# Patient Record
Sex: Female | Born: 1950 | Race: Black or African American | Hispanic: No | Smoking: Former smoker
Health system: Southern US, Community
[De-identification: ages and names within clinical notes are randomized; demographics above are authoritative.]

## PROBLEM LIST (undated history)

## (undated) DIAGNOSIS — J449 Chronic obstructive pulmonary disease, unspecified: Secondary | ICD-10-CM

## (undated) DIAGNOSIS — J45909 Unspecified asthma, uncomplicated: Secondary | ICD-10-CM

## (undated) DIAGNOSIS — E7800 Pure hypercholesterolemia, unspecified: Secondary | ICD-10-CM

## (undated) DIAGNOSIS — E119 Type 2 diabetes mellitus without complications: Secondary | ICD-10-CM

## (undated) DIAGNOSIS — M359 Systemic involvement of connective tissue, unspecified: Secondary | ICD-10-CM

## (undated) DIAGNOSIS — Z862 Personal history of diseases of the blood and blood-forming organs and certain disorders involving the immune mechanism: Secondary | ICD-10-CM

## (undated) HISTORY — PX: APPENDECTOMY: SHX54

## (undated) HISTORY — PX: OTHER SURGICAL HISTORY: SHX169

## (undated) HISTORY — PX: ABDOMINAL HYSTERECTOMY: SHX81

## (undated) HISTORY — PX: CHOLECYSTECTOMY: SHX55

---

## 2009-01-27 ENCOUNTER — Ambulatory Visit (HOSPITAL_BASED_OUTPATIENT_CLINIC_OR_DEPARTMENT_OTHER): Admission: RE | Admit: 2009-01-27 | Discharge: 2009-01-27 | Payer: Self-pay | Admitting: Family Medicine

## 2009-01-27 ENCOUNTER — Ambulatory Visit: Payer: Self-pay | Admitting: Radiology

## 2013-11-19 ENCOUNTER — Other Ambulatory Visit: Payer: Self-pay | Admitting: Hematology and Oncology

## 2013-11-19 DIAGNOSIS — N644 Mastodynia: Secondary | ICD-10-CM

## 2013-11-26 ENCOUNTER — Ambulatory Visit
Admission: RE | Admit: 2013-11-26 | Discharge: 2013-11-26 | Disposition: A | Discharge status: Home / Self Care | Payer: Medicare Other | Source: Ambulatory Visit | Attending: Hematology and Oncology | Admitting: Hematology and Oncology

## 2013-11-26 DIAGNOSIS — N644 Mastodynia: Secondary | ICD-10-CM

## 2016-08-14 ENCOUNTER — Encounter (HOSPITAL_BASED_OUTPATIENT_CLINIC_OR_DEPARTMENT_OTHER): Payer: Self-pay | Admitting: Emergency Medicine

## 2016-08-14 ENCOUNTER — Emergency Department (HOSPITAL_BASED_OUTPATIENT_CLINIC_OR_DEPARTMENT_OTHER)
Admission: EM | Admit: 2016-08-14 | Discharge: 2016-08-14 | Disposition: A | Discharge status: Home / Self Care | Payer: Medicare HMO | Attending: Emergency Medicine | Admitting: Emergency Medicine

## 2016-08-14 ENCOUNTER — Emergency Department (HOSPITAL_BASED_OUTPATIENT_CLINIC_OR_DEPARTMENT_OTHER): Payer: Medicare HMO

## 2016-08-14 DIAGNOSIS — M549 Dorsalgia, unspecified: Secondary | ICD-10-CM | POA: Diagnosis present

## 2016-08-14 DIAGNOSIS — E119 Type 2 diabetes mellitus without complications: Secondary | ICD-10-CM | POA: Insufficient documentation

## 2016-08-14 DIAGNOSIS — R6884 Jaw pain: Secondary | ICD-10-CM | POA: Insufficient documentation

## 2016-08-14 DIAGNOSIS — Z79899 Other long term (current) drug therapy: Secondary | ICD-10-CM | POA: Insufficient documentation

## 2016-08-14 DIAGNOSIS — M545 Low back pain, unspecified: Secondary | ICD-10-CM

## 2016-08-14 DIAGNOSIS — Z7984 Long term (current) use of oral hypoglycemic drugs: Secondary | ICD-10-CM | POA: Diagnosis not present

## 2016-08-14 DIAGNOSIS — G8929 Other chronic pain: Secondary | ICD-10-CM | POA: Insufficient documentation

## 2016-08-14 DIAGNOSIS — Z794 Long term (current) use of insulin: Secondary | ICD-10-CM | POA: Diagnosis not present

## 2016-08-14 DIAGNOSIS — M25561 Pain in right knee: Secondary | ICD-10-CM | POA: Insufficient documentation

## 2016-08-14 DIAGNOSIS — J449 Chronic obstructive pulmonary disease, unspecified: Secondary | ICD-10-CM | POA: Diagnosis not present

## 2016-08-14 DIAGNOSIS — R079 Chest pain, unspecified: Secondary | ICD-10-CM

## 2016-08-14 DIAGNOSIS — M25562 Pain in left knee: Secondary | ICD-10-CM | POA: Insufficient documentation

## 2016-08-14 HISTORY — DX: Chronic obstructive pulmonary disease, unspecified: J44.9

## 2016-08-14 HISTORY — DX: Pure hypercholesterolemia, unspecified: E78.00

## 2016-08-14 HISTORY — DX: Type 2 diabetes mellitus without complications: E11.9

## 2016-08-14 LAB — CBC
HCT: 40.6 % (ref 36.0–46.0)
HEMOGLOBIN: 12.2 g/dL (ref 12.0–15.0)
MCH: 23.3 pg — ABNORMAL LOW (ref 26.0–34.0)
MCHC: 30 g/dL (ref 30.0–36.0)
MCV: 77.5 fL — ABNORMAL LOW (ref 78.0–100.0)
Platelets: 239 10*3/uL (ref 150–400)
RBC: 5.24 MIL/uL — AB (ref 3.87–5.11)
RDW: 18.1 % — ABNORMAL HIGH (ref 11.5–15.5)
WBC: 12.8 10*3/uL — ABNORMAL HIGH (ref 4.0–10.5)

## 2016-08-14 LAB — COMPREHENSIVE METABOLIC PANEL
ALBUMIN: 3.1 g/dL — AB (ref 3.5–5.0)
ALK PHOS: 73 U/L (ref 38–126)
ALT: 12 U/L — AB (ref 14–54)
AST: 13 U/L — ABNORMAL LOW (ref 15–41)
Anion gap: 5 (ref 5–15)
BUN: 12 mg/dL (ref 6–20)
CALCIUM: 8.5 mg/dL — AB (ref 8.9–10.3)
CO2: 33 mmol/L — AB (ref 22–32)
CREATININE: 0.66 mg/dL (ref 0.44–1.00)
Chloride: 103 mmol/L (ref 101–111)
GFR calc Af Amer: >60 mL/min (ref >60)
GFR calc non Af Amer: >60 mL/min (ref >60)
GLUCOSE: 140 mg/dL — AB (ref 65–99)
Potassium: 4.3 mmol/L (ref 3.5–5.1)
SODIUM: 141 mmol/L (ref 135–145)
Total Bilirubin: 0.3 mg/dL (ref 0.3–1.2)
Total Protein: 6.3 g/dL — ABNORMAL LOW (ref 6.5–8.1)

## 2016-08-14 LAB — URINALYSIS, ROUTINE W REFLEX MICROSCOPIC
BILIRUBIN URINE: NEGATIVE
Glucose, UA: NEGATIVE mg/dL
HGB URINE DIPSTICK: NEGATIVE
Ketones, ur: NEGATIVE mg/dL
Leukocytes, UA: NEGATIVE
Nitrite: NEGATIVE
PH: 6.5 (ref 5.0–8.0)
Protein, ur: NEGATIVE mg/dL
SPECIFIC GRAVITY, URINE: 1.019 (ref 1.005–1.030)

## 2016-08-14 LAB — TROPONIN I
Troponin I: <0.03 ng/mL (ref <0.03)
Troponin I: <0.03 ng/mL (ref <0.03)

## 2016-08-14 MED ORDER — HYDROCODONE-ACETAMINOPHEN 5-325 MG PO TABS
1.0000 | ORAL_TABLET | Freq: Once | ORAL | Status: AC
Start: 2016-08-14 — End: 2016-08-14
  Administered 2016-08-14: 1 via ORAL
  Filled 2016-08-14: qty 1

## 2016-08-14 MED ORDER — KETOROLAC TROMETHAMINE 15 MG/ML IJ SOLN
15.0000 mg | Freq: Once | INTRAMUSCULAR | Status: AC
  Administered 2016-08-14: 15 mg via INTRAVENOUS
  Filled 2016-08-14: qty 1

## 2016-08-14 MED ORDER — METHOCARBAMOL 500 MG PO TABS
750.0000 mg | ORAL_TABLET | Freq: Once | ORAL | Status: AC
  Administered 2016-08-14: 750 mg via ORAL
  Filled 2016-08-14: qty 2

## 2016-08-14 NOTE — ED Notes (Signed)
Beside commode placed in room and pt given water to assist with encouragement of urine sample.  Family at bedside to assist patient in room.  Family will advise RN once sample provided.

## 2016-08-14 NOTE — ED Triage Notes (Signed)
Pt awoke from sleep this morning at approx 0230 with a feeling of tightness in her bilateral jaw, knees and lower back.  Pt states it feels like she just can't move them, "like they're locking up on me."

## 2016-08-14 NOTE — ED Notes (Signed)
Pt made aware to return if symptoms worsen or if any life threatening symptoms occur.   

## 2016-08-14 NOTE — ED Notes (Signed)
Pt advised that urine sample was needed for testing.  Pt states she is unable to give sample at present but will let staff know when she is able.

## 2016-08-14 NOTE — ED Provider Notes (Signed)
[facility] Provider Note   CSN: [id number] Arrival date & time: 08/14/16  5784     History   Chief Complaint Chief Complaint  Patient presents with  . Back Pain  . Muscle Pain    HPI Kelli Rivera is a 65 y.o. female.  The history is provided by the patient.  Back Pain   This is a new problem. Episode onset: Several years. The problem occurs constantly. The problem has not changed since onset.The pain is associated with no known injury. The pain is present in the lumbar spine. The quality of the pain is described as aching. The pain does not radiate. The pain is moderate. The symptoms are aggravated by bending, certain positions and twisting. Pertinent negatives include no chest pain, no fever, no weight loss, no headaches, no abdominal pain, no abdominal swelling, no bowel incontinence, no perianal numbness, no bladder incontinence, no dysuria and no paresthesias. Treatments tried: Motrin and Tylenol. The treatment provided mild relief. Risk factors include obesity.  Muscle Pain  This is a new (Bilateral mandibles) problem. The current episode started 6 to 12 hours ago. The problem occurs constantly. The problem has been gradually worsening. Pertinent negatives include no chest pain, no abdominal pain, no headaches and no shortness of breath. Exacerbated by: opening mouth. Nothing relieves the symptoms. Treatments tried: Tylenol and Motrin. The treatment provided mild relief.   Patient also endorses chronic bilateral knee pain.  Past Medical History:  Diagnosis Date  . COPD (chronic obstructive pulmonary disease) (HCC)   . Diabetes mellitus without complication (HCC)   . High cholesterol     There are no active problems to display for this patient.   Past Surgical History:  Procedure Laterality Date  . ABDOMINAL HYSTERECTOMY    . bladder tuck    . CHOLECYSTECTOMY      OB History    No data available       Home Medications    Prior to Admission  medications   Medication Sig Start Date End Date Taking? Authorizing Provider  atorvastatin (LIPITOR) 40 MG tablet Take 40 mg by mouth daily.   Yes Historical Provider, MD  furosemide (LASIX) 40 MG tablet Take 40 mg by mouth.   Yes Historical Provider, MD  Insulin Glargine (TOUJEO SOLOSTAR [place]) Inject into the skin.   Yes Historical Provider, MD  metFORMIN (GLUCOPHAGE) 1000 MG tablet Take 1,000 mg by mouth 2 (two) times daily with a meal.   Yes Historical Provider, MD    Family History No family history on file.  Social History Social History  Substance Use Topics  . Smoking status: Never Smoker  . Smokeless tobacco: Never Used  . Alcohol use No     Allergies   Review of patient's allergies indicates no known allergies.   Review of Systems Review of Systems  Constitutional: Negative for chills, fever and weight loss.  HENT: Negative for ear pain and sore throat.   Eyes: Negative for pain and visual disturbance.  Respiratory: Negative for cough and shortness of breath.   Cardiovascular: Negative for chest pain and palpitations.  Gastrointestinal: Negative for abdominal pain, bowel incontinence and vomiting.  Genitourinary: Negative for bladder incontinence, dysuria and hematuria.  Musculoskeletal: Positive for back pain and gait problem (secondary to pain). Negative for arthralgias, joint swelling, neck pain and neck stiffness.  Skin: Negative for color change and rash.  Neurological: Negative for seizures, syncope, headaches and paresthesias.  All other systems reviewed and are negative.  Physical Exam Updated Vital Signs BP 154/83 (BP Location: Right Arm)   Pulse 88   Temp 98.3 F (36.8 C) (Oral)   Resp 20   Ht 5\' 4"  (1.626 m)   Wt 290 lb (131.5 kg)   SpO2 94%   BMI 49.78 kg/m   Physical Exam  Constitutional: She is oriented to person, place, and time. She appears well-developed and well-nourished. No distress.  HENT:  Head: Normocephalic and atraumatic.    Nose: Nose normal.  Mouth/Throat: Oropharynx is clear and moist and mucous membranes are normal. No oral lesions. There is trismus in the jaw. Abnormal dentition. No dental abscesses or uvula swelling.  Bilateral masseter/parotid tenderness. Left greater than right.  Eyes: Conjunctivae and EOM are normal. Pupils are equal, round, and reactive to light. Right eye exhibits no discharge. Left eye exhibits no discharge. No scleral icterus.  Neck: Normal range of motion. Neck supple. No spinous process tenderness and no muscular tenderness present.  Cardiovascular: Normal rate and regular rhythm.  Exam reveals no gallop and no friction rub.   No murmur heard. Pulmonary/Chest: Effort normal and breath sounds normal. No stridor. No respiratory distress. She has no rales.  Abdominal: Soft. She exhibits no distension. There is no tenderness.  Musculoskeletal: She exhibits no edema.       Right knee: She exhibits no swelling, no effusion and no erythema. Tenderness found.       Left knee: She exhibits no swelling, no effusion and no erythema. Tenderness found.       Lumbar back: She exhibits tenderness. She exhibits no bony tenderness.       Back:  Neurological: She is alert and oriented to person, place, and time.  Skin: Skin is warm and dry. No rash noted. She is not diaphoretic. No erythema.  Psychiatric: She has a normal mood and affect.  Vitals reviewed.    ED Treatments / Results  Labs (all labs ordered are listed, but only abnormal results are displayed) Labs Reviewed  CBC - Abnormal; Notable for the following:       Result Value   WBC 12.8 (*)    RBC 5.24 (*)    MCV 77.5 (*)    MCH 23.3 (*)    RDW 18.1 (*)    All other components within normal limits  COMPREHENSIVE METABOLIC PANEL - Abnormal; Notable for the following:    CO2 33 (*)    Glucose, Bld 140 (*)    Calcium 8.5 (*)    Total Protein 6.3 (*)    Albumin 3.1 (*)    AST 13 (*)    ALT 12 (*)    All other components  within normal limits  TROPONIN I  URINALYSIS, ROUTINE W REFLEX MICROSCOPIC (NOT AT [facility])  TROPONIN I    EKG  EKG Interpretation  Date/Time:  Saturday August 14 2016 08:35:33 EDT Ventricular Rate:  79 PR Interval:    QRS Duration: 97 QT Interval:  409 QTC Calculation: 469 R Axis:   -26 Text Interpretation:  Sinus rhythm Borderline left axis deviation Low voltage, precordial leads Baseline wander in lead(s) I II aVR aVF No old tracing to compare Confirmed by [facility] MD, Qadir Folks [phone]) on 08/14/2016 9:25:11 AM       Radiology Dg Chest 2 View  Result Date: 08/14/2016 CLINICAL DATA:  Pt awoke from sleep this morning at approx 0230 with a feeling of tightness in her bilateral jaw, knees and lower back. Pt states it feels like she just can't  move them, "like they're locking up on me."Pt hx COPD on O2 at night, no other chest complaints at time of xray EXAM: CHEST  2 VIEW COMPARISON:  04/07/2015 FINDINGS: Cardiac silhouette is mildly enlarged. No mediastinal or hilar masses or evidence of adenopathy. Clear lungs.  No pleural effusion or pneumothorax. Skeletal structures are demineralized but grossly intact. IMPRESSION: No acute cardiopulmonary disease. Electronically Signed   By: Amie Portland M.D.   On: 08/14/2016 09:15   Dg Lumbar Spine 2-3 Views  Result Date: 08/14/2016 CLINICAL DATA:  65 year-old female awoke from sleep this morning at approx 0230 with a feeling of tightness in her bilateral jaw, knees and right lower back. Pt states it feels like she just can't move them, "like they're locking up on me" no reported injury or history of surgery. EXAM: LUMBAR SPINE - 2-3 VIEW COMPARISON:  CT, 04/26/2015 FINDINGS: No fracture.  No spondylolisthesis. Mild loss of disc height at L3-L4. Moderate loss disc height at L4-L5. Endplate osteophytes are noted at L3-L4-L4-L5. No bone lesion.  The soft tissues are unremarkable. IMPRESSION: 1. No fracture or acute finding. 2. Disc degenerative changes at  L3-L4 and L4-L5. Electronically Signed   By: Amie Portland M.D.   On: 08/14/2016 09:53    Procedures Procedures (including critical care time)  Medications Ordered in ED Medications  methocarbamol (ROBAXIN) tablet 750 mg (750 mg Oral Given 08/14/16 0837)  HYDROcodone-acetaminophen (NORCO/VICODIN) 5-325 MG per tablet 1 tablet (1 tablet Oral Given 08/14/16 0839)  ketorolac (TORADOL) 15 MG/ML injection 15 mg (15 mg Intravenous Given 08/14/16 1036)     Initial Impression / Assessment and Plan / ED Course  I have reviewed the triage vital signs and the nursing notes.  Pertinent labs & imaging results that were available during my care of the patient were reviewed by me and considered in my medical decision making (see chart for details).  Clinical Course    1. Jaw pain Masseter vs parotid tenderness. No infectious sx. Pt does have leukocytosis. No risk for CMV, HIV. No masses noted. Will rule out ACS given DM, HLd, and HTN. EKG without acute ischemia. Troponin is negative 2. Feel this is adequate to rule out ACS.  2. LBPLlumbar area since last night without signs of radicular pain. No acute traumatic onset. No red flag symptoms of fever, weight loss, saddle anesthesia, weakness, fecal/urinary incontinence or urinary retention.   Plain w/o bony lesions or compression fractures. CBC with Leukocytosis. UA w/o infection.  Suspect MSK etiology. Patient was recommended to take short course of scheduled NSAIDs and engage in early mobility as definitive treatment. Return precautions discussed for worsening or new concerning symptoms.   3. Bilateral Knee pain Chronic in nature. No acute changes.  Patient provided with by mouth Norco and IV Toradol with significant improvement in patient's symptoms.   Final Clinical Impressions(s) / ED Diagnoses   Final diagnoses:  Chest pain  Acute bilateral low back pain without sciatica  Jaw pain  Chronic pain of both knees   Disposition:  Discharge  Condition: Good  I have discussed the results, Dx and Tx plan with the patient who expressed understanding and agree(s) with the plan. Discharge instructions discussed at great length. The patient was given strict return precautions who verbalized understanding of the instructions. No further questions at time of discharge.    Current Discharge Medication List      Follow Up: Angelica Chessman, MD [address] [place] [address] [phone]  Schedule an appointment as soon as possible for a visit  in 3-5 days, If symptoms do not improve or  worsen. Should have cardiac stress test within 30 days as well      Nira Conn, MD 08/14/16 [phone]

## 2016-09-22 ENCOUNTER — Observation Stay (HOSPITAL_BASED_OUTPATIENT_CLINIC_OR_DEPARTMENT_OTHER)
Admission: EM | Admit: 2016-09-22 | Discharge: 2016-09-23 | Disposition: A | Discharge status: Home / Self Care | Payer: Medicare HMO | Attending: Internal Medicine | Admitting: Internal Medicine

## 2016-09-22 ENCOUNTER — Encounter (HOSPITAL_BASED_OUTPATIENT_CLINIC_OR_DEPARTMENT_OTHER): Payer: Self-pay

## 2016-09-22 ENCOUNTER — Emergency Department (HOSPITAL_BASED_OUTPATIENT_CLINIC_OR_DEPARTMENT_OTHER): Payer: Medicare HMO

## 2016-09-22 DIAGNOSIS — R7989 Other specified abnormal findings of blood chemistry: Secondary | ICD-10-CM | POA: Diagnosis present

## 2016-09-22 DIAGNOSIS — E872 Acidosis: Secondary | ICD-10-CM | POA: Insufficient documentation

## 2016-09-22 DIAGNOSIS — M25562 Pain in left knee: Secondary | ICD-10-CM | POA: Diagnosis not present

## 2016-09-22 DIAGNOSIS — E7800 Pure hypercholesterolemia, unspecified: Secondary | ICD-10-CM | POA: Diagnosis not present

## 2016-09-22 DIAGNOSIS — Z7982 Long term (current) use of aspirin: Secondary | ICD-10-CM | POA: Insufficient documentation

## 2016-09-22 DIAGNOSIS — E669 Obesity, unspecified: Secondary | ICD-10-CM | POA: Diagnosis not present

## 2016-09-22 DIAGNOSIS — J441 Chronic obstructive pulmonary disease with (acute) exacerbation: Secondary | ICD-10-CM | POA: Diagnosis present

## 2016-09-22 DIAGNOSIS — D72829 Elevated white blood cell count, unspecified: Secondary | ICD-10-CM

## 2016-09-22 DIAGNOSIS — L0390 Cellulitis, unspecified: Secondary | ICD-10-CM | POA: Insufficient documentation

## 2016-09-22 DIAGNOSIS — J449 Chronic obstructive pulmonary disease, unspecified: Secondary | ICD-10-CM | POA: Diagnosis not present

## 2016-09-22 DIAGNOSIS — E1169 Type 2 diabetes mellitus with other specified complication: Secondary | ICD-10-CM | POA: Diagnosis present

## 2016-09-22 DIAGNOSIS — J42 Unspecified chronic bronchitis: Secondary | ICD-10-CM

## 2016-09-22 DIAGNOSIS — Z87891 Personal history of nicotine dependence: Secondary | ICD-10-CM | POA: Diagnosis not present

## 2016-09-22 DIAGNOSIS — G8929 Other chronic pain: Secondary | ICD-10-CM | POA: Insufficient documentation

## 2016-09-22 DIAGNOSIS — R5383 Other fatigue: Secondary | ICD-10-CM | POA: Diagnosis present

## 2016-09-22 DIAGNOSIS — Z9981 Dependence on supplemental oxygen: Secondary | ICD-10-CM | POA: Insufficient documentation

## 2016-09-22 DIAGNOSIS — M25561 Pain in right knee: Secondary | ICD-10-CM | POA: Diagnosis not present

## 2016-09-22 DIAGNOSIS — Z6841 Body Mass Index (BMI) 40.0 and over, adult: Secondary | ICD-10-CM | POA: Diagnosis not present

## 2016-09-22 DIAGNOSIS — E119 Type 2 diabetes mellitus without complications: Secondary | ICD-10-CM | POA: Insufficient documentation

## 2016-09-22 DIAGNOSIS — E785 Hyperlipidemia, unspecified: Secondary | ICD-10-CM | POA: Diagnosis not present

## 2016-09-22 DIAGNOSIS — R531 Weakness: Secondary | ICD-10-CM | POA: Diagnosis present

## 2016-09-22 DIAGNOSIS — R600 Localized edema: Secondary | ICD-10-CM | POA: Insufficient documentation

## 2016-09-22 DIAGNOSIS — Z794 Long term (current) use of insulin: Secondary | ICD-10-CM | POA: Insufficient documentation

## 2016-09-22 DIAGNOSIS — Z79899 Other long term (current) drug therapy: Secondary | ICD-10-CM | POA: Diagnosis not present

## 2016-09-22 DIAGNOSIS — L03116 Cellulitis of left lower limb: Secondary | ICD-10-CM

## 2016-09-22 HISTORY — DX: Personal history of diseases of the blood and blood-forming organs and certain disorders involving the immune mechanism: Z86.2

## 2016-09-22 HISTORY — DX: Morbid (severe) obesity due to excess calories: E66.01

## 2016-09-22 LAB — COMPREHENSIVE METABOLIC PANEL
ALK PHOS: 66 U/L (ref 38–126)
ALT: 15 U/L (ref 14–54)
AST: 22 U/L (ref 15–41)
Albumin: 3.1 g/dL — ABNORMAL LOW (ref 3.5–5.0)
Anion gap: 8 (ref 5–15)
BILIRUBIN TOTAL: 0.3 mg/dL (ref 0.3–1.2)
BUN: 11 mg/dL (ref 6–20)
CO2: 28 mmol/L (ref 22–32)
CREATININE: 0.66 mg/dL (ref 0.44–1.00)
Calcium: 9.3 mg/dL (ref 8.9–10.3)
Chloride: 103 mmol/L (ref 101–111)
GFR calc Af Amer: >60 mL/min (ref >60)
GLUCOSE: 134 mg/dL — AB (ref 65–99)
Potassium: 3.8 mmol/L (ref 3.5–5.1)
Sodium: 139 mmol/L (ref 135–145)
TOTAL PROTEIN: 6.8 g/dL (ref 6.5–8.1)

## 2016-09-22 LAB — URINALYSIS, ROUTINE W REFLEX MICROSCOPIC
BILIRUBIN URINE: NEGATIVE
GLUCOSE, UA: NEGATIVE mg/dL
HGB URINE DIPSTICK: NEGATIVE
Ketones, ur: NEGATIVE mg/dL
Leukocytes, UA: NEGATIVE
Nitrite: NEGATIVE
PROTEIN: NEGATIVE mg/dL
Specific Gravity, Urine: 1.021 (ref 1.005–1.030)
pH: 8 (ref 5.0–8.0)

## 2016-09-22 LAB — CBC
HEMATOCRIT: 38.2 % (ref 36.0–46.0)
Hemoglobin: 11.5 g/dL — ABNORMAL LOW (ref 12.0–15.0)
MCH: 23.2 pg — ABNORMAL LOW (ref 26.0–34.0)
MCHC: 30.1 g/dL (ref 30.0–36.0)
MCV: 77 fL — AB (ref 78.0–100.0)
PLATELETS: 207 10*3/uL (ref 150–400)
RBC: 4.96 MIL/uL (ref 3.87–5.11)
RDW: 17.2 % — AB (ref 11.5–15.5)
WBC: 13.8 10*3/uL — ABNORMAL HIGH (ref 4.0–10.5)

## 2016-09-22 LAB — I-STAT CG4 LACTIC ACID, ED
LACTIC ACID, VENOUS: 1.15 mmol/L (ref 0.5–1.9)
Lactic Acid, Venous: 2.05 mmol/L (ref 0.5–1.9)

## 2016-09-22 LAB — GLUCOSE, CAPILLARY: GLUCOSE-CAPILLARY: 154 mg/dL — AB (ref 65–99)

## 2016-09-22 LAB — BRAIN NATRIURETIC PEPTIDE: B NATRIURETIC PEPTIDE 5: 35.1 pg/mL (ref 0.0–100.0)

## 2016-09-22 LAB — TROPONIN I: Troponin I: <0.03 ng/mL (ref <0.03)

## 2016-09-22 MED ORDER — PIPERACILLIN-TAZOBACTAM 3.375 G IVPB 30 MIN
3.3750 g | Freq: Once | INTRAVENOUS | Status: AC
  Administered 2016-09-22: 3.375 g via INTRAVENOUS
  Filled 2016-09-22 (×2): qty 50

## 2016-09-22 MED ORDER — ONDANSETRON HCL 4 MG/2ML IJ SOLN: 4.0000 mg | Freq: Four times a day (QID) | INTRAMUSCULAR | Status: DC | PRN

## 2016-09-22 MED ORDER — ALBUTEROL SULFATE (2.5 MG/3ML) 0.083% IN NEBU: 3.0000 mL | INHALATION_SOLUTION | RESPIRATORY_TRACT | Status: DC | PRN

## 2016-09-22 MED ORDER — VANCOMYCIN HCL 10 G IV SOLR
2000.0000 mg | Freq: Once | INTRAVENOUS | Status: AC
  Administered 2016-09-22: 2000 mg via INTRAVENOUS
  Filled 2016-09-22: qty 2000

## 2016-09-22 MED ORDER — SODIUM CHLORIDE 0.9 % IV SOLN
INTRAVENOUS | Status: DC
  Administered 2016-09-22: 23:00:00 via INTRAVENOUS
  Administered 2016-09-22: 15 mL/h via INTRAVENOUS

## 2016-09-22 MED ORDER — ATORVASTATIN CALCIUM 40 MG PO TABS: 40.0000 mg | ORAL_TABLET | Freq: Every day | ORAL | Status: DC

## 2016-09-22 MED ORDER — INSULIN ASPART 100 UNIT/ML ~~LOC~~ SOLN: 0.0000 [IU] | Freq: Every day | SUBCUTANEOUS | Status: DC

## 2016-09-22 MED ORDER — ONDANSETRON HCL 4 MG PO TABS: 4.0000 mg | ORAL_TABLET | Freq: Four times a day (QID) | ORAL | Status: DC | PRN

## 2016-09-22 MED ORDER — ACETAMINOPHEN 325 MG PO TABS: 650.0000 mg | ORAL_TABLET | Freq: Four times a day (QID) | ORAL | Status: DC | PRN

## 2016-09-22 MED ORDER — ASPIRIN EC 81 MG PO TBEC
81.0000 mg | DELAYED_RELEASE_TABLET | Freq: Every day | ORAL | Status: DC
  Administered 2016-09-23: 81 mg via ORAL
  Filled 2016-09-22: qty 1

## 2016-09-22 MED ORDER — INSULIN ASPART 100 UNIT/ML ~~LOC~~ SOLN
0.0000 [IU] | Freq: Three times a day (TID) | SUBCUTANEOUS | Status: DC
  Administered 2016-09-23: 3 [IU] via SUBCUTANEOUS

## 2016-09-22 MED ORDER — ENOXAPARIN SODIUM 60 MG/0.6ML ~~LOC~~ SOLN
60.0000 mg | Freq: Every day | SUBCUTANEOUS | Status: DC
  Administered 2016-09-23: 60 mg via SUBCUTANEOUS
  Filled 2016-09-22: qty 0.6

## 2016-09-22 MED ORDER — PIPERACILLIN-TAZOBACTAM 3.375 G IVPB
3.3750 g | Freq: Three times a day (TID) | INTRAVENOUS | Status: DC
  Filled 2016-09-22 (×2): qty 50

## 2016-09-22 MED ORDER — MIRABEGRON ER 50 MG PO TB24
50.0000 mg | ORAL_TABLET | Freq: Every day | ORAL | Status: DC
  Filled 2016-09-22: qty 1

## 2016-09-22 MED ORDER — TIZANIDINE HCL 4 MG PO TABS
4.0000 mg | ORAL_TABLET | Freq: Three times a day (TID) | ORAL | Status: DC
  Administered 2016-09-23 (×2): 4 mg via ORAL
  Filled 2016-09-22 (×2): qty 1

## 2016-09-22 MED ORDER — VANCOMYCIN HCL IN DEXTROSE 1-5 GM/200ML-% IV SOLN: 1000.0000 mg | Freq: Once | INTRAVENOUS | Status: DC

## 2016-09-22 MED ORDER — DOCUSATE SODIUM 100 MG PO CAPS
100.0000 mg | ORAL_CAPSULE | Freq: Two times a day (BID) | ORAL | Status: DC
  Administered 2016-09-23 (×2): 100 mg via ORAL
  Filled 2016-09-22 (×2): qty 1

## 2016-09-22 MED ORDER — MONTELUKAST SODIUM 10 MG PO TABS
10.0000 mg | ORAL_TABLET | Freq: Every day | ORAL | Status: DC
  Administered 2016-09-23: 10 mg via ORAL
  Filled 2016-09-22: qty 1

## 2016-09-22 MED ORDER — LACTATED RINGERS IV SOLN
INTRAVENOUS | Status: DC
  Administered 2016-09-23 (×2): via INTRAVENOUS

## 2016-09-22 MED ORDER — TRAMADOL HCL 50 MG PO TABS
50.0000 mg | ORAL_TABLET | Freq: Three times a day (TID) | ORAL | Status: DC
  Administered 2016-09-23 (×2): 50 mg via ORAL
  Filled 2016-09-22 (×2): qty 1

## 2016-09-22 MED ORDER — GABAPENTIN 300 MG PO CAPS
600.0000 mg | ORAL_CAPSULE | Freq: Three times a day (TID) | ORAL | Status: DC
  Administered 2016-09-23 (×2): 600 mg via ORAL
  Filled 2016-09-22 (×2): qty 2

## 2016-09-22 MED ORDER — VANCOMYCIN HCL 500 MG IV SOLR
INTRAVENOUS | Status: AC
  Filled 2016-09-22: qty 2000

## 2016-09-22 MED ORDER — MOMETASONE FURO-FORMOTEROL FUM 200-5 MCG/ACT IN AERO
2.0000 | INHALATION_SPRAY | Freq: Two times a day (BID) | RESPIRATORY_TRACT | Status: DC
  Administered 2016-09-23 (×2): 2 via RESPIRATORY_TRACT
  Filled 2016-09-22: qty 8.8

## 2016-09-22 MED ORDER — FERROUS SULFATE 325 (65 FE) MG PO TABS
325.0000 mg | ORAL_TABLET | Freq: Every day | ORAL | Status: DC
  Administered 2016-09-23: 325 mg via ORAL
  Filled 2016-09-22: qty 1

## 2016-09-22 MED ORDER — ACETAMINOPHEN 650 MG RE SUPP
650.0000 mg | Freq: Four times a day (QID) | RECTAL | Status: DC | PRN
Start: 2016-09-22 — End: 2016-09-23

## 2016-09-22 MED ORDER — PANTOPRAZOLE SODIUM 40 MG PO TBEC
80.0000 mg | DELAYED_RELEASE_TABLET | Freq: Every day | ORAL | Status: DC
  Administered 2016-09-23: 80 mg via ORAL
  Filled 2016-09-22: qty 2

## 2016-09-22 MED ORDER — SODIUM CHLORIDE 0.9 % IV SOLN
1250.0000 mg | Freq: Three times a day (TID) | INTRAVENOUS | Status: DC
  Administered 2016-09-22: 1250 mg via INTRAVENOUS
  Filled 2016-09-22 (×2): qty 1250

## 2016-09-22 MED ORDER — FLUTICASONE PROPIONATE 50 MCG/ACT NA SUSP
2.0000 | Freq: Every day | NASAL | Status: DC
  Administered 2016-09-23: 2 via NASAL
  Filled 2016-09-22: qty 16

## 2016-09-22 MED ORDER — SODIUM CHLORIDE 0.9 % IV BOLUS (SEPSIS)
1000.0000 mL | Freq: Once | INTRAVENOUS | Status: AC
  Administered 2016-09-22: 1000 mL via INTRAVENOUS

## 2016-09-22 MED ORDER — INSULIN GLARGINE 100 UNIT/ML ~~LOC~~ SOLN
70.0000 [IU] | Freq: Every day | SUBCUTANEOUS | Status: DC
  Administered 2016-09-23: 70 [IU] via SUBCUTANEOUS
  Filled 2016-09-22 (×2): qty 0.7

## 2016-09-22 NOTE — Progress Notes (Signed)
Pharmacy Antibiotic Note  Kelli Rivera is a 65 y.o. female admitted on 09/22/2016 with cellulitis.  Pharmacy has been consulted for vancomycin and Zosyn dosing.  Plan: Vancomycin 2000mg  x1 then 1250mg  IV every 8 hours.  Goal trough 15-20 mcg/mL. Zosyn 3.375g IV q8h (4 hour infusion).  Height: 5\' 4"  (162.6 cm) Weight: 288 lb (130.6 kg) IBW/kg (Calculated) : 54.7  Temp (24hrs), Avg:98.1 F (36.7 C), Min:98.1 F (36.7 C), Max:98.1 F (36.7 C)   Recent Labs Lab 09/22/16 1445 09/22/16 1454  WBC 13.8*  --   LATICACIDVEN  --  2.05*    CrCl cannot be calculated (Patient's most recent lab result is older than the maximum 21 days allowed.).    No Known Allergies  Thank you for allowing pharmacy to be a part of this patient's care.  Kelli Rivera 09/22/2016 3:19 PM

## 2016-09-22 NOTE — Progress Notes (Signed)
Rx Brief note:  Lovenox  Wt=130 kg, CrCl~92 ml/min, BMI=49  Rx adjusted Lovenox to 60mg  daily (~0.5 mg/kg) in pt with BMI>30  Thanks 09/22/2016 11:42 PM

## 2016-09-22 NOTE — H&P (Signed)
History and Physical    Kelli Rivera BPZ:[id number] DOB: December 11, 1950 DOA: 09/22/2016  PCP: [name]., MD Consultants:  Rhea Belton - pulmonology; [place] - onc Patient coming from: home - lives alone; [place]: daughter, [phone]  Chief Complaint: fatigue  HPI: Kelli Rivera is a 65 y.o. female with medical history significant of DM, COPD (on home O2), obesity presenting with fatigue.  She was also seen in the ER on 10/21 for back and muscle pain.  She reports that she has been very tired.  Has noticed for a couple of weeks.  Legs ache at night, they get stiff and are hard to move.  Has to use hands to help move legs.  Left leg with erythema, hot today.  No fever.  Eating like usual.  Trying to decrease salt intake for the last 4 days.     ED Course: Per Dr. Ashok Cordia: Iv ns. Labs.  Reviewed nursing notes and prior charts for additional history.  Exam c/w cellulitis left leg.  Patient also feels systemically ill, generally weak, no appetite, poor po intake. On labs, lactate elev, wbc elev. cxs sent.  Iv abx and fluids. Given above, hospitalists consulted for admission.  Hospitalist call pending - signed out to Dr Laverta Baltimore to admit to Hospitalist and facilitate transfer.    Review of Systems: As per HPI; otherwise 10 point review of systems reviewed and negative.   Ambulatory Status:  Uses cane sometimes  Past Medical History:  Diagnosis Date  . COPD (chronic obstructive pulmonary disease) (HCC)    O2 increased to 3L while ambulating, 2L at rest as of last week  . Diabetes mellitus without complication ([place])   . High cholesterol   . 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 Morbid obesity ([place]     Past Surgical History:  Procedure Laterality Date  . ABDOMINAL HYSTERECTOMY    . bladder tuck    . CHOLECYSTECTOMY      Social History   Social History  . Marital status: Divorced    Spouse name: N/A  . Number of children: N/A  . Years of  education: N/A   Occupational History  . Not on file.   Social History Main Topics  . Smoking status: Former Smoker    Packs/day: 1.00    Years: 42.00    Quit date: 2007  . Smokeless tobacco: Never Used  . Alcohol use No  . Drug use: No  . Sexual activity: Not on file   Other Topics Concern  . Not on file   Social History Narrative  . No narrative on file    Allergies  Allergen Reactions  . Liraglutide Nausea And Vomiting  . Pioglitazone Swelling  . Amoxicillin-Pot Clavulanate Nausea And Vomiting    Family History  Problem Relation Age of Onset  . Heart failure Mother 424 . Lung cancer Father [phone]   Prior to Admission medications   Medication Sig Start Date End Date Taking? Authorizing Provider  albuterol (PROVENTIL HFA;VENTOLIN HFA) 108 (90 Base) MCG/ACT inhaler Inhale 2 puffs into the lungs every 4 (four) hours as needed for wheezing or shortness of breath. 12/17/15  Yes Historical Provider, MD  aspirin EC 81 MG tablet Take 81 mg by mouth daily.   Yes Historical Provider, MD  atorvastatin (LIPITOR) 40 MG tablet Take 40 mg by mouth daily.   Yes Historical Provider, MD  ferrous sulfate 325 (65 FE) MG tablet Take 325 mg by  mouth daily.  09/21/16  Yes Historical Provider, MD  fluticasone (FLONASE) 50 MCG/ACT nasal spray Place 2 sprays into the nose daily.   Yes Historical Provider, MD  Fluticasone-Salmeterol (ADVAIR DISKUS) 500-50 MCG/DOSE AEPB Inhale 1 puff into the lungs every 12 (twelve) hours.   Yes Historical Provider, MD  furosemide (LASIX) 40 MG tablet Take 40 mg by mouth daily.    Yes Historical Provider, MD  gabapentin (NEURONTIN) 600 MG tablet Take 600 mg by mouth 3 (three) times daily. 09/21/16  Yes Historical Provider, MD  meloxicam (MOBIC) 15 MG tablet Take 15 mg by mouth daily. 11/07/15  Yes Historical Provider, MD  metFORMIN (GLUCOPHAGE) 1000 MG tablet Take 1,000 mg by mouth 2 (two) times daily with a meal.   Yes Historical Provider, MD  mirabegron ER  (MYRBETRIQ) 50 MG TB24 tablet Take 50 mg by mouth daily. 05/24/16  Yes Historical Provider, MD  montelukast (SINGULAIR) 10 MG tablet Take 10 mg by mouth at bedtime. 08/18/15  Yes Historical Provider, MD  Multiple Vitamins-Minerals (ALIVE WOMENS 50+ PO) Take 2 tablets by mouth daily.   Yes Historical Provider, MD  omeprazole (PRILOSEC) 40 MG capsule Take 40 mg by mouth daily. 08/31/16  Yes Historical Provider, MD  tiZANidine (ZANAFLEX) 4 MG tablet Take 4 mg by mouth 3 (three) times daily. 09/13/16  Yes Historical Provider, MD  TOUJEO SOLOSTAR 300 UNIT/ML SOPN Inject 70 Units into the skin at bedtime.  08/31/16  Yes Historical Provider, MD  traMADol (ULTRAM) 50 MG tablet Take 50 mg by mouth 3 (three) times daily. 08/16/16  Yes Historical Provider, MD  Insulin Glargine (TOUJEO SOLOSTAR [place]) Inject into the skin.    Historical Provider, MD    Physical Exam: Vitals:   09/22/16 1730 09/22/16 1800 09/22/16 2057 09/23/16 0012  BP: 120/55 129/63 (!) 127/57   Pulse: 80 79 100   Resp: '16 16 20   '$ Temp:   98.1 F (36.7 C)   TempSrc:   Oral   SpO2: 98% 99%  98%  Weight:      Height:         General: Appears calm and comfortable and is NAD, morbidly obese (288 lb) Eyes:   EOMI, normal lids, iris ENT:  grossly normal hearing, lips & tongue, mmm Neck:  no LAD, masses or thyromegaly Cardiovascular:  RRR, no m/r/g. 2+ LE edema.  Respiratory:  CTA bilaterally, no w/r/r. Normal respiratory effort. Abdomen:  soft, ntnd, NABS Skin:  no rash or induration seen on limited exam; she does have a small (about quarter-sized) round lesion on her left anterior lower leg that have very mild surrounding erythema Musculoskeletal:  grossly normal tone BUE/BLE, good ROM, no bony abnormality Psychiatric:  grossly normal mood and affect, speech fluent and appropriate, AOx3 Neurologic:  CN 2-12 grossly intact, moves all extremities in coordinated fashion, sensation intact  Labs on Admission: I have personally reviewed  following labs and imaging studies  CBC:  Recent Labs Lab 09/22/16 1445  WBC 13.8*  HGB 11.5*  HCT 38.2  MCV 77.0*  PLT 496   Basic Metabolic Panel:  Recent Labs Lab 09/22/16 1445  NA 139  K 3.8  CL 103  CO2 28  GLUCOSE 134*  BUN 11  CREATININE 0.66  CALCIUM 9.3   GFR: Estimated Creatinine Clearance: 92.9 mL/min (by C-G formula based on SCr of 0.66 mg/dL). Liver Function Tests:  Recent Labs Lab 09/22/16 1445  AST 22  ALT 15  ALKPHOS 66  BILITOT 0.3  PROT  6.8  ALBUMIN 3.1*   No results for input(s): LIPASE, AMYLASE in the last 168 hours. No results for input(s): AMMONIA in the last 168 hours. Coagulation Profile: No results for input(s): INR, PROTIME in the last 168 hours. Cardiac Enzymes:  Recent Labs Lab 09/22/16 1445  TROPONINI <0.03   BNP (last 3 results) No results for input(s): PROBNP in the last 8760 hours. HbA1C: No results for input(s): HGBA1C in the last 72 hours. CBG:  Recent Labs Lab 09/22/16 2335  GLUCAP 154*   Lipid Profile: No results for input(s): CHOL, HDL, LDLCALC, TRIG, CHOLHDL, LDLDIRECT in the last 72 hours. Thyroid Function Tests: No results for input(s): TSH, T4TOTAL, FREET4, T3FREE, THYROIDAB in the last 72 hours. Anemia Panel: No results for input(s): VITAMINB12, FOLATE, FERRITIN, TIBC, IRON, RETICCTPCT in the last 72 hours. Urine analysis:    Component Value Date/Time   COLORURINE YELLOW 09/22/2016 [place] 09/22/2016 1445   LABSPEC 1.021 09/22/2016 1445   PHURINE 8.0 09/22/2016 1445   GLUCOSEU NEGATIVE 09/22/2016 1445   HGBUR NEGATIVE 09/22/2016 1445   BILIRUBINUR NEGATIVE 09/22/2016 1445   KETONESUR NEGATIVE 09/22/2016 1445   PROTEINUR NEGATIVE 09/22/2016 1445   NITRITE NEGATIVE 09/22/2016 1445   LEUKOCYTESUR NEGATIVE 09/22/2016 1445    Creatinine Clearance: Estimated Creatinine Clearance: 92.9 mL/min (by C-G formula based on SCr of 0.66 mg/dL).  Sepsis  Labs: '@LABRCNTIP'$ (procalcitonin:4,lacticidven:4) )No results found for this or any previous visit (from the past 240 hour(s)).   Radiological Exams on Admission: Dg Chest 2 View  Result Date: 09/22/2016 CLINICAL DATA:  Cough, congestion for 2 weeks, COPD EXAM: CHEST  2 VIEW COMPARISON:  08/14/2016 FINDINGS: Cardiomediastinal silhouette is stable. Central mild vascular congestion without convincing pulmonary edema. Streaky atelectasis or early infiltrate left perihilar. IMPRESSION: No convincing pulmonary edema. Streaky atelectasis or early infiltrate left perihilar. Electronically Signed   By: Lahoma Crocker M.D.   On: 09/22/2016 15:22    EKG: Independently reviewed.  NSR with rate 81;  no evidence of acute ischemia  Assessment/Plan Principal Problem:   Fatigue Active Problems:   Leukocytosis   Morbid obesity (HCC)   COPD (chronic obstructive pulmonary disease) (HCC)   Hyperlipidemia   Diabetes mellitus type 2 in obese (HCC)   Elevated lactic acid level   Fatigue -Initial call from the ER was for sepsis due to cellulitis -The diagnosis of cellulitis is suspect - the patient has significant B LE pain rather than just the area of mild erythema, and the erythema is incredibly subtle -While it may have been more impressive prior to the first dose of antibiotics, the patient does not think it has changed -She has chronic leukocytosis (see below) and it is not currently different than her usual elevated WBC count -Her lactate was increased but this improved with hydration - and she does acknowledge not drinking enough fluids in the last day or two -Based on all of the above, a more thorough investigation of her fatigue seems warranted -Will check ESR, CRP, uric acid level, TSH, mag,phos -Continue heme consult for possible repeat bone marrow (it has been many years since this test was done) -Consider CT C/A/P -OSA could also be a consideration.  Patient could do a trial of CPAP while  here. -Will hold antibiotics for now - but if patient has fever, worsening erythema of the LE, +blood cultures, etc then would suggest Vanc/Zosyn while further evaluating.  DM -A1c was 7.8 in 5/17, indicating suboptimal control -This could be playing a role  in patient's fatigue -Hold Glucophage and Toujeo. -Will cover with SSI and check A1c  Morbid obesity -This is likely also contributing to fatigue and joint pains -Will request a nutrition consult and also PT  COPD -Continue home O2 -Continue Advair, Flonase, Albuterol  HLD -Continue Lipitor  DVT prophylaxis: Lovenox Code Status: Full - confirmed with patient/family Family Communication: Daughter present throughout evalaution Disposition Plan:  Home once clinically improved Consults called: Nutrition, PT Admission status: It is my clinical opinion that referral for OBSERVATION is reasonable and necessary in this patient based on the above information provided. The aforementioned taken together are felt to place the patient at high risk for further clinical deterioration. However it is anticipated that the patient may be medically stable for discharge from the hospital within 24 to 48 hours.    Karmen Bongo MD [facility]  If 7PM-7AM, please contact night-coverage [email or web address] Password [email or web address]  09/23/2016, 1:58 AM

## 2016-09-22 NOTE — ED Provider Notes (Addendum)
[facility] Provider Note   CSN: [id number] Arrival date & time: 09/22/16  1351     History   Chief Complaint Chief Complaint  Patient presents with  . Fatigue    HPI Kelli Rivera is a 65 y.o. female.  Patient c/o feeling extremley weak, fatigued for the past week. Symptoms constant, persistent. Weakness is generalized. No focal or unilateral numbness or weakness. Lives at home independently. On home o2 2-3 liters continuously. Compliant w normal meds. +chills for past couple days. ? fevers.  +decreased appetite/poor po intake. No trauma or fall. No syncope. Denies headache. No cough or uri c/o. No chest pain. No increase in sob. No abd pain. No vomiting or diarrhea. No dysuria or gu c/o.    The history is provided by the patient and a relative.    Past Medical History:  Diagnosis Date  . COPD (chronic obstructive pulmonary disease) (HCC)   . Diabetes mellitus without complication (HCC)   . High cholesterol     There are no active problems to display for this patient.   Past Surgical History:  Procedure Laterality Date  . ABDOMINAL HYSTERECTOMY    . bladder tuck    . CHOLECYSTECTOMY      OB History    No data available       Home Medications    Prior to Admission medications   Medication Sig Start Date End Date Taking? Authorizing Provider  GABAPENTIN PO Take by mouth.   Yes Historical Provider, MD  IRON PO Take by mouth.   Yes Historical Provider, MD  atorvastatin (LIPITOR) 40 MG tablet Take 40 mg by mouth daily.    Historical Provider, MD  furosemide (LASIX) 40 MG tablet Take 40 mg by mouth.    Historical Provider, MD  Insulin Glargine (TOUJEO SOLOSTAR [place]) Inject into the skin.    Historical Provider, MD  metFORMIN (GLUCOPHAGE) 1000 MG tablet Take 1,000 mg by mouth 2 (two) times daily with a meal.    Historical Provider, MD    Family History No family history on file.  Social History Social History  Substance Use Topics  . Smoking  status: Former Games developer  . Smokeless tobacco: Never Used  . Alcohol use No     Allergies   Patient has no known allergies.   Review of Systems Review of Systems  Constitutional: Positive for chills. Negative for fever.  HENT: Negative for sore throat.   Eyes: Negative for redness.  Respiratory: Negative for cough and shortness of breath.   Cardiovascular: Negative for chest pain.  Gastrointestinal: Negative for abdominal pain and vomiting.  Genitourinary: Negative for flank pain.  Musculoskeletal: Negative for back pain and neck pain.  Skin: Negative for rash.  Neurological: Positive for weakness. Negative for numbness and headaches.  Hematological: Does not bruise/bleed easily.  Psychiatric/Behavioral: Negative for confusion.     Physical Exam Updated Vital Signs BP 133/67 (BP Location: Right Arm)   Pulse 82   Temp 98.1 F (36.7 C) (Oral)   Resp 24   Ht 5\' 4"  (1.626 m)   Wt 130.6 kg   SpO2 99%   BMI 49.44 kg/m   Physical Exam  Constitutional: She appears well-developed and well-nourished. No distress.  HENT:  Mouth/Throat: Oropharynx is clear and moist.  Eyes: Conjunctivae are normal. No scleral icterus.  Neck: Neck supple. No tracheal deviation present.  Cardiovascular: Normal rate, regular rhythm, normal heart sounds and intact distal pulses.  Exam reveals no friction rub.  No murmur heard. Pulmonary/Chest: Effort normal and breath sounds normal. No respiratory distress.  Abdominal: Soft. Normal appearance and bowel sounds are normal. She exhibits no distension. There is no tenderness.  obese  Genitourinary:  Genitourinary Comments: No cva tenderness  Musculoskeletal: She exhibits edema.  Bilateral lower leg edema. On left lower leg, anterior erythema and increased warmth, c/w cellulitis.   Neurological: She is alert.  Skin: Skin is warm and dry. No rash noted. She is not diaphoretic. There is erythema.  Psychiatric: She has a normal mood and affect.    Nursing note and vitals reviewed.    ED Treatments / Results  Labs (all labs ordered are listed, but only abnormal results are displayed) Results for orders placed or performed during the hospital encounter of 09/22/16  CBC  Result Value Ref Range   WBC 13.8 (H) 4.0 - 10.5 K/uL   RBC 4.96 3.87 - 5.11 MIL/uL   Hemoglobin 11.5 (L) 12.0 - 15.0 g/dL   HCT 74.1 28.7 - 86.7 %   MCV 77.0 (L) 78.0 - 100.0 fL   MCH 23.2 (L) 26.0 - 34.0 pg   MCHC 30.1 30.0 - 36.0 g/dL   RDW 67.2 (H) 09.4 - 70.9 %   Platelets 207 150 - 400 K/uL  Comprehensive metabolic panel  Result Value Ref Range   Sodium 139 135 - 145 mmol/L   Potassium 3.8 3.5 - 5.1 mmol/L   Chloride 103 101 - 111 mmol/L   CO2 28 22 - 32 mmol/L   Glucose, Bld 134 (H) 65 - 99 mg/dL   BUN 11 6 - 20 mg/dL   Creatinine, Ser 6.28 0.44 - 1.00 mg/dL   Calcium 9.3 8.9 - 36.6 mg/dL   Total Protein 6.8 6.5 - 8.1 g/dL   Albumin 3.1 (L) 3.5 - 5.0 g/dL   AST 22 15 - 41 U/L   ALT 15 14 - 54 U/L   Alkaline Phosphatase 66 38 - 126 U/L   Total Bilirubin 0.3 0.3 - 1.2 mg/dL   GFR calc non Af Amer >60 >60 mL/min   GFR calc Af Amer >60 >60 mL/min   Anion gap 8 5 - 15  Urinalysis, Routine w reflex microscopic (not at [facility])  Result Value Ref Range   Color, Urine YELLOW YELLOW   APPearance CLEAR CLEAR   Specific Gravity, Urine 1.021 1.005 - 1.030   pH 8.0 5.0 - 8.0   Glucose, UA NEGATIVE NEGATIVE mg/dL   Hgb urine dipstick NEGATIVE NEGATIVE   Bilirubin Urine NEGATIVE NEGATIVE   Ketones, ur NEGATIVE NEGATIVE mg/dL   Protein, ur NEGATIVE NEGATIVE mg/dL   Nitrite NEGATIVE NEGATIVE   Leukocytes, UA NEGATIVE NEGATIVE  Brain natriuretic peptide  Result Value Ref Range   B Natriuretic Peptide 35.1 0.0 - 100.0 pg/mL  Troponin I  Result Value Ref Range   Troponin I <0.03 <0.03 ng/mL  I-Stat CG4 Lactic Acid, ED  Result Value Ref Range   Lactic Acid, Venous 2.05 (HH) 0.5 - 1.9 mmol/L   Comment NOTIFIED PHYSICIAN    Dg Chest 2 View  Result  Date: 09/22/2016 CLINICAL DATA:  Cough, congestion for 2 weeks, COPD EXAM: CHEST  2 VIEW COMPARISON:  08/14/2016 FINDINGS: Cardiomediastinal silhouette is stable. Central mild vascular congestion without convincing pulmonary edema. Streaky atelectasis or early infiltrate left perihilar. IMPRESSION: No convincing pulmonary edema. Streaky atelectasis or early infiltrate left perihilar. Electronically Signed   By: Natasha Mead M.D.   On: 09/22/2016 15:22    EKG  EKG Interpretation None       Radiology No results found.  Procedures Procedures (including critical care time)  Medications Ordered in ED Medications  0.9 %  sodium chloride infusion (not administered)     Initial Impression / Assessment and Plan / ED Course  I have reviewed the triage vital signs and the nursing notes.  Pertinent labs & imaging results that were available during my care of the patient were reviewed by me and considered in my medical decision making (see chart for details).  Clinical Course     Iv ns. Labs.  Reviewed nursing notes and prior charts for additional history.   Exam c/w cellulitis left leg.  Patient also feels systemically ill, generally weak, no appetite, poor po intake.  On labs, lactate elev, wbc elev.  cxs sent.   Iv abx and fluids.  Given above, hospitalists consulted for admission.  Hospitalist call pending - signed out to Dr Jacqulyn Bath to admit to Hospitalist and facilitate transfer.     Final Clinical Impressions(s) / ED Diagnoses   Final diagnoses:  None    New Prescriptions New Prescriptions   No medications on file             Cathren Laine, MD 09/22/16 1624

## 2016-09-22 NOTE — ED Notes (Signed)
ED Provider at bedside discussing test results and dispo plan of care. 

## 2016-09-22 NOTE — Progress Notes (Addendum)
Kelli Rivera, Peaden Female, 65 y.o., 11-01-1950  [name] is a 54yr female with insulin dependent dm2, copd (home o2 dependent), obesity presented to [facility] with weakness, poor appetite, chills and subjective fever, found to have left lower leg cellulitis.   Per EDP, patient vital is stable, respiratory status at baseline, labs with leukocytosis, mild lactic acidosis, blood culture pending, she is treated with vanc/zosyn, due to multiple comorbidities hospitalization requested, Patient is accepted to [facility] med surg under observation status.  Please page [facility] admission upon patient's arrival.

## 2016-09-22 NOTE — ED Triage Notes (Signed)
C/o fatigue and increase in SOB-pt on 02 3L [place]-taken to tx room in w/c-was able to stand and take steps to stretcher

## 2016-09-22 NOTE — ED Provider Notes (Signed)
Blood pressure (!) 110/54, pulse 77, temperature 98.1 F (36.7 C), temperature source Oral, resp. rate 18, height 5\' 4"  (1.626 m), weight 288 lb (130.6 kg), SpO2 100 %.  Assuming care from Dr. .  In short, Kelli Rivera is a 65 y.o. female with a chief complaint of Fatigue .  Refer to the original H&P for additional details.  The current plan of care is to speak with hospitalist regarding admission for cellulitis.  04:45 PM  Discussed patient's case with hospitalist, Dr. 71.  Recommend admission to med-surg, obs bed.  I will place holding orders per their request. Patient and family (if present) updated with plan. Care transferred to hospitalist service.  I reviewed all nursing notes, vitals, pertinent old records, EKGs, labs, imaging (as available).  Roda Shutters, MD   Alona Bene, MD 09/22/16 [phone]

## 2016-09-22 NOTE — Progress Notes (Signed)
Paged hospitalist and made aware of patients arrival to unit. Awaiting orders.

## 2016-09-23 DIAGNOSIS — E669 Obesity, unspecified: Secondary | ICD-10-CM

## 2016-09-23 DIAGNOSIS — Z9981 Dependence on supplemental oxygen: Secondary | ICD-10-CM

## 2016-09-23 DIAGNOSIS — R531 Weakness: Secondary | ICD-10-CM | POA: Diagnosis present

## 2016-09-23 DIAGNOSIS — D72829 Elevated white blood cell count, unspecified: Secondary | ICD-10-CM | POA: Diagnosis present

## 2016-09-23 DIAGNOSIS — J9611 Chronic respiratory failure with hypoxia: Secondary | ICD-10-CM

## 2016-09-23 DIAGNOSIS — R7989 Other specified abnormal findings of blood chemistry: Secondary | ICD-10-CM | POA: Diagnosis not present

## 2016-09-23 DIAGNOSIS — R5382 Chronic fatigue, unspecified: Secondary | ICD-10-CM

## 2016-09-23 DIAGNOSIS — E1169 Type 2 diabetes mellitus with other specified complication: Secondary | ICD-10-CM

## 2016-09-23 DIAGNOSIS — J441 Chronic obstructive pulmonary disease with (acute) exacerbation: Secondary | ICD-10-CM | POA: Diagnosis present

## 2016-09-23 DIAGNOSIS — R5383 Other fatigue: Secondary | ICD-10-CM | POA: Diagnosis not present

## 2016-09-23 DIAGNOSIS — E785 Hyperlipidemia, unspecified: Secondary | ICD-10-CM | POA: Diagnosis present

## 2016-09-23 LAB — CBC
HCT: 39.1 % (ref 36.0–46.0)
HEMOGLOBIN: 11.5 g/dL — AB (ref 12.0–15.0)
MCH: 23.3 pg — AB (ref 26.0–34.0)
MCHC: 29.4 g/dL — ABNORMAL LOW (ref 30.0–36.0)
MCV: 79.3 fL (ref 78.0–100.0)
Platelets: 170 10*3/uL (ref 150–400)
RBC: 4.93 MIL/uL (ref 3.87–5.11)
RDW: 17.5 % — ABNORMAL HIGH (ref 11.5–15.5)
WBC: 11.5 10*3/uL — AB (ref 4.0–10.5)

## 2016-09-23 LAB — BASIC METABOLIC PANEL
ANION GAP: 8 (ref 5–15)
BUN: 8 mg/dL (ref 6–20)
CALCIUM: 8.6 mg/dL — AB (ref 8.9–10.3)
CO2: 26 mmol/L (ref 22–32)
Chloride: 107 mmol/L (ref 101–111)
Creatinine, Ser: 0.69 mg/dL (ref 0.44–1.00)
Glucose, Bld: 154 mg/dL — ABNORMAL HIGH (ref 65–99)
Potassium: 4.4 mmol/L (ref 3.5–5.1)
SODIUM: 141 mmol/L (ref 135–145)

## 2016-09-23 LAB — PHOSPHORUS: PHOSPHORUS: 3.6 mg/dL (ref 2.5–4.6)

## 2016-09-23 LAB — MAGNESIUM: MAGNESIUM: 1.8 mg/dL (ref 1.7–2.4)

## 2016-09-23 LAB — SEDIMENTATION RATE: Sed Rate: 27 mm/hr — ABNORMAL HIGH (ref 0–22)

## 2016-09-23 LAB — C-REACTIVE PROTEIN

## 2016-09-23 LAB — GLUCOSE, CAPILLARY
Glucose-Capillary: 113 mg/dL — ABNORMAL HIGH (ref 65–99)
Glucose-Capillary: 163 mg/dL — ABNORMAL HIGH (ref 65–99)

## 2016-09-23 LAB — URIC ACID: URIC ACID, SERUM: 6.3 mg/dL (ref 2.3–6.6)

## 2016-09-23 LAB — TSH: TSH: 1.614 u[IU]/mL (ref 0.350–4.500)

## 2016-09-23 MED ORDER — DOXYCYCLINE HYCLATE 100 MG PO CAPS: 100.0000 mg | ORAL_CAPSULE | Freq: Two times a day (BID) | ORAL | 0 refills | Status: DC

## 2016-09-23 NOTE — Progress Notes (Signed)
Nutrition Brief Note  Received consult for nutrition assessment.   Wt Readings from Last 15 Encounters:  09/22/16 288 lb (130.6 kg)  08/14/16 290 lb (131.5 kg)    Body mass index is 49.44 kg/m. Patient meets criteria for morbid obesity based on current BMI.   Current diet order is carb controlled, patient is consuming approximately 100% of meals at this time. Labs and medications reviewed.   Met with patient in room today. Patient reports eating 100% meals with good appetite. Pt reports poor appetite for 2 days pta but back to normal now. Pt reports stable weights. Spoke to pt about reducing salt intake and gave examples of foods with hidden salt. Advised pt not to add salt to food and gave some alternative options for seasoning foods. Spoke to pt about the importance of weight loss.   No nutrition interventions warranted at this time. If nutrition issues arise, please consult RD.   Koleen Distance, RD, LDN

## 2016-09-23 NOTE — Progress Notes (Signed)
Discharge planning, spoke with patient at beside. Chose [facility] for [facility] services, contacted [facility] for referral. RW and 3-n-1 delivered to room. [phone]

## 2016-09-23 NOTE — Discharge Summary (Signed)
Discharge Summary  Kelli Rivera BJS:[id number] DOB: 01/22/1951  PCP: [name]., MD  Admit date: 09/22/2016 Discharge date: 09/23/2016  Time spent: <52mns  Recommendations for Outpatient Follow-up:  1. F/u with PMD within a week  for hospital discharge follow up, repeat cbc/bmp at follow up, pmd to continue monitor blood sugar and blood pressure,  2. pmd to follow up on final blood culture result ( [facility]   Discharge Diagnoses:  Active Hospital Problems   Diagnosis Date Noted  . Fatigue 09/23/2016  . Leukocytosis 09/23/2016  . Morbid obesity ([place] 09/23/2016  . COPD (chronic obstructive pulmonary disease) ([place] 09/23/2016  . Hyperlipidemia 09/23/2016  . Diabetes mellitus type 2 in obese ([place] 09/23/2016  . Elevated lactic acid level 09/23/2016    Resolved Hospital Problems   Diagnosis Date Noted Date Resolved  No resolved problems to display.    Discharge Condition: stable  Diet recommendation: heart healthy/carb modified  Filed Weights   09/22/16 1406  Weight: 130.6 kg (288 lb)    History of present illness:  Consultants:  ERhea [place]- pulmonology; [place] - onc Patient coming from: home - [place]; [place] daughter, [phone] Chief Complaint: fatigue  HPI: Kelli LAUis a 65y.o. female with medical history significant of DM, COPD (on home O2), obesity presenting with fatigue.  She was also seen in the ER on 10/21 for back and muscle pain.  She reports that she has been very tired.  Has noticed for a couple of weeks.  Legs ache at night, they get stiff and are hard to move.  Has to use hands to help move legs.  Left leg with erythema, hot today.  No fever.  Eating like usual.  Trying to decrease salt intake for the last 4 days.     ED Course: Per Dr. SAshok Cordia Iv ns. Labs.  Reviewed nursing notes and prior charts for additional history.  Exam c/w cellulitis left leg.  Patient also feels systemically ill, generally  weak, no appetite, poor po intake. On labs, lactate elev, wbc elev. cxs sent.  Iv abx and fluids. Given above, hospitalists consulted for admission.   signed out to Dr LLaverta Baltimoreto admit to Hospitalist and facilitate transfer.    Hospital Course:  Principal Problem:   Fatigue Active Problems:   Leukocytosis   Morbid obesity (HCC)   COPD (chronic obstructive pulmonary disease) (HCC)   Hyperlipidemia   Diabetes mellitus type 2 in obese (HCC)   Elevated lactic acid level   Fatigue -report she presented to the ED due to progressive fatigue,  -she report stop driving two months ago, she has been confined to her house due fatigue for the last two months, she does not report weight loss, she does report chronic knee pain ( s/p bilateral knee injection a months ago)  ,she denies back pain, she does has urinary urgency but denies incontinence with bowel or bladder, denies decreased sensation in lower extremities -labs unremarkable, ESR 27, CRP 0.8, uric acid level wnl, TSH 1.6 mag 1.8 ,phos 3.6 --OSA could also be a consideration. Patient reports she used to use cpap but was told to stop using it a year ago, she does endorse, day time sleepiness. She is advised to follow up with her pulmonologist to discuss resuming cpap at night.  Mild lactic acidosis, initial concern for left lower extremity cellulitis, she received vanc and zosyn while she is in the ED Lactic acid 2.05 in the ED, repeat lactic  acid 1.15 On exam she does has chronic lower extremity edema, mild erythematous , warm and tender to touch anterior shin above left ankle, she is discharged on oral doxycyclin, and pmd follow up  Mild leukocytosis, but seems chronic, dated back to at least 2015, continue follow up with pmd, consider hematology referral by pmd  Insulin dependent DM2 -A1c was 7.8 in 5/17, indicating suboptimal control -This could be playing a role in patient's fatigue -Glucophage and Toujeo held in the hospital, resumed at  discharge -she received ssi in the hospital,  Blood glucose range from 113 to 163 -A1c pending  Chronic bilateral lower extremity edema,  Which could also contribute to fatigue she take lasixs, she report edema has improved with her trying to limit salt intake She is instructed to continue follow with pmd  Morbid obesity Body mass index is 49.44 kg/m. -This is likely also contributing to fatigue and joint pains -nutrition and PT consult appreciated  COPD, home o2 dependent -Continue home O2 -Continue Advair, Flonase, Albuterol  HLD -Continue Lipitor  DVT prophylaxis while in the hospital: Lovenox Code Status: Full - confirmed with patient/family Family Communication: patient and granddaughter  And daughter at bedside Disposition Plan:  Home with home health Consults called: Nutrition, PT, case manager  Procedures: none   Discharge Exam: BP (!) 119/50 (BP Location: Right Arm)   Pulse 72   Temp 97.7 F (36.5 C) (Oral)   Resp 18   Ht '5\' 4"'$  (1.626 m)   Wt 130.6 kg (288 lb)   SpO2 98%   BMI 49.44 kg/m   General: obese, NAD Cardiovascular: RRR Respiratory: CTABL Extremity: pitting edema bilateral leg, report this is much better than a few days ago   Discharge Instructions You were cared for by a hospitalist during your hospital stay. If you have any questions about your discharge medications or the care you received while you were in the hospital after you are discharged, you can call the unit and asked to speak with the hospitalist on call if the hospitalist that took care of you is not available. Once you are discharged, your primary care physician will handle any further medical issues. Please note that NO REFILLS for any discharge medications will be authorized once you are discharged, as it is imperative that you return to your primary care physician (or establish a relationship with a primary care physician if you do not have one) for your aftercare needs so  that they can reassess your need for medications and monitor your lab values.  Discharge Instructions    Diet - low sodium heart healthy    Complete by:  As directed    Low fat, carb modified   Increase activity slowly    Complete by:  As directed        Medication List    TAKE these medications   ADVAIR DISKUS 500-50 MCG/DOSE Aepb Generic drug:  Fluticasone-Salmeterol Inhale 1 puff into the lungs every 12 (twelve) hours.   albuterol 108 (90 Base) MCG/ACT inhaler Commonly known as:  PROVENTIL HFA;VENTOLIN HFA Inhale 2 puffs into the lungs every 4 (four) hours as needed for wheezing or shortness of breath.   ALIVE WOMENS 50+ PO Take 2 tablets by mouth daily.   aspirin EC 81 MG tablet Take 81 mg by mouth daily.   atorvastatin 40 MG tablet Commonly known as:  LIPITOR Take 40 mg by mouth daily.   doxycycline 100 MG capsule Commonly known as:  VIBRAMYCIN Take 1 capsule (  100 mg total) by mouth 2 (two) times daily.   ferrous sulfate 325 (65 FE) MG tablet Take 325 mg by mouth daily.   fluticasone 50 MCG/ACT nasal spray Commonly known as:  FLONASE Place 2 sprays into the nose daily.   furosemide 40 MG tablet Commonly known as:  LASIX Take 40 mg by mouth daily.   gabapentin 600 MG tablet Commonly known as:  NEURONTIN Take 600 mg by mouth 3 (three) times daily.   meloxicam 15 MG tablet Commonly known as:  MOBIC Take 15 mg by mouth daily.   metFORMIN 1000 MG tablet Commonly known as:  GLUCOPHAGE Take 1,000 mg by mouth 2 (two) times daily with a meal.   montelukast 10 MG tablet Commonly known as:  SINGULAIR Take 10 mg by mouth at bedtime.   MYRBETRIQ 50 MG Tb24 tablet Generic drug:  mirabegron ER Take 50 mg by mouth daily.   omeprazole 40 MG capsule Commonly known as:  PRILOSEC Take 40 mg by mouth daily.   tiZANidine 4 MG tablet Commonly known as:  ZANAFLEX Take 4 mg by mouth 3 (three) times daily.   TOUJEO SOLOSTAR 300 UNIT/ML Sopn Generic drug:   Insulin Glargine Inject 70 Units into the skin at bedtime.   traMADol 50 MG tablet Commonly known as:  ULTRAM Take 50 mg by mouth 3 (three) times daily.            Durable Medical Equipment        Start     Ordered   09/23/16 1112  For home use only DME 3 n 1  Once     09/23/16 1113   09/23/16 1112  For home use only DME Walker rolling  Once    Question:  Patient needs a walker to treat with the following condition  Answer:  COPD (chronic obstructive pulmonary disease) ([place])   09/23/16 1113     Allergies  Allergen Reactions  . Liraglutide Nausea And Vomiting  . Pioglitazone Swelling  . Amoxicillin-Pot Clavulanate Nausea And Vomiting   Follow-up Information    [name]., MD Follow up in 1 week(s).   Specialty:  Family Medicine Why:  hospital discharge follow up, repeat cbc/bmp at follow up,  pmd to monitor bilateral lower leg edema, blood sugar control and blood pressure. Contact information: [address] [place] [address] [phone]        please continue to follow with your pulmonologist Follow up.        please continue to follow with your orthopedic surgery for bilateral knee pain Follow up.        [facility] Follow up.   Why:  physical therapy Contact information: [place] [address] [place] Follow up.   Why:  bedside commode, rolling walker Contact information: [address] [place] [address] [phone]            The results of significant diagnostics from this hospitalization (including imaging, microbiology, ancillary and laboratory) are listed below for reference.    Significant Diagnostic Studies: Dg Chest 2 View  Result Date: 09/22/2016 CLINICAL DATA:  Cough, congestion for 2 weeks, COPD EXAM: CHEST  2 VIEW COMPARISON:  08/14/2016 FINDINGS: Cardiomediastinal silhouette is stable. Central mild vascular  congestion without convincing pulmonary edema. Streaky atelectasis or early infiltrate left perihilar. IMPRESSION: No convincing pulmonary edema. Streaky atelectasis or early infiltrate left perihilar.  Electronically Signed   By: Lahoma Crocker M.D.   On: 09/22/2016 15:22    Microbiology: No results found for this or any previous visit (from the past 240 hour(s)).   Labs: Basic Metabolic Panel:  Recent Labs Lab 09/22/16 1445 09/23/16 0835  NA 139 141  K 3.8 4.4  CL 103 107  CO2 28 26  GLUCOSE 134* 154*  BUN 11 8  CREATININE 0.66 0.69  CALCIUM 9.3 8.6*  MG  --  1.8  PHOS  --  3.6   Liver Function Tests:  Recent Labs Lab 09/22/16 1445  AST 22  ALT 15  ALKPHOS 66  BILITOT 0.3  PROT 6.8  ALBUMIN 3.1*   No results for input(s): LIPASE, AMYLASE in the last 168 hours. No results for input(s): AMMONIA in the last 168 hours. CBC:  Recent Labs Lab 09/22/16 1445 09/23/16 0835  WBC 13.8* 11.5*  HGB 11.5* 11.5*  HCT 38.2 39.1  MCV 77.0* 79.3  PLT 207 170   Cardiac Enzymes:  Recent Labs Lab 09/22/16 1445  TROPONINI <0.03   BNP: BNP (last 3 results)  Recent Labs  09/22/16 1145  BNP 35.1    ProBNP (last 3 results) No results for input(s): PROBNP in the last 8760 hours.  CBG:  Recent Labs Lab 09/22/16 2335 09/23/16 0742 09/23/16 1144  GLUCAP 154* 113* 163*       Signed:  Alvino Lechuga MD, PhD  [facility] 09/23/2016, 3:16 PM

## 2016-09-23 NOTE — Progress Notes (Signed)
Assessment unchanged.  Scripts were given per MD order.  All questions pertaining to D/C we answered.  Pt was D/C'd via wheelchair and accompanied by NT.  Mujtaba Bollig L  

## 2016-09-23 NOTE — Evaluation (Signed)
Physical Therapy Evaluation Patient Details Name: Kelli Rivera MRN: [id number] DOB: 1951/03/03 Today's Date: 09/23/2016   History of Present Illness  65 yo female admitted with fatigue. Hx of DM, COPD, morbid obesity.   Clinical Impression  On eval, pt required Min assist to stand and Min assist (+2 for safety) for ambulation. Ambulated on 3L O2 [place]-pt reported she wears 3L at home when mobilizing. Pt presents with general weakness, decreased activity tolerance, and impaired gait and balance. She also reports bil knee pain rated 7/10. Attempted ambulation with just 1 point of support but pt was unable to safely ambulate. For this reason, I recommended RW use for safe ambulation until mobility improves. Discussed d/c plan-she states she will return home. She is agreeable to HHPT follow up as well. Will follow during hospital stay and progress activity as tolerated.    Follow Up Recommendations Home health PT;Supervision - Intermittent    Equipment Recommendations  Rolling walker with 5" wheels (wide, if possible)    Recommendations for Other Services       Precautions / Restrictions Precautions Precautions: Fall Precaution Comments: O2 dep Restrictions Weight Bearing Restrictions: No      Mobility  Bed Mobility               General bed mobility comments: oob in recliner  Transfers Overall transfer level: Needs assistance   Transfers: Sit to/from Stand Sit to Stand: Min assist         General transfer comment: Assist to rise, control descent. External assist required even with pt using bil armrests.   Ambulation/Gait Ambulation/Gait assistance: Min assist;+2 safety/equipment Ambulation Distance (Feet): 55 Feet Assistive device: 1 person hand held assist + (IV pole) Gait Pattern/deviations: Step-through pattern;Decreased stride length;Antalgic     General Gait Details: slow gait speed. Noted pt to "furniture walk" even with use of IV pole on one side. Once in  hallway, daughter provided 1 HHA on opposite side. Pt fatigues fairly easily. O2 sats 92% on 3L O2.   Stairs            Wheelchair Mobility    Modified Rankin (Stroke Patients Only)       Balance Overall balance assessment: Needs assistance           Standing balance-Leahy Scale: Poor Standing balance comment: requires external support                             Pertinent Vitals/Pain Pain Assessment: 0-10 Pain Score: 7  Pain Location: bil knees Pain Descriptors / Indicators: Sore;Aching Pain Intervention(s): Monitored during session;Repositioned    Home Living Family/patient expects to be discharged to:: Private residence Living Arrangements: Alone Available Help at Discharge: Family Type of Home: Apartment Home Access: Level entry     Home Layout: One level Home Equipment: Cane - single point      Prior Function Level of Independence: Independent with assistive device(s)         Comments: using cane PRN     Hand Dominance        Extremity/Trunk Assessment               Lower Extremity Assessment: Generalized weakness      Cervical / Trunk Assessment: Normal  Communication   Communication: No difficulties  Cognition Arousal/Alertness: Awake/alert Behavior During Therapy: WFL for tasks assessed/performed Overall Cognitive Status: Within Functional Limits for tasks assessed  General Comments      Exercises     Assessment/Plan    PT Assessment Patient needs continued PT services  PT Problem List Decreased strength;Decreased mobility;Decreased activity tolerance;Decreased balance;Pain;Decreased knowledge of use of DME;Obesity          PT Treatment Interventions DME instruction;Gait training;Therapeutic exercise;Patient/family education;Therapeutic activities;Functional mobility training;Balance training    PT Goals (Current goals can be found in the Care Plan section)  Acute Rehab PT  Goals Patient Stated Goal: less pain. to not feel so tired PT Goal Formulation: With patient/family Time For Goal Achievement: 10/07/16 Potential to Achieve Goals: Good    Frequency Min 3X/week   Barriers to discharge        Co-evaluation               End of Session Equipment Utilized During Treatment: Oxygen Activity Tolerance: Patient limited by fatigue;Patient limited by pain Patient left: in bed;with call bell/phone within reach;with family/visitor present (sitting at EOB with daughter)      Functional Assessment Tool Used: clinical judgement Functional Limitation: Mobility: Walking and moving around Mobility: Walking and Moving Around Current Status (G8366): At least 20 percent but less than 40 percent impaired, limited or restricted Mobility: Walking and Moving Around Goal Status [phone]): At least 1 percent but less than 20 percent impaired, limited or restricted    Time: 5465-0354 PT Time Calculation (min) (ACUTE ONLY): 9 min   Charges:   PT Evaluation $PT Eval Low Complexity: 1 Procedure     PT G Codes:   PT G-Codes **NOT FOR INPATIENT CLASS** Functional Assessment Tool Used: clinical judgement Functional Limitation: Mobility: Walking and moving around Mobility: Walking and Moving Around Current Status (S5681): At least 20 percent but less than 40 percent impaired, limited or restricted Mobility: Walking and Moving Around Goal Status [phone]): At least 1 percent but less than 20 percent impaired, limited or restricted    Rebeca Alert, MPT Pager: [phone]

## 2016-09-23 NOTE — Progress Notes (Addendum)
Spoke with patient and daughter at bedside. Patient lives at home alone but daughter helps out frequently. Patient is independent of ADLs, daughter provides transportation and assist with meals. Patient states she has a cane, home O2, would benefit from a walker and 3n1. She is agreeable to [facility] PT, does not feel she needs other services, is able to manage meds without issue. Has  PCP she follows in the community. Will await PT recommendations.

## 2016-09-23 NOTE — Progress Notes (Signed)
Discharge instructions given to patient and granddaughter at bedside.  Questions answered.

## 2016-09-23 NOTE — Care Management Obs Status (Signed)
MEDICARE OBSERVATION STATUS NOTIFICATION   Patient Details  Name: Kelli Rivera MRN: [id number] Date of Birth: May 07, 1951   Medicare Observation Status Notification Given:  Yes    Alexis Goodell, RN 09/23/2016, 11:05 AM

## 2016-09-24 LAB — HEMOGLOBIN A1C
Hgb A1c MFr Bld: 7.7 % — ABNORMAL HIGH (ref 4.8–5.6)
Mean Plasma Glucose: 174 mg/dL

## 2016-09-27 LAB — CULTURE, BLOOD (ROUTINE X 2)
CULTURE: NO GROWTH
Culture: NO GROWTH

## 2016-12-07 IMAGING — CR DG CHEST 2V
2 series · 2 of 2 positions shown · non-contrast
Comparison: 08/14/2016

CLINICAL DATA: Cough, congestion for 2 weeks, COPD

EXAM:
CHEST  2 VIEW

[w chest lat]
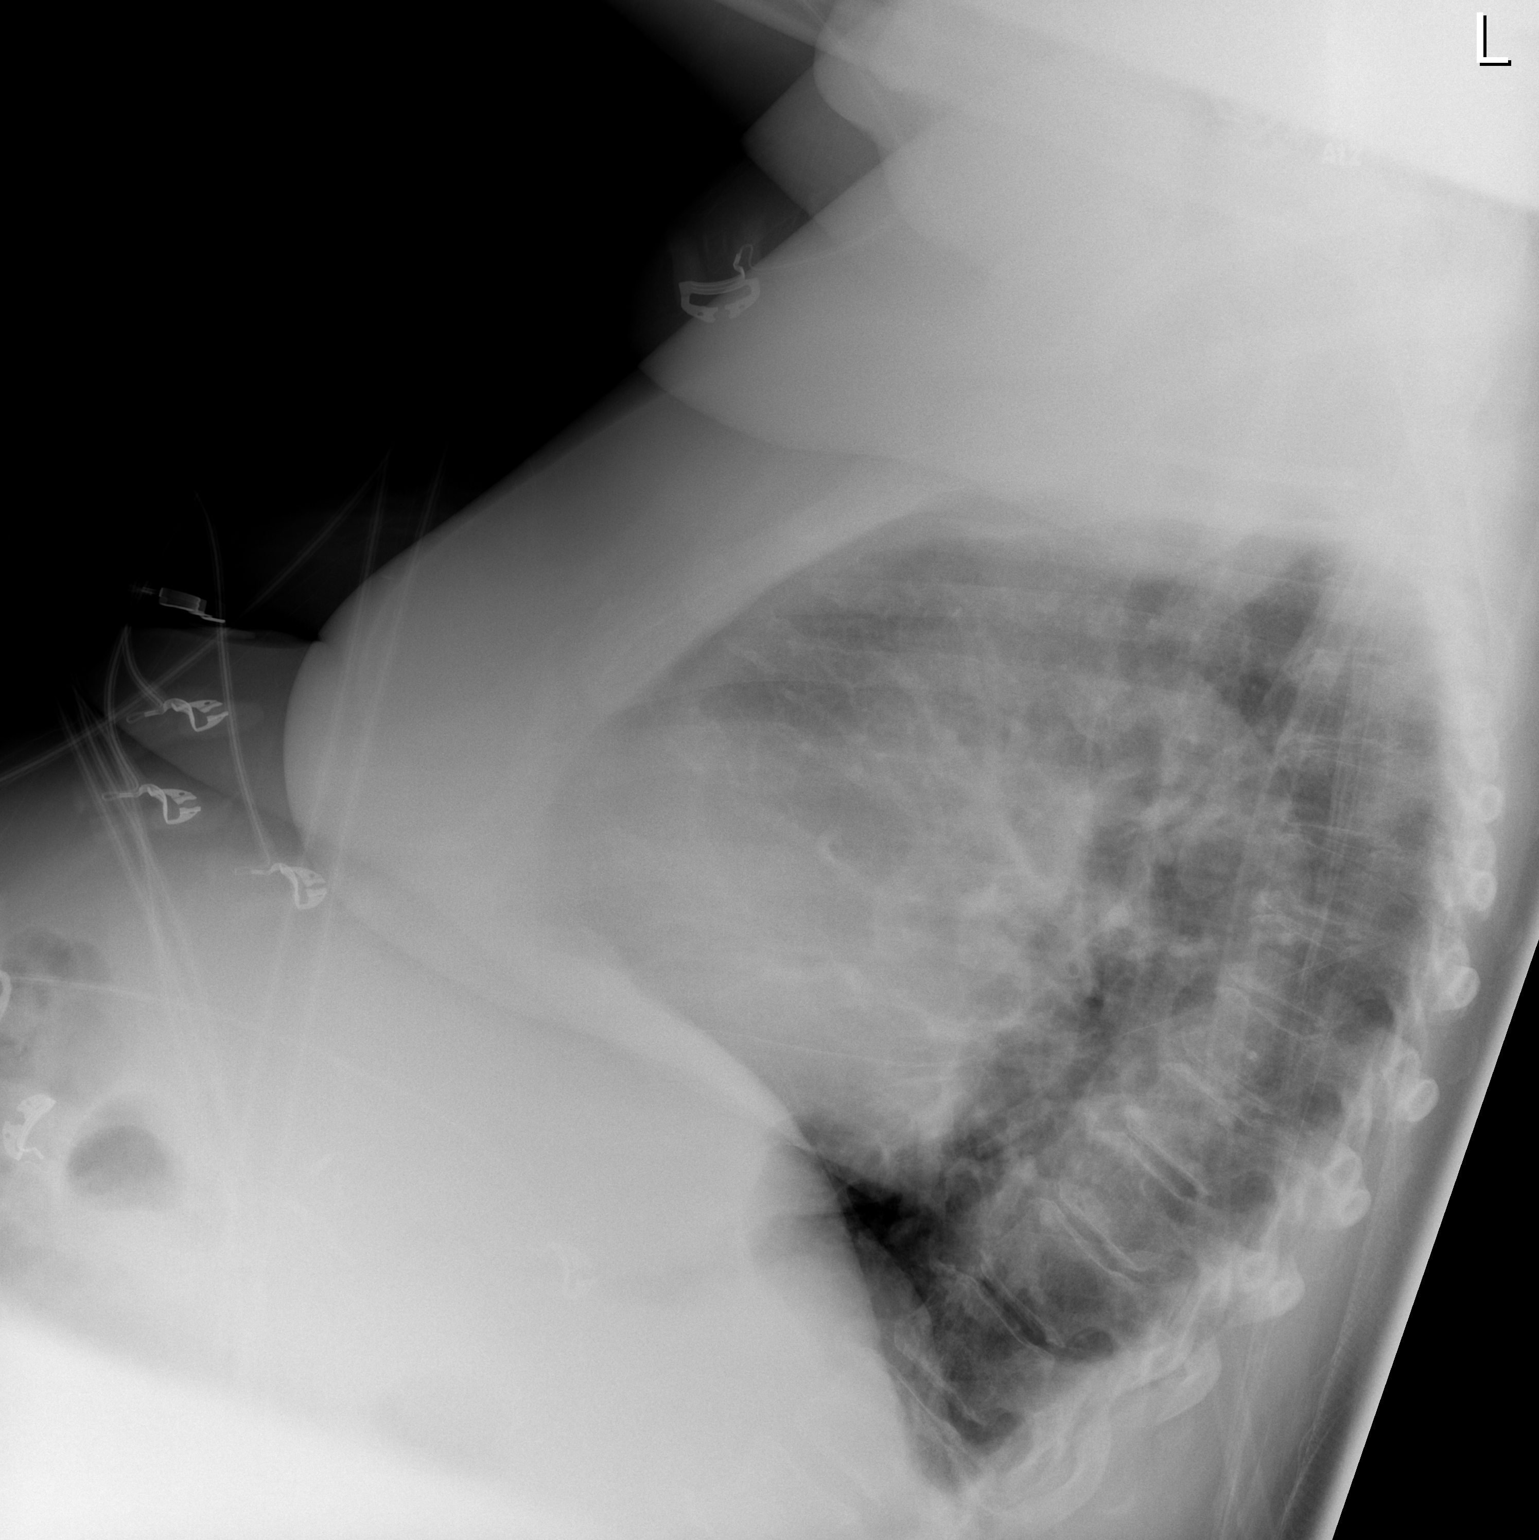

[w chest ap]
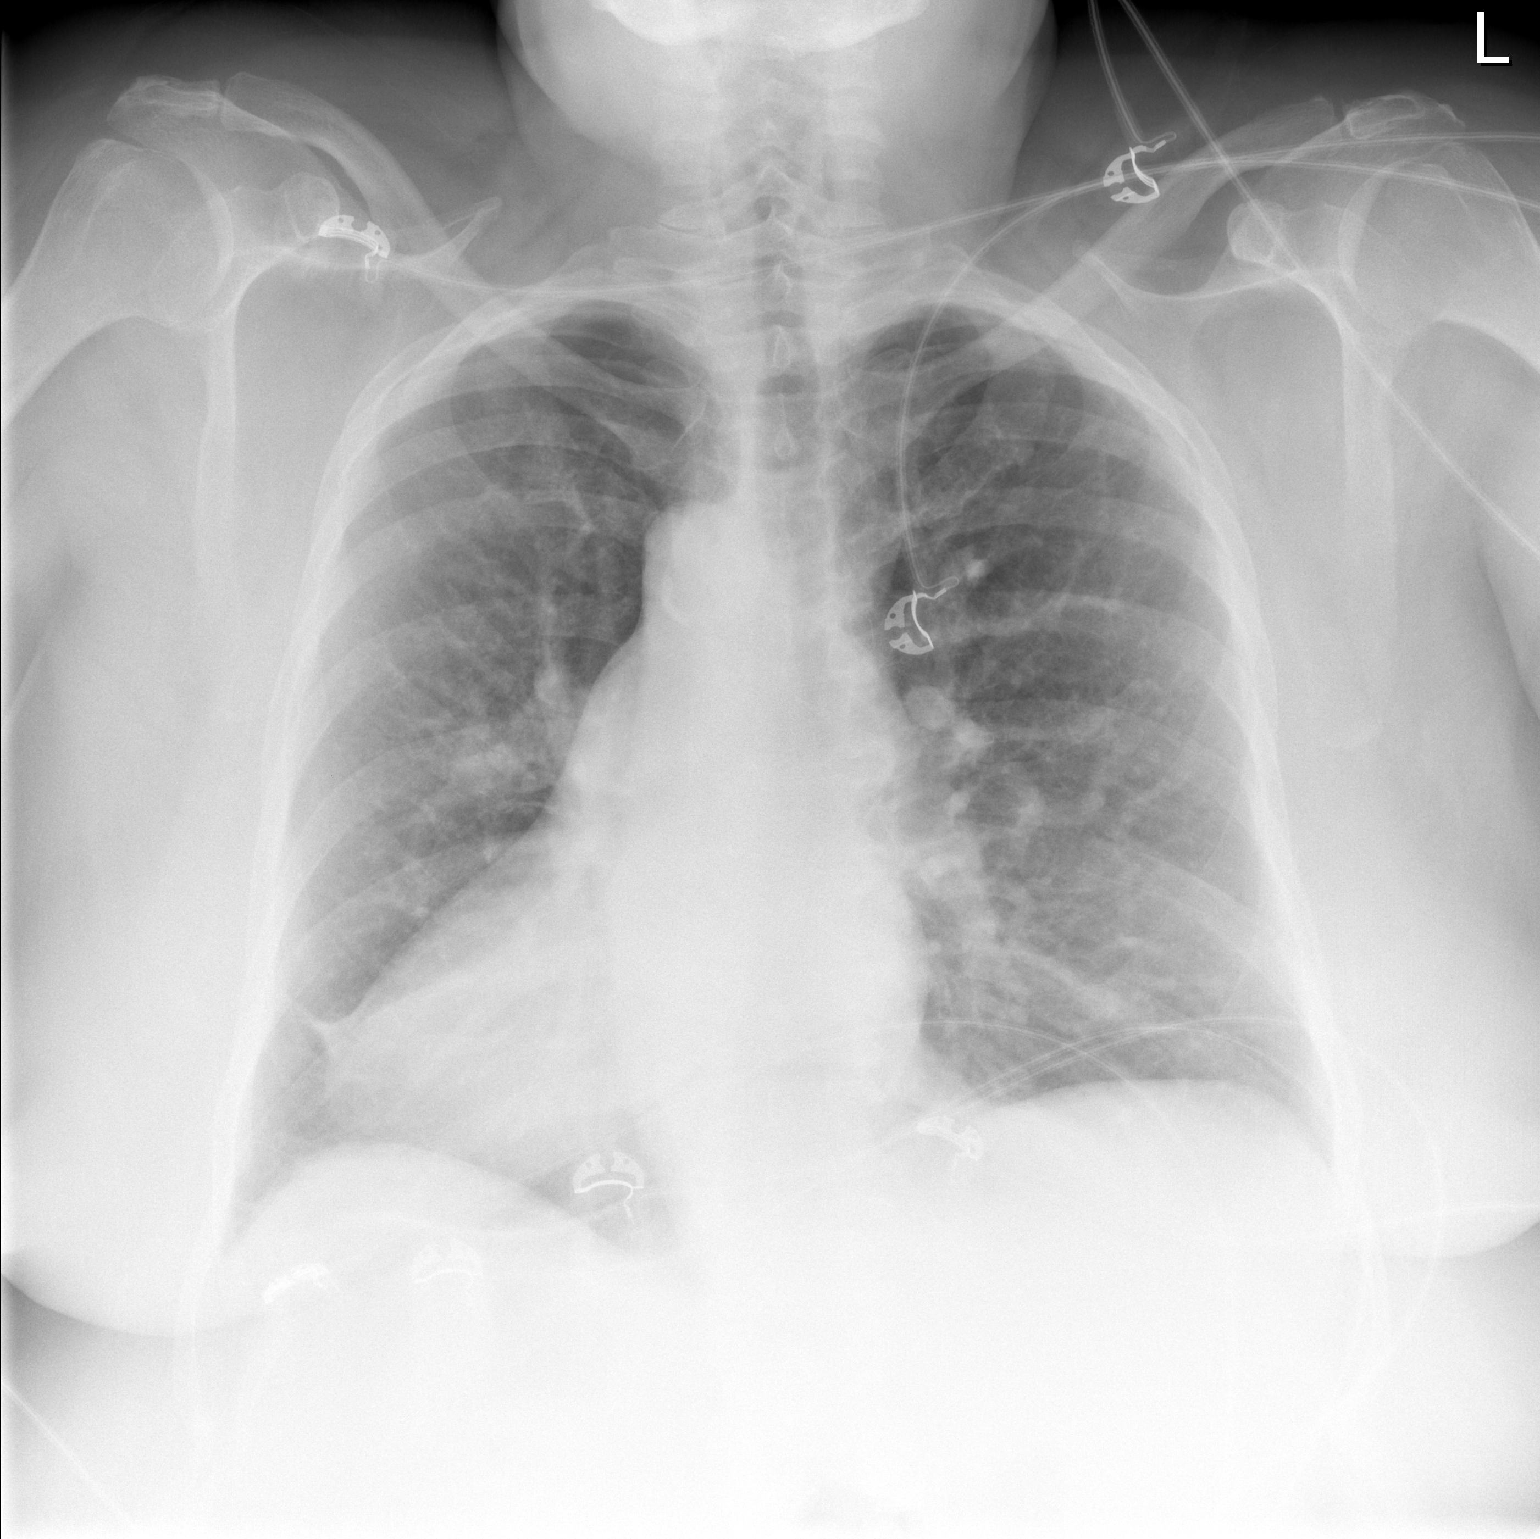

[2 of 2 positions shown; findings below may reference images not displayed]

FINDINGS: Cardiomediastinal silhouette is stable. Central mild vascular
congestion without convincing pulmonary edema. Streaky atelectasis
or early infiltrate left perihilar.
IMPRESSION: No convincing pulmonary edema. Streaky atelectasis or early
infiltrate left perihilar.

## 2017-09-24 ENCOUNTER — Other Ambulatory Visit: Payer: Self-pay

## 2017-09-24 ENCOUNTER — Inpatient Hospital Stay (HOSPITAL_BASED_OUTPATIENT_CLINIC_OR_DEPARTMENT_OTHER)
Admission: EM | Admit: 2017-09-24 | Discharge: 2017-10-01 | DRG: 871 | Disposition: A | Discharge status: Home / Self Care | Payer: Medicare Other | Attending: Internal Medicine | Admitting: Internal Medicine

## 2017-09-24 ENCOUNTER — Emergency Department (HOSPITAL_BASED_OUTPATIENT_CLINIC_OR_DEPARTMENT_OTHER): Payer: Medicare Other

## 2017-09-24 ENCOUNTER — Encounter (HOSPITAL_BASED_OUTPATIENT_CLINIC_OR_DEPARTMENT_OTHER): Payer: Self-pay | Admitting: *Deleted

## 2017-09-24 DIAGNOSIS — J441 Chronic obstructive pulmonary disease with (acute) exacerbation: Secondary | ICD-10-CM | POA: Diagnosis present

## 2017-09-24 DIAGNOSIS — Z791 Long term (current) use of non-steroidal anti-inflammatories (NSAID): Secondary | ICD-10-CM

## 2017-09-24 DIAGNOSIS — Z720 Tobacco use: Secondary | ICD-10-CM | POA: Diagnosis present

## 2017-09-24 DIAGNOSIS — R600 Localized edema: Secondary | ICD-10-CM | POA: Diagnosis present

## 2017-09-24 DIAGNOSIS — E611 Iron deficiency: Secondary | ICD-10-CM | POA: Diagnosis present

## 2017-09-24 DIAGNOSIS — R001 Bradycardia, unspecified: Secondary | ICD-10-CM | POA: Diagnosis present

## 2017-09-24 DIAGNOSIS — Z9981 Dependence on supplemental oxygen: Secondary | ICD-10-CM

## 2017-09-24 DIAGNOSIS — J9601 Acute respiratory failure with hypoxia: Secondary | ICD-10-CM | POA: Diagnosis not present

## 2017-09-24 DIAGNOSIS — G4733 Obstructive sleep apnea (adult) (pediatric): Secondary | ICD-10-CM | POA: Diagnosis present

## 2017-09-24 DIAGNOSIS — Z7984 Long term (current) use of oral hypoglycemic drugs: Secondary | ICD-10-CM

## 2017-09-24 DIAGNOSIS — E669 Obesity, unspecified: Secondary | ICD-10-CM | POA: Diagnosis present

## 2017-09-24 DIAGNOSIS — R1010 Upper abdominal pain, unspecified: Secondary | ICD-10-CM | POA: Diagnosis not present

## 2017-09-24 DIAGNOSIS — R109 Unspecified abdominal pain: Secondary | ICD-10-CM | POA: Diagnosis present

## 2017-09-24 DIAGNOSIS — T380X5A Adverse effect of glucocorticoids and synthetic analogues, initial encounter: Secondary | ICD-10-CM | POA: Diagnosis present

## 2017-09-24 DIAGNOSIS — M359 Systemic involvement of connective tissue, unspecified: Secondary | ICD-10-CM | POA: Diagnosis present

## 2017-09-24 DIAGNOSIS — Z79899 Other long term (current) drug therapy: Secondary | ICD-10-CM

## 2017-09-24 DIAGNOSIS — J9621 Acute and chronic respiratory failure with hypoxia: Secondary | ICD-10-CM | POA: Diagnosis present

## 2017-09-24 DIAGNOSIS — A419 Sepsis, unspecified organism: Secondary | ICD-10-CM | POA: Diagnosis present

## 2017-09-24 DIAGNOSIS — Z6841 Body Mass Index (BMI) 40.0 and over, adult: Secondary | ICD-10-CM

## 2017-09-24 DIAGNOSIS — E785 Hyperlipidemia, unspecified: Secondary | ICD-10-CM | POA: Diagnosis present

## 2017-09-24 DIAGNOSIS — I10 Essential (primary) hypertension: Secondary | ICD-10-CM | POA: Diagnosis present

## 2017-09-24 DIAGNOSIS — R0981 Nasal congestion: Secondary | ICD-10-CM | POA: Diagnosis not present

## 2017-09-24 DIAGNOSIS — R531 Weakness: Secondary | ICD-10-CM

## 2017-09-24 DIAGNOSIS — Z7982 Long term (current) use of aspirin: Secondary | ICD-10-CM

## 2017-09-24 DIAGNOSIS — Z7951 Long term (current) use of inhaled steroids: Secondary | ICD-10-CM

## 2017-09-24 DIAGNOSIS — Z87891 Personal history of nicotine dependence: Secondary | ICD-10-CM

## 2017-09-24 DIAGNOSIS — Z79891 Long term (current) use of opiate analgesic: Secondary | ICD-10-CM

## 2017-09-24 DIAGNOSIS — R05 Cough: Secondary | ICD-10-CM | POA: Diagnosis not present

## 2017-09-24 DIAGNOSIS — S92912A Unspecified fracture of left toe(s), initial encounter for closed fracture: Secondary | ICD-10-CM | POA: Diagnosis present

## 2017-09-24 DIAGNOSIS — S92512A Displaced fracture of proximal phalanx of left lesser toe(s), initial encounter for closed fracture: Secondary | ICD-10-CM | POA: Diagnosis present

## 2017-09-24 DIAGNOSIS — D509 Iron deficiency anemia, unspecified: Secondary | ICD-10-CM | POA: Diagnosis present

## 2017-09-24 DIAGNOSIS — R402413 Glasgow coma scale score 13-15, at hospital admission: Secondary | ICD-10-CM | POA: Diagnosis present

## 2017-09-24 DIAGNOSIS — E1169 Type 2 diabetes mellitus with other specified complication: Secondary | ICD-10-CM | POA: Diagnosis present

## 2017-09-24 DIAGNOSIS — D72829 Elevated white blood cell count, unspecified: Secondary | ICD-10-CM

## 2017-09-24 DIAGNOSIS — B349 Viral infection, unspecified: Secondary | ICD-10-CM | POA: Diagnosis present

## 2017-09-24 DIAGNOSIS — S92402A Displaced unspecified fracture of left great toe, initial encounter for closed fracture: Secondary | ICD-10-CM | POA: Diagnosis not present

## 2017-09-24 DIAGNOSIS — E1165 Type 2 diabetes mellitus with hyperglycemia: Secondary | ICD-10-CM | POA: Diagnosis present

## 2017-09-24 DIAGNOSIS — K219 Gastro-esophageal reflux disease without esophagitis: Secondary | ICD-10-CM | POA: Diagnosis present

## 2017-09-24 DIAGNOSIS — W228XXA Striking against or struck by other objects, initial encounter: Secondary | ICD-10-CM | POA: Diagnosis present

## 2017-09-24 DIAGNOSIS — R0600 Dyspnea, unspecified: Secondary | ICD-10-CM | POA: Diagnosis not present

## 2017-09-24 DIAGNOSIS — R1013 Epigastric pain: Secondary | ICD-10-CM | POA: Diagnosis not present

## 2017-09-24 DIAGNOSIS — Z880 Allergy status to penicillin: Secondary | ICD-10-CM

## 2017-09-24 DIAGNOSIS — R1084 Generalized abdominal pain: Secondary | ICD-10-CM | POA: Diagnosis not present

## 2017-09-24 DIAGNOSIS — R197 Diarrhea, unspecified: Secondary | ICD-10-CM | POA: Diagnosis present

## 2017-09-24 DIAGNOSIS — Z888 Allergy status to other drugs, medicaments and biological substances status: Secondary | ICD-10-CM

## 2017-09-24 DIAGNOSIS — R0982 Postnasal drip: Secondary | ICD-10-CM | POA: Diagnosis not present

## 2017-09-24 DIAGNOSIS — Z862 Personal history of diseases of the blood and blood-forming organs and certain disorders involving the immune mechanism: Secondary | ICD-10-CM

## 2017-09-24 DIAGNOSIS — R059 Cough, unspecified: Secondary | ICD-10-CM

## 2017-09-24 HISTORY — DX: Unspecified asthma, uncomplicated: J45.909

## 2017-09-24 HISTORY — DX: Systemic involvement of connective tissue, unspecified: M35.9

## 2017-09-24 LAB — COMPREHENSIVE METABOLIC PANEL
ALBUMIN: 2.5 g/dL — AB (ref 3.5–5.0)
ALT: 10 U/L — AB (ref 14–54)
AST: 10 U/L — AB (ref 15–41)
Alkaline Phosphatase: 82 U/L (ref 38–126)
Anion gap: 5 (ref 5–15)
BUN: 8 mg/dL (ref 6–20)
CHLORIDE: 107 mmol/L (ref 101–111)
CO2: 27 mmol/L (ref 22–32)
CREATININE: 0.83 mg/dL (ref 0.44–1.00)
Calcium: 7.9 mg/dL — ABNORMAL LOW (ref 8.9–10.3)
GFR calc Af Amer: >60 mL/min (ref >60)
GFR calc non Af Amer: >60 mL/min (ref >60)
Glucose, Bld: 136 mg/dL — ABNORMAL HIGH (ref 65–99)
Potassium: 3.7 mmol/L (ref 3.5–5.1)
SODIUM: 139 mmol/L (ref 135–145)
Total Bilirubin: 0.4 mg/dL (ref 0.3–1.2)
Total Protein: 5.5 g/dL — ABNORMAL LOW (ref 6.5–8.1)

## 2017-09-24 LAB — GLUCOSE, CAPILLARY: Glucose-Capillary: 138 mg/dL — ABNORMAL HIGH (ref 65–99)

## 2017-09-24 LAB — URINALYSIS, ROUTINE W REFLEX MICROSCOPIC
GLUCOSE, UA: NEGATIVE mg/dL
HGB URINE DIPSTICK: NEGATIVE
Ketones, ur: NEGATIVE mg/dL
Leukocytes, UA: NEGATIVE
Nitrite: NEGATIVE
PROTEIN: NEGATIVE mg/dL
Specific Gravity, Urine: >1.03 — ABNORMAL HIGH (ref 1.005–1.030)
pH: 5.5 (ref 5.0–8.0)

## 2017-09-24 LAB — CBC
HCT: 38.9 % (ref 36.0–46.0)
Hemoglobin: 11.9 g/dL — ABNORMAL LOW (ref 12.0–15.0)
MCH: 24.4 pg — AB (ref 26.0–34.0)
MCHC: 30.6 g/dL (ref 30.0–36.0)
MCV: 79.9 fL (ref 78.0–100.0)
PLATELETS: 220 10*3/uL (ref 150–400)
RBC: 4.87 MIL/uL (ref 3.87–5.11)
RDW: 18.5 % — AB (ref 11.5–15.5)
WBC: 23.1 10*3/uL — ABNORMAL HIGH (ref 4.0–10.5)

## 2017-09-24 LAB — INFLUENZA PANEL BY PCR (TYPE A & B)
Influenza A By PCR: NEGATIVE
Influenza B By PCR: NEGATIVE

## 2017-09-24 LAB — LIPASE, BLOOD: LIPASE: 22 U/L (ref 11–51)

## 2017-09-24 LAB — I-STAT CG4 LACTIC ACID, ED: Lactic Acid, Venous: 1.18 mmol/L (ref 0.5–1.9)

## 2017-09-24 MED ORDER — VANCOMYCIN HCL IN DEXTROSE 1-5 GM/200ML-% IV SOLN
1000.0000 mg | Freq: Two times a day (BID) | INTRAVENOUS | Status: DC
Start: 2017-09-25 — End: 2017-09-25
  Administered 2017-09-25: 1000 mg via INTRAVENOUS
  Filled 2017-09-24 (×2): qty 200

## 2017-09-24 MED ORDER — VANCOMYCIN HCL IN DEXTROSE 1-5 GM/200ML-% IV SOLN
1000.0000 mg | Freq: Once | INTRAVENOUS | Status: AC
  Administered 2017-09-24: 1000 mg via INTRAVENOUS
  Filled 2017-09-24: qty 200

## 2017-09-24 MED ORDER — IPRATROPIUM-ALBUTEROL 0.5-2.5 (3) MG/3ML IN SOLN
3.0000 mL | Freq: Four times a day (QID) | RESPIRATORY_TRACT | Status: DC
  Administered 2017-09-24 (×2): 3 mL via RESPIRATORY_TRACT
  Filled 2017-09-24 (×2): qty 3

## 2017-09-24 MED ORDER — FENTANYL CITRATE (PF) 100 MCG/2ML IJ SOLN
50.0000 ug | Freq: Once | INTRAMUSCULAR | Status: AC
  Administered 2017-09-24: 50 ug via INTRAVENOUS
  Filled 2017-09-24: qty 2

## 2017-09-24 MED ORDER — OSELTAMIVIR PHOSPHATE 75 MG PO CAPS
75.0000 mg | ORAL_CAPSULE | Freq: Once | ORAL | Status: AC
  Administered 2017-09-24: 75 mg via ORAL
  Filled 2017-09-24: qty 1

## 2017-09-24 MED ORDER — ONDANSETRON 4 MG PO TBDP
4.0000 mg | ORAL_TABLET | Freq: Once | ORAL | Status: AC | PRN
  Administered 2017-09-24: 4 mg via ORAL
  Filled 2017-09-24: qty 1

## 2017-09-24 MED ORDER — SODIUM CHLORIDE 0.9 % IV BOLUS (SEPSIS): 1000.0000 mL | Freq: Once | INTRAVENOUS | Status: DC

## 2017-09-24 MED ORDER — HYOSCYAMINE SULFATE ER 0.375 MG PO TB12
0.3750 mg | ORAL_TABLET | Freq: Two times a day (BID) | ORAL | Status: AC | PRN
  Administered 2017-09-24 – 2017-09-26 (×2): 0.375 mg via ORAL
  Filled 2017-09-24 (×2): qty 1

## 2017-09-24 MED ORDER — IOPAMIDOL (ISOVUE-300) INJECTION 61%
100.0000 mL | Freq: Once | INTRAVENOUS | Status: AC | PRN
  Administered 2017-09-24: 100 mL via INTRAVENOUS

## 2017-09-24 MED ORDER — ACETAMINOPHEN 500 MG PO TABS
1000.0000 mg | ORAL_TABLET | Freq: Once | ORAL | Status: AC
  Administered 2017-09-24: 1000 mg via ORAL
  Filled 2017-09-24: qty 2

## 2017-09-24 MED ORDER — ONDANSETRON HCL 4 MG/2ML IJ SOLN: 4.0000 mg | Freq: Once | INTRAMUSCULAR | Status: DC

## 2017-09-24 MED ORDER — IPRATROPIUM-ALBUTEROL 0.5-2.5 (3) MG/3ML IN SOLN: 3.0000 mL | Freq: Three times a day (TID) | RESPIRATORY_TRACT | Status: DC

## 2017-09-24 MED ORDER — PIPERACILLIN-TAZOBACTAM 3.375 G IVPB
3.3750 g | Freq: Three times a day (TID) | INTRAVENOUS | Status: DC
  Administered 2017-09-24 – 2017-09-26 (×5): 3.375 g via INTRAVENOUS
  Filled 2017-09-24 (×5): qty 50

## 2017-09-24 MED ORDER — SODIUM CHLORIDE 0.9 % IV BOLUS (SEPSIS)
500.0000 mL | Freq: Once | INTRAVENOUS | Status: AC
  Administered 2017-09-24: 500 mL via INTRAVENOUS

## 2017-09-24 MED ORDER — ACETAMINOPHEN 325 MG PO TABS
650.0000 mg | ORAL_TABLET | Freq: Four times a day (QID) | ORAL | Status: DC | PRN
Start: 2017-09-24 — End: 2017-10-01
  Administered 2017-09-24 – 2017-09-25 (×2): 650 mg via ORAL
  Filled 2017-09-24 (×2): qty 2

## 2017-09-24 MED ORDER — SODIUM CHLORIDE 0.9 % IV BOLUS (SEPSIS)
1000.0000 mL | Freq: Once | INTRAVENOUS | Status: AC
  Administered 2017-09-24: 1000 mL via INTRAVENOUS

## 2017-09-24 MED ORDER — PIPERACILLIN-TAZOBACTAM 3.375 G IVPB 30 MIN
3.3750 g | Freq: Once | INTRAVENOUS | Status: AC
  Administered 2017-09-24: 3.375 g via INTRAVENOUS
  Filled 2017-09-24 (×2): qty 50

## 2017-09-24 MED ORDER — SODIUM CHLORIDE 0.9 % IV SOLN
INTRAVENOUS | Status: DC
  Administered 2017-09-24 – 2017-09-25 (×3): via INTRAVENOUS

## 2017-09-24 NOTE — Progress Notes (Signed)
Pharmacy Antibiotic Note  Kelli Rivera is a 66 y.o. female admitted on 09/24/2017 with sepsis.  Pharmacy has been consulted for Zosyn and vancomycin dosing.  Weight 118 kg on 11/27 office visit. Tmax of 101.8, WBC 23.1. SCr stable. Normalized CrCl ~59ml/min. Had more of intolerance to Augmentin with GI upset, not true allergy.  Plan: Give another vancomycin 1g dose to complete 2g load dose, then start vancomycin 1g IV Q12h Start Zosyn 3.375 gm IV q8h (4 hour infusion) Monitor clinical picture, renal function, VT prn F/U C&S, abx deescalation / LOT    Temp (24hrs), Avg:99.8 F (37.7 C), Min:98.2 F (36.8 C), Max:101.8 F (38.8 C)  Recent Labs  Lab 09/24/17 1152  WBC 23.1*  CREATININE 0.83    CrCl cannot be calculated (Unknown ideal weight.).    Allergies  Allergen Reactions  . Liraglutide Nausea And Vomiting  . Pioglitazone Swelling  . Amoxicillin-Pot Clavulanate Nausea And Vomiting    Antimicrobials this admission: Zosyn 12/1 >>  Vancomycin 12/1 >>   Dose adjustments this admission: n/a  Microbiology results: 12/1 BCx: sent  Thank you for allowing pharmacy to be a part of this patient's care.  Armandina Stammer 09/24/2017 2:21 PM

## 2017-09-24 NOTE — H&P (Addendum)
History and Physical    Kelli Rivera JJK:[id number] DOB: April 23, 1951 DOA: 09/24/2017  Referring MD/NP/PA:   PCP: Angelica Chessman, MD   Patient coming from:  The patient is coming from home.  At baseline, pt is independent for most of ADL.   Chief Complaint:  Cough, shortness of breath, fever, chills, abdominal pain, left fifth toe injury  HPI: Kelli Rivera is a 66 y.o. female with medical history significant of hypertension, diabetes mellitus, COPD, on 2 L oxygen at home, asthma, GERD, obesity, collagen vascular disease, iron deficiency anemia, who presents with cough, shortness of breath, fever, chills, abdominal pain, left fifth toe injury.  Patient states that she has been having cough, shortness breath for more than 3 days. She also has body aches, generalized weakness fever and chills. She does not have chest pain. She coughs up clear mucus. Patient states that she had diarrhea 2 days ago which has resolved. She has nausea, but no vomiting. She also reports abdominal pain, which is located in the central abdomen, constant, 6 out of 10 in severity, nonradiating. She denies symptoms of UTI or unilateral weakness. Patient also reports pain in her left 5th toe after hitting it on a piece of furniture this morning.  ED Course: pt was found to have WBC 23.1, lactic acid 1.18, negative urinalysis, flu negative, lipase 22, electrolytes renal function okay, temperature 101.8, tachycardia, tachypnea, oxygen saturation 92% on 2 L nasal cannula oxygen, soft blood pressure. CT abdomen/pelvis not impressive. CT of chest is negative for acute issues. X-ray of the left foot showed minimal displaced fracture at the base of the proximal phalanx of the fifth toes. Patient is admitted to stepdown as inpatient.   Review of Systems:   General: has fevers, chills, no body weight gain, has poor appetite, has fatigue HEENT: no blurry vision, hearing changes or sore throat Respiratory: has dyspnea, coughing,  wheezing CV: no chest pain, no palpitations GI: has nausea, abdominal pain, no diarrhea, constipation, vomiting, GU: no dysuria, burning on urination, increased urinary frequency, hematuria  Ext: no leg edema Neuro: no unilateral weakness, numbness, or tingling, no vision change or hearing loss Skin: no rash, no skin tear. MSK: has left 5th toe pain Heme: No easy bruising.  Travel history: No recent long distant travel.  Allergy:  Allergies  Allergen Reactions  . Liraglutide Nausea And Vomiting  . Pioglitazone Swelling  . Amoxicillin-Pot Clavulanate Nausea And Vomiting    Past Medical History:  Diagnosis Date  . Asthma   . Collagen vascular disease (HCC)   . COPD (chronic obstructive pulmonary disease) (HCC)    O2 increased to 3L while ambulating, 2L at rest as of last week  . Diabetes mellitus without complication (HCC)   . High cholesterol   . History of leukocytosis    negative bone marrow, followed by heme q73mo  . Morbid obesity (HCC)     Past Surgical History:  Procedure Laterality Date  . ABDOMINAL HYSTERECTOMY    . APPENDECTOMY     age 84 yrs  . bladder tuck    . CHOLECYSTECTOMY      Social History:  reports that she quit smoking about 11 years ago. She has a 42.00 pack-year smoking history. she has never used smokeless tobacco. She reports that she does not drink alcohol or use drugs.  Family History:  Family History  Problem Relation Age of Onset  . Heart failure Mother 33  . Lung cancer Father 33  Prior to Admission medications   Medication Sig Start Date End Date Taking? Authorizing Provider  albuterol (PROVENTIL HFA;VENTOLIN HFA) 108 (90 Base) MCG/ACT inhaler Inhale 2 puffs into the lungs every 4 (four) hours as needed for wheezing or shortness of breath. 12/17/15   [provider]  aspirin EC 81 MG tablet Take 81 mg by mouth daily.    [provider]  atorvastatin (LIPITOR) 40 MG tablet Take 40 mg by mouth daily.    [provider]  ferrous sulfate 325 (65 FE) MG tablet Take 325 mg by mouth daily.  09/21/16   [provider]  fluticasone (FLONASE) 50 MCG/ACT nasal spray Place 2 sprays into the nose daily.    [provider]  Fluticasone-Salmeterol (ADVAIR DISKUS) 500-50 MCG/DOSE AEPB Inhale 1 puff into the lungs every 12 (twelve) hours.    [provider]  furosemide (LASIX) 40 MG tablet Take 40 mg by mouth daily.     [provider]  meloxicam (MOBIC) 15 MG tablet Take 15 mg by mouth daily. 11/07/15   [provider]  metFORMIN (GLUCOPHAGE) 1000 MG tablet Take 1,000 mg by mouth 2 (two) times daily with a meal.    [provider]  mirabegron ER (MYRBETRIQ) 50 MG TB24 tablet Take 50 mg by mouth daily. 05/24/16   [provider]  montelukast (SINGULAIR) 10 MG tablet Take 10 mg by mouth at bedtime. 08/18/15   [provider]  Multiple Vitamins-Minerals (ALIVE WOMENS 50+ PO) Take 2 tablets by mouth daily.    [provider]  omeprazole (PRILOSEC) 40 MG capsule Take 40 mg by mouth daily. 08/31/16   [provider]  tiZANidine (ZANAFLEX) 4 MG tablet Take 4 mg by mouth 3 (three) times daily. 09/13/16   [provider]  TOUJEO SOLOSTAR 300 UNIT/ML SOPN Inject 70 Units into the skin at bedtime.  08/31/16   [provider]  traMADol (ULTRAM) 50 MG tablet Take 50 mg by mouth 3 (three) times daily. 08/16/16   [provider]    Physical Exam: Vitals:   09/25/17 0400 09/25/17 0500 09/25/17 0600 09/25/17 0800  BP: (!) 112/59 (!) 126/58 (!) 119/52 130/61  Pulse: 92 92 88 99  Resp: 20 (!) 22 19 (!) 23  Temp: 99.5 F (37.5 C)   (!) 101 F (38.3 C)  TempSrc: Axillary   Oral  SpO2:    97%  Weight:      Height:       General: Not in acute distress HEENT:       Eyes: PERRL, EOMI, no scleral icterus.       ENT: No discharge from the ears and nose, no pharynx injection, no tonsillar enlargement.         Neck: No JVD, no bruit, no mass felt. Heme: No neck lymph node enlargement. Cardiac: S1/S2, RRR, No murmurs, No gallops or rubs. Respiratory: Has wheezing bilaterally.  GI: Soft, nondistended, has tenderness in central abdomen, no rebound pain, no organomegaly, BS present. GU: No hematuria Ext: No pitting leg edema bilaterally. 2+DP/PT pulse bilaterally. Musculoskeletal: has tenderness in left 5th toe Skin: No rashes.  Neuro: Alert, oriented X3, cranial nerves II-XII grossly intact, moves all extremities normally.  Psych: Patient is not psychotic, no suicidal or hemocidal ideation.  Labs on Admission: I have personally reviewed following labs and imaging studies  CBC: Recent Labs  Lab 09/24/17 1152  WBC 23.1*  HGB 11.9*  HCT 38.9  MCV 79.9  PLT 220  Basic Metabolic Panel: Recent Labs  Lab 09/24/17 1152  NA 139  K 3.7  CL 107  CO2 27  GLUCOSE 136*  BUN 8  CREATININE 0.83  CALCIUM 7.9*   GFR: Estimated Creatinine Clearance: 86.5 mL/min (by C-G formula based on SCr of 0.83 mg/dL). Liver Function Tests: Recent Labs  Lab 09/24/17 1152  AST 10*  ALT 10*  ALKPHOS 82  BILITOT 0.4  PROT 5.5*  ALBUMIN 2.5*   Recent Labs  Lab 09/24/17 1152  LIPASE 22   No results for input(s): AMMONIA in the last 168 hours. Coagulation Profile: No results for input(s): INR, PROTIME in the last 168 hours. Cardiac Enzymes: No results for input(s): CKTOTAL, CKMB, CKMBINDEX, TROPONINI in the last 168 hours. BNP (last 3 results) No results for input(s): PROBNP in the last 8760 hours. HbA1C: No results for input(s): HGBA1C in the last 72 hours. CBG: Recent Labs  Lab 09/24/17 2214 09/25/17 0809  GLUCAP 138* 106*   Lipid Profile: No results for input(s): CHOL, HDL, LDLCALC, TRIG, CHOLHDL, LDLDIRECT in the last 72 hours. Thyroid Function Tests: No results for input(s): TSH, T4TOTAL, FREET4, T3FREE, THYROIDAB in the last 72 hours. Anemia Panel: No results for input(s):  VITAMINB12, FOLATE, FERRITIN, TIBC, IRON, RETICCTPCT in the last 72 hours. Urine analysis:    Component Value Date/Time   COLORURINE YELLOW 09/24/2017 1412   APPEARANCEUR CLEAR 09/24/2017 1412   LABSPEC >1.030 (H) 09/24/2017 1412   PHURINE 5.5 09/24/2017 1412   GLUCOSEU NEGATIVE 09/24/2017 1412   HGBUR NEGATIVE 09/24/2017 1412   BILIRUBINUR SMALL (A) 09/24/2017 1412   KETONESUR NEGATIVE 09/24/2017 1412   PROTEINUR NEGATIVE 09/24/2017 1412   NITRITE NEGATIVE 09/24/2017 1412   LEUKOCYTESUR NEGATIVE 09/24/2017 1412   Sepsis Labs: @LABRCNTIP (procalcitonin:4,lacticidven:4) ) Recent Results (from the past 240 hour(s))  MRSA PCR Screening     Status: Abnormal   Collection Time: 09/24/17  9:37 PM  Result Value Ref Range Status   MRSA by PCR POSITIVE (A) NEGATIVE Final    Comment:        The GeneXpert MRSA Assay (FDA approved for NASAL specimens only), is one component of a comprehensive MRSA colonization surveillance program. It is not intended to diagnose MRSA infection nor to guide or monitor treatment for MRSA infections. RESULT CALLED TO, READ BACK BY AND VERIFIED WITH: R MCINTOSH AT 0333 ON 12.02.2018 BY NBROOKS      Radiological Exams on Admission: Dg Chest 2 View  Result Date: 09/24/2017 CLINICAL DATA:  Cough, congestion EXAM: CHEST  2 VIEW COMPARISON:  09/22/2016 FINDINGS: Heart is borderline in size. Lingular scarring. Mild vascular congestion. No overt edema or acute opacities. No effusions or acute bony abnormality. IMPRESSION: Borderline heart size.  Mild vascular congestion. Lingular scarring. Electronically Signed   By: Charlett Nose M.D.   On: 09/24/2017 12:37   Ct Chest W Contrast  Result Date: 09/24/2017 CLINICAL DATA:  Diabetic patient with generalized weakness. Nausea and vomiting. EXAM: CT CHEST, ABDOMEN, AND PELVIS WITH CONTRAST TECHNIQUE: Multidetector CT imaging of the chest, abdomen and pelvis was performed following the standard protocol during bolus  administration of intravenous contrast. CONTRAST:  ISOVUE-300 IOPAMIDOL (ISOVUE-300) INJECTION 61% COMPARISON:  Abdomen CT April 26, 2015 FINDINGS: CT CHEST FINDINGS Cardiovascular: The heart size is borderline. No obvious coronary artery calcifications. The main pulmonary artery measures 3.6 cm. No filling defects in the pulmonary arteries on limited images. The thoracic aorta is normal in caliber. Taking motion artifact into account, no dissection. There is mild  atherosclerosis in the arch. A superior aortic recess is identified with fluid adjacent to the aorta, extending inferiorly. The underlying aorta is normal in appearance. Mediastinum/Nodes: Tiny nodules in the thyroid of doubtful significance. The esophagus is normal in appearance. No adenopathy. No effusions. Lungs/Pleura: Central airways are normal. No pneumothorax. Scattered atelectasis. No pulmonary nodules, masses, or focal infiltrates. Musculoskeletal: See below. CT ABDOMEN PELVIS FINDINGS Hepatobiliary: The patient is status post hepatic steatosis. No biliary duct dilatation. Limited views of the pulmonary arteries are normal with opacification. No liver masses are seen. Pancreas: Unremarkable. No pancreatic ductal dilatation or surrounding inflammatory changes. Spleen: Normal in size without focal abnormality. Adrenals/Urinary Tract: There is a probable cyst in the lower left kidney, too small to characterize. No hydronephrosis, suspicious mass, or stone. No ureterectasis or ureteral stone. The bladder is normal. Stomach/Bowel: The stomach and small bowel are normal. A few scattered colonic diverticuli are identified without diverticulitis. The appendix is not seen but there is no evidence of appendicitis. By report, the patient is status post appendectomy. Vascular/Lymphatic: The abdominal aorta is normal in caliber with no dissection. No adenopathy. Reproductive: Status post hysterectomy. No adnexal masses. Other: No abdominal wall hernia or  abnormality. No abdominopelvic ascites. Musculoskeletal: No acute or significant osseous findings. IMPRESSION: 1. No acute abnormalities identified to explain the patient's symptoms. 2. The main pulmonary artery measures 3.6 cm raising the possibility of pulmonary arterial hypertension. 3. 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 Aortic Atherosclerosis (ICD10-I70.0). Electronically Signed   By: Gerome Samavid  Williams III M.D   On: 09/24/2017 15:51   Ct Abdomen Pelvis W Contrast  Result Date: 09/24/2017 CLINICAL DATA:  Diabetic patient with generalized weakness. Nausea and vomiting. EXAM: CT CHEST, ABDOMEN, AND PELVIS WITH CONTRAST TECHNIQUE: Multidetector CT imaging of the chest, abdomen and pelvis was performed following the standard protocol during bolus administration of intravenous contrast. CONTRAST:  100mL ISOVUE-300 IOPAMIDOL (ISOVUE-300) INJECTION 61% COMPARISON:  Abdomen CT April 26, 2015 FINDINGS: CT CHEST FINDINGS Cardiovascular: The heart size is borderline. No obvious coronary artery calcifications. The main pulmonary artery measures 3.6 cm. No filling defects in the pulmonary arteries on limited images. The thoracic aorta is normal in caliber. Taking motion artifact into account, no dissection. There is mild atherosclerosis in the arch. A superior aortic recess is identified with fluid adjacent to the aorta, extending inferiorly. The underlying aorta is normal in appearance. Mediastinum/Nodes: Tiny nodules in the thyroid of doubtful significance. The esophagus is normal in appearance. No adenopathy. No effusions. Lungs/Pleura: Central airways are normal. No pneumothorax. Scattered atelectasis. No pulmonary nodules, masses, or focal infiltrates. Musculoskeletal: See below. CT ABDOMEN PELVIS FINDINGS Hepatobiliary: The patient is status post hepatic steatosis. No biliary duct dilatation. Limited views of the pulmonary arteries are normal with opacification. No liver masses are seen. Pancreas: Unremarkable. No  pancreatic ductal dilatation or surrounding inflammatory changes. Spleen: Normal in size without focal abnormality. Adrenals/Urinary Tract: There is a probable cyst in the lower left kidney, too small to characterize. No hydronephrosis, suspicious mass, or stone. No ureterectasis or ureteral stone. The bladder is normal. Stomach/Bowel: The stomach and small bowel are normal. A few scattered colonic diverticuli are identified without diverticulitis. The appendix is not seen but there is no evidence of appendicitis. By report, the patient is status post appendectomy. Vascular/Lymphatic: The abdominal aorta is normal in caliber with no dissection. No adenopathy. Reproductive: Status post hysterectomy. No adnexal masses. Other: No abdominal wall hernia or abnormality. No abdominopelvic ascites. Musculoskeletal: No acute or significant osseous findings.  IMPRESSION: 1. No acute abnormalities identified to explain the patient's symptoms. 2. The main pulmonary artery measures 3.6 cm raising the possibility of pulmonary arterial hypertension. 3. 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 Aortic Atherosclerosis (ICD10-I70.0). Electronically Signed   By: Gerome Sam III M.D   On: 09/24/2017 15:51   Dg Toe 5th Left  Result Date: 09/24/2017 CLINICAL DATA:  Hit left fifth toe yesterday.  Pain.  Bruising EXAM: DG TOE 5TH LEFT COMPARISON:  None. FINDINGS: Lucency at the base of the proximal phalanx of the left little toe compatible with fracture. No subluxation or dislocation. IMPRESSION: Minimally displaced fracture at the base of the proximal phalanx left little toe. Electronically Signed   By: Charlett Nose M.D.   On: 09/24/2017 12:54     EKG: Independently reviewed. Sinus rhythm, QTC 420, LAD, low voltage, occasional PVC.  Assessment/Plan Principal Problem:   COPD with acute exacerbation (HCC) Active Problems:   Hyperlipidemia   Diabetes mellitus type 2 in obese (HCC)   Sepsis (HCC)   Diarrhea   Toe fracture,  left   GERD (gastroesophageal reflux disease)   Iron deficiency   Tobacco abuse   Abdominal pain   COPD with acute exacerbation and sepsis: Patient's productive cough, shortness of breath, wheezing on auscultation, consistent with COPD exacerbation. CT of chest is negative for acute issues. Patient meets criteria for sepsis with leukocytosis, fever, tachycardia and tachypnea. bp is soft, but hemodynamically stable. Flu pcr negative. Patient received one dose of Tamiflu before flu PCR came back.  -will admit patient to SDu -Nebulizers: scheduled Duoneb and prn albuterol -Solu-Medrol 60 mg IV daily  -IV vanco and zosyn -Mucinex for cough  --Incentive spirometry -Urine S. pneumococcal antigen -Follow up blood culture x2, sputum culture, respiratory virus panel -Nasal cannula oxygen as needed to maintain O2 saturation 92% or greater -will get Procalcitonin and trend lactic acid levels per sepsis protocol. -IVF: 2L of NS bolus in ED, followed by 75 cc/h   HLD: -Lipitor  Diabetes mellitus type 2 in obese: Last A1c 7.7, poorly controled. Patient is taking toujeo and metformin at home -will decrease toujeo dose from  70 to 50 daily -SSI  GERD: -Protonix  Iron deficiency anemia: Hemoglobin 11.9. -Continue iron supplement  Tobacco abuse -Did counseling about importance of quitting smoking -Nicotine patch  Diarrhea and abdominal pain: Her diarrhea has resolved. CT abdomen/pelvis is negative. Lipase normal. Likely due to viral infection. -When necessary Zofran for nausea, and morphine for pain -IV fluid as above  Toe fracture, left 5th toe -pain control -please call ortho in AM.  DVT ppx: SQ Lovenox Code Status: Full code Family Communication: Yes, patient's  daughter at bed side Disposition Plan:  Anticipate discharge back to previous home environment Consults called:  none Admission status:  SDU/inpation       Date of Service 09/25/2017    Lorretta Harp [facility] Pager [phone]  If 7PM-7AM, please contact night-coverage [email or web address] Password [facility] 09/25/2017, 8:41 AM

## 2017-09-24 NOTE — ED Notes (Signed)
Patient transported to CT 

## 2017-09-24 NOTE — ED Triage Notes (Signed)
Pt c/o generalized weakness n/d x4 days

## 2017-09-24 NOTE — Progress Notes (Signed)
Received call by ED doc at [facility] to admit Ms. Kelli Rivera, 66 year old female with past medical history significant for diabetes, chronic respiratory failure secondary to COPD (on 2-3 L of oxygen supplementation), hypertension and hyperlipidemia; who presented due to 3 days of general malaise, myalgias, fevers, worsening shortness of breath, coughing, nausea/vomiting and weakness.  In the ED patient was found to be meeting sepsis criteria with elevated heart rate, WBCs, elevated temperature, elevated heart rate and concerns for flu.  Patient blood pressure was soft despite 500 bolus given with mild work of breathing.  Accepted to stepdown bed for further evaluation and treatment at [facility]   Vassie Loll MD [phone]

## 2017-09-24 NOTE — ED Provider Notes (Signed)
[facility] EMERGENCY DEPARTMENT Provider Note   CSN: [id number] Arrival date & time: 09/24/17  1127     History   Chief Complaint Chief Complaint  Patient presents with  . Weakness    HPI Kelli Rivera is a 66 y.o. female with history of diabetes, COPD who presents with a 4-day history of cough, nausea, intermittent nonbloody diarrhea, myalgias, and generalized weakness and fatigue.  Patient reports her cough has been productive with clear sputum.  She has had nausea, but no vomiting.  She has had 2-3 episodes of diarrhea since onset.  She has had a decreased appetite and has not been eating and drinking that much.  She reports she has had some intermittent abdominal pain that she describes as cramping before diarrhea.  She does have some right lower quadrant pain at this time.  Patient denies any urinary symptoms.  Patient uses 2 L oxygen at home as needed.  Patient also reports pain in her left 5th toe after hitting it on a piece of furniture this morning.  Patient did get her flu shot about 1 month ago.  She denies any fevers, but has had chills.  HPI  Past Medical History:  Diagnosis Date  . Asthma   . Collagen vascular disease (HCC)   . COPD (chronic obstructive pulmonary disease) (HCC)    O2 increased to 3L while ambulating, 2L at rest as of last week  . Diabetes mellitus without complication (HCC)   . High cholesterol   . 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 Morbid obesity [facility])     Patient Active Problem List   Diagnosis Date Noted  . Sepsis (HCC) 09/24/2017  . Fatigue 09/23/2016  . Leukocytosis 09/23/2016  . Morbid obesity (HCC) 09/23/2016  . COPD (chronic obstructive pulmonary disease) (HCC) 09/23/2016  . Hyperlipidemia 09/23/2016  . Diabetes mellitus type 2 in obese (HCC) 09/23/2016  . Elevated lactic acid level 09/23/2016  . Cellulitis 09/22/2016    Past Surgical History:  Procedure Laterality Date  .  ABDOMINAL HYSTERECTOMY    . APPENDECTOMY     age 26 yrs  . bladder tuck    . CHOLECYSTECTOMY      OB History    No data available       Home Medications    Prior to Admission medications   Medication Sig Start Date End Date Taking? Authorizing Provider  albuterol (PROVENTIL HFA;VENTOLIN HFA) 108 (90 Base) MCG/ACT inhaler Inhale 2 puffs into the lungs every 4 (four) hours as needed for wheezing or shortness of breath. 12/17/15   [provider]  aspirin EC 81 MG tablet Take 81 mg by mouth daily.    [provider]  atorvastatin (LIPITOR) 40 MG tablet Take 40 mg by mouth daily.    [provider]  ferrous sulfate 325 (65 FE) MG tablet Take 325 mg by mouth daily.  09/21/16   [provider]  fluticasone (FLONASE) 50 MCG/ACT nasal spray Place 2 sprays into the nose daily.    [provider]  Fluticasone-Salmeterol (ADVAIR DISKUS) 500-50 MCG/DOSE AEPB Inhale 1 puff into the lungs every 12 (twelve) hours.    [provider]  furosemide (LASIX) 40 MG tablet Take 40 mg by mouth daily.     [provider]  meloxicam (MOBIC) 15 MG tablet Take 15 mg by mouth daily. 11/07/15   [provider]  metFORMIN (GLUCOPHAGE) 1000 MG tablet Take 1,000 mg by  mouth 2 (two) times daily with a meal.    [provider]  mirabegron ER (MYRBETRIQ) 50 MG TB24 tablet Take 50 mg by mouth daily. 05/24/16   [provider]  montelukast (SINGULAIR) 10 MG tablet Take 10 mg by mouth at bedtime. 08/18/15   [provider]  Multiple Vitamins-Minerals (ALIVE WOMENS 50+ PO) Take 2 tablets by mouth daily.    [provider]  omeprazole (PRILOSEC) 40 MG capsule Take 40 mg by mouth daily. 08/31/16   [provider]  tiZANidine (ZANAFLEX) 4 MG tablet Take 4 mg by mouth 3 (three) times daily. 09/13/16   [provider]  TOUJEO SOLOSTAR 300 UNIT/ML SOPN Inject 70 Units into the skin at bedtime.  08/31/16    [provider]  traMADol (ULTRAM) 50 MG tablet Take 50 mg by mouth 3 (three) times daily. 08/16/16   [provider]    Family History Family History  Problem Relation Age of Onset  . Heart failure Mother [id number]  . Lung cancer Father 4465    Social History Social History   Tobacco Use  . Smoking status: Former Smoker    Packs/day: 1.00    Years: 42.00    Pack years: 42.00    Last attempt to quit: 2007    Years since quitting: 11.9  . Smokeless tobacco: Never Used  Substance Use Topics  . Alcohol use: No  . Drug use: No     Allergies   Liraglutide; Pioglitazone; and Amoxicillin-pot clavulanate   Review of Systems Review of Systems  Constitutional: Negative for chills and fever.  HENT: Negative for facial swelling and sore throat.   Respiratory: Positive for cough. Negative for shortness of breath.   Cardiovascular: Negative for chest pain.  Gastrointestinal: Positive for abdominal pain, diarrhea and nausea. Negative for blood in stool and vomiting.  Genitourinary: Negative for dysuria.  Musculoskeletal: Positive for arthralgias (L 5th toe). Negative for back pain.  Skin: Negative for rash and wound.  Neurological: Negative for headaches.  Psychiatric/Behavioral: The patient is not nervous/anxious.      Physical Exam Updated Vital Signs BP (!) 106/46   Pulse (!) 102   Temp 99.1 F (37.3 C) (Oral)   Resp 17   SpO2 91%   Physical Exam  Constitutional: She appears well-developed and well-nourished. No distress.  HENT:  Head: Normocephalic and atraumatic.  Mouth/Throat: Oropharynx is clear and moist. No oropharyngeal exudate.  Eyes: Conjunctivae are normal. Pupils are equal, round, and reactive to light. Right eye exhibits no discharge. Left eye exhibits no discharge. No scleral icterus.  Neck: Normal range of motion. Neck supple. No thyromegaly present.  Cardiovascular: Normal rate, regular rhythm, normal heart sounds and intact distal pulses.  Exam reveals no gallop and no friction rub.  No murmur heard. Pulmonary/Chest: Effort normal. No stridor. No respiratory distress. She has wheezes (faint, expiratory on the L). She has no rales.  Abdominal: Soft. Bowel sounds are normal. She exhibits no distension. There is tenderness in the right lower quadrant. There is no rebound and no guarding.  Musculoskeletal: She exhibits no edema.  Tenderness to palpation of left fifth toe no ecchymosis or deformity noted  Feet:  Left Foot:  Skin Integrity: Positive for blister (plantar surface below 4th toe).  Lymphadenopathy:    She has no cervical adenopathy.  Neurological: She is alert. Coordination normal.  Skin: Skin is warm and dry. No rash noted. She is not diaphoretic. No pallor.  Psychiatric: She has a normal  mood and affect.  Nursing note and vitals reviewed.    ED Treatments / Results  Labs (all labs ordered are listed, but only abnormal results are displayed) Labs Reviewed  COMPREHENSIVE METABOLIC PANEL - Abnormal; Notable for the following components:      Result Value   Glucose, Bld 136 (*)    Calcium 7.9 (*)    Total Protein 5.5 (*)    Albumin 2.5 (*)    AST 10 (*)    ALT 10 (*)    All other components within normal limits  CBC - Abnormal; Notable for the following components:   WBC 23.1 (*)    Hemoglobin 11.9 (*)    MCH 24.4 (*)    RDW 18.5 (*)    All other components within normal limits  URINALYSIS, ROUTINE W REFLEX MICROSCOPIC - Abnormal; Notable for the following components:   Specific Gravity, Urine >1.030 (*)    Bilirubin Urine SMALL (*)    All other components within normal limits  CULTURE, BLOOD (ROUTINE X 2)  CULTURE, BLOOD (ROUTINE X 2)  LIPASE, BLOOD  INFLUENZA PANEL BY PCR (TYPE A & B)  I-STAT CG4 LACTIC ACID, ED    EKG  EKG Interpretation  Date/Time:  Saturday September 24 2017 11:53:37 EST Ventricular Rate:  104 PR Interval:    QRS Duration: 102 QT Interval:  319 QTC Calculation: 420 R  Axis:   -31 Text Interpretation:  Sinus tachycardia Ventricular premature complex Left axis deviation Low voltage, precordial leads Confirmed by Cathren Laine ([id number]) on 09/24/2017 12:04:40 PM       Radiology Dg Chest 2 View  Result Date: 09/24/2017 CLINICAL DATA:  Cough, congestion EXAM: CHEST  2 VIEW COMPARISON:  09/22/2016 FINDINGS: Heart is borderline in size. Lingular scarring. Mild vascular congestion. No overt edema or acute opacities. No effusions or acute bony abnormality. IMPRESSION: Borderline heart size.  Mild vascular congestion. Lingular scarring. Electronically Signed   By: Charlett Nose M.D.   On: 09/24/2017 12:37   Ct Chest W Contrast  Result Date: 09/24/2017 CLINICAL DATA:  Diabetic patient with generalized weakness. Nausea and vomiting. EXAM: CT CHEST, ABDOMEN, AND PELVIS WITH CONTRAST TECHNIQUE: Multidetector CT imaging of the chest, abdomen and pelvis was performed following the standard protocol during bolus administration of intravenous contrast. CONTRAST:  ISOVUE-300 IOPAMIDOL (ISOVUE-300) INJECTION 61% COMPARISON:  Abdomen CT April 26, 2015 FINDINGS: CT CHEST FINDINGS Cardiovascular: The heart size is borderline. No obvious coronary artery calcifications. The main pulmonary artery measures 3.6 cm. No filling defects in the pulmonary arteries on limited images. The thoracic aorta is normal in caliber. Taking motion artifact into account, no dissection. There is mild atherosclerosis in the arch. A superior aortic recess is identified with fluid adjacent to the aorta, extending inferiorly. The underlying aorta is normal in appearance. Mediastinum/Nodes: Tiny nodules in the thyroid of doubtful significance. The esophagus is normal in appearance. No adenopathy. No effusions. Lungs/Pleura: Central airways are normal. No pneumothorax. Scattered atelectasis. No pulmonary nodules, masses, or focal infiltrates. Musculoskeletal: See below. CT ABDOMEN PELVIS FINDINGS Hepatobiliary: The  patient is status post hepatic steatosis. No biliary duct dilatation. Limited views of the pulmonary arteries are normal with opacification. No liver masses are seen. Pancreas: Unremarkable. No pancreatic ductal dilatation or surrounding inflammatory changes. Spleen: Normal in size without focal abnormality. Adrenals/Urinary Tract: There is a probable cyst in the lower left kidney, too small to characterize. No hydronephrosis, suspicious mass, or stone. No ureterectasis or ureteral stone. The bladder is normal.  Stomach/Bowel: The stomach and small bowel are normal. A few scattered colonic diverticuli are identified without diverticulitis. The appendix is not seen but there is no evidence of appendicitis. By report, the patient is status post appendectomy. Vascular/Lymphatic: The abdominal aorta is normal in caliber with no dissection. No adenopathy. Reproductive: Status post hysterectomy. No adnexal masses. Other: No abdominal wall hernia or abnormality. No abdominopelvic ascites. Musculoskeletal: No acute or significant osseous findings. IMPRESSION: 1. No acute abnormalities identified to explain the patient's symptoms. 2. The main pulmonary artery measures 3.6 cm raising the possibility of pulmonary arterial hypertension. 3. 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 Aortic Atherosclerosis (ICD10-I70.0). Electronically Signed   By: Gerome Sam III M.D   On: 09/24/2017 15:51   Ct Abdomen Pelvis W Contrast  Result Date: 09/24/2017 CLINICAL DATA:  Diabetic patient with generalized weakness. Nausea and vomiting. EXAM: CT CHEST, ABDOMEN, AND PELVIS WITH CONTRAST TECHNIQUE: Multidetector CT imaging of the chest, abdomen and pelvis was performed following the standard protocol during bolus administration of intravenous contrast. CONTRAST:  ISOVUE-300 IOPAMIDOL (ISOVUE-300) INJECTION 61% COMPARISON:  Abdomen CT April 26, 2015 FINDINGS: CT CHEST FINDINGS Cardiovascular: The heart size is borderline. No obvious  coronary artery calcifications. The main pulmonary artery measures 3.6 cm. No filling defects in the pulmonary arteries on limited images. The thoracic aorta is normal in caliber. Taking motion artifact into account, no dissection. There is mild atherosclerosis in the arch. A superior aortic recess is identified with fluid adjacent to the aorta, extending inferiorly. The underlying aorta is normal in appearance. Mediastinum/Nodes: Tiny nodules in the thyroid of doubtful significance. The esophagus is normal in appearance. No adenopathy. No effusions. Lungs/Pleura: Central airways are normal. No pneumothorax. Scattered atelectasis. No pulmonary nodules, masses, or focal infiltrates. Musculoskeletal: See below. CT ABDOMEN PELVIS FINDINGS Hepatobiliary: The patient is status post hepatic steatosis. No biliary duct dilatation. Limited views of the pulmonary arteries are normal with opacification. No liver masses are seen. Pancreas: Unremarkable. No pancreatic ductal dilatation or surrounding inflammatory changes. Spleen: Normal in size without focal abnormality. Adrenals/Urinary Tract: There is a probable cyst in the lower left kidney, too small to characterize. No hydronephrosis, suspicious mass, or stone. No ureterectasis or ureteral stone. The bladder is normal. Stomach/Bowel: The stomach and small bowel are normal. A few scattered colonic diverticuli are identified without diverticulitis. The appendix is not seen but there is no evidence of appendicitis. By report, the patient is status post appendectomy. Vascular/Lymphatic: The abdominal aorta is normal in caliber with no dissection. No adenopathy. Reproductive: Status post hysterectomy. No adnexal masses. Other: No abdominal wall hernia or abnormality. No abdominopelvic ascites. Musculoskeletal: No acute or significant osseous findings. IMPRESSION: 1. No acute abnormalities identified to explain the patient's symptoms. 2. The main pulmonary artery measures 3.6 cm  raising the possibility of pulmonary arterial hypertension. 3. 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 Aortic Atherosclerosis (ICD10-I70.0). Electronically Signed   By: Gerome Sam III M.D   On: 09/24/2017 15:51   Dg Toe 5th Left  Result Date: 09/24/2017 CLINICAL DATA:  Hit left fifth toe yesterday.  Pain.  Bruising EXAM: DG TOE 5TH LEFT COMPARISON:  None. FINDINGS: Lucency at the base of the proximal phalanx of the left little toe compatible with fracture. No subluxation or dislocation. IMPRESSION: Minimally displaced fracture at the base of the proximal phalanx left little toe. Electronically Signed   By: Charlett Nose M.D.   On: 09/24/2017 12:54    Procedures Procedures (including critical care time)  Medications Ordered in  ED Medications  ipratropium-albuterol (DUONEB) 0.5-2.5 (3) MG/3ML nebulizer solution 3 mL (3 mLs Nebulization Given 09/24/17 1316)  vancomycin (VANCOCIN) IVPB 1000 mg/200 mL premix (1,000 mg Intravenous New Bag/Given 09/24/17 1651)  vancomycin (VANCOCIN) IVPB 1000 mg/200 mL premix (not administered)  piperacillin-tazobactam (ZOSYN) IVPB 3.375 g (not administered)  ondansetron (ZOFRAN-ODT) disintegrating tablet 4 mg (4 mg Oral Given 09/24/17 1201)  sodium chloride 0.9 % bolus 500 mL (0 mLs Intravenous Stopped 09/24/17 1321)  sodium chloride 0.9 % bolus 500 mL (0 mLs Intravenous Stopped 09/24/17 1628)  acetaminophen (TYLENOL) tablet 1,000 mg (1,000 mg Oral Given 09/24/17 1407)  piperacillin-tazobactam (ZOSYN) IVPB 3.375 g (0 g Intravenous Stopped 09/24/17 1535)  vancomycin (VANCOCIN) IVPB 1000 mg/200 mL premix (0 mg Intravenous Stopped 09/24/17 1650)  iopamidol (ISOVUE-300) 61 % injection 100 mL (100 mLs Intravenous Contrast Given 09/24/17 1454)  oseltamivir (TAMIFLU) capsule 75 mg (75 mg Oral Given 09/24/17 1723)  sodium chloride 0.9 % bolus 1,000 mL (1,000 mLs Intravenous New Bag/Given 09/24/17 1651)     Initial Impression / Assessment and Plan / ED Course  I have reviewed the  triage vital signs and the nursing notes.  Pertinent labs & imaging results that were available during my care of the patient were reviewed by me and considered in my medical decision making (see chart for details).     Patient with cough, generalized weakness, diarrhea, fever, leukocytosis of 23K.  Lactate is within normal limits.  Chest x-ray is negative, as well as CT of the chest, abdomen, pelvis.  Urine also negative.  Suspect possible flu.  Antibiotics for unknown source (vancomycin and Zosyn) initiated prior to lactic concern for sepsis.  Lactate was within normal limits.  Patient continues to be mildly tachycardic with some decrease in blood pressure since arrival.  1500 mL bolus given.  Flu swab and blood cultures sent and pending.  I consulted [facility] and spoke with with Dr. Gwenlyn Perking who will admit the patient.  He recommended a dose of Tamiflu in the ED.  Patient also evaluated by Dr. Denton Lank who guided the patient's management and agrees with plan.  Final Clinical Impressions(s) / ED Diagnoses   Final diagnoses:  Leukocytosis, unspecified type  Cough  Generalized weakness    ED Discharge Orders    None       Emi Holes, PA-C 09/24/17 1731    Cathren Laine, MD 09/25/17 [phone]

## 2017-09-24 NOTE — ED Notes (Signed)
Patient transported to X-ray 

## 2017-09-25 DIAGNOSIS — E785 Hyperlipidemia, unspecified: Secondary | ICD-10-CM

## 2017-09-25 DIAGNOSIS — R109 Unspecified abdominal pain: Secondary | ICD-10-CM | POA: Diagnosis present

## 2017-09-25 LAB — RESPIRATORY PANEL BY PCR
ADENOVIRUS-RVPPCR: NOT DETECTED
Bordetella pertussis: NOT DETECTED
CHLAMYDOPHILA PNEUMONIAE-RVPPCR: NOT DETECTED
CORONAVIRUS HKU1-RVPPCR: NOT DETECTED
CORONAVIRUS NL63-RVPPCR: NOT DETECTED
Coronavirus 229E: NOT DETECTED
Coronavirus OC43: NOT DETECTED
Influenza A: NOT DETECTED
Influenza B: NOT DETECTED
Metapneumovirus: NOT DETECTED
Mycoplasma pneumoniae: NOT DETECTED
PARAINFLUENZA VIRUS 1-RVPPCR: NOT DETECTED
PARAINFLUENZA VIRUS 2-RVPPCR: NOT DETECTED
PARAINFLUENZA VIRUS 3-RVPPCR: NOT DETECTED
Parainfluenza Virus 4: NOT DETECTED
RHINOVIRUS / ENTEROVIRUS - RVPPCR: NOT DETECTED
Respiratory Syncytial Virus: NOT DETECTED

## 2017-09-25 LAB — CREATININE, SERUM: CREATININE: 0.76 mg/dL (ref 0.44–1.00)

## 2017-09-25 LAB — GLUCOSE, CAPILLARY
GLUCOSE-CAPILLARY: 198 mg/dL — AB (ref 65–99)
GLUCOSE-CAPILLARY: 228 mg/dL — AB (ref 65–99)
Glucose-Capillary: 106 mg/dL — ABNORMAL HIGH (ref 65–99)
Glucose-Capillary: 152 mg/dL — ABNORMAL HIGH (ref 65–99)
Glucose-Capillary: 164 mg/dL — ABNORMAL HIGH (ref 65–99)

## 2017-09-25 LAB — CBC
HEMATOCRIT: 36.7 % (ref 36.0–46.0)
HEMOGLOBIN: 11.4 g/dL — AB (ref 12.0–15.0)
MCH: 25.4 pg — AB (ref 26.0–34.0)
MCHC: 31.1 g/dL (ref 30.0–36.0)
MCV: 81.9 fL (ref 78.0–100.0)
Platelets: 179 10*3/uL (ref 150–400)
RBC: 4.48 MIL/uL (ref 3.87–5.11)
RDW: 18.3 % — ABNORMAL HIGH (ref 11.5–15.5)
WBC: 21.2 10*3/uL — ABNORMAL HIGH (ref 4.0–10.5)

## 2017-09-25 LAB — BRAIN NATRIURETIC PEPTIDE: B NATRIURETIC PEPTIDE 5: 38.6 pg/mL (ref 0.0–100.0)

## 2017-09-25 LAB — LACTIC ACID, PLASMA
LACTIC ACID, VENOUS: 0.7 mmol/L (ref 0.5–1.9)
Lactic Acid, Venous: 0.8 mmol/L (ref 0.5–1.9)

## 2017-09-25 LAB — PROCALCITONIN: Procalcitonin: 0.3 ng/mL

## 2017-09-25 LAB — MRSA PCR SCREENING: MRSA BY PCR: POSITIVE — AB

## 2017-09-25 LAB — HIV ANTIBODY (ROUTINE TESTING W REFLEX): HIV SCREEN 4TH GENERATION: NONREACTIVE

## 2017-09-25 LAB — STREP PNEUMONIAE URINARY ANTIGEN: Strep Pneumo Urinary Antigen: NEGATIVE

## 2017-09-25 MED ORDER — VANCOMYCIN HCL IN DEXTROSE 750-5 MG/150ML-% IV SOLN
750.0000 mg | Freq: Two times a day (BID) | INTRAVENOUS | Status: DC
  Administered 2017-09-25 – 2017-09-26 (×2): 750 mg via INTRAVENOUS
  Filled 2017-09-25 (×2): qty 150

## 2017-09-25 MED ORDER — DM-GUAIFENESIN ER 30-600 MG PO TB12
1.0000 | ORAL_TABLET | Freq: Two times a day (BID) | ORAL | Status: DC
  Administered 2017-09-25 – 2017-09-27 (×7): 1 via ORAL
  Filled 2017-09-25 (×7): qty 1

## 2017-09-25 MED ORDER — ONDANSETRON 4 MG PO TBDP: 4.0000 mg | ORAL_TABLET | Freq: Three times a day (TID) | ORAL | Status: DC | PRN

## 2017-09-25 MED ORDER — MUPIROCIN 2 % EX OINT
1.0000 "application " | TOPICAL_OINTMENT | Freq: Two times a day (BID) | CUTANEOUS | Status: AC
  Administered 2017-09-25 – 2017-09-29 (×10): 1 via NASAL
  Filled 2017-09-25: qty 22

## 2017-09-25 MED ORDER — ADULT MULTIVITAMIN W/MINERALS CH
ORAL_TABLET | Freq: Every day | ORAL | Status: DC
  Administered 2017-09-25 – 2017-10-01 (×7): 1 via ORAL
  Filled 2017-09-25 (×7): qty 1

## 2017-09-25 MED ORDER — IPRATROPIUM-ALBUTEROL 0.5-2.5 (3) MG/3ML IN SOLN: 3.0000 mL | RESPIRATORY_TRACT | Status: DC

## 2017-09-25 MED ORDER — INSULIN GLARGINE 100 UNIT/ML ~~LOC~~ SOLN
50.0000 [IU] | Freq: Every day | SUBCUTANEOUS | Status: DC
  Administered 2017-09-25 – 2017-09-30 (×6): 50 [IU] via SUBCUTANEOUS
  Filled 2017-09-25 (×7): qty 0.5

## 2017-09-25 MED ORDER — TRAMADOL HCL 50 MG PO TABS
50.0000 mg | ORAL_TABLET | Freq: Three times a day (TID) | ORAL | Status: DC
  Administered 2017-09-25 – 2017-10-01 (×19): 50 mg via ORAL
  Filled 2017-09-25 (×19): qty 1

## 2017-09-25 MED ORDER — ALBUTEROL SULFATE (2.5 MG/3ML) 0.083% IN NEBU
2.5000 mg | INHALATION_SOLUTION | RESPIRATORY_TRACT | Status: DC | PRN
  Administered 2017-09-25: 2.5 mg via RESPIRATORY_TRACT
  Filled 2017-09-25: qty 3

## 2017-09-25 MED ORDER — INSULIN ASPART 100 UNIT/ML ~~LOC~~ SOLN
0.0000 [IU] | Freq: Three times a day (TID) | SUBCUTANEOUS | Status: DC
  Administered 2017-09-25: 3 [IU] via SUBCUTANEOUS
  Administered 2017-09-25: 2 [IU] via SUBCUTANEOUS
  Administered 2017-09-26: 3 [IU] via SUBCUTANEOUS
  Administered 2017-09-26: 2 [IU] via SUBCUTANEOUS
  Administered 2017-09-26: 3 [IU] via SUBCUTANEOUS
  Administered 2017-09-27: 5 [IU] via SUBCUTANEOUS
  Administered 2017-09-27: 2 [IU] via SUBCUTANEOUS
  Administered 2017-09-28: 3 [IU] via SUBCUTANEOUS
  Administered 2017-09-28: 2 [IU] via SUBCUTANEOUS
  Administered 2017-09-29: 3 [IU] via SUBCUTANEOUS
  Administered 2017-09-29: 2 [IU] via SUBCUTANEOUS
  Administered 2017-09-30: 1 [IU] via SUBCUTANEOUS
  Administered 2017-09-30 (×2): 3 [IU] via SUBCUTANEOUS

## 2017-09-25 MED ORDER — ENOXAPARIN SODIUM 40 MG/0.4ML ~~LOC~~ SOLN
40.0000 mg | SUBCUTANEOUS | Status: DC
  Administered 2017-09-25 – 2017-09-27 (×3): 40 mg via SUBCUTANEOUS
  Filled 2017-09-25 (×3): qty 0.4

## 2017-09-25 MED ORDER — MUPIROCIN 2 % EX OINT
TOPICAL_OINTMENT | CUTANEOUS | Status: AC
  Filled 2017-09-25: qty 22

## 2017-09-25 MED ORDER — ASPIRIN EC 81 MG PO TBEC
81.0000 mg | DELAYED_RELEASE_TABLET | Freq: Every day | ORAL | Status: DC
  Administered 2017-09-25 – 2017-10-01 (×7): 81 mg via ORAL
  Filled 2017-09-25 (×7): qty 1

## 2017-09-25 MED ORDER — METHYLPREDNISOLONE SODIUM SUCC 125 MG IJ SOLR
60.0000 mg | Freq: Three times a day (TID) | INTRAMUSCULAR | Status: DC
  Administered 2017-09-25 (×3): 60 mg via INTRAVENOUS
  Administered 2017-09-26: 62.5 mg via INTRAVENOUS
  Filled 2017-09-25 (×4): qty 2

## 2017-09-25 MED ORDER — MORPHINE SULFATE (PF) 4 MG/ML IV SOLN
2.0000 mg | INTRAVENOUS | Status: DC | PRN
  Administered 2017-09-25: 2 mg via INTRAVENOUS
  Filled 2017-09-25: qty 1

## 2017-09-25 MED ORDER — IPRATROPIUM-ALBUTEROL 0.5-2.5 (3) MG/3ML IN SOLN
3.0000 mL | Freq: Three times a day (TID) | RESPIRATORY_TRACT | Status: DC
  Administered 2017-09-25 – 2017-09-28 (×7): 3 mL via RESPIRATORY_TRACT
  Filled 2017-09-25 (×9): qty 3

## 2017-09-25 MED ORDER — TIZANIDINE HCL 4 MG PO TABS
4.0000 mg | ORAL_TABLET | Freq: Three times a day (TID) | ORAL | Status: DC
  Administered 2017-09-25 – 2017-09-30 (×18): 4 mg via ORAL
  Filled 2017-09-25 (×19): qty 1

## 2017-09-25 MED ORDER — MIRABEGRON ER 25 MG PO TB24
50.0000 mg | ORAL_TABLET | Freq: Every day | ORAL | Status: DC
  Administered 2017-09-25 – 2017-10-01 (×4): 50 mg via ORAL
  Filled 2017-09-25 (×7): qty 2

## 2017-09-25 MED ORDER — SODIUM CHLORIDE 0.9 % IV BOLUS (SEPSIS)
1000.0000 mL | Freq: Once | INTRAVENOUS | Status: AC
  Administered 2017-09-25: 1000 mL via INTRAVENOUS

## 2017-09-25 MED ORDER — FERROUS SULFATE 325 (65 FE) MG PO TABS: 325.0000 mg | ORAL_TABLET | Freq: Every day | ORAL | Status: DC

## 2017-09-25 MED ORDER — CHLORHEXIDINE GLUCONATE CLOTH 2 % EX PADS
6.0000 | MEDICATED_PAD | Freq: Every day | CUTANEOUS | Status: AC
  Administered 2017-09-25 – 2017-09-29 (×3): 6 via TOPICAL

## 2017-09-25 MED ORDER — ATORVASTATIN CALCIUM 40 MG PO TABS: 40.0000 mg | ORAL_TABLET | Freq: Every day | ORAL | Status: DC

## 2017-09-25 MED ORDER — PANTOPRAZOLE SODIUM 40 MG PO TBEC
40.0000 mg | DELAYED_RELEASE_TABLET | Freq: Every day | ORAL | Status: DC
  Administered 2017-09-25 – 2017-10-01 (×7): 40 mg via ORAL
  Filled 2017-09-25 (×7): qty 1

## 2017-09-25 MED ORDER — MONTELUKAST SODIUM 10 MG PO TABS
10.0000 mg | ORAL_TABLET | Freq: Every day | ORAL | Status: DC
  Administered 2017-09-25 – 2017-09-30 (×6): 10 mg via ORAL
  Filled 2017-09-25 (×6): qty 1

## 2017-09-25 MED ORDER — FLUTICASONE PROPIONATE 50 MCG/ACT NA SUSP
2.0000 | Freq: Every day | NASAL | Status: DC
  Administered 2017-09-25 – 2017-09-30 (×6): 2 via NASAL
  Filled 2017-09-25 (×2): qty 16

## 2017-09-25 MED ORDER — NICOTINE 21 MG/24HR TD PT24
21.0000 mg | MEDICATED_PATCH | Freq: Every day | TRANSDERMAL | Status: DC
  Administered 2017-09-26: 21 mg via TRANSDERMAL
  Filled 2017-09-25 (×4): qty 1

## 2017-09-25 MED ORDER — MELOXICAM 15 MG PO TABS: 15.0000 mg | ORAL_TABLET | Freq: Every day | ORAL | Status: DC

## 2017-09-25 NOTE — Progress Notes (Signed)
Pharmacy Antibiotic Note  Kelli Rivera is a 66 y.o. female admitted on 09/24/2017 with sepsis.  Pharmacy has been consulted for Zosyn and Vancomycin dosing. (Augmentin causes n/v-not true allergy).    Plan: Adjust Vancomycin dose to 750mg  IV q12h to keep AUC 400-500. Continue Zosyn 3.375g IV q8h (4 hour infusion). Monitor renal function (daily SCr), cultures, clinical course.   Height: 5\' 4"  (162.6 cm) Weight: 278 lb (126.1 kg) IBW/kg (Calculated) : 54.7  Temp (24hrs), Avg:99.3 F (37.4 C), Min:98.4 F (36.9 C), Max:101 F (38.3 C)  Recent Labs  Lab 09/24/17 1152 09/24/17 1405 09/25/17 0322 09/25/17 0806 09/25/17 1452  WBC 23.1*  --   --  21.2*  --   CREATININE 0.83  --   --   --  0.76  LATICACIDVEN  --  1.18 0.7 0.8  --     Estimated Creatinine Clearance: 89.7 mL/min (by C-G formula based on SCr of 0.76 mg/dL).    Allergies  Allergen Reactions  . Liraglutide Nausea And Vomiting  . Pioglitazone Swelling  . Amoxicillin-Pot Clavulanate Nausea And Vomiting    Antimicrobials this admission: Zosyn 12/1 >>  Vancomycin 12/1 >>   Microbiology results: 12/1 BCx: NGTD 12/1 MRSA PCR: positive  12/1 influenza PCR: negative 12/2 respiratory panel by PCR: negative 12/2 Strep pneumo ur ag: negative   Thank you for allowing pharmacy to be a part of this patient's care.   14/2, PharmD, BCPS Pager: [phone] 09/25/2017 4:43 PM

## 2017-09-25 NOTE — Progress Notes (Addendum)
PROGRESS NOTE    Kelli Rivera  RAQ:[id number] DOB: [birth year] DOA: 09/24/2017 PCP: Angelica Chessman, MD   Brief Narrative: 66 year old female with history of diabetes, hypertension, COPD with chronic hypoxia on 2 L of oxygen at home, asthma, GERD, obesity, iron deficiency anemia presented with shortness of breath, cough productive, fever chills and left fifth toe injury causing fracture.  Assessment & Plan:   #COPD with acute exacerbation and sepsis: Patient with fever, productive cough, shortness of breath and wheezing, leukocytosis. -Plan to continue bronchodilators, antibiotics, Solu-Medrol IV.  Follow-up lab results, culture results.  Infection negative.  Follow-up respiratory viral studies.  Lactate level trended down -Continue supportive care.  #Respiratory failure with chronic hypoxia: Continue 2 L of oxygen.  #Hyperlipidemia: Continue Lipitor.  #Type 2 diabetes in obese patient: Continue current insulin and  #Injury and left fifth toe minimally displaced fracture: Continue supportive care.  Orthopedics consult requested and discussed with Dr. Ranell Patrick.  #Diarrhea and abdominal pain: CT scan of abdomen pelvis negative.  Lipase normal.  Likely viral etiology.  Continue supportive care.  Denied vomiting today.  #Tobacco abuse: Continue nicotine patch.  Patient was counseled.   DVT prophylaxis: Lovenox subcutaneous Code Status: Full code Family Communication: Discussed with the patient's daughter at bedside Disposition Plan: Stepdown    Consultants:   Orthopedics  Procedures: None Antimicrobials: Vanco and Zosyn.  Subjective: Seen and examined at bedside.  Has shortness of breath, cough and generalized weakness.  Mild nausea but no vomiting.  No abdominal pain.  Objective: Vitals:   09/25/17 0400 09/25/17 0500 09/25/17 0600 09/25/17 0800  BP: (!) 112/59 (!) 126/58 (!) 119/52 130/61  Pulse: 92 92 88 99  Resp: 20 (!) 22 19 (!) 23  Temp: 99.5 F (37.5 C)   (!) 101  F (38.3 C)  TempSrc: Axillary   Oral  SpO2:    97%  Weight:      Height:        Intake/Output Summary (Last 24 hours) at 09/25/2017 1034 Last data filed at 09/25/2017 0544 Gross per 24 hour  Intake 2891.25 ml  Output 825 ml  Net 2066.25 ml   Filed Weights   09/24/17 2200  Weight: 126.1 kg (278 lb)    Examination:  General exam: Looks ill with mild increased work of breathing. Respiratory system: Bilateral diffuse expiratory wheeze with occasional crackle.  cardiovascular system: S1 & S2 heard, RRR.  No pedal edema. Gastrointestinal system: Abdomen is nondistended, soft and nontender. Normal bowel sounds heard. Central nervous system: Alert and oriented. No focal neurological deficits. Extremities: Symmetric 5 x 5 power. Skin: No rashes, lesions or ulcers Psychiatry: Judgement and insight appear normal. Mood & affect appropriate.     Data Reviewed: I have personally reviewed following labs and imaging studies  CBC: Recent Labs  Lab 09/24/17 1152 09/25/17 0806  WBC 23.1* 21.2*  HGB 11.9* 11.4*  HCT 38.9 36.7  MCV 79.9 81.9  PLT 220 179   Basic Metabolic Panel: Recent Labs  Lab 09/24/17 1152  NA 139  K 3.7  CL 107  CO2 27  GLUCOSE 136*  BUN 8  CREATININE 0.83  CALCIUM 7.9*   GFR: Estimated Creatinine Clearance: 86.5 mL/min (by C-G formula based on SCr of 0.83 mg/dL). Liver Function Tests: Recent Labs  Lab 09/24/17 1152  AST 10*  ALT 10*  ALKPHOS 82  BILITOT 0.4  PROT 5.5*  ALBUMIN 2.5*   Recent Labs  Lab 09/24/17 1152  LIPASE 22   No results  for input(s): AMMONIA in the last 168 hours. Coagulation Profile: No results for input(s): INR, PROTIME in the last 168 hours. Cardiac Enzymes: No results for input(s): CKTOTAL, CKMB, CKMBINDEX, TROPONINI in the last 168 hours. BNP (last 3 results) No results for input(s): PROBNP in the last 8760 hours. HbA1C: No results for input(s): HGBA1C in the last 72 hours. CBG: Recent Labs  Lab  09/24/17 2214 09/25/17 0809  GLUCAP 138* 106*   Lipid Profile: No results for input(s): CHOL, HDL, LDLCALC, TRIG, CHOLHDL, LDLDIRECT in the last 72 hours. Thyroid Function Tests: No results for input(s): TSH, T4TOTAL, FREET4, T3FREE, THYROIDAB in the last 72 hours. Anemia Panel: No results for input(s): VITAMINB12, FOLATE, FERRITIN, TIBC, IRON, RETICCTPCT in the last 72 hours. Sepsis Labs: Recent Labs  Lab 09/24/17 1405 09/25/17 0322 09/25/17 0806  PROCALCITON  --  0.30  --   LATICACIDVEN 1.18 0.7 0.8    Recent Results (from the past 240 hour(s))  MRSA PCR Screening     Status: Abnormal   Collection Time: 09/24/17  9:37 PM  Result Value Ref Range Status   MRSA by PCR POSITIVE (A) NEGATIVE Final    Comment:        The GeneXpert MRSA Assay (FDA approved for NASAL specimens only), is one component of a comprehensive MRSA colonization surveillance program. It is not intended to diagnose MRSA infection nor to guide or monitor treatment for MRSA infections. RESULT CALLED TO, READ BACK BY AND VERIFIED WITH: R MCINTOSH AT 0333 ON 12.02.2018 BY NBROOKS          Radiology Studies: Dg Chest 2 View  Result Date: 09/24/2017 CLINICAL DATA:  Cough, congestion EXAM: CHEST  2 VIEW COMPARISON:  09/22/2016 FINDINGS: Heart is borderline in size. Lingular scarring. Mild vascular congestion. No overt edema or acute opacities. No effusions or acute bony abnormality. IMPRESSION: Borderline heart size.  Mild vascular congestion. Lingular scarring. Electronically Signed   By: Charlett Nose M.D.   On: 09/24/2017 12:37   Ct Chest W Contrast  Result Date: 09/24/2017 CLINICAL DATA:  Diabetic patient with generalized weakness. Nausea and vomiting. EXAM: CT CHEST, ABDOMEN, AND PELVIS WITH CONTRAST TECHNIQUE: Multidetector CT imaging of the chest, abdomen and pelvis was performed following the standard protocol during bolus administration of intravenous contrast. CONTRAST:  ISOVUE-300  IOPAMIDOL (ISOVUE-300) INJECTION 61% COMPARISON:  Abdomen CT April 26, 2015 FINDINGS: CT CHEST FINDINGS Cardiovascular: The heart size is borderline. No obvious coronary artery calcifications. The main pulmonary artery measures 3.6 cm. No filling defects in the pulmonary arteries on limited images. The thoracic aorta is normal in caliber. Taking motion artifact into account, no dissection. There is mild atherosclerosis in the arch. A superior aortic recess is identified with fluid adjacent to the aorta, extending inferiorly. The underlying aorta is normal in appearance. Mediastinum/Nodes: Tiny nodules in the thyroid of doubtful significance. The esophagus is normal in appearance. No adenopathy. No effusions. Lungs/Pleura: Central airways are normal. No pneumothorax. Scattered atelectasis. No pulmonary nodules, masses, or focal infiltrates. Musculoskeletal: See below. CT ABDOMEN PELVIS FINDINGS Hepatobiliary: The patient is status post hepatic steatosis. No biliary duct dilatation. Limited views of the pulmonary arteries are normal with opacification. No liver masses are seen. Pancreas: Unremarkable. No pancreatic ductal dilatation or surrounding inflammatory changes. Spleen: Normal in size without focal abnormality. Adrenals/Urinary Tract: There is a probable cyst in the lower left kidney, too small to characterize. No hydronephrosis, suspicious mass, or stone. No ureterectasis or ureteral stone. The bladder is normal. Stomach/Bowel:  The stomach and small bowel are normal. A few scattered colonic diverticuli are identified without diverticulitis. The appendix is not seen but there is no evidence of appendicitis. By report, the patient is status post appendectomy. Vascular/Lymphatic: The abdominal aorta is normal in caliber with no dissection. No adenopathy. Reproductive: Status post hysterectomy. No adnexal masses. Other: No abdominal wall hernia or abnormality. No abdominopelvic ascites. Musculoskeletal: No acute or  significant osseous findings. IMPRESSION: 1. No acute abnormalities identified to explain the patient's symptoms. 2. The main pulmonary artery measures 3.6 cm raising the possibility of pulmonary arterial hypertension. 3. 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 Aortic Atherosclerosis (ICD10-I70.0). Electronically Signed   By: Gerome Sam III M.D   On: 09/24/2017 15:51   Ct Abdomen Pelvis W Contrast  Result Date: 09/24/2017 CLINICAL DATA:  Diabetic patient with generalized weakness. Nausea and vomiting. EXAM: CT CHEST, ABDOMEN, AND PELVIS WITH CONTRAST TECHNIQUE: Multidetector CT imaging of the chest, abdomen and pelvis was performed following the standard protocol during bolus administration of intravenous contrast. CONTRAST:  ISOVUE-300 IOPAMIDOL (ISOVUE-300) INJECTION 61% COMPARISON:  Abdomen CT April 26, 2015 FINDINGS: CT CHEST FINDINGS Cardiovascular: The heart size is borderline. No obvious coronary artery calcifications. The main pulmonary artery measures 3.6 cm. No filling defects in the pulmonary arteries on limited images. The thoracic aorta is normal in caliber. Taking motion artifact into account, no dissection. There is mild atherosclerosis in the arch. A superior aortic recess is identified with fluid adjacent to the aorta, extending inferiorly. The underlying aorta is normal in appearance. Mediastinum/Nodes: Tiny nodules in the thyroid of doubtful significance. The esophagus is normal in appearance. No adenopathy. No effusions. Lungs/Pleura: Central airways are normal. No pneumothorax. Scattered atelectasis. No pulmonary nodules, masses, or focal infiltrates. Musculoskeletal: See below. CT ABDOMEN PELVIS FINDINGS Hepatobiliary: The patient is status post hepatic steatosis. No biliary duct dilatation. Limited views of the pulmonary arteries are normal with opacification. No liver masses are seen. Pancreas: Unremarkable. No pancreatic ductal dilatation or surrounding inflammatory changes. Spleen:  Normal in size without focal abnormality. Adrenals/Urinary Tract: There is a probable cyst in the lower left kidney, too small to characterize. No hydronephrosis, suspicious mass, or stone. No ureterectasis or ureteral stone. The bladder is normal. Stomach/Bowel: The stomach and small bowel are normal. A few scattered colonic diverticuli are identified without diverticulitis. The appendix is not seen but there is no evidence of appendicitis. By report, the patient is status post appendectomy. Vascular/Lymphatic: The abdominal aorta is normal in caliber with no dissection. No adenopathy. Reproductive: Status post hysterectomy. No adnexal masses. Other: No abdominal wall hernia or abnormality. No abdominopelvic ascites. Musculoskeletal: No acute or significant osseous findings. IMPRESSION: 1. No acute abnormalities identified to explain the patient's symptoms. 2. The main pulmonary artery measures 3.6 cm raising the possibility of pulmonary arterial hypertension. 3. 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 Aortic Atherosclerosis (ICD10-I70.0). Electronically Signed   By: Gerome Sam III M.D   On: 09/24/2017 15:51   Dg Toe 5th Left  Result Date: 09/24/2017 CLINICAL DATA:  Hit left fifth toe yesterday.  Pain.  Bruising EXAM: DG TOE 5TH LEFT COMPARISON:  None. FINDINGS: Lucency at the base of the proximal phalanx of the left little toe compatible with fracture. No subluxation or dislocation. IMPRESSION: Minimally displaced fracture at the base of the proximal phalanx left little toe. Electronically Signed   By: Charlett Nose M.D.   On: 09/24/2017 12:54        Scheduled Meds: . aspirin EC  81  mg Oral Daily  . Chlorhexidine Gluconate Cloth  6 each Topical Q0600  . dextromethorphan-guaiFENesin  1 tablet Oral BID  . enoxaparin (LOVENOX) injection  40 mg Subcutaneous Q24H  . fluticasone  2 spray Each Nare Daily  . insulin aspart  0-9 Units Subcutaneous TID WC  . insulin glargine  50 Units Subcutaneous QHS  .  ipratropium-albuterol  3 mL Nebulization TID  . methylPREDNISolone (SOLU-MEDROL) injection  60 mg Intravenous TID  . mirabegron ER  50 mg Oral Daily  . montelukast  10 mg Oral QHS  . multivitamin with minerals   Oral Daily  . mupirocin ointment  1 application Nasal BID  . mupirocin ointment      . nicotine  21 mg Transdermal Daily  . pantoprazole  40 mg Oral Daily  . tiZANidine  4 mg Oral TID  . traMADol  50 mg Oral TID   Continuous Infusions: . sodium chloride 75 mL/hr at 09/24/17 2327  . piperacillin-tazobactam (ZOSYN)  IV 3.375 g (09/25/17 0544)  . vancomycin Stopped (09/25/17 0543)     LOS: 1 day    Kerolos Nehme Jaynie Collins, MD [facility] Pager [phone]  If 7PM-7AM, please contact night-coverage [email or web address] Password [facility] 09/25/2017, 10:34 AM

## 2017-09-25 NOTE — Consult Note (Signed)
Orthopedic Consultation   Kelli Rivera  ZOX:[id number]  DOB: 03/14/1951  DOA: 09/24/2017  PCP: Angelica ChessmanAguiar, Rafaela M, MD   Requesting physician: Ronalee BeltsBhandari  Reason for consultation: Broken left small toe   History of Present Illness: Kelli Rivera is an 66 y.o. female who presents for COPD and rule out flu who injured her left small toe when she struck the toe on an object at home.  [facility] was [facility] where XRAYs revealed a nondisplaced proximal phalanx fracture of the small toe on the left foot.  She was instructed on buddy taping and given a post op shoe. Patient denies otherinjuries.     Past Medical History: Past Medical History:  Diagnosis Date  . Asthma   . Collagen vascular disease (HCC)   . COPD (chronic obstructive pulmonary disease) (HCC)    O2 increased to 3L while ambulating, 2L at rest as of last week  . Diabetes mellitus without complication (HCC)   . High cholesterol   . History of leukocytosis    negative bone marrow, followed by heme q215mo  . Morbid obesity (HCC)     Past Surgical History: Past Surgical History:  Procedure Laterality Date  . ABDOMINAL HYSTERECTOMY    . APPENDECTOMY     age 66 yrs  . bladder tuck    . CHOLECYSTECTOMY       Allergies:   Allergies  Allergen Reactions  . Liraglutide Nausea And Vomiting  . Pioglitazone Swelling  . Amoxicillin-Pot Clavulanate Nausea And Vomiting     Social History:  reports that she quit smoking about 11 years ago. She has a 42.00 pack-year smoking history. she has never used smokeless tobacco. She reports that she does not drink alcohol or use drugs.   Family History: Family History  Problem Relation Age of Onset  . Heart failure Mother [id number]  . Lung cancer Father [id number]      Physical Exam: Vitals:   09/25/17 0400 09/25/17 0500 09/25/17 0600 09/25/17 0800  BP: (!) 112/59 (!) 126/58 (!) 119/52 130/61  Pulse: 92 92 88 99  Resp: 20 (!) 22 19 (!) 23  Temp:  99.5 F (37.5 C)   (!) 101 F (38.3 C)  TempSrc: Axillary   Oral  SpO2:    97%  Weight:      Height:        Physical Exam: (focused) Left foot: foot and ankle not swollen and normal AROM and NVI. Left small toe is sensate and buddy taped to the 4th toe.  Normal cap refill.   Data reviewed:  I have personally reviewed following labs and imaging studies Labs:  CBC: Recent Labs  Lab 09/24/17 1152 09/25/17 0806  WBC 23.1* 21.2*  HGB 11.9* 11.4*  HCT 38.9 36.7  MCV 79.9 81.9  PLT 220 179    Basic Metabolic Panel: Recent Labs  Lab 09/24/17 1152  NA 139  K 3.7  CL 107  CO2 27  GLUCOSE 136*  BUN 8  CREATININE 0.83  CALCIUM 7.9*   GFR Estimated Creatinine Clearance: 86.5 mL/min (by C-G formula based on SCr of 0.83 mg/dL). Liver Function Tests: Recent Labs  Lab 09/24/17 1152  AST 10*  ALT 10*  ALKPHOS 82  BILITOT 0.4  PROT 5.5*  ALBUMIN 2.5*   Recent Labs  Lab 09/24/17 1152  LIPASE 22   No results for input(s): AMMONIA in the last 168  hours. Coagulation profile No results for input(s): INR, PROTIME in the last 168 hours.  Cardiac Enzymes: No results for input(s): CKTOTAL, CKMB, CKMBINDEX, TROPONINI in the last 168 hours. BNP: Invalid input(s): POCBNP CBG: Recent Labs  Lab 09/24/17 2214 09/25/17 0809  GLUCAP 138* 106*   D-Dimer No results for input(s): DDIMER in the last 72 hours. Hgb A1c No results for input(s): HGBA1C in the last 72 hours. Lipid Profile No results for input(s): CHOL, HDL, LDLCALC, TRIG, CHOLHDL, LDLDIRECT in the last 72 hours. Thyroid function studies No results for input(s): TSH, T4TOTAL, T3FREE, THYROIDAB in the last 72 hours.  Invalid input(s): FREET3 Anemia work up No results for input(s): VITAMINB12, FOLATE, FERRITIN, TIBC, IRON, RETICCTPCT in the last 72 hours. Urinalysis    Component Value Date/Time   COLORURINE YELLOW 09/24/2017 1412   APPEARANCEUR CLEAR 09/24/2017 1412   LABSPEC >1.030 (H) 09/24/2017 1412    PHURINE 5.5 09/24/2017 1412   GLUCOSEU NEGATIVE 09/24/2017 1412   HGBUR NEGATIVE 09/24/2017 1412   BILIRUBINUR SMALL (A) 09/24/2017 1412   KETONESUR NEGATIVE 09/24/2017 1412   PROTEINUR NEGATIVE 09/24/2017 1412   NITRITE NEGATIVE 09/24/2017 1412   LEUKOCYTESUR NEGATIVE 09/24/2017 1412     Sepsis Labs Invalid input(s): PROCALCITONIN,  WBC,  LACTICIDVEN Microbiology Recent Results (from the past 240 hour(s))  MRSA PCR Screening     Status: Abnormal   Collection Time: 09/24/17  9:37 PM  Result Value Ref Range Status   MRSA by PCR POSITIVE (A) NEGATIVE Final    Comment:        The GeneXpert MRSA Assay (FDA approved for NASAL specimens only), is one component of a comprehensive MRSA colonization surveillance program. It is not intended to diagnose MRSA infection nor to guide or monitor treatment for MRSA infections. RESULT CALLED TO, READ BACK BY AND VERIFIED WITH: R MCINTOSH AT 0333 ON 12.02.2018 BY NBROOKS        Inpatient Medications:   Scheduled Meds: . aspirin EC  81 mg Oral Daily  . Chlorhexidine Gluconate Cloth  6 each Topical Q0600  . dextromethorphan-guaiFENesin  1 tablet Oral BID  . enoxaparin (LOVENOX) injection  40 mg Subcutaneous Q24H  . fluticasone  2 spray Each Nare Daily  . insulin aspart  0-9 Units Subcutaneous TID WC  . insulin glargine  50 Units Subcutaneous QHS  . ipratropium-albuterol  3 mL Nebulization TID  . methylPREDNISolone (SOLU-MEDROL) injection  60 mg Intravenous TID  . mirabegron ER  50 mg Oral Daily  . montelukast  10 mg Oral QHS  . multivitamin with minerals   Oral Daily  . mupirocin ointment  1 application Nasal BID  . mupirocin ointment      . nicotine  21 mg Transdermal Daily  . pantoprazole  40 mg Oral Daily  . tiZANidine  4 mg Oral TID  . traMADol  50 mg Oral TID   Continuous Infusions: . sodium chloride 75 mL/hr at 09/24/17 2327  . piperacillin-tazobactam (ZOSYN)  IV 3.375 g (09/25/17 0544)  . vancomycin Stopped (09/25/17  0543)     Radiological Exams on Admission: Dg Chest 2 View  Result Date: 09/24/2017 CLINICAL DATA:  Cough, congestion EXAM: CHEST  2 VIEW COMPARISON:  09/22/2016 FINDINGS: Heart is borderline in size. Lingular scarring. Mild vascular congestion. No overt edema or acute opacities. No effusions or acute bony abnormality. IMPRESSION: Borderline heart size.  Mild vascular congestion. Lingular scarring. Electronically Signed   By: Charlett Nose M.D.   On: 09/24/2017 12:37   Ct  Chest W Contrast  Result Date: 09/24/2017 CLINICAL DATA:  Diabetic patient with generalized weakness. Nausea and vomiting. EXAM: CT CHEST, ABDOMEN, AND PELVIS WITH CONTRAST TECHNIQUE: Multidetector CT imaging of the chest, abdomen and pelvis was performed following the standard protocol during bolus administration of intravenous contrast. CONTRAST:  ISOVUE-300 IOPAMIDOL (ISOVUE-300) INJECTION 61% COMPARISON:  Abdomen CT April 26, 2015 FINDINGS: CT CHEST FINDINGS Cardiovascular: The heart size is borderline. No obvious coronary artery calcifications. The main pulmonary artery measures 3.6 cm. No filling defects in the pulmonary arteries on limited images. The thoracic aorta is normal in caliber. Taking motion artifact into account, no dissection. There is mild atherosclerosis in the arch. A superior aortic recess is identified with fluid adjacent to the aorta, extending inferiorly. The underlying aorta is normal in appearance. Mediastinum/Nodes: Tiny nodules in the thyroid of doubtful significance. The esophagus is normal in appearance. No adenopathy. No effusions. Lungs/Pleura: Central airways are normal. No pneumothorax. Scattered atelectasis. No pulmonary nodules, masses, or focal infiltrates. Musculoskeletal: See below. CT ABDOMEN PELVIS FINDINGS Hepatobiliary: The patient is status post hepatic steatosis. No biliary duct dilatation. Limited views of the pulmonary arteries are normal with opacification. No liver masses are seen.  Pancreas: Unremarkable. No pancreatic ductal dilatation or surrounding inflammatory changes. Spleen: Normal in size without focal abnormality. Adrenals/Urinary Tract: There is a probable cyst in the lower left kidney, too small to characterize. No hydronephrosis, suspicious mass, or stone. No ureterectasis or ureteral stone. The bladder is normal. Stomach/Bowel: The stomach and small bowel are normal. A few scattered colonic diverticuli are identified without diverticulitis. The appendix is not seen but there is no evidence of appendicitis. By report, the patient is status post appendectomy. Vascular/Lymphatic: The abdominal aorta is normal in caliber with no dissection. No adenopathy. Reproductive: Status post hysterectomy. No adnexal masses. Other: No abdominal wall hernia or abnormality. No abdominopelvic ascites. Musculoskeletal: No acute or significant osseous findings. IMPRESSION: 1. No acute abnormalities identified to explain the patient's symptoms. 2. The main pulmonary artery measures 3.6 cm raising the possibility of pulmonary arterial hypertension. 3. 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 Aortic Atherosclerosis (ICD10-I70.0). Electronically Signed   By: Gerome Sam III M.D   On: 09/24/2017 15:51   Ct Abdomen Pelvis W Contrast  Result Date: 09/24/2017 CLINICAL DATA:  Diabetic patient with generalized weakness. Nausea and vomiting. EXAM: CT CHEST, ABDOMEN, AND PELVIS WITH CONTRAST TECHNIQUE: Multidetector CT imaging of the chest, abdomen and pelvis was performed following the standard protocol during bolus administration of intravenous contrast. CONTRAST:  ISOVUE-300 IOPAMIDOL (ISOVUE-300) INJECTION 61% COMPARISON:  Abdomen CT April 26, 2015 FINDINGS: CT CHEST FINDINGS Cardiovascular: The heart size is borderline. No obvious coronary artery calcifications. The main pulmonary artery measures 3.6 cm. No filling defects in the pulmonary arteries on limited images. The thoracic aorta is normal in  caliber. Taking motion artifact into account, no dissection. There is mild atherosclerosis in the arch. A superior aortic recess is identified with fluid adjacent to the aorta, extending inferiorly. The underlying aorta is normal in appearance. Mediastinum/Nodes: Tiny nodules in the thyroid of doubtful significance. The esophagus is normal in appearance. No adenopathy. No effusions. Lungs/Pleura: Central airways are normal. No pneumothorax. Scattered atelectasis. No pulmonary nodules, masses, or focal infiltrates. Musculoskeletal: See below. CT ABDOMEN PELVIS FINDINGS Hepatobiliary: The patient is status post hepatic steatosis. No biliary duct dilatation. Limited views of the pulmonary arteries are normal with opacification. No liver masses are seen. Pancreas: Unremarkable. No pancreatic ductal dilatation or surrounding inflammatory changes.  Spleen: Normal in size without focal abnormality. Adrenals/Urinary Tract: There is a probable cyst in the lower left kidney, too small to characterize. No hydronephrosis, suspicious mass, or stone. No ureterectasis or ureteral stone. The bladder is normal. Stomach/Bowel: The stomach and small bowel are normal. A few scattered colonic diverticuli are identified without diverticulitis. The appendix is not seen but there is no evidence of appendicitis. By report, the patient is status post appendectomy. Vascular/Lymphatic: The abdominal aorta is normal in caliber with no dissection. No adenopathy. Reproductive: Status post hysterectomy. No adnexal masses. Other: No abdominal wall hernia or abnormality. No abdominopelvic ascites. Musculoskeletal: No acute or significant osseous findings. IMPRESSION: 1. No acute abnormalities identified to explain the patient's symptoms. 2. The main pulmonary artery measures 3.6 cm raising the possibility of pulmonary arterial hypertension. 3. 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 Aortic Atherosclerosis (ICD10-I70.0). Electronically Signed   By: Gerome Sam III M.D   On: 09/24/2017 15:51   Dg Toe 5th Left  Result Date: 09/24/2017 CLINICAL DATA:  Hit left fifth toe yesterday.  Pain.  Bruising EXAM: DG TOE 5TH LEFT COMPARISON:  None. FINDINGS: Lucency at the base of the proximal phalanx of the left little toe compatible with fracture. No subluxation or dislocation. IMPRESSION: Minimally displaced fracture at the base of the proximal phalanx left little toe. Electronically Signed   By: Charlett Nose M.D.   On: 09/24/2017 12:54    Impression/Recommendations Principal Problem:   COPD with acute exacerbation (HCC) Active Problems:   Generalized weakness   Hyperlipidemia   Diabetes mellitus type 2 in obese (HCC)   Sepsis (HCC)   Diarrhea   Toe fracture, left   GERD (gastroesophageal reflux disease)   Iron deficiency   Tobacco abuse   Abdominal pain  ORTHO problem: left small toe nondisplaced proximal phalanx fracture. Recommend closed treatment with buddy taping as she is doing and use of the post op shoe for ambulation.  I discussed with the patient and her family that this fracture will reliably heal in 6 weeks.  Recommend bearing weight through the heel while up on the left foot. After 6 weeks, can d/c the buddy taping and resume normal shoe wear. Follow up as an outpatient if needed.  Thanks    Thank you for this consultation.     Verlee Rossetti M.D. [facility] 09/25/2017, 11:14 AM

## 2017-09-25 NOTE — Plan of Care (Signed)
  Progressing Education: Knowledge of General Education information will improve 09/25/2017 1344 - Progressing by Charlett Lango, RN Health Behavior/Discharge Planning: Ability to manage health-related needs will improve 09/25/2017 1344 - Progressing by Charlett Lango, RN Clinical Measurements: Ability to maintain clinical measurements within normal limits will improve 09/25/2017 1344 - Progressing by Charlett Lango, RN Will remain free from infection 09/25/2017 1344 - Progressing by Charlett Lango, RN Respiratory complications will improve 09/25/2017 1344 - Progressing by Charlett Lango, RN Safety: Ability to remain free from injury will improve 09/25/2017 1344 - Progressing by Charlett Lango, RN

## 2017-09-26 ENCOUNTER — Encounter (HOSPITAL_COMMUNITY): Payer: Self-pay | Admitting: *Deleted

## 2017-09-26 DIAGNOSIS — R1010 Upper abdominal pain, unspecified: Secondary | ICD-10-CM

## 2017-09-26 LAB — GLUCOSE, CAPILLARY
GLUCOSE-CAPILLARY: 217 mg/dL — AB (ref 65–99)
GLUCOSE-CAPILLARY: 226 mg/dL — AB (ref 65–99)
GLUCOSE-CAPILLARY: 191 mg/dL — AB (ref 65–99)
GLUCOSE-CAPILLARY: 209 mg/dL — AB (ref 65–99)

## 2017-09-26 LAB — CREATININE, SERUM
Creatinine, Ser: 0.71 mg/dL (ref 0.44–1.00)
GFR calc non Af Amer: >60 mL/min (ref >60)

## 2017-09-26 MED ORDER — LEVOFLOXACIN 500 MG PO TABS: 500.0000 mg | ORAL_TABLET | Freq: Every day | ORAL | Status: DC

## 2017-09-26 MED ORDER — PREDNISONE 50 MG PO TABS
50.0000 mg | ORAL_TABLET | Freq: Every day | ORAL | Status: DC
  Administered 2017-09-27: 50 mg via ORAL
  Filled 2017-09-26: qty 2

## 2017-09-26 MED ORDER — AMOXICILLIN-POT CLAVULANATE 875-125 MG PO TABS
1.0000 | ORAL_TABLET | Freq: Two times a day (BID) | ORAL | Status: DC
  Administered 2017-09-26 – 2017-09-28 (×5): 1 via ORAL
  Filled 2017-09-26 (×5): qty 1

## 2017-09-26 MED ORDER — FUROSEMIDE 10 MG/ML IJ SOLN
40.0000 mg | Freq: Once | INTRAMUSCULAR | Status: AC
  Administered 2017-09-26: 40 mg via INTRAVENOUS
  Filled 2017-09-26: qty 4

## 2017-09-26 NOTE — Progress Notes (Signed)
Dr. Susie Cassette notifed of heartrate 43-47 with patient sleeping.

## 2017-09-26 NOTE — Progress Notes (Signed)
PROGRESS NOTE    Kelli Rivera  MWU:[id number] DOB: 06/05/1951 DOA: 09/24/2017 PCP: Angelica ChessmanAguiar, Rafaela M, MD   Brief Narrative: 66 year old female with history of diabetes, hypertension, COPD with chronic hypoxia on 2 L of oxygen at home, asthma, GERD, obesity, iron deficiency anemia presented with shortness of breath, cough productive, fever chills and left fifth toe injury causing fracture. Patient is improving transferred out of ICU on 12/3  Assessment & Plan:   #COPD with acute exacerbation and sepsis: Patient with fever, productive cough, shortness of breath and wheezing, leukocytosis. -Plan to continue bronchodilators, antibiotics, Solu-Medrol IV. Start tapering steroids.  Blood culture no growth so far, respiratory panel negative.    Lactate level trended down Transfer to telemetry Taper steroids De-escalate antibiotics, discontinue Zosyn, vancomycin, change to oral levofloxacin Leukocytosis secondary to steroids  #Respiratory failure with chronic hypoxia on 2 L at baseline: Continue 2 L of oxygen.  #Hyperlipidemia: Continue Lipitor.  #Type 2 diabetes in obese patient: Continue current insulin , hyperglycemic secondary to steroids, taper steroids  #Injury and left fifth toe minimally displaced fracture: Continue supportive care.  Orthopedics consult requested and discussed with Dr. Ranell PatrickNorris.  #Diarrhea and abdominal pain: CT scan of abdomen pelvis negative.  Lipase normal.  Likely viral etiology.  Continue supportive care.  Denies any nausea vomiting  #Tobacco abuse: Continue nicotine patch.  Patient was counseled.  # resting bradycardia-likely secondary to underlying sleep apnea, patient has an upcoming sleep study    DVT prophylaxis: Lovenox subcutaneous Code Status: Full code Family Communication: Transfer to telemetry, discussed with patient's daughter     Consultants:   Orthopedics  Procedures: None Antimicrobials: Vanco and Zosyn. Started levofloxacin  12/3  Subjective: Denies any nausea vomiting diarrhea, chest pain shortness of breath have improved  Objective: Vitals:   09/26/17 0400 09/26/17 0500 09/26/17 0600 09/26/17 0800  BP: (!) 117/55 112/62 (!) 107/54 (!) 117/54  Pulse: 68 (!) 55 (!) 53 72  Resp: 17 16 16 18   Temp:    97.8 F (36.6 C)  TempSrc:    Oral  SpO2: 93% 97% 94% 96%  Weight:      Height:        Intake/Output Summary (Last 24 hours) at 09/26/2017 0935 Last data filed at 09/26/2017 0900 Gross per 24 hour  Intake 3840 ml  Output 1120 ml  Net 2720 ml   Filed Weights   09/24/17 2200  Weight: 126.1 kg (278 lb)    Examination:  General exam: Looks ill with mild increased work of breathing. Respiratory system: Minimal diffuse expiratory wheeze with occasional crackle.  cardiovascular system: S1 & S2 heard, RRR.  No pedal edema. Gastrointestinal system: Abdomen is nondistended, soft and nontender. Normal bowel sounds heard. Central nervous system: Alert and oriented. No focal neurological deficits. Extremities: Symmetric 5 x 5 power. Skin: No rashes, lesions or ulcers Psychiatry: Judgement and insight appear normal. Mood & affect appropriate.     Data Reviewed: I have personally reviewed following labs and imaging studies  CBC: Recent Labs  Lab 09/24/17 1152 09/25/17 0806  WBC 23.1* 21.2*  HGB 11.9* 11.4*  HCT 38.9 36.7  MCV 79.9 81.9  PLT 220 179   Basic Metabolic Panel: Recent Labs  Lab 09/24/17 1152 09/25/17 1452 09/26/17 0340  NA 139  --   --   K 3.7  --   --   CL 107  --   --   CO2 27  --   --   GLUCOSE 136*  --   --  BUN 8  --   --   CREATININE 0.83 0.76 0.71  CALCIUM 7.9*  --   --    GFR: Estimated Creatinine Clearance: 89.7 mL/min (by C-G formula based on SCr of 0.71 mg/dL). Liver Function Tests: Recent Labs  Lab 09/24/17 1152  AST 10*  ALT 10*  ALKPHOS 82  BILITOT 0.4  PROT 5.5*  ALBUMIN 2.5*   Recent Labs  Lab 09/24/17 1152  LIPASE 22   No results for  input(s): AMMONIA in the last 168 hours. Coagulation Profile: No results for input(s): INR, PROTIME in the last 168 hours. Cardiac Enzymes: No results for input(s): CKTOTAL, CKMB, CKMBINDEX, TROPONINI in the last 168 hours. BNP (last 3 results) No results for input(s): PROBNP in the last 8760 hours. HbA1C: No results for input(s): HGBA1C in the last 72 hours. CBG: Recent Labs  Lab 09/25/17 1152 09/25/17 1308 09/25/17 1647 09/25/17 2123 09/26/17 0838  GLUCAP 152* 164* 198* 228* 217*   Lipid Profile: No results for input(s): CHOL, HDL, LDLCALC, TRIG, CHOLHDL, LDLDIRECT in the last 72 hours. Thyroid Function Tests: No results for input(s): TSH, T4TOTAL, FREET4, T3FREE, THYROIDAB in the last 72 hours. Anemia Panel: No results for input(s): VITAMINB12, FOLATE, FERRITIN, TIBC, IRON, RETICCTPCT in the last 72 hours. Sepsis Labs: Recent Labs  Lab 09/24/17 1405 09/25/17 0322 09/25/17 0806  PROCALCITON  --  0.30  --   LATICACIDVEN 1.18 0.7 0.8    Recent Results (from the past 240 hour(s))  Blood Culture (routine x 2)     Status: None (Preliminary result)   Collection Time: 09/24/17  2:35 PM  Result Value Ref Range Status   Specimen Description BLOOD LEFT FOREARM  Final   Special Requests   Final    BOTTLES DRAWN AEROBIC AND ANAEROBIC Blood Culture results may not be optimal due to an excessive volume of blood received in culture bottles   Culture   Final    NO GROWTH 2 DAYS Performed at [facility], [address], [place], [place] [address]    Report Status PENDING  Incomplete  Blood Culture (routine x 2)     Status: None (Preliminary result)   Collection Time: 09/24/17  2:35 PM  Result Value Ref Range Status   Specimen Description BLOOD RIGHT HAND  Final   Special Requests   Final    BOTTLES DRAWN AEROBIC AND ANAEROBIC Blood Culture results may not be optimal due to an inadequate volume of blood received in culture bottles   Culture   Final    NO GROWTH 2  DAYS Performed at [facility], [address] [place], [place] [address]    Report Status PENDING  Incomplete  MRSA PCR Screening     Status: Abnormal   Collection Time: 09/24/17  9:37 PM  Result Value Ref Range Status   MRSA by PCR POSITIVE (A) NEGATIVE Final    Comment:        The GeneXpert MRSA Assay (FDA approved for NASAL specimens only), is one component of a comprehensive MRSA colonization surveillance program. It is not intended to diagnose MRSA infection nor to guide or monitor treatment for MRSA infections. RESULT CALLED TO, READ BACK BY AND VERIFIED WITH: R MCINTOSH AT 0333 ON 12.02.2018 BY NBROOKS   Respiratory Panel by PCR     Status: None   Collection Time: 09/25/17  2:40 AM  Result Value Ref Range Status   Adenovirus NOT DETECTED NOT DETECTED Final   Coronavirus 229E NOT  DETECTED NOT DETECTED Final   Coronavirus HKU1 NOT DETECTED NOT DETECTED Final   Coronavirus NL63 NOT DETECTED NOT DETECTED Final   Coronavirus OC43 NOT DETECTED NOT DETECTED Final   Metapneumovirus NOT DETECTED NOT DETECTED Final   Rhinovirus / Enterovirus NOT DETECTED NOT DETECTED Final   Influenza A NOT DETECTED NOT DETECTED Final   Influenza B NOT DETECTED NOT DETECTED Final   Parainfluenza Virus 1 NOT DETECTED NOT DETECTED Final   Parainfluenza Virus 2 NOT DETECTED NOT DETECTED Final   Parainfluenza Virus 3 NOT DETECTED NOT DETECTED Final   Parainfluenza Virus 4 NOT DETECTED NOT DETECTED Final   Respiratory Syncytial Virus NOT DETECTED NOT DETECTED Final   Bordetella pertussis NOT DETECTED NOT DETECTED Final   Chlamydophila pneumoniae NOT DETECTED NOT DETECTED Final   Mycoplasma pneumoniae NOT DETECTED NOT DETECTED Final    Comment: Performed at [facility], [address] [place], [place] [address]         Radiology Studies: Dg Chest 2 View  Result Date: 09/24/2017 CLINICAL DATA:  Cough, congestion EXAM: CHEST  2 VIEW COMPARISON:  09/22/2016 FINDINGS: Heart is  borderline in size. Lingular scarring. Mild vascular congestion. No overt edema or acute opacities. No effusions or acute bony abnormality. IMPRESSION: Borderline heart size.  Mild vascular congestion. Lingular scarring. Electronically Signed   By: Charlett Nose M.D.   On: 09/24/2017 12:37   Ct Chest W Contrast  Result Date: 09/24/2017 CLINICAL DATA:  Diabetic patient with generalized weakness. Nausea and vomiting. EXAM: CT CHEST, ABDOMEN, AND PELVIS WITH CONTRAST TECHNIQUE: Multidetector CT imaging of the chest, abdomen and pelvis was performed following the standard protocol during bolus administration of intravenous contrast. CONTRAST:  ISOVUE-300 IOPAMIDOL (ISOVUE-300) INJECTION 61% COMPARISON:  Abdomen CT April 26, 2015 FINDINGS: CT CHEST FINDINGS Cardiovascular: The heart size is borderline. No obvious coronary artery calcifications. The main pulmonary artery measures 3.6 cm. No filling defects in the pulmonary arteries on limited images. The thoracic aorta is normal in caliber. Taking motion artifact into account, no dissection. There is mild atherosclerosis in the arch. A superior aortic recess is identified with fluid adjacent to the aorta, extending inferiorly. The underlying aorta is normal in appearance. Mediastinum/Nodes: Tiny nodules in the thyroid of doubtful significance. The esophagus is normal in appearance. No adenopathy. No effusions. Lungs/Pleura: Central airways are normal. No pneumothorax. Scattered atelectasis. No pulmonary nodules, masses, or focal infiltrates. Musculoskeletal: See below. CT ABDOMEN PELVIS FINDINGS Hepatobiliary: The patient is status post hepatic steatosis. No biliary duct dilatation. Limited views of the pulmonary arteries are normal with opacification. No liver masses are seen. Pancreas: Unremarkable. No pancreatic ductal dilatation or surrounding inflammatory changes. Spleen: Normal in size without focal abnormality. Adrenals/Urinary Tract: There is a probable  cyst in the lower left kidney, too small to characterize. No hydronephrosis, suspicious mass, or stone. No ureterectasis or ureteral stone. The bladder is normal. Stomach/Bowel: The stomach and small bowel are normal. A few scattered colonic diverticuli are identified without diverticulitis. The appendix is not seen but there is no evidence of appendicitis. By report, the patient is status post appendectomy. Vascular/Lymphatic: The abdominal aorta is normal in caliber with no dissection. No adenopathy. Reproductive: Status post hysterectomy. No adnexal masses. Other: No abdominal wall hernia or abnormality. No abdominopelvic ascites. Musculoskeletal: No acute or significant osseous findings. IMPRESSION: 1. No acute abnormalities identified to explain the patient's symptoms. 2. The main pulmonary artery measures 3.6 cm raising the possibility of pulmonary arterial hypertension. 3. Atherosclerosis  in the aortic arch. Aortic Atherosclerosis (ICD10-I70.0). Electronically Signed   By: Gerome Sam III M.D   On: 09/24/2017 15:51   Ct Abdomen Pelvis W Contrast  Result Date: 09/24/2017 CLINICAL DATA:  Diabetic patient with generalized weakness. Nausea and vomiting. EXAM: CT CHEST, ABDOMEN, AND PELVIS WITH CONTRAST TECHNIQUE: Multidetector CT imaging of the chest, abdomen and pelvis was performed following the standard protocol during bolus administration of intravenous contrast. CONTRAST:  ISOVUE-300 IOPAMIDOL (ISOVUE-300) INJECTION 61% COMPARISON:  Abdomen CT April 26, 2015 FINDINGS: CT CHEST FINDINGS Cardiovascular: The heart size is borderline. No obvious coronary artery calcifications. The main pulmonary artery measures 3.6 cm. No filling defects in the pulmonary arteries on limited images. The thoracic aorta is normal in caliber. Taking motion artifact into account, no dissection. There is mild atherosclerosis in the arch. A superior aortic recess is identified with fluid adjacent to the aorta, extending  inferiorly. The underlying aorta is normal in appearance. Mediastinum/Nodes: Tiny nodules in the thyroid of doubtful significance. The esophagus is normal in appearance. No adenopathy. No effusions. Lungs/Pleura: Central airways are normal. No pneumothorax. Scattered atelectasis. No pulmonary nodules, masses, or focal infiltrates. Musculoskeletal: See below. CT ABDOMEN PELVIS FINDINGS Hepatobiliary: The patient is status post hepatic steatosis. No biliary duct dilatation. Limited views of the pulmonary arteries are normal with opacification. No liver masses are seen. Pancreas: Unremarkable. No pancreatic ductal dilatation or surrounding inflammatory changes. Spleen: Normal in size without focal abnormality. Adrenals/Urinary Tract: There is a probable cyst in the lower left kidney, too small to characterize. No hydronephrosis, suspicious mass, or stone. No ureterectasis or ureteral stone. The bladder is normal. Stomach/Bowel: The stomach and small bowel are normal. A few scattered colonic diverticuli are identified without diverticulitis. The appendix is not seen but there is no evidence of appendicitis. By report, the patient is status post appendectomy. Vascular/Lymphatic: The abdominal aorta is normal in caliber with no dissection. No adenopathy. Reproductive: Status post hysterectomy. No adnexal masses. Other: No abdominal wall hernia or abnormality. No abdominopelvic ascites. Musculoskeletal: No acute or significant osseous findings. IMPRESSION: 1. No acute abnormalities identified to explain the patient's symptoms. 2. The main pulmonary artery measures 3.6 cm raising the possibility of pulmonary arterial hypertension. 3. 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 Aortic Atherosclerosis (ICD10-I70.0). Electronically Signed   By: Gerome Sam III M.D   On: 09/24/2017 15:51   Dg Toe 5th Left  Result Date: 09/24/2017 CLINICAL DATA:  Hit left fifth toe yesterday.  Pain.  Bruising EXAM: DG TOE 5TH LEFT COMPARISON:   None. FINDINGS: Lucency at the base of the proximal phalanx of the left little toe compatible with fracture. No subluxation or dislocation. IMPRESSION: Minimally displaced fracture at the base of the proximal phalanx left little toe. Electronically Signed   By: Charlett Nose M.D.   On: 09/24/2017 12:54        Scheduled Meds: . aspirin EC  81 mg Oral Daily  . Chlorhexidine Gluconate Cloth  6 each Topical Q0600  . dextromethorphan-guaiFENesin  1 tablet Oral BID  . enoxaparin (LOVENOX) injection  40 mg Subcutaneous Q24H  . fluticasone  2 spray Each Nare Daily  . insulin aspart  0-9 Units Subcutaneous TID WC  . insulin glargine  50 Units Subcutaneous QHS  . ipratropium-albuterol  3 mL Nebulization TID  . methylPREDNISolone (SOLU-MEDROL) injection  60 mg Intravenous TID  . mirabegron ER  50 mg Oral Daily  . montelukast  10 mg Oral QHS  . multivitamin with minerals  Oral Daily  . mupirocin ointment  1 application Nasal BID  . nicotine  21 mg Transdermal Daily  . pantoprazole  40 mg Oral Daily  . tiZANidine  4 mg Oral TID  . traMADol  50 mg Oral TID   Continuous Infusions: . sodium chloride 75 mL/hr at 09/25/17 2232  . piperacillin-tazobactam (ZOSYN)  IV Stopped (09/26/17 0931)  . vancomycin 750 mg (09/26/17 0524)     LOS: 2 days    Richarda Overlie, MD [facility] Pager [phone]  If 7PM-7AM, please contact night-coverage [email or web address] Password [email or web address] 09/26/2017, 9:35 AM

## 2017-09-26 NOTE — Care Management Note (Signed)
Case Management Note  Patient Details  Name: Kelli Rivera MRN: [id number] Date of Birth: 25-May-1951  Subjective/Objective:                  Sepsis   Action/Plan: Date: September 26, 2017 Marcelle Smiling, BSN, [place], [place] [phone] Chart and notes review for patient progress and needs. Will follow for case management and discharge needs. Next review date: [id number]  Expected Discharge Date:                  Expected Discharge Plan:  Home/Self Care  In-House Referral:     Discharge planning Services  CM Consult  Post Acute Care Choice:    Choice offered to:     DME Arranged:    DME Agency:     HH Arranged:    HH Agency:     Status of Service:  In process, will continue to follow  If discussed at Long Length of Stay Meetings, dates discussed:    Additional Comments:  Golda Acre, RN 09/26/2017, 9:36 AM

## 2017-09-26 NOTE — Evaluation (Addendum)
Occupational Therapy Evaluation Patient Details Name: Kelli Rivera MRN: [id number] DOB: 1951-01-26 Today's Date: 09/26/2017    History of Present Illness 66 y.o. female admitted with COPD exacerbation and sepsis.  PMH includes:  Recent Lt fifth toe fracture; DM    Clinical Impression   Pt admitted with above. She demonstrates the below listed deficits and will benefit from continued OT to maximize safety and independence with BADLs.  Pt presents to OT with generalized weakness, impaired balance and decreased activity tolerance.  She requires min guard to mod A for ADLs, and min A +2 for functional mobility.  She fatigues quickly with activity - 02 sats >93% on 3L 02, DOe 3/4.  Pt lives alone and was fully independent PTA.  Family is available to assist at discharge as needed.       Follow Up Recommendations  Home health OT;Supervision/Assistance - 24 hour    Equipment Recommendations  None recommended by OT    Recommendations for Other Services       Precautions / Restrictions Precautions Precautions: Fall Restrictions:  Post op shoe Lt foot  Weight Bearing Restrictions: Yes      Mobility Bed Mobility Overal bed mobility: Needs Assistance Bed Mobility: Supine to Sit;Sit to Supine     Supine to sit: Min assist Sit to supine: Mod assist   General bed mobility comments: assist moving LEs onto and off of bed   Transfers Overall transfer level: Needs assistance Equipment used: 1 person hand held assist Transfers: Sit to/from Stand;Stand Pivot Transfers Sit to Stand: Min assist Stand pivot transfers: Min assist       General transfer comment: assist to steady     Balance                                           ADL either performed or assessed with clinical judgement   ADL Overall ADL's : Needs assistance/impaired Eating/Feeding: Independent   Grooming: Wash/dry hands;Wash/dry face;Oral care;Set up;Sitting   Upper Body Bathing:  Sitting;Minimal assistance   Lower Body Bathing: Sit to/from stand;Moderate assistance   Upper Body Dressing : Minimal assistance;Sitting   Lower Body Dressing: Maximal assistance;Sit to/from stand   Toilet Transfer: Minimal assistance;Ambulation;Comfort height toilet   Toileting- Clothing Manipulation and Hygiene: Moderate assistance;Sit to/from stand       Functional mobility during ADLs: Minimal assistance;+2 for safety/equipment General ADL Comments: Pt fatigues quickly with activity      Vision         Perception     Praxis      Pertinent Vitals/Pain Pain Assessment: No/denies pain     Hand Dominance Right   Extremity/Trunk Assessment Upper Extremity Assessment Upper Extremity Assessment: Generalized weakness   Lower Extremity Assessment Lower Extremity Assessment: Defer to PT evaluation   Cervical / Trunk Assessment Cervical / Trunk Assessment: Normal   Communication Communication Communication: No difficulties   Cognition Arousal/Alertness: Awake/alert Behavior During Therapy: WFL for tasks assessed/performed Overall Cognitive Status: Within Functional Limits for tasks assessed                                     General Comments  02 sats remained > 93% on 3L supplemental 02    Exercises     Shoulder Instructions      Home Living Family/patient  expects to be discharged to:: Private residence Living Arrangements: Alone Available Help at Discharge: Family;Available PRN/intermittently Type of Home: Apartment Home Access: Stairs to enter Entrance Stairs-Number of Steps: 1   Home Layout: One level     Bathroom Shower/Tub: Chief Strategy Officer: Standard     Home Equipment: Cane - single point;Walker - 2 wheels;Shower seat   Additional Comments: Pt is debating wether to go to her daughter's or stay at her apt with family assisting.  Dtr lives in 2 level home with bed and bath upstairs       Prior  Functioning/Environment Level of Independence: Independent        Comments: She reports daughter assists with grocery shopping as she has a difficult time carrying heavy items         OT Problem List: Decreased strength;Decreased activity tolerance;Impaired balance (sitting and/or standing);Decreased safety awareness;Decreased knowledge of use of DME or AE;Cardiopulmonary status limiting activity;Obesity      OT Treatment/Interventions: Self-care/ADL training;Energy conservation;DME and/or AE instruction;Therapeutic activities;Patient/family education;Balance training    OT Goals(Current goals can be found in the care plan section) Acute Rehab OT Goals Patient Stated Goal: to get stronger  OT Goal Formulation: With patient Time For Goal Achievement: 10/10/17 Potential to Achieve Goals: Good ADL Goals Pt Will Perform Grooming: with min guard assist Pt Will Perform Upper Body Bathing: with set-up;sitting Pt Will Perform Lower Body Bathing: with min assist;sit to/from stand Pt Will Perform Upper Body Dressing: with set-up;sitting Pt Will Perform Lower Body Dressing: with min assist;sit to/from stand Pt Will Transfer to Toilet: with min guard assist;ambulating;bedside commode;grab bars Pt Will Perform Toileting - Clothing Manipulation and hygiene: with min guard assist;sit to/from stand  OT Frequency: Min 2X/week   Barriers to D/C:            Co-evaluation              AM-PAC PT "6 Clicks" Daily Activity     Outcome Measure Help from another person eating meals?: None Help from another person taking care of personal grooming?: A Little Help from another person toileting, which includes using toliet, bedpan, or urinal?: A Lot Help from another person bathing (including washing, rinsing, drying)?: A Lot Help from another person to put on and taking off regular upper body clothing?: A Little Help from another person to put on and taking off regular lower body clothing?: A  Lot 6 Click Score: 16   End of Session Equipment Utilized During Treatment: Rolling walker Nurse Communication: Mobility status  Activity Tolerance: Patient limited by fatigue Patient left: in bed;with call bell/phone within reach;with nursing/sitter in room;with family/visitor present  OT Visit Diagnosis: Unsteadiness on feet (R26.81)                Time: 6962-9528 and 1520-1535 OT Time Calculation (min): 34 min and 15 mins Charges:  OT General Charges $OT Visit: 3 Visit OT Evaluation $OT Eval Moderate Complexity: 1 Mod OT Treatments $Self Care/Home Management : 8-22 mins $Therapeutic Activity: 23-37 mins G-Codes:     [employer], OTR/L [phone]   Jeani Hawking M 09/26/2017, 5:39 PM

## 2017-09-27 DIAGNOSIS — R1013 Epigastric pain: Secondary | ICD-10-CM

## 2017-09-27 DIAGNOSIS — J441 Chronic obstructive pulmonary disease with (acute) exacerbation: Secondary | ICD-10-CM

## 2017-09-27 DIAGNOSIS — R531 Weakness: Secondary | ICD-10-CM

## 2017-09-27 DIAGNOSIS — R001 Bradycardia, unspecified: Secondary | ICD-10-CM

## 2017-09-27 LAB — COMPREHENSIVE METABOLIC PANEL
ALT: 13 U/L — ABNORMAL LOW (ref 14–54)
ANION GAP: 6 (ref 5–15)
AST: 16 U/L (ref 15–41)
Albumin: 2.5 g/dL — ABNORMAL LOW (ref 3.5–5.0)
Alkaline Phosphatase: 80 U/L (ref 38–126)
BILIRUBIN TOTAL: 0.4 mg/dL (ref 0.3–1.2)
BUN: 16 mg/dL (ref 6–20)
CALCIUM: 8.5 mg/dL — AB (ref 8.9–10.3)
CO2: 28 mmol/L (ref 22–32)
Chloride: 105 mmol/L (ref 101–111)
Creatinine, Ser: 0.68 mg/dL (ref 0.44–1.00)
GFR calc Af Amer: >60 mL/min (ref >60)
Glucose, Bld: 202 mg/dL — ABNORMAL HIGH (ref 65–99)
Potassium: 4.3 mmol/L (ref 3.5–5.1)
Sodium: 139 mmol/L (ref 135–145)
TOTAL PROTEIN: 5.8 g/dL — AB (ref 6.5–8.1)

## 2017-09-27 LAB — EXPECTORATED SPUTUM ASSESSMENT W REFEX TO RESP CULTURE

## 2017-09-27 LAB — CBC
HEMATOCRIT: 37.4 % (ref 36.0–46.0)
Hemoglobin: 11.1 g/dL — ABNORMAL LOW (ref 12.0–15.0)
MCH: 24.1 pg — AB (ref 26.0–34.0)
MCHC: 29.7 g/dL — AB (ref 30.0–36.0)
MCV: 81.1 fL (ref 78.0–100.0)
Platelets: 204 10*3/uL (ref 150–400)
RBC: 4.61 MIL/uL (ref 3.87–5.11)
RDW: 18.2 % — AB (ref 11.5–15.5)
WBC: 24.7 10*3/uL — ABNORMAL HIGH (ref 4.0–10.5)

## 2017-09-27 LAB — EXPECTORATED SPUTUM ASSESSMENT W GRAM STAIN, RFLX TO RESP C

## 2017-09-27 LAB — TSH: TSH: 1.206 u[IU]/mL (ref 0.350–4.500)

## 2017-09-27 LAB — GLUCOSE, CAPILLARY
GLUCOSE-CAPILLARY: 119 mg/dL — AB (ref 65–99)
GLUCOSE-CAPILLARY: 193 mg/dL — AB (ref 65–99)

## 2017-09-27 LAB — T4, FREE: Free T4: 0.98 ng/dL (ref 0.61–1.12)

## 2017-09-27 MED ORDER — ENOXAPARIN SODIUM 60 MG/0.6ML ~~LOC~~ SOLN
60.0000 mg | SUBCUTANEOUS | Status: DC
  Administered 2017-09-28 – 2017-09-30 (×3): 60 mg via SUBCUTANEOUS
  Filled 2017-09-27 (×4): qty 0.6

## 2017-09-27 MED ORDER — FUROSEMIDE 10 MG/ML IJ SOLN
40.0000 mg | Freq: Once | INTRAMUSCULAR | Status: AC
  Administered 2017-09-27: 40 mg via INTRAVENOUS
  Filled 2017-09-27: qty 4

## 2017-09-27 MED ORDER — MOMETASONE FURO-FORMOTEROL FUM 200-5 MCG/ACT IN AERO
2.0000 | INHALATION_SPRAY | Freq: Two times a day (BID) | RESPIRATORY_TRACT | Status: DC
  Administered 2017-09-27: 2 via RESPIRATORY_TRACT
  Filled 2017-09-27: qty 8.8

## 2017-09-27 MED ORDER — POLYETHYLENE GLYCOL 3350 17 G PO PACK
17.0000 g | PACK | Freq: Once | ORAL | Status: AC
  Administered 2017-09-27: 17 g via ORAL
  Filled 2017-09-27: qty 1

## 2017-09-27 NOTE — Progress Notes (Addendum)
PROGRESS NOTE    Kelli Rivera  SAY:[id number] DOB: [birth year] DOA: 09/24/2017 PCP: Angelica Chessman, MD   Brief Narrative: 66 year old female with history of diabetes, hypertension, COPD with chronic hypoxia on 2 L of oxygen at home, asthma, GERD, obesity, iron deficiency anemia presented with shortness of breath, cough productive, fever chills and left fifth toe injury causing fracture. Patient is improving transferred out of ICU on 12/4  Assessment & Plan:   #COPD with acute exacerbation and sepsis: Patient with fever, productive cough, shortness of breath and wheezing, leukocytosis. -Plan to continue bronchodilators, antibiotics, Initially treated with Solu-Medrol IV. Now switched to oral prednisone   Blood culture no growth so far, respiratory panel negative.    Lactate level trended down Transfer to telemetry Taper steroids De-escalate antibiotics, discontinue Zosyn, vancomycin, change to oral Augmentin Leukocytosis secondary to steroids Desaturated with minimal activity, Patient is followed by pulmonologist in [facility] Dr. Su Monks  #Respiratory failure with chronic hypoxia on 2 L at baseline: Continue 2 L of oxygen, with desaturation on inspiration and movement. 2-D echo to evaluate patient's chronic dyspnea  #Hyperlipidemia: Continue Lipitor.  #Type 2 diabetes in obese patient: Continue current insulin , hyperglycemic secondary to steroids, taper steroids  #Injury and left fifth toe minimally displaced fracture: Continue supportive care.  Orthopedics consult requested and discussed with Dr. Ranell Patrick.  #Diarrhea and abdominal pain: CT scan of abdomen pelvis negative.  Lipase normal.  Likely viral etiology.  Continue supportive care.  Denies any nausea vomiting  #Tobacco abuse: Continue nicotine patch.  Patient was counseled.  # resting bradycardia-likely secondary to underlying sleep apnea, patient has an upcoming sleep study in [place], TSH within normal limits, cardiology  consulted for recommendations, per family request     DVT prophylaxis: Lovenox subcutaneous Code Status: Full code Family Communication: Transfer to telemetry, discussed with patient's daughter, anticipate discharge in 1-2 days     Consultants:   Orthopedics  Cardiology  Procedures: None Antimicrobials: Vanco and Zosyn. Started levofloxacin 12/3  Subjective: Denies any nausea vomiting diarrhea, chest pain shortness of breath have improved, desaturated with minimal activity  Objective: Vitals:   09/27/17 0400 09/27/17 0500 09/27/17 0800 09/27/17 0925  BP: (!) 114/54 (!) 117/51    Pulse: (!) 48 (!) 56  75  Resp: 13 16  18   Temp:   97.6 F (36.4 C)   TempSrc:   Axillary   SpO2: 96% 97%  97%  Weight:      Height:        Intake/Output Summary (Last 24 hours) at 09/27/2017 1132 Last data filed at 09/27/2017 0553 Gross per 24 hour  Intake 560 ml  Output 250 ml  Net 310 ml   Filed Weights   09/24/17 2200  Weight: 126.1 kg (278 lb)    Examination:  General exam: Looks ill with mild increased work of breathing. Respiratory system: Minimal diffuse expiratory wheeze with occasional crackle.  cardiovascular system: S1 & S2 heard, RRR.  No pedal edema. Gastrointestinal system: Abdomen is nondistended, soft and nontender. Normal bowel sounds heard. Central nervous system: Alert and oriented. No focal neurological deficits. Extremities: Symmetric 5 x 5 power. Skin: No rashes, lesions or ulcers Psychiatry: Judgement and insight appear normal. Mood & affect appropriate.     Data Reviewed: I have personally reviewed following labs and imaging studies  CBC: Recent Labs  Lab 09/24/17 1152 09/25/17 0806 09/27/17 0340  WBC 23.1* 21.2* 24.7*  HGB 11.9* 11.4* 11.1*  HCT 38.9 36.7 37.4  MCV 79.9 81.9 81.1  PLT 220 179 204   Basic Metabolic Panel: Recent Labs  Lab 09/24/17 1152 09/25/17 1452 09/26/17 0340 09/27/17 0340  NA 139  --   --  139  K 3.7  --   --  4.3    CL 107  --   --  105  CO2 27  --   --  28  GLUCOSE 136*  --   --  202*  BUN 8  --   --  16  CREATININE 0.83 0.76 0.71 0.68  CALCIUM 7.9*  --   --  8.5*   GFR: Estimated Creatinine Clearance: 89.7 mL/min (by C-G formula based on SCr of 0.68 mg/dL). Liver Function Tests: Recent Labs  Lab 09/24/17 1152 09/27/17 0340  AST 10* 16  ALT 10* 13*  ALKPHOS 82 80  BILITOT 0.4 0.4  PROT 5.5* 5.8*  ALBUMIN 2.5* 2.5*   Recent Labs  Lab 09/24/17 1152  LIPASE 22   No results for input(s): AMMONIA in the last 168 hours. Coagulation Profile: No results for input(s): INR, PROTIME in the last 168 hours. Cardiac Enzymes: No results for input(s): CKTOTAL, CKMB, CKMBINDEX, TROPONINI in the last 168 hours. BNP (last 3 results) No results for input(s): PROBNP in the last 8760 hours. HbA1C: No results for input(s): HGBA1C in the last 72 hours. CBG: Recent Labs  Lab 09/26/17 0838 09/26/17 1208 09/26/17 1718 09/26/17 2119 09/27/17 0748  GLUCAP 217* 226* 191* 209* 119*   Lipid Profile: No results for input(s): CHOL, HDL, LDLCALC, TRIG, CHOLHDL, LDLDIRECT in the last 72 hours. Thyroid Function Tests: Recent Labs    09/27/17 0856  TSH 1.206   Anemia Panel: No results for input(s): VITAMINB12, FOLATE, FERRITIN, TIBC, IRON, RETICCTPCT in the last 72 hours. Sepsis Labs: Recent Labs  Lab 09/24/17 1405 09/25/17 0322 09/25/17 0806  PROCALCITON  --  0.30  --   LATICACIDVEN 1.18 0.7 0.8    Recent Results (from the past 240 hour(s))  Blood Culture (routine x 2)     Status: None (Preliminary result)   Collection Time: 09/24/17  2:35 PM  Result Value Ref Range Status   Specimen Description BLOOD LEFT FOREARM  Final   Special Requests   Final    BOTTLES DRAWN AEROBIC AND ANAEROBIC Blood Culture results may not be optimal due to an excessive volume of blood received in culture bottles   Culture   Final    NO GROWTH 2 DAYS Performed at [facility], [address]  [place], [place] [address]    Report Status PENDING  Incomplete  Blood Culture (routine x 2)     Status: None (Preliminary result)   Collection Time: 09/24/17  2:35 PM  Result Value Ref Range Status   Specimen Description BLOOD RIGHT HAND  Final   Special Requests   Final    BOTTLES DRAWN AEROBIC AND ANAEROBIC Blood Culture results may not be optimal due to an inadequate volume of blood received in culture bottles   Culture   Final    NO GROWTH 2 DAYS Performed at [facility], [address] [place], [place] [address]    Report Status PENDING  Incomplete  MRSA PCR Screening     Status: Abnormal   Collection Time: 09/24/17  9:37 PM  Result Value Ref Range Status   MRSA by PCR POSITIVE (A) NEGATIVE Final    Comment:        The GeneXpert MRSA Assay (FDA approved  for NASAL specimens only), is one component of a comprehensive MRSA colonization surveillance program. It is not intended to diagnose MRSA infection nor to guide or monitor treatment for MRSA infections. RESULT CALLED TO, READ BACK BY AND VERIFIED WITH: R MCINTOSH AT 0333 ON 12.02.2018 BY NBROOKS   Respiratory Panel by PCR     Status: None   Collection Time: 09/25/17  2:40 AM  Result Value Ref Range Status   Adenovirus NOT DETECTED NOT DETECTED Final   Coronavirus 229E NOT DETECTED NOT DETECTED Final   Coronavirus HKU1 NOT DETECTED NOT DETECTED Final   Coronavirus NL63 NOT DETECTED NOT DETECTED Final   Coronavirus OC43 NOT DETECTED NOT DETECTED Final   Metapneumovirus NOT DETECTED NOT DETECTED Final   Rhinovirus / Enterovirus NOT DETECTED NOT DETECTED Final   Influenza A NOT DETECTED NOT DETECTED Final   Influenza B NOT DETECTED NOT DETECTED Final   Parainfluenza Virus 1 NOT DETECTED NOT DETECTED Final   Parainfluenza Virus 2 NOT DETECTED NOT DETECTED Final   Parainfluenza Virus 3 NOT DETECTED NOT DETECTED Final   Parainfluenza Virus 4 NOT DETECTED NOT DETECTED Final   Respiratory Syncytial Virus NOT DETECTED NOT  DETECTED Final   Bordetella pertussis NOT DETECTED NOT DETECTED Final   Chlamydophila pneumoniae NOT DETECTED NOT DETECTED Final   Mycoplasma pneumoniae NOT DETECTED NOT DETECTED Final    Comment: Performed at [facility], [address], [place], [place] [address]         Radiology Studies: No results found.      Scheduled Meds: . amoxicillin-clavulanate  1 tablet Oral Q12H  . aspirin EC  81 mg Oral Daily  . Chlorhexidine Gluconate Cloth  6 each Topical Q0600  . dextromethorphan-guaiFENesin  1 tablet Oral BID  . [START ON 09/28/2017] enoxaparin (LOVENOX) injection  60 mg Subcutaneous Q24H  . fluticasone  2 spray Each Nare Daily  . insulin aspart  0-9 Units Subcutaneous TID WC  . insulin glargine  50 Units Subcutaneous QHS  . ipratropium-albuterol  3 mL Nebulization TID  . mirabegron ER  50 mg Oral Daily  . montelukast  10 mg Oral QHS  . multivitamin with minerals   Oral Daily  . mupirocin ointment  1 application Nasal BID  . nicotine  21 mg Transdermal Daily  . pantoprazole  40 mg Oral Daily  . predniSONE  50 mg Oral Q breakfast  . tiZANidine  4 mg Oral TID  . traMADol  50 mg Oral TID   Continuous Infusions:    LOS: 3 days    Richarda Overlie, MD [facility] Pager [phone]  If 7PM-7AM, please contact night-coverage [email or web address] Password [email or web address] 09/27/2017, 11:32 AM

## 2017-09-27 NOTE — Evaluation (Addendum)
Physical Therapy Evaluation Patient Details Name: Kelli Rivera MRN: [id number] DOB: October 13, 1951 Today's Date: 09/27/2017   History of Present Illness  66 y.o. female admitted with COPD exacerbation and sepsis.  PMH includes:  Recent Lt fifth toe fracture; DM   Clinical Impression  The patient ambulated with RW x 50' then 30' on 3 liter [place]. Noted dyspnea, oxygen  Saturation > 90%. Patient adamant about not going to SNF/reahb. Recommend HH therapies. Pt admitted with above diagnosis. Pt currently with functional limitations due to the deficits listed below (see PT Problem List).  Pt will benefit from skilled PT to increase their independence and safety with mobility to allow discharge to the venue listed below.       Follow Up Recommendations Home health PT    Equipment Recommendations  4 wheeled RW, bariatric  Recommendations for Other Services       Precautions / Restrictions Precautions Precautions: Fall Precaution Comments: monitor sats onof Oxygen Required Braces or Orthoses: Other Brace/Splint Other Brace/Splint: post op shoe Lt foot       Mobility  Bed Mobility Overal bed mobility: Needs Assistance Bed Mobility: Supine to Sit     Supine to sit: Min guard     General bed mobility comments: no assist for bed mobility  Transfers Overall transfer level: Needs assistance Equipment used: Rolling walker (2 wheeled) Transfers: Sit to/from Stand Sit to Stand: Min guard         General transfer comment: assist to steady   Ambulation/Gait Ambulation/Gait assistance: Min assist;+2 safety/equipment Ambulation Distance (Feet): 50 Feet(then 30') Assistive device: Rolling walker (2 wheeled) Gait Pattern/deviations: Step-through pattern;Antalgic;Step-to pattern Gait velocity: decreased   General Gait Details: slow pace, encouraged pursed lip breaths, noted increased SOB(3/4) after second walk. Oxygen on 3 liters > 88%.  Stairs            Wheelchair Mobility     Modified Rankin (Stroke Patients Only)       Balance                                             Pertinent Vitals/Pain Pain Assessment: No/denies pain    Home Living Family/patient expects to be discharged to:: Private residence Living Arrangements: Alone Available Help at Discharge: Family;Available PRN/intermittently Type of Home: Apartment Home Access: Stairs to enter   Entrance Stairs-Number of Steps: 1 Home Layout: One level Home Equipment: Cane - single point;Walker - 2 wheels;Shower seat Additional Comments: per patient, return to her home    Prior Function Level of Independence: Independent         Comments: She reports daughter assists with grocery shopping as she has a difficult time carrying heavy items      Hand Dominance        Extremity/Trunk Assessment   Upper Extremity Assessment Upper Extremity Assessment: Generalized weakness    Lower Extremity Assessment Lower Extremity Assessment: Generalized weakness    Cervical / Trunk Assessment Cervical / Trunk Assessment: Normal  Communication   Communication: No difficulties  Cognition Arousal/Alertness: Awake/alert Behavior During Therapy: WFL for tasks assessed/performed Overall Cognitive Status: Within Functional Limits for tasks assessed                                        General Comments  Exercises     Assessment/Plan    PT Assessment Patient needs continued PT services  PT Problem List Decreased strength;Decreased activity tolerance;Cardiopulmonary status limiting activity;Decreased mobility       PT Treatment Interventions DME instruction;Gait training;Functional mobility training;Therapeutic activities;Patient/family education    PT Goals (Current goals can be found in the Care Plan section)  Acute Rehab PT Goals Patient Stated Goal: to get stronger , go home, not to rehab PT Goal Formulation: With patient Time For Goal  Achievement: 10/11/17 Potential to Achieve Goals: Good    Frequency Min 2X/week   Barriers to discharge        Co-evaluation               AM-PAC PT "6 Clicks" Daily Activity  Outcome Measure Difficulty turning over in bed (including adjusting bedclothes, sheets and blankets)?: A Little Difficulty moving from lying on back to sitting on the side of the bed? : A Little Difficulty sitting down on and standing up from a chair with arms (e.g., wheelchair, bedside commode, etc,.)?: A Little Help needed moving to and from a bed to chair (including a wheelchair)?: A Little Help needed walking in hospital room?: A Little Help needed climbing 3-5 steps with a railing? : A Lot 6 Click Score: 17    End of Session Equipment Utilized During Treatment: Oxygen Activity Tolerance: Patient tolerated treatment well Patient left: in chair;with call bell/phone within reach Nurse Communication: Mobility status PT Visit Diagnosis: Difficulty in walking, not elsewhere classified (R26.2)    Time: 6834-1962 PT Time Calculation (min) (ACUTE ONLY): 30 min   Charges:   PT Evaluation $PT Eval Low Complexity: 1 Low PT Treatments $Gait Training: 8-22 mins   PT G CodesBlanchard Kelch PT [phone]   Rada Hay 09/27/2017, 2:10 PM

## 2017-09-27 NOTE — Consult Note (Signed)
CONSULTATION NOTE   Patient Name: Kelli Rivera Date of Encounter: 09/27/2017 Cardiologist: None (NEW)  Chief Complaint   Shortness of breath  Patient Profile   66 yo female with a history of COPD, DM2, dyslipidemia, morbid obesity, asthma and collagen vascular disease, presented with cough, shortness of breath, fever, chills, abdominal pain and left fifth toe injury, found to have COPD with acute exacerbation and sepsis.  She has been noted to be bradycardic and cardiology was consulted.  HPI   Kelli Rivera is a 67 y.o. female who is being seen today for the evaluation of bradycardia at the request of Dr. Allyson Sabal. This is a female with a history of COPD, DM2, dyslipidemia, morbid obesity, asthma and collagen vascular disease, who presented with cough, shortness of breath, fever, chills, abdominal pain and left fifth toe injury, found to have COPD with acute exacerbation and sepsis.  She also has a nondisplaced fifth phalanx fracture which was treated by buddy taping. She has been noted to be bradycardic and cardiology was consulted.  She reports having a history of sleep apnea and used CPAP for several years however recent sleep study suggested that she no longer needed it.  She was referred back for a study which is supposedly scheduled later this month in [place].  She says she had previously seen a cardiologist with [place] who did a workup and apparently did not find any cause of her bradycardia in the past.  She is noted to have bradycardia down into the upper 30s here, mostly at night on my review of the telemetry.  Heart rate today is in the 60s and 70s and she is not symptomatic with that.  She reports some chest discomfort however worse with coughing and taking a deep breath.  She does have a significant cough and wheeze.  PMHx   Past Medical History:  Diagnosis Date  . Asthma   . Collagen vascular disease ([place])   . COPD (chronic obstructive pulmonary disease) (H[facility])      O2 increased to 3L while ambulating, 2L at rest as of last week  . Diabetes mellitus without complication ([place])   . High cholesterol   . 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 [place]bid [place]     Past Surgical History:  Procedure Laterality Date  . ABDOMINAL HYSTERECTOMY    . APPENDECTOMY     age 89yrs  . bladder tuck    . CHOLECYSTECTOMY      FAMHx   Family History  Problem Relation Age of Onset  . Heart failure Mother 472 . Lung cancer Father 622   SOCHx    reports that she quit smoking about 11 years ago. She has a 42.00 pack-year smoking history. she has never used smokeless tobacco. She reports that she does not drink alcohol or use drugs.  Outpatient Medications   No current facility-administered medications on file prior to encounter.    Current Outpatient Medications on File Prior to Encounter  Medication Sig Dispense Refill  . albuterol (PROVENTIL HFA;VENTOLIN HFA) 108 (90 Base) MCG/ACT inhaler Inhale 2 puffs into the lungs every 4 (four) hours as needed for wheezing or shortness of breath.    .Marland Kitchenaspirin EC 81 MG tablet Take 81 mg by mouth daily.    . fluticasone (FLONASE) 50 MCG/ACT nasal spray Place 2 sprays into the nose daily.    . Fluticasone-Salmeterol (ADVAIR DISKUS) 500-50 MCG/DOSE  AEPB Inhale 1 puff into the lungs every 12 (twelve) hours.    . folic acid (FOLVITE) 1 MG tablet Take 1 mg by mouth at bedtime.    . furosemide (LASIX) 40 MG tablet Take 40 mg by mouth daily as needed for fluid or edema.     Marland Kitchen ibuprofen (ADVIL,MOTRIN) 800 MG tablet Take 800 mg by mouth every 8 (eight) hours as needed for headache, mild pain or moderate pain.   1  . metFORMIN (GLUCOPHAGE) 1000 MG tablet Take 1,000 mg by mouth at bedtime.     . methotrexate (50 MG/ML) 1 g injection Inject 6 mg into the vein every Saturday.    . mirabegron ER (MYRBETRIQ) 50 MG TB24 tablet Take 50 mg by mouth daily.    . montelukast (SINGULAIR) 10 MG  tablet Take 10 mg by mouth at bedtime.    Marland Kitchen omeprazole (PRILOSEC) 40 MG capsule Take 40 mg by mouth at bedtime.     Marland Kitchen tiZANidine (ZANAFLEX) 4 MG tablet Take 4 mg by mouth every 8 (eight) hours as needed for muscle spasms.     Nelva Nay SOLOSTAR 300 UNIT/ML SOPN Inject 34 Units into the skin at bedtime.     . traMADol (ULTRAM) 50 MG tablet Take 50 mg by mouth 3 (three) times daily as needed for moderate pain or severe pain.     . TRULICITY 1.5 EX/9.3ZJ SOPN Inject 1.5 mg into the skin every Thursday.  2    Inpatient Medications    Scheduled Meds: . amoxicillin-clavulanate  1 tablet Oral Q12H  . aspirin EC  81 mg Oral Daily  . Chlorhexidine Gluconate Cloth  6 each Topical Q0600  . dextromethorphan-guaiFENesin  1 tablet Oral BID  . [START ON 09/28/2017] enoxaparin (LOVENOX) injection  60 mg Subcutaneous Q24H  . fluticasone  2 spray Each Nare Daily  . insulin aspart  0-9 Units Subcutaneous TID WC  . insulin glargine  50 Units Subcutaneous QHS  . ipratropium-albuterol  3 mL Nebulization TID  . mirabegron ER  50 mg Oral Daily  . montelukast  10 mg Oral QHS  . multivitamin with minerals   Oral Daily  . mupirocin ointment  1 application Nasal BID  . nicotine  21 mg Transdermal Daily  . pantoprazole  40 mg Oral Daily  . predniSONE  50 mg Oral Q breakfast  . tiZANidine  4 mg Oral TID  . traMADol  50 mg Oral TID    Continuous Infusions:   PRN Meds: acetaminophen, albuterol, morphine injection, ondansetron   ALLERGIES   Allergies  Allergen Reactions  . Liraglutide Nausea And Vomiting  . Pioglitazone Swelling  . Amoxicillin-Pot Clavulanate Nausea And Vomiting    ROS   Pertinent items noted in HPI and remainder of comprehensive ROS otherwise negative.  Vitals   Vitals:   09/27/17 0500 09/27/17 0800 09/27/17 0925 09/27/17 1200  BP: (!) 117/51     Pulse: (!) 56  75   Resp: 16  18   Temp:  97.6 F (36.4 C)  97.8 F (36.6 C)  TempSrc:  Axillary  Oral  SpO2: 97%  97%    Weight:      Height:        Intake/Output Summary (Last 24 hours) at 09/27/2017 1308 Last data filed at 09/27/2017 0553 Gross per 24 hour  Intake 390 ml  Output 250 ml  Net 140 ml   Filed Weights   09/24/17 2200  Weight: 278 lb (126.1 kg)  Physical Exam   General appearance: alert, no distress and morbidly obese Neck: no carotid bruit, no JVD and thyroid not enlarged, symmetric, no tenderness/mass/nodules Lungs: diminished breath sounds bilaterally and wheezes bilaterally Heart: regular rate and rhythm, S1, S2 normal, no murmur, click, rub or gallop Abdomen: soft, non-tender; bowel sounds normal; no masses,  no organomegaly Extremities: edema Trace sock line Pulses: 2+ and symmetric Skin: Skin color, texture, turgor normal. No rashes or lesions Neurologic: Grossly normal Psych: Pleasant  Labs   Results for orders placed or performed during the hospital encounter of 09/24/17 (from the past 48 hour(s))  Creatinine, serum     Status: None   Collection Time: 09/25/17  2:52 PM  Result Value Ref Range   Creatinine, Ser 0.76 0.44 - 1.00 mg/dL   GFR calc non Af Amer >60 >60 mL/min   GFR calc Af Amer >60 >60 mL/min    Comment: (NOTE) The eGFR has been calculated using the CKD EPI equation. This calculation has not been validated in all clinical situations. eGFR's persistently <60 mL/min signify possible Chronic Kidney Disease.   Glucose, capillary     Status: Abnormal   Collection Time: 09/25/17  4:47 PM  Result Value Ref Range   Glucose-Capillary 198 (H) 65 - 99 mg/dL  Glucose, capillary     Status: Abnormal   Collection Time: 09/25/17  9:23 PM  Result Value Ref Range   Glucose-Capillary 228 (H) 65 - 99 mg/dL   Comment 1 Notify RN    Comment 2 Document in Chart   Creatinine, serum     Status: None   Collection Time: 09/26/17  3:40 AM  Result Value Ref Range   Creatinine, Ser 0.71 0.44 - 1.00 mg/dL   GFR calc non Af Amer >60 >60 mL/min   GFR calc Af Amer >60 >60  mL/min    Comment: (NOTE) The eGFR has been calculated using the CKD EPI equation. This calculation has not been validated in all clinical situations. eGFR's persistently <60 mL/min signify possible Chronic Kidney Disease.   Glucose, capillary     Status: Abnormal   Collection Time: 09/26/17  8:38 AM  Result Value Ref Range   Glucose-Capillary 217 (H) 65 - 99 mg/dL  Glucose, capillary     Status: Abnormal   Collection Time: 09/26/17 12:08 PM  Result Value Ref Range   Glucose-Capillary 226 (H) 65 - 99 mg/dL  Glucose, capillary     Status: Abnormal   Collection Time: 09/26/17  5:18 PM  Result Value Ref Range   Glucose-Capillary 191 (H) 65 - 99 mg/dL  Glucose, capillary     Status: Abnormal   Collection Time: 09/26/17  9:19 PM  Result Value Ref Range   Glucose-Capillary 209 (H) 65 - 99 mg/dL   Comment 1 Notify RN    Comment 2 Document in Chart   CBC     Status: Abnormal   Collection Time: 09/27/17  3:40 AM  Result Value Ref Range   WBC 24.7 (H) 4.0 - 10.5 K/uL   RBC 4.61 3.87 - 5.11 MIL/uL   Hemoglobin 11.1 (L) 12.0 - 15.0 g/dL   HCT 37.4 36.0 - 46.0 %   MCV 81.1 78.0 - 100.0 fL   MCH 24.1 (L) 26.0 - 34.0 pg   MCHC 29.7 (L) 30.0 - 36.0 g/dL   RDW 18.2 (H) 11.5 - 15.5 %   Platelets 204 150 - 400 K/uL    Comment: REPEATED TO VERIFY  Comprehensive metabolic panel  Status: Abnormal   Collection Time: 09/27/17  3:40 AM  Result Value Ref Range   Sodium 139 135 - 145 mmol/L   Potassium 4.3 3.5 - 5.1 mmol/L   Chloride 105 101 - 111 mmol/L   CO2 28 22 - 32 mmol/L   Glucose, Bld 202 (H) 65 - 99 mg/dL   BUN 16 6 - 20 mg/dL   Creatinine, Ser 0.68 0.44 - 1.00 mg/dL   Calcium 8.5 (L) 8.9 - 10.3 mg/dL   Total Protein 5.8 (L) 6.5 - 8.1 g/dL   Albumin 2.5 (L) 3.5 - 5.0 g/dL   AST 16 15 - 41 U/L   ALT 13 (L) 14 - 54 U/L   Alkaline Phosphatase 80 38 - 126 U/L   Total Bilirubin 0.4 0.3 - 1.2 mg/dL   GFR calc non Af Amer >60 >60 mL/min   GFR calc Af Amer >60 >60 mL/min    Comment:  (NOTE) The eGFR has been calculated using the CKD EPI equation. This calculation has not been validated in all clinical situations. eGFR's persistently <60 mL/min signify possible Chronic Kidney Disease.    Anion gap 6 5 - 15  Glucose, capillary     Status: Abnormal   Collection Time: 09/27/17  7:48 AM  Result Value Ref Range   Glucose-Capillary 119 (H) 65 - 99 mg/dL   Comment 1 Notify RN    Comment 2 Document in Chart   TSH     Status: None   Collection Time: 09/27/17  8:56 AM  Result Value Ref Range   TSH 1.206 0.350 - 4.500 uIU/mL    Comment: Performed by a 3rd Generation assay with a functional sensitivity of <=0.01 uIU/mL.  Culture, sputum-assessment     Status: None   Collection Time: 09/27/17  9:37 AM  Result Value Ref Range   Specimen Description SPUTUM    Special Requests NONE    Sputum evaluation THIS SPECIMEN IS ACCEPTABLE FOR SPUTUM CULTURE    Report Status 09/27/2017 FINAL     ECG   Sinus tachycardia with PVCs (ER EKG from 12/2)- Personally Reviewed  Telemetry   Predominantly sinus rhythm with sinus bradycardia in the upper 30s-40s at night- Personally Reviewed  Radiology   No results found.  Cardiac Studies   Pending  Impression   1. Principal Problem: 2.   COPD with acute exacerbation ([place]) 3. Active Problems: 4.   Generalized weakness 5.   Hyperlipidemia 6.   Diabetes mellitus type 2 in obese ([place]) 7.   Sepsis ([place]) 8.   Diarrhea 9.   Toe fracture, left 10.   GERD (gastroesophageal reflux disease) 11.   Iron deficiency 12.   Tobacco abuse 13.   Abdominal pain 14.   Recommendation   1. Mrs. Goslin has an asymptomatic nocturnal bradycardia, likely related to obstructive sleep apnea.  No AV nodal blocking agents.  I would avoid those going forward.  Consider overnight oximetry while hospitalized.  Agree with an echocardiogram to rule out any possible cardiomyopathy.  Otherwise we will not perform any additional inpatient testing.  Strongly  recommend outpatient sleep study which apparently has already been arranged.  Thanks for the consultation.  We will follow up on the echo results tomorrow.  Time Spent Directly with Patient:  I have spent a total of 45 minutes with the patient reviewing hospital notes, telemetry, EKGs, labs and examining the patient as well as establishing an assessment and plan that was discussed personally with the patient. > 50% of  time was spent in direct patient care.  Length of Stay:  LOS: 3 days   Pixie Casino, MD, [facility], [place] Director of the [facility]  [facility] Attending Cardiologist  Direct Dial: [phone]  Fax: [phone]  Website:  [email or web address] [email or web address] Asaf Elmquist 09/27/2017, 1:08 PM

## 2017-09-28 ENCOUNTER — Inpatient Hospital Stay (HOSPITAL_COMMUNITY): Payer: Medicare Other

## 2017-09-28 DIAGNOSIS — E669 Obesity, unspecified: Secondary | ICD-10-CM

## 2017-09-28 DIAGNOSIS — K219 Gastro-esophageal reflux disease without esophagitis: Secondary | ICD-10-CM

## 2017-09-28 DIAGNOSIS — R001 Bradycardia, unspecified: Secondary | ICD-10-CM

## 2017-09-28 DIAGNOSIS — J9601 Acute respiratory failure with hypoxia: Secondary | ICD-10-CM

## 2017-09-28 DIAGNOSIS — R0600 Dyspnea, unspecified: Secondary | ICD-10-CM

## 2017-09-28 DIAGNOSIS — E1169 Type 2 diabetes mellitus with other specified complication: Secondary | ICD-10-CM

## 2017-09-28 LAB — GLUCOSE, CAPILLARY
GLUCOSE-CAPILLARY: 263 mg/dL — AB (ref 65–99)
GLUCOSE-CAPILLARY: 170 mg/dL — AB (ref 65–99)
Glucose-Capillary: 92 mg/dL (ref 65–99)
Glucose-Capillary: 212 mg/dL — ABNORMAL HIGH (ref 65–99)
Glucose-Capillary: 202 mg/dL — ABNORMAL HIGH (ref 65–99)

## 2017-09-28 LAB — CREATININE, SERUM: CREATININE: 0.72 mg/dL (ref 0.44–1.00)

## 2017-09-28 LAB — ECHOCARDIOGRAM COMPLETE
Height: 64 in
Weight: 4486.8 oz

## 2017-09-28 MED ORDER — GUAIFENESIN ER 600 MG PO TB12
1200.0000 mg | ORAL_TABLET | Freq: Two times a day (BID) | ORAL | Status: DC
  Administered 2017-09-28 – 2017-10-01 (×7): 1200 mg via ORAL
  Filled 2017-09-28 (×7): qty 2

## 2017-09-28 MED ORDER — METHYLPREDNISOLONE SODIUM SUCC 40 MG IJ SOLR
40.0000 mg | Freq: Three times a day (TID) | INTRAMUSCULAR | Status: DC
  Administered 2017-09-28 – 2017-09-30 (×7): 40 mg via INTRAVENOUS
  Filled 2017-09-28 (×7): qty 1

## 2017-09-28 MED ORDER — BUDESONIDE 0.25 MG/2ML IN SUSP
0.2500 mg | Freq: Two times a day (BID) | RESPIRATORY_TRACT | Status: DC
  Administered 2017-09-28 – 2017-10-01 (×6): 0.25 mg via RESPIRATORY_TRACT
  Filled 2017-09-28 (×6): qty 2

## 2017-09-28 MED ORDER — LORATADINE 10 MG PO TABS
10.0000 mg | ORAL_TABLET | Freq: Every day | ORAL | Status: DC
  Administered 2017-09-28 – 2017-10-01 (×4): 10 mg via ORAL
  Filled 2017-09-28 (×4): qty 1

## 2017-09-28 MED ORDER — FUROSEMIDE 10 MG/ML IJ SOLN
40.0000 mg | Freq: Every day | INTRAMUSCULAR | Status: DC
  Administered 2017-09-28 – 2017-09-29 (×2): 40 mg via INTRAVENOUS
  Filled 2017-09-28 (×2): qty 4

## 2017-09-28 MED ORDER — IPRATROPIUM-ALBUTEROL 0.5-2.5 (3) MG/3ML IN SOLN
3.0000 mL | Freq: Four times a day (QID) | RESPIRATORY_TRACT | Status: DC
  Administered 2017-09-28 – 2017-09-29 (×6): 3 mL via RESPIRATORY_TRACT
  Filled 2017-09-28 (×6): qty 3

## 2017-09-28 MED ORDER — OXYMETAZOLINE HCL 0.05 % NA SOLN
1.0000 | Freq: Two times a day (BID) | NASAL | Status: AC
  Administered 2017-09-28 – 2017-09-30 (×6): 1 via NASAL
  Filled 2017-09-28: qty 15

## 2017-09-28 NOTE — Progress Notes (Signed)
  Echocardiogram 2D Echocardiogram has been performed.  Kelli Rivera L Androw 09/28/2017, 12:33 PM

## 2017-09-28 NOTE — Progress Notes (Signed)
PROGRESS NOTE        PATIENT DETAILS Name: Kelli Rivera Age: 66 y.o. Sex: female Date of Birth: 08/13/1951 Admit Date: 09/24/2017 Admitting Physician Lorretta Harp, MD SFK:CLEXNT, Genia Del, MD  Brief Narrative: Patient is a 66 y.o. female with history of COPD, chronic hypoxemic respiratory failure on home O2, obesity-admitted to the hospitalist service for COPD exacerbation. See below for further details  Subjective: Continues to cough-wheezing-although better, not yet back to her baseline.  Assessment/Plan: Acute and chronic hypoxemic respiratory failure secondary to COPD exacerbation: Still wheezing-short of breath when she moves around-restart IV steroids, continue bronchodilators and Mucinex-start incentive spirometry and flutter valve. She does complain of nasal congestion and some postnasal drip-start Claritin/Flonase and Afrin. I do not think she has sepsis-chest CT did not show any pneumonia-blood/sputum cultures continue to be negative-we will go ahead and discontinue all antimicrobial therapy.  Insulin-dependent type 2 diabetes: CBGs relatively stable-expect some amount of hyperglycemia due to steroids, continue Lantus 50 units and SSI.  Sinus bradycardia: This is mostly nocturnal bradycardia-likely related to undiagnosed sleep apnea, cardiology following. Appreciate input.  Probable OSA: Per patient and family-they are scheduled for outpatient polysomnography. Having encouraged family/patient to keep this appointment.  Chronic lower extremity edema: Continue furosemide.  GERD: Continue PPI  Left fifth toe fracture: Await orthopedic evaluation-but doubt requires any surgery-continue supportive care.  DVT Prophylaxis: Prophylactic Lovenox   Code Status: Full code   Family Communication: Daughter at bedside  Disposition Plan: Remain inpatient-home in the next 1-2 days if clinical improvement continues.  Antimicrobial agents: Anti-infectives  (From admission, onward)   Start     Dose/Rate Route Frequency Ordered Stop   09/26/17 1200  levofloxacin (LEVAQUIN) tablet 500 mg  Status:  Discontinued     500 mg Oral Daily 09/26/17 0939 09/26/17 1048   09/26/17 1200  amoxicillin-clavulanate (AUGMENTIN) 875-125 MG per tablet 1 tablet     1 tablet Oral Every 12 hours 09/26/17 1048     09/25/17 1800  vancomycin (VANCOCIN) IVPB 750 mg/150 ml premix  Status:  Discontinued     750 mg 150 mL/hr over 60 Minutes Intravenous Every 12 hours 09/25/17 1639 09/26/17 0940   09/25/17 0500  vancomycin (VANCOCIN) IVPB 1000 mg/200 mL premix  Status:  Discontinued     1,000 mg 200 mL/hr over 60 Minutes Intravenous Every 12 hours 09/24/17 1427 09/25/17 1639   09/24/17 2100  piperacillin-tazobactam (ZOSYN) IVPB 3.375 g  Status:  Discontinued     3.375 g 12.5 mL/hr over 240 Minutes Intravenous Every 8 hours 09/24/17 1427 09/26/17 0940   09/24/17 1645  oseltamivir (TAMIFLU) capsule 75 mg     75 mg Oral  Once 09/24/17 1632 09/24/17 1723   09/24/17 1530  vancomycin (VANCOCIN) IVPB 1000 mg/200 mL premix     1,000 mg 200 mL/hr over 60 Minutes Intravenous  Once 09/24/17 1423 09/24/17 1650   09/24/17 1415  piperacillin-tazobactam (ZOSYN) IVPB 3.375 g     3.375 g 100 mL/hr over 30 Minutes Intravenous  Once 09/24/17 1411 09/24/17 1535   09/24/17 1415  vancomycin (VANCOCIN) IVPB 1000 mg/200 mL premix     1,000 mg 200 mL/hr over 60 Minutes Intravenous  Once 09/24/17 1411 09/24/17 1753      Procedures: Echo 12/4>>pending  CONSULTS:  cardiology  Time spent: 25- minutes-Greater than 50% of this time was spent in  counseling, explanation of diagnosis, planning of further management, and coordination of care.  MEDICATIONS: Scheduled Meds: . amoxicillin-clavulanate  1 tablet Oral Q12H  . aspirin EC  81 mg Oral Daily  . budesonide (PULMICORT) nebulizer solution  0.25 mg Nebulization BID  . Chlorhexidine Gluconate Cloth  6 each Topical Q0600  . enoxaparin  (LOVENOX) injection  60 mg Subcutaneous Q24H  . fluticasone  2 spray Each Nare Daily  . furosemide  40 mg Intravenous Daily  . guaiFENesin  1,200 mg Oral BID  . insulin aspart  0-9 Units Subcutaneous TID WC  . insulin glargine  50 Units Subcutaneous QHS  . ipratropium-albuterol  3 mL Nebulization Q6H  . loratadine  10 mg Oral Daily  . methylPREDNISolone (SOLU-MEDROL) injection  40 mg Intravenous Q8H  . mirabegron ER  50 mg Oral Daily  . montelukast  10 mg Oral QHS  . multivitamin with minerals   Oral Daily  . mupirocin ointment  1 application Nasal BID  . oxymetazoline  1 spray Each Nare BID  . pantoprazole  40 mg Oral Daily  . tiZANidine  4 mg Oral TID  . traMADol  50 mg Oral TID   Continuous Infusions: PRN Meds:.acetaminophen, albuterol, morphine injection, ondansetron   PHYSICAL EXAM: Vital signs: Vitals:   09/28/17 0050 09/28/17 0052 09/28/17 0626 09/28/17 0735  BP: (!) 134/56  (!) 113/41   Pulse: (!) 54  (!) 53   Resp: (!) 21  20   Temp: 97.6 F (36.4 C)  97.8 F (36.6 C)   TempSrc: Oral  Oral   SpO2: 99%  98% 97%  Weight:  127.2 kg (280 lb 6.8 oz)    Height:  5\' 4"  (1.626 m)     Filed Weights   09/24/17 2200 09/28/17 0052  Weight: 126.1 kg (278 lb) 127.2 kg (280 lb 6.8 oz)   Body mass index is 48.13 kg/m.   General appearance :Awake, alert, not in any distress.  Eyes:, pupils equally reactive to light and accomodation,no scleral icterus.Pink conjunctiva HEENT: Atraumatic and Normocephalic Neck: supple, no JVD. No cervical lymphadenopathy. No thyromegaly Resp:Good air entry bilaterally, coarse wheezing all over  CVS: S1 S2 regular, no murmurs.  GI: Bowel sounds present, Non tender and not distended with no gaurding, rigidity or rebound.No organomegaly Extremities: B/L Lower Ext shows 2+ edema, both legs are warm to touch Neurology:  speech clear,Non focal, sensation is grossly intact. Psychiatric: Normal judgment and insight. Alert and oriented x 3. Normal  mood. Musculoskeletal:No digital cyanosis Skin:No Rash, warm and dry Wounds:N/A  I have personally reviewed following labs and imaging studies  LABORATORY DATA: CBC: Recent Labs  Lab 09/24/17 1152 09/25/17 0806 09/27/17 0340  WBC 23.1* 21.2* 24.7*  HGB 11.9* 11.4* 11.1*  HCT 38.9 36.7 37.4  MCV 79.9 81.9 81.1  PLT 220 179 204    Basic Metabolic Panel: Recent Labs  Lab 09/24/17 1152 09/25/17 1452 09/26/17 0340 09/27/17 0340 09/28/17 0602  NA 139  --   --  139  --   K 3.7  --   --  4.3  --   CL 107  --   --  105  --   CO2 27  --   --  28  --   GLUCOSE 136*  --   --  202*  --   BUN 8  --   --  16  --   CREATININE 0.83 0.76 0.71 0.68 0.72  CALCIUM 7.9*  --   --  8.5*  --     GFR: Estimated Creatinine Clearance: 90.2 mL/min (by C-G formula based on SCr of 0.72 mg/dL).  Liver Function Tests: Recent Labs  Lab 09/24/17 1152 09/27/17 0340  AST 10* 16  ALT 10* 13*  ALKPHOS 82 80  BILITOT 0.4 0.4  PROT 5.5* 5.8*  ALBUMIN 2.5* 2.5*   Recent Labs  Lab 09/24/17 1152  LIPASE 22   No results for input(s): AMMONIA in the last 168 hours.  Coagulation Profile: No results for input(s): INR, PROTIME in the last 168 hours.  Cardiac Enzymes: No results for input(s): CKTOTAL, CKMB, CKMBINDEX, TROPONINI in the last 168 hours.  BNP (last 3 results) No results for input(s): PROBNP in the last 8760 hours.  HbA1C: No results for input(s): HGBA1C in the last 72 hours.  CBG: Recent Labs  Lab 09/26/17 1718 09/26/17 2119 09/27/17 0748 09/27/17 1226 09/28/17 0831  GLUCAP 191* 209* 119* 193* 92    Lipid Profile: No results for input(s): CHOL, HDL, LDLCALC, TRIG, CHOLHDL, LDLDIRECT in the last 72 hours.  Thyroid Function Tests: Recent Labs    09/27/17 0856  TSH 1.206  FREET4 0.98    Anemia Panel: No results for input(s): VITAMINB12, FOLATE, FERRITIN, TIBC, IRON, RETICCTPCT in the last 72 hours.  Urine analysis:    Component Value Date/Time   COLORURINE  YELLOW 09/24/2017 1412   APPEARANCEUR CLEAR 09/24/2017 1412   LABSPEC >1.030 (H) 09/24/2017 1412   PHURINE 5.5 09/24/2017 1412   GLUCOSEU NEGATIVE 09/24/2017 1412   HGBUR NEGATIVE 09/24/2017 1412   BILIRUBINUR SMALL (A) 09/24/2017 1412   KETONESUR NEGATIVE 09/24/2017 1412   PROTEINUR NEGATIVE 09/24/2017 1412   NITRITE NEGATIVE 09/24/2017 1412   LEUKOCYTESUR NEGATIVE 09/24/2017 1412    Sepsis Labs: Lactic Acid, Venous    Component Value Date/Time   LATICACIDVEN 0.8 09/25/2017 0806    MICROBIOLOGY: Recent Results (from the past 240 hour(s))  Blood Culture (routine x 2)     Status: None (Preliminary result)   Collection Time: 09/24/17  2:35 PM  Result Value Ref Range Status   Specimen Description BLOOD LEFT FOREARM  Final   Special Requests   Final    BOTTLES DRAWN AEROBIC AND ANAEROBIC Blood Culture results may not be optimal due to an excessive volume of blood received in culture bottles   Culture   Final    NO GROWTH 3 DAYS Performed at [facility], [address], [place], [place] [address]    Report Status PENDING  Incomplete  Blood Culture (routine x 2)     Status: None (Preliminary result)   Collection Time: 09/24/17  2:35 PM  Result Value Ref Range Status   Specimen Description BLOOD RIGHT HAND  Final   Special Requests   Final    BOTTLES DRAWN AEROBIC AND ANAEROBIC Blood Culture results may not be optimal due to an inadequate volume of blood received in culture bottles   Culture   Final    NO GROWTH 3 DAYS Performed at [facility], [address], [place], [place] [address]    Report Status PENDING  Incomplete  MRSA PCR Screening     Status: Abnormal   Collection Time: 09/24/17  9:37 PM  Result Value Ref Range Status   MRSA by PCR POSITIVE (A) NEGATIVE Final    Comment:        The GeneXpert MRSA Assay (FDA approved for NASAL specimens only), is one component of a comprehensive MRSA colonization surveillance program. It  is not intended to  diagnose MRSA infection nor to guide or monitor treatment for MRSA infections. RESULT CALLED TO, READ BACK BY AND VERIFIED WITH: R MCINTOSH AT 0333 ON 12.02.2018 BY NBROOKS   Respiratory Panel by PCR     Status: None   Collection Time: 09/25/17  2:40 AM  Result Value Ref Range Status   Adenovirus NOT DETECTED NOT DETECTED Final   Coronavirus 229E NOT DETECTED NOT DETECTED Final   Coronavirus HKU1 NOT DETECTED NOT DETECTED Final   Coronavirus NL63 NOT DETECTED NOT DETECTED Final   Coronavirus OC43 NOT DETECTED NOT DETECTED Final   Metapneumovirus NOT DETECTED NOT DETECTED Final   Rhinovirus / Enterovirus NOT DETECTED NOT DETECTED Final   Influenza A NOT DETECTED NOT DETECTED Final   Influenza B NOT DETECTED NOT DETECTED Final   Parainfluenza Virus 1 NOT DETECTED NOT DETECTED Final   Parainfluenza Virus 2 NOT DETECTED NOT DETECTED Final   Parainfluenza Virus 3 NOT DETECTED NOT DETECTED Final   Parainfluenza Virus 4 NOT DETECTED NOT DETECTED Final   Respiratory Syncytial Virus NOT DETECTED NOT DETECTED Final   Bordetella pertussis NOT DETECTED NOT DETECTED Final   Chlamydophila pneumoniae NOT DETECTED NOT DETECTED Final   Mycoplasma pneumoniae NOT DETECTED NOT DETECTED Final    Comment: Performed at [facility], [address], [place], [place] [address]  Culture, sputum-assessment     Status: None   Collection Time: 09/27/17  9:37 AM  Result Value Ref Range Status   Specimen Description SPUTUM  Final   Special Requests NONE  Final   Sputum evaluation THIS SPECIMEN IS ACCEPTABLE FOR SPUTUM CULTURE  Final   Report Status 09/27/2017 FINAL  Final  Culture, respiratory (NON-Expectorated)     Status: None (Preliminary result)   Collection Time: 09/27/17  9:37 AM  Result Value Ref Range Status   Specimen Description SPUTUM  Final   Special Requests NONE Reflexed from [id number]  Final   Gram Stain   Final    RARE WBC PRESENT, PREDOMINANTLY PMN FEW SQUAMOUS EPITHELIAL CELLS  PRESENT FEW YEAST WITH PSEUDOHYPHAE    Culture   Final    CULTURE REINCUBATED FOR BETTER GROWTH Performed at [facility], [address], [place], [place] [address]    Report Status PENDING  Incomplete    RADIOLOGY STUDIES/RESULTS: Dg Chest 2 View  Result Date: 09/24/2017 CLINICAL DATA:  Cough, congestion EXAM: CHEST  2 VIEW COMPARISON:  09/22/2016 FINDINGS: Heart is borderline in size. Lingular scarring. Mild vascular congestion. No overt edema or acute opacities. No effusions or acute bony abnormality. IMPRESSION: Borderline heart size.  Mild vascular congestion. Lingular scarring. Electronically Signed   By: Charlett Nose M.D.   On: 09/24/2017 12:37   Ct Chest W Contrast  Result Date: 09/24/2017 CLINICAL DATA:  Diabetic patient with generalized weakness. Nausea and vomiting. EXAM: CT CHEST, ABDOMEN, AND PELVIS WITH CONTRAST TECHNIQUE: Multidetector CT imaging of the chest, abdomen and pelvis was performed following the standard protocol during bolus administration of intravenous contrast. CONTRAST:  ISOVUE-300 IOPAMIDOL (ISOVUE-300) INJECTION 61% COMPARISON:  Abdomen CT April 26, 2015 FINDINGS: CT CHEST FINDINGS Cardiovascular: The heart size is borderline. No obvious coronary artery calcifications. The main pulmonary artery measures 3.6 cm. No filling defects in the pulmonary arteries on limited images. The thoracic aorta is normal in caliber. Taking motion artifact into account, no dissection. There is mild atherosclerosis in the arch. A superior aortic recess is identified with fluid adjacent to the aorta,  extending inferiorly. The underlying aorta is normal in appearance. Mediastinum/Nodes: Tiny nodules in the thyroid of doubtful significance. The esophagus is normal in appearance. No adenopathy. No effusions. Lungs/Pleura: Central airways are normal. No pneumothorax. Scattered atelectasis. No pulmonary nodules, masses, or focal infiltrates. Musculoskeletal: See below. CT ABDOMEN  PELVIS FINDINGS Hepatobiliary: The patient is status post hepatic steatosis. No biliary duct dilatation. Limited views of the pulmonary arteries are normal with opacification. No liver masses are seen. Pancreas: Unremarkable. No pancreatic ductal dilatation or surrounding inflammatory changes. Spleen: Normal in size without focal abnormality. Adrenals/Urinary Tract: There is a probable cyst in the lower left kidney, too small to characterize. No hydronephrosis, suspicious mass, or stone. No ureterectasis or ureteral stone. The bladder is normal. Stomach/Bowel: The stomach and small bowel are normal. A few scattered colonic diverticuli are identified without diverticulitis. The appendix is not seen but there is no evidence of appendicitis. By report, the patient is status post appendectomy. Vascular/Lymphatic: The abdominal aorta is normal in caliber with no dissection. No adenopathy. Reproductive: Status post hysterectomy. No adnexal masses. Other: No abdominal wall hernia or abnormality. No abdominopelvic ascites. Musculoskeletal: No acute or significant osseous findings. IMPRESSION: 1. No acute abnormalities identified to explain the patient's symptoms. 2. The main pulmonary artery measures 3.6 cm raising the possibility of pulmonary arterial hypertension. 3. 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 Aortic Atherosclerosis (ICD10-I70.0). Electronically Signed   By: Gerome Sam III M.D   On: 09/24/2017 15:51   Ct Abdomen Pelvis W Contrast  Result Date: 09/24/2017 CLINICAL DATA:  Diabetic patient with generalized weakness. Nausea and vomiting. EXAM: CT CHEST, ABDOMEN, AND PELVIS WITH CONTRAST TECHNIQUE: Multidetector CT imaging of the chest, abdomen and pelvis was performed following the standard protocol during bolus administration of intravenous contrast. CONTRAST:  ISOVUE-300 IOPAMIDOL (ISOVUE-300) INJECTION 61% COMPARISON:  Abdomen CT April 26, 2015 FINDINGS: CT CHEST FINDINGS Cardiovascular: The heart  size is borderline. No obvious coronary artery calcifications. The main pulmonary artery measures 3.6 cm. No filling defects in the pulmonary arteries on limited images. The thoracic aorta is normal in caliber. Taking motion artifact into account, no dissection. There is mild atherosclerosis in the arch. A superior aortic recess is identified with fluid adjacent to the aorta, extending inferiorly. The underlying aorta is normal in appearance. Mediastinum/Nodes: Tiny nodules in the thyroid of doubtful significance. The esophagus is normal in appearance. No adenopathy. No effusions. Lungs/Pleura: Central airways are normal. No pneumothorax. Scattered atelectasis. No pulmonary nodules, masses, or focal infiltrates. Musculoskeletal: See below. CT ABDOMEN PELVIS FINDINGS Hepatobiliary: The patient is status post hepatic steatosis. No biliary duct dilatation. Limited views of the pulmonary arteries are normal with opacification. No liver masses are seen. Pancreas: Unremarkable. No pancreatic ductal dilatation or surrounding inflammatory changes. Spleen: Normal in size without focal abnormality. Adrenals/Urinary Tract: There is a probable cyst in the lower left kidney, too small to characterize. No hydronephrosis, suspicious mass, or stone. No ureterectasis or ureteral stone. The bladder is normal. Stomach/Bowel: The stomach and small bowel are normal. A few scattered colonic diverticuli are identified without diverticulitis. The appendix is not seen but there is no evidence of appendicitis. By report, the patient is status post appendectomy. Vascular/Lymphatic: The abdominal aorta is normal in caliber with no dissection. No adenopathy. Reproductive: Status post hysterectomy. No adnexal masses. Other: No abdominal wall hernia or abnormality. No abdominopelvic ascites. Musculoskeletal: No acute or significant osseous findings. IMPRESSION: 1. No acute abnormalities identified to explain the patient's symptoms. 2. The main  pulmonary  artery measures 3.6 cm raising the possibility of pulmonary arterial hypertension. 3. 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 Aortic Atherosclerosis (ICD10-I70.0). Electronically Signed   By: Gerome Samavid  Williams III M.D   On: 09/24/2017 15:51   Dg Toe 5th Left  Result Date: 09/24/2017 CLINICAL DATA:  Hit left fifth toe yesterday.  Pain.  Bruising EXAM: DG TOE 5TH LEFT COMPARISON:  None. FINDINGS: Lucency at the base of the proximal phalanx of the left little toe compatible with fracture. No subluxation or dislocation. IMPRESSION: Minimally displaced fracture at the base of the proximal phalanx left little toe. Electronically Signed   By: Charlett NoseKevin  Dover M.D.   On: 09/24/2017 12:54     LOS: 4 days   Jeoffrey MassedShanker Ghimire, MD  [facility] Pager:[phone]  If 7PM-7AM, please contact night-coverage [email or web address] Password [email or web address] 09/28/2017, 12:10 PM

## 2017-09-28 NOTE — Care Management Important Message (Signed)
Important Message  Patient Details IM Letter given to Kathy/Case Manager to present to Patient Name: BRANNA CORTINA MRN: [id number] Date of Birth: [birth year]   Medicare Important Message Given:  Yes    Caren Macadam 09/28/2017, 12:18 PM

## 2017-09-28 NOTE — Progress Notes (Signed)
Occupational Therapy Treatment Patient Details Name: Kelli Rivera MRN: [id number] DOB: [birth year] Today's Date: 09/28/2017    History of present illness 66 y.o. female admitted with COPD exacerbation and sepsis.  PMH includes:  Recent Lt fifth toe fracture; DM    OT comments  Pt demonstrating improvement toward OT goals. She was able to demonstrate improved activity tolerance for bed mobility in preparation for grooming tasks seated at EOB requiring overall min guard assistance. Pt additionally able to complete stand-pivot simulated toilet transfer after episode of urinary incontinence with overall min assist to power up and steady. Pt would continue to benefit from OT services while admitted to maximize return to PLOF. D/C recommendation remains appropriate.    Follow Up Recommendations  Home health OT;Supervision/Assistance - 24 hour    Equipment Recommendations  None recommended by OT    Recommendations for Other Services      Precautions / Restrictions Precautions Precautions: Fall Precaution Comments: watch O2 sats Required Braces or Orthoses: Other Brace/Splint Other Brace/Splint: post op shoe Lt foot  Restrictions Weight Bearing Restrictions: Yes       Mobility Bed Mobility Overal bed mobility: Needs Assistance Bed Mobility: Supine to Sit     Supine to sit: Min guard     General bed mobility comments: Increased time but no physical assistance  Transfers Overall transfer level: Needs assistance Equipment used: Rolling walker (2 wheeled) Transfers: Sit to/from Stand Sit to Stand: Min assist Stand pivot transfers: Min assist       General transfer comment: Assist to power up and to steady.     Balance                                           ADL either performed or assessed with clinical judgement   ADL Overall ADL's : Needs assistance/impaired     Grooming: Oral care;Set up;Sitting                   Toilet Transfer:  Minimal assistance;BSC;Stand-pivot Toilet Transfer Details (indicate cue type and reason): Simulated from bed to chair.          Functional mobility during ADLs: Minimal assistance;Rolling walker(stand-pivot) General ADL Comments: Pt continues to demonstrate decreased activity tolerance for ADL. However, she reports that mobility was less effortful this session than previous.      Vision   Vision Assessment?: No apparent visual deficits   Perception     Praxis      Cognition Arousal/Alertness: Awake/alert Behavior During Therapy: WFL for tasks assessed/performed Overall Cognitive Status: Within Functional Limits for tasks assessed                                          Exercises     Shoulder Instructions       General Comments SpO2 >90% throughout all activity on 2L O2 via [place]    Pertinent Vitals/ Pain       Pain Assessment: No/denies pain  Home Living                                          Prior Functioning/Environment  Frequency  Min 2X/week        Progress Toward Goals  OT Goals(current goals can now be found in the care plan section)  Progress towards OT goals: Progressing toward goals  Acute Rehab OT Goals Patient Stated Goal: to get stronger , go home, not to rehab OT Goal Formulation: With patient Time For Goal Achievement: 10/10/17 Potential to Achieve Goals: Good  Plan      Co-evaluation                 AM-PAC PT "6 Clicks" Daily Activity     Outcome Measure   Help from another person eating meals?: None Help from another person taking care of personal grooming?: A Little Help from another person toileting, which includes using toliet, bedpan, or urinal?: A Lot Help from another person bathing (including washing, rinsing, drying)?: A Lot Help from another person to put on and taking off regular upper body clothing?: A Little Help from another person to put on and taking off  regular lower body clothing?: A Lot 6 Click Score: 16    End of Session Equipment Utilized During Treatment: Rolling walker  OT Visit Diagnosis: Unsteadiness on feet (R26.81)   Activity Tolerance Patient limited by fatigue   Patient Left with call bell/phone within reach;in chair   Nurse Communication Other (comment);Mobility status(nurse tech - request to replace Pure Whick)        Time: 2992-4268 OT Time Calculation (min): 30 min  Charges: OT General Charges $OT Visit: 1 Visit OT Treatments $Self Care/Home Management : 23-37 mins  Doristine Section, MS OTR/L  Pager: [phone]    Kymora Sciara A Nahmir Zeidman 09/28/2017, 12:43 PM

## 2017-09-28 NOTE — Care Management Note (Signed)
Case Management Note  Patient Details  Name: Kelli Rivera MRN: [id number] Date of Birth: Mar 16, 1951  Subjective/Objective:66 y/o f admitted w/COPD exac. From home. Has regular rw, home 02-[employer]-has travel tank. PT/OT recc HHC, & 4wheeled rw(patient able to pay out of pocket). Ordered for HHPT/OT/aide/respiratory care-[facility] chosen-rep Drew aware of orders, & d/c in am.                    Action/Plan:d/c home w/HHC/dme.   Expected Discharge Date:                  Expected Discharge Plan:  Home w Home Health Services  In-House Referral:     Discharge planning Services  CM Consult  Post Acute Care Choice:  Durable Medical Equipment([employer]-home 02-has travel tank) Choice offered to:  Patient  DME Arranged:  Walker rolling with seat DME Agency:  [facility]  HH Arranged:  PT, OT, Respirator Therapy, Nurse's Aide HH Agency:  [facility]  Status of Service:  In process, will continue to follow  If discussed at Long Length of Stay Meetings, dates discussed:    Additional Comments:  Lanier Clam, RN 09/28/2017, 2:54 PM

## 2017-09-28 NOTE — Progress Notes (Signed)
DAILY PROGRESS NOTE   Patient Name: Kelli Rivera Date of Encounter: 09/28/2017  Chief Complaint   No complaints  Patient Profile   66 yo female with a history of COPD, DM2, dyslipidemia, morbid obesity, asthma and collagen vascular disease, presented with cough, shortness of breath, fever, chills, abdominal pain and left fifth toe injury, found to have COPD with acute exacerbation and sepsis.  She has been noted to be bradycardic and cardiology was consulted.  Subjective   Breathing better today. Telemetry overnight revealed predominant bradycardia overnight in the 40's, but improved today. Still suspect this is OSA related. I personally reviewed her echo today - normal LVEF, mild diastolic dysfunction, mild RAE - likely related to chronic lung disease/OSA.  Objective   Vitals:   09/28/17 0050 09/28/17 0052 09/28/17 0626 09/28/17 0735  BP: (!) 134/56  (!) 113/41   Pulse: (!) 54  (!) 53   Resp: (!) 21  20   Temp: 97.6 F (36.4 C)  97.8 F (36.6 C)   TempSrc: Oral  Oral   SpO2: 99%  98% 97%  Weight:  280 lb 6.8 oz (127.2 kg)    Height:  5' 4" (1.626 m)      Intake/Output Summary (Last 24 hours) at 09/28/2017 1344 Last data filed at 09/28/2017 1100 Gross per 24 hour  Intake 360 ml  Output 1950 ml  Net -1590 ml   Filed Weights   09/24/17 2200 09/28/17 0052  Weight: 278 lb (126.1 kg) 280 lb 6.8 oz (127.2 kg)    Physical Exam   General appearance: alert, no distress and morbidly obese Lungs: clear to auscultation bilaterally Heart: regular rate and rhythm Extremities: extremities normal, atraumatic, no cyanosis or edema Neurologic: Grossly normal  Inpatient Medications    Scheduled Meds: . aspirin EC  81 mg Oral Daily  . budesonide (PULMICORT) nebulizer solution  0.25 mg Nebulization BID  . Chlorhexidine Gluconate Cloth  6 each Topical Q0600  . enoxaparin (LOVENOX) injection  60 mg Subcutaneous Q24H  . fluticasone  2 spray Each Nare Daily  . furosemide  40 mg  Intravenous Daily  . guaiFENesin  1,200 mg Oral BID  . insulin aspart  0-9 Units Subcutaneous TID WC  . insulin glargine  50 Units Subcutaneous QHS  . ipratropium-albuterol  3 mL Nebulization Q6H  . loratadine  10 mg Oral Daily  . methylPREDNISolone (SOLU-MEDROL) injection  40 mg Intravenous Q8H  . mirabegron ER  50 mg Oral Daily  . montelukast  10 mg Oral QHS  . multivitamin with minerals   Oral Daily  . mupirocin ointment  1 application Nasal BID  . oxymetazoline  1 spray Each Nare BID  . pantoprazole  40 mg Oral Daily  . tiZANidine  4 mg Oral TID  . traMADol  50 mg Oral TID    Continuous Infusions:   PRN Meds: acetaminophen, albuterol, morphine injection, ondansetron   Labs   Results for orders placed or performed during the hospital encounter of 09/24/17 (from the past 48 hour(s))  Glucose, capillary     Status: Abnormal   Collection Time: 09/26/17  5:18 PM  Result Value Ref Range   Glucose-Capillary 191 (H) 65 - 99 mg/dL  Glucose, capillary     Status: Abnormal   Collection Time: 09/26/17  9:19 PM  Result Value Ref Range   Glucose-Capillary 209 (H) 65 - 99 mg/dL   Comment 1 Notify RN    Comment 2 Document in Chart   CBC  Status: Abnormal   Collection Time: 09/27/17  3:40 AM  Result Value Ref Range   WBC 24.7 (H) 4.0 - 10.5 K/uL   RBC 4.61 3.87 - 5.11 MIL/uL   Hemoglobin 11.1 (L) 12.0 - 15.0 g/dL   HCT 37.4 36.0 - 46.0 %   MCV 81.1 78.0 - 100.0 fL   MCH 24.1 (L) 26.0 - 34.0 pg   MCHC 29.7 (L) 30.0 - 36.0 g/dL   RDW 18.2 (H) 11.5 - 15.5 %   Platelets 204 150 - 400 K/uL    Comment: REPEATED TO VERIFY  Comprehensive metabolic panel     Status: Abnormal   Collection Time: 09/27/17  3:40 AM  Result Value Ref Range   Sodium 139 135 - 145 mmol/L   Potassium 4.3 3.5 - 5.1 mmol/L   Chloride 105 101 - 111 mmol/L   CO2 28 22 - 32 mmol/L   Glucose, Bld 202 (H) 65 - 99 mg/dL   BUN 16 6 - 20 mg/dL   Creatinine, Ser 0.68 0.44 - 1.00 mg/dL   Calcium 8.5 (L) 8.9 -  10.3 mg/dL   Total Protein 5.8 (L) 6.5 - 8.1 g/dL   Albumin 2.5 (L) 3.5 - 5.0 g/dL   AST 16 15 - 41 U/L   ALT 13 (L) 14 - 54 U/L   Alkaline Phosphatase 80 38 - 126 U/L   Total Bilirubin 0.4 0.3 - 1.2 mg/dL   GFR calc non Af Amer >60 >60 mL/min   GFR calc Af Amer >60 >60 mL/min    Comment: (NOTE) The eGFR has been calculated using the CKD EPI equation. This calculation has not been validated in all clinical situations. eGFR's persistently <60 mL/min signify possible Chronic Kidney Disease.    Anion gap 6 5 - 15  Glucose, capillary     Status: Abnormal   Collection Time: 09/27/17  7:48 AM  Result Value Ref Range   Glucose-Capillary 119 (H) 65 - 99 mg/dL   Comment 1 Notify RN    Comment 2 Document in Chart   TSH     Status: None   Collection Time: 09/27/17  8:56 AM  Result Value Ref Range   TSH 1.206 0.350 - 4.500 uIU/mL    Comment: Performed by a 3rd Generation assay with a functional sensitivity of <=0.01 uIU/mL.  T4, free     Status: None   Collection Time: 09/27/17  8:56 AM  Result Value Ref Range   Free T4 0.98 0.61 - 1.12 ng/dL    Comment: (NOTE) Biotin ingestion may interfere with free T4 tests. If the results are inconsistent with the TSH level, previous test results, or the clinical presentation, then consider biotin interference. If needed, order repeat testing after stopping biotin. Performed at [facility], [address] [place], [place] [address]   Culture, sputum-assessment     Status: None   Collection Time: 09/27/17  9:37 AM  Result Value Ref Range   Specimen Description SPUTUM    Special Requests NONE    Sputum evaluation THIS SPECIMEN IS ACCEPTABLE FOR SPUTUM CULTURE    Report Status 09/27/2017 FINAL   Culture, respiratory (NON-Expectorated)     Status: None (Preliminary result)   Collection Time: 09/27/17  9:37 AM  Result Value Ref Range   Specimen Description SPUTUM    Special Requests NONE Reflexed from [id number]    Gram Stain      RARE WBC  PRESENT, PREDOMINANTLY PMN FEW SQUAMOUS EPITHELIAL CELLS PRESENT FEW YEAST   WITH PSEUDOHYPHAE    Culture      CULTURE REINCUBATED FOR BETTER GROWTH Performed at [facility], [address] [place],  [address]    Report Status PENDING   Glucose, capillary     Status: Abnormal   Collection Time: 09/27/17 12:26 PM  Result Value Ref Range   Glucose-Capillary 193 (H) 65 - 99 mg/dL   Comment 1 Notify RN    Comment 2 Document in Chart   Creatinine, serum     Status: None   Collection Time: 09/28/17  6:02 AM  Result Value Ref Range   Creatinine, Ser 0.72 0.44 - 1.00 mg/dL   GFR calc non Af Amer >60 >60 mL/min   GFR calc Af Amer >60 >60 mL/min    Comment: (NOTE) The eGFR has been calculated using the CKD EPI equation. This calculation has not been validated in all clinical situations. eGFR's persistently <60 mL/min signify possible Chronic Kidney Disease.   Glucose, capillary     Status: None   Collection Time: 09/28/17  8:31 AM  Result Value Ref Range   Glucose-Capillary 92 65 - 99 mg/dL  Glucose, capillary     Status: Abnormal   Collection Time: 09/28/17 12:14 PM  Result Value Ref Range   Glucose-Capillary 170 (H) 65 - 99 mg/dL    ECG   N/A  Telemetry   Sinus rhythm with sinus bradycardia, occasional PVC's - Personally Reviewed  Radiology    No results found.  Cardiac Studies   LV EF: 55% -   60%  ------------------------------------------------------------------- Indications:      Dyspnea 786.09.  ------------------------------------------------------------------- History:   PMH:   Chronic obstructive pulmonary disease.  Risk factors:  Current tobacco use. Diabetes mellitus. Obese. Dyslipidemia.  ------------------------------------------------------------------- Study Conclusions  - Left ventricle: The cavity size was normal. Wall thickness was   normal. Systolic function was normal. The estimated ejection   fraction was in the range of  55% to 60%. Wall motion was normal;   there were no regional wall motion abnormalities. Doppler   parameters are consistent with abnormal left ventricular   relaxation (grade 1 diastolic dysfunction). The E/e&' ratio is   between 8-15, suggesting indeterminate LV filling pressure. - Mitral valve: Mildly thickened leaflets . There was trivial   regurgitation. - Left atrium: The atrium was normal in size. - Right atrium: The atrium was mildly dilated. - Atrial septum: Possible small mobile mass on the left atrial side   of the septum - could be artifact, myxoma or possibly septal   aneurysm, not well visualized and not seen in the 4 chamber view   - consider repeat outpatient TTE or TEE to further evaluate. - Inferior vena cava: The vessel was dilated. The respirophasic   diameter changes were blunted (< 50%), consistent with elevated   central venous pressure.  Impressions:  - LVEF 55-60%, normal wall thickness, normal wall motion, grade 1   DD, indeterminate LV filling pressure, trivial MR, normal LA   size, mild RAE, possible small mobile mass adherent to the atrial   septum in the LA - consider further imaging if clinically   indicated., dilated IVC.  Assessment   1. Principal Problem: 2.   COPD with acute exacerbation (HCC) 3. Active Problems: 4.   Generalized weakness 5.   Hyperlipidemia 6.   Diabetes mellitus type 2 in obese (HCC) 7.   Sepsis (HCC) 8.   Diarrhea 9.   Toe fracture, left 10.   GERD (gastroesophageal   reflux disease) 11.   Iron deficiency 12.   Tobacco abuse 13.   Abdominal pain 14.   Plan   1. Avoid AVN blockers - pursue outpatient therapy for sleep apnea. Small mass seen on echo in the subcostal view - may be artifact, but cannot exclude myxoma - consider outpatient repeat echo or TEE for definitive evaluation.   No further suggestions at this time. Cardiology will sign-off. Call with questions.  Time Spent Directly with Patient:  I have spent a  total of 15 minutes with the patient reviewing hospital notes, telemetry, EKGs, labs and examining the patient as well as establishing an assessment and plan that was discussed personally with the patient. > 50% of time was spent in direct patient care.  Length of Stay:  LOS: 4 days   Kenneth C. Hilty, MD, FACC, FACP  [facility]  Medical Director of the [facility] Attending Cardiologist  Direct Dial: [phone]  Fax: [phone]  Website:  [email or web address]  Kenneth C Hilty 09/28/2017, 1:45 PM   

## 2017-09-29 LAB — BASIC METABOLIC PANEL
ANION GAP: 6 (ref 5–15)
BUN: 19 mg/dL (ref 6–20)
CALCIUM: 8.7 mg/dL — AB (ref 8.9–10.3)
CHLORIDE: 103 mmol/L (ref 101–111)
CO2: 34 mmol/L — ABNORMAL HIGH (ref 22–32)
CREATININE: 0.62 mg/dL (ref 0.44–1.00)
GFR calc non Af Amer: >60 mL/min (ref >60)
Glucose, Bld: 150 mg/dL — ABNORMAL HIGH (ref 65–99)
Potassium: 4.2 mmol/L (ref 3.5–5.1)
SODIUM: 143 mmol/L (ref 135–145)

## 2017-09-29 LAB — GLUCOSE, CAPILLARY
GLUCOSE-CAPILLARY: 210 mg/dL — AB (ref 65–99)
Glucose-Capillary: 198 mg/dL — ABNORMAL HIGH (ref 65–99)
Glucose-Capillary: 209 mg/dL — ABNORMAL HIGH (ref 65–99)
Glucose-Capillary: 246 mg/dL — ABNORMAL HIGH (ref 65–99)

## 2017-09-29 LAB — CBC
HCT: 35.1 % — ABNORMAL LOW (ref 36.0–46.0)
HEMOGLOBIN: 10.6 g/dL — AB (ref 12.0–15.0)
MCH: 24.3 pg — ABNORMAL LOW (ref 26.0–34.0)
MCHC: 30.2 g/dL (ref 30.0–36.0)
MCV: 80.5 fL (ref 78.0–100.0)
Platelets: 229 10*3/uL (ref 150–400)
RBC: 4.36 MIL/uL (ref 3.87–5.11)
RDW: 17.7 % — ABNORMAL HIGH (ref 11.5–15.5)
WBC: 20.9 10*3/uL — AB (ref 4.0–10.5)

## 2017-09-29 LAB — CULTURE, BLOOD (ROUTINE X 2)
Culture: NO GROWTH
Culture: NO GROWTH

## 2017-09-29 LAB — CULTURE, RESPIRATORY

## 2017-09-29 LAB — CULTURE, RESPIRATORY W GRAM STAIN: Culture: NORMAL

## 2017-09-29 MED ORDER — ALBUTEROL SULFATE (2.5 MG/3ML) 0.083% IN NEBU: 2.5000 mg | INHALATION_SOLUTION | RESPIRATORY_TRACT | Status: DC | PRN

## 2017-09-29 MED ORDER — FUROSEMIDE 10 MG/ML IJ SOLN
40.0000 mg | Freq: Two times a day (BID) | INTRAMUSCULAR | Status: DC
  Administered 2017-09-29 – 2017-09-30 (×3): 40 mg via INTRAVENOUS
  Filled 2017-09-29 (×3): qty 4

## 2017-09-29 NOTE — Progress Notes (Signed)
PT Cancellation Note  Patient Details Name: Kelli Rivera MRN: [id number] DOB: September 13, 1951   Cancelled Treatment:    Reason Eval/Treat Not Completed: Patient declined, no reason specified   Rebeca Alert, MPT Pager: [phone]

## 2017-09-29 NOTE — Progress Notes (Signed)
PROGRESS NOTE        PATIENT DETAILS Name: Kelli Rivera Age: 66 y.o. Sex: female Date of Birth: 03-04-1951 Admit Date: 09/24/2017 Admitting Physician Lorretta Harp, MD TIR:[id number], Genia Del, MD  Brief Narrative: Patient is a 66 y.o. female with history of COPD, chronic hypoxemic respiratory failure on home O2, obesity-admitted to the hospitalist service for COPD exacerbation. See below for further details  Subjective: Only minimally better than yesterday-continues to cough and continues to have significant wheezing.  Assessment/Plan: Acute and chronic hypoxemic respiratory failure secondary to COPD exacerbation: Only minimally improved compared to yesterday-still wheezing and short of breath when she ambulates, not yet back to her baseline. Continue steroids, bronchodilators, Mucinex, incentive spirometry/flutter valve. Continue anti-sinus therapy with Claritin/Flonase and Afrin. Encouraged to ambulate as much as possible, we will continue to follow closely.  Insulin-dependent type 2 diabetes: CBGs relatively stable-expect some amount of hyperglycemia due to steroids-continue Lantus 50 units and SSI. Follow and adjust accordingly.   Sinus bradycardia: This is mostly nocturnal bradycardia-likely related to undiagnosed sleep apnea, cardiology following. Appreciate input.  Probable OSA: Apparently patient is scheduled for an outpatient polysomnography, have encouraged her to keep this upcoming appointment after discharge. Per patient and family-they are scheduled for outpatient polysomnography. Having encouraged family/patient to keep this appointment.  Chronic lower extremity edema: Continue furosemide  GERD: Continue PPI   Left fifth toe fracture: Continue supportive care-we'll need to discuss with orthopedics prior to discharge.  DVT Prophylaxis: Prophylactic Lovenox   Code Status: Full code   Family Communication: Daughter at bedside  Disposition  Plan: Remain inpatient-home in the next 1-2 days if clinical improvement continues.  Antimicrobial agents: Anti-infectives (From admission, onward)   Start     Dose/Rate Route Frequency Ordered Stop   09/26/17 1200  levofloxacin (LEVAQUIN) tablet 500 mg  Status:  Discontinued     500 mg Oral Daily 09/26/17 0939 09/26/17 1048   09/26/17 1200  amoxicillin-clavulanate (AUGMENTIN) 875-125 MG per tablet 1 tablet  Status:  Discontinued     1 tablet Oral Every 12 hours 09/26/17 1048 09/28/17 1222   09/25/17 1800  vancomycin (VANCOCIN) IVPB 750 mg/150 ml premix  Status:  Discontinued     750 mg 150 mL/hr over 60 Minutes Intravenous Every 12 hours 09/25/17 1639 09/26/17 0940   09/25/17 0500  vancomycin (VANCOCIN) IVPB 1000 mg/200 mL premix  Status:  Discontinued     1,000 mg 200 mL/hr over 60 Minutes Intravenous Every 12 hours 09/24/17 1427 09/25/17 1639   09/24/17 2100  piperacillin-tazobactam (ZOSYN) IVPB 3.375 g  Status:  Discontinued     3.375 g 12.5 mL/hr over 240 Minutes Intravenous Every 8 hours 09/24/17 1427 09/26/17 0940   09/24/17 1645  oseltamivir (TAMIFLU) capsule 75 mg     75 mg Oral  Once 09/24/17 1632 09/24/17 1723   09/24/17 1530  vancomycin (VANCOCIN) IVPB 1000 mg/200 mL premix     1,000 mg 200 mL/hr over 60 Minutes Intravenous  Once 09/24/17 1423 09/24/17 1650   09/24/17 1415  piperacillin-tazobactam (ZOSYN) IVPB 3.375 g     3.375 g 100 mL/hr over 30 Minutes Intravenous  Once 09/24/17 1411 09/24/17 1535   09/24/17 1415  vancomycin (VANCOCIN) IVPB 1000 mg/200 mL premix     1,000 mg 200 mL/hr over 60 Minutes Intravenous  Once 09/24/17 1411 09/24/17 1753  Procedures: Echo 12/4>>pending  CONSULTS:  cardiology  Time spent: 25- minutes-Greater than 50% of this time was spent in counseling, explanation of diagnosis, planning of further management, and coordination of care.  MEDICATIONS: Scheduled Meds: . aspirin EC  81 mg Oral Daily  . budesonide (PULMICORT)  nebulizer solution  0.25 mg Nebulization BID  . Chlorhexidine Gluconate Cloth  6 each Topical Q0600  . enoxaparin (LOVENOX) injection  60 mg Subcutaneous Q24H  . fluticasone  2 spray Each Nare Daily  . furosemide  40 mg Intravenous Daily  . guaiFENesin  1,200 mg Oral BID  . insulin aspart  0-9 Units Subcutaneous TID WC  . insulin glargine  50 Units Subcutaneous QHS  . ipratropium-albuterol  3 mL Nebulization Q6H  . loratadine  10 mg Oral Daily  . methylPREDNISolone (SOLU-MEDROL) injection  40 mg Intravenous Q8H  . mirabegron ER  50 mg Oral Daily  . montelukast  10 mg Oral QHS  . multivitamin with minerals   Oral Daily  . mupirocin ointment  1 application Nasal BID  . oxymetazoline  1 spray Each Nare BID  . pantoprazole  40 mg Oral Daily  . tiZANidine  4 mg Oral TID  . traMADol  50 mg Oral TID   Continuous Infusions: PRN Meds:.acetaminophen, albuterol, morphine injection, ondansetron   PHYSICAL EXAM: Vital signs: Vitals:   09/28/17 2110 09/29/17 0132 09/29/17 0604 09/29/17 0811  BP:   (!) 143/54   Pulse:   62   Resp:   18   Temp:   98.1 F (36.7 C)   TempSrc:   Oral   SpO2: 97% 99% 98% 99%  Weight:      Height:       Filed Weights   09/24/17 2200 09/28/17 0052  Weight: 126.1 kg (278 lb) 127.2 kg (280 lb 6.8 oz)   Body mass index is 48.13 kg/m.  General appearance :Awake, alert, not in any distress.  Eyes:, pupils equally reactive to light and accomodation,no scleral icterus. HEENT: Atraumatic and Normocephalic Neck: supple, no JVD. Resp:Good air entry bilaterally, coarse rhonchi CVS: S1 S2 regular, no murmurs.  GI: Bowel sounds present, Non tender and not distended with no gaurding, rigidity or rebound. Extremities: B/L Lower Ext shows ++ edema, both legs are warm to touch Neurology:  speech clear,Non focal, sensation is grossly intact. Psychiatric: Normal judgment and insight. Normal mood. Musculoskeletal:No digital cyanosis Skin:No Rash, warm and  dry Wounds:N/A  I have personally reviewed following labs and imaging studies  LABORATORY DATA: CBC: Recent Labs  Lab 09/24/17 1152 09/25/17 0806 09/27/17 0340 09/29/17 0433  WBC 23.1* 21.2* 24.7* 20.9*  HGB 11.9* 11.4* 11.1* 10.6*  HCT 38.9 36.7 37.4 35.1*  MCV 79.9 81.9 81.1 80.5  PLT 220 179 204 229    Basic Metabolic Panel: Recent Labs  Lab 09/24/17 1152 09/25/17 1452 09/26/17 0340 09/27/17 0340 09/28/17 0602 09/29/17 0433  NA 139  --   --  139  --  143  K 3.7  --   --  4.3  --  4.2  CL 107  --   --  105  --  103  CO2 27  --   --  28  --  34*  GLUCOSE 136*  --   --  202*  --  150*  BUN 8  --   --  16  --  19  CREATININE 0.83 0.76 0.71 0.68 0.72 0.62  CALCIUM 7.9*  --   --  8.5*  --  8.7*    GFR: Estimated Creatinine Clearance: 90.2 mL/min (by C-G formula based on SCr of 0.62 mg/dL).  Liver Function Tests: Recent Labs  Lab 09/24/17 1152 09/27/17 0340  AST 10* 16  ALT 10* 13*  ALKPHOS 82 80  BILITOT 0.4 0.4  PROT 5.5* 5.8*  ALBUMIN 2.5* 2.5*   Recent Labs  Lab 09/24/17 1152  LIPASE 22   No results for input(s): AMMONIA in the last 168 hours.  Coagulation Profile: No results for input(s): INR, PROTIME in the last 168 hours.  Cardiac Enzymes: No results for input(s): CKTOTAL, CKMB, CKMBINDEX, TROPONINI in the last 168 hours.  BNP (last 3 results) No results for input(s): PROBNP in the last 8760 hours.  HbA1C: No results for input(s): HGBA1C in the last 72 hours.  CBG: Recent Labs  Lab 09/28/17 0831 09/28/17 1214 09/28/17 1616 09/28/17 2029 09/29/17 0746  GLUCAP 92 170* 212* 202* 210*    Lipid Profile: No results for input(s): CHOL, HDL, LDLCALC, TRIG, CHOLHDL, LDLDIRECT in the last 72 hours.  Thyroid Function Tests: Recent Labs    09/27/17 0856  TSH 1.206  FREET4 0.98    Anemia Panel: No results for input(s): VITAMINB12, FOLATE, FERRITIN, TIBC, IRON, RETICCTPCT in the last 72 hours.  Urine analysis:    Component Value  Date/Time   COLORURINE YELLOW 09/24/2017 1412   APPEARANCEUR CLEAR 09/24/2017 1412   LABSPEC >1.030 (H) 09/24/2017 1412   PHURINE 5.5 09/24/2017 1412   GLUCOSEU NEGATIVE 09/24/2017 1412   HGBUR NEGATIVE 09/24/2017 1412   BILIRUBINUR SMALL (A) 09/24/2017 1412   KETONESUR NEGATIVE 09/24/2017 1412   PROTEINUR NEGATIVE 09/24/2017 1412   NITRITE NEGATIVE 09/24/2017 1412   LEUKOCYTESUR NEGATIVE 09/24/2017 1412    Sepsis Labs: Lactic Acid, Venous    Component Value Date/Time   LATICACIDVEN 0.8 09/25/2017 0806    MICROBIOLOGY: Recent Results (from the past 240 hour(s))  Blood Culture (routine x 2)     Status: None (Preliminary result)   Collection Time: 09/24/17  2:35 PM  Result Value Ref Range Status   Specimen Description BLOOD LEFT FOREARM  Final   Special Requests   Final    BOTTLES DRAWN AEROBIC AND ANAEROBIC Blood Culture results may not be optimal due to an excessive volume of blood received in culture bottles   Culture   Final    NO GROWTH 4 DAYS Performed at [facility], [address] [place], [place] [address]    Report Status PENDING  Incomplete  Blood Culture (routine x 2)     Status: None (Preliminary result)   Collection Time: 09/24/17  2:35 PM  Result Value Ref Range Status   Specimen Description BLOOD RIGHT HAND  Final   Special Requests   Final    BOTTLES DRAWN AEROBIC AND ANAEROBIC Blood Culture results may not be optimal due to an inadequate volume of blood received in culture bottles   Culture   Final    NO GROWTH 4 DAYS Performed at [facility], [address], [place], [place] [address]    Report Status PENDING  Incomplete  MRSA PCR Screening     Status: Abnormal   Collection Time: 09/24/17  9:37 PM  Result Value Ref Range Status   MRSA by PCR POSITIVE (A) NEGATIVE Final    Comment:        The GeneXpert MRSA Assay (FDA approved for NASAL specimens only), is one component of a comprehensive MRSA colonization surveillance program. It  is not  intended to diagnose MRSA infection nor to guide or monitor treatment for MRSA infections. RESULT CALLED TO, READ BACK BY AND VERIFIED WITH: R MCINTOSH AT 0333 ON 12.02.2018 BY NBROOKS   Respiratory Panel by PCR     Status: None   Collection Time: 09/25/17  2:40 AM  Result Value Ref Range Status   Adenovirus NOT DETECTED NOT DETECTED Final   Coronavirus 229E NOT DETECTED NOT DETECTED Final   Coronavirus HKU1 NOT DETECTED NOT DETECTED Final   Coronavirus NL63 NOT DETECTED NOT DETECTED Final   Coronavirus OC43 NOT DETECTED NOT DETECTED Final   Metapneumovirus NOT DETECTED NOT DETECTED Final   Rhinovirus / Enterovirus NOT DETECTED NOT DETECTED Final   Influenza A NOT DETECTED NOT DETECTED Final   Influenza B NOT DETECTED NOT DETECTED Final   Parainfluenza Virus 1 NOT DETECTED NOT DETECTED Final   Parainfluenza Virus 2 NOT DETECTED NOT DETECTED Final   Parainfluenza Virus 3 NOT DETECTED NOT DETECTED Final   Parainfluenza Virus 4 NOT DETECTED NOT DETECTED Final   Respiratory Syncytial Virus NOT DETECTED NOT DETECTED Final   Bordetella pertussis NOT DETECTED NOT DETECTED Final   Chlamydophila pneumoniae NOT DETECTED NOT DETECTED Final   Mycoplasma pneumoniae NOT DETECTED NOT DETECTED Final    Comment: Performed at [facility], [address], [place], [place] [address]  Culture, sputum-assessment     Status: None   Collection Time: 09/27/17  9:37 AM  Result Value Ref Range Status   Specimen Description SPUTUM  Final   Special Requests NONE  Final   Sputum evaluation THIS SPECIMEN IS ACCEPTABLE FOR SPUTUM CULTURE  Final   Report Status 09/27/2017 FINAL  Final  Culture, respiratory (NON-Expectorated)     Status: None (Preliminary result)   Collection Time: 09/27/17  9:37 AM  Result Value Ref Range Status   Specimen Description SPUTUM  Final   Special Requests NONE Reflexed from [id number]  Final   Gram Stain   Final    RARE WBC PRESENT, PREDOMINANTLY PMN FEW SQUAMOUS  EPITHELIAL CELLS PRESENT FEW YEAST WITH PSEUDOHYPHAE    Culture   Final    CULTURE REINCUBATED FOR BETTER GROWTH Performed at [facility], [address] [place], [place] [address]    Report Status PENDING  Incomplete    RADIOLOGY STUDIES/RESULTS: Dg Chest 2 View  Result Date: 09/24/2017 CLINICAL DATA:  Cough, congestion EXAM: CHEST  2 VIEW COMPARISON:  09/22/2016 FINDINGS: Heart is borderline in size. Lingular scarring. Mild vascular congestion. No overt edema or acute opacities. No effusions or acute bony abnormality. IMPRESSION: Borderline heart size.  Mild vascular congestion. Lingular scarring. Electronically Signed   By: Charlett Nose M.D.   On: 09/24/2017 12:37   Ct Chest W Contrast  Result Date: 09/24/2017 CLINICAL DATA:  Diabetic patient with generalized weakness. Nausea and vomiting. EXAM: CT CHEST, ABDOMEN, AND PELVIS WITH CONTRAST TECHNIQUE: Multidetector CT imaging of the chest, abdomen and pelvis was performed following the standard protocol during bolus administration of intravenous contrast. CONTRAST:  ISOVUE-300 IOPAMIDOL (ISOVUE-300) INJECTION 61% COMPARISON:  Abdomen CT April 26, 2015 FINDINGS: CT CHEST FINDINGS Cardiovascular: The heart size is borderline. No obvious coronary artery calcifications. The main pulmonary artery measures 3.6 cm. No filling defects in the pulmonary arteries on limited images. The thoracic aorta is normal in caliber. Taking motion artifact into account, no dissection. There is mild atherosclerosis in the arch. A superior aortic recess is identified with fluid adjacent to the aorta, extending inferiorly. The  underlying aorta is normal in appearance. Mediastinum/Nodes: Tiny nodules in the thyroid of doubtful significance. The esophagus is normal in appearance. No adenopathy. No effusions. Lungs/Pleura: Central airways are normal. No pneumothorax. Scattered atelectasis. No pulmonary nodules, masses, or focal infiltrates. Musculoskeletal: See  below. CT ABDOMEN PELVIS FINDINGS Hepatobiliary: The patient is status post hepatic steatosis. No biliary duct dilatation. Limited views of the pulmonary arteries are normal with opacification. No liver masses are seen. Pancreas: Unremarkable. No pancreatic ductal dilatation or surrounding inflammatory changes. Spleen: Normal in size without focal abnormality. Adrenals/Urinary Tract: There is a probable cyst in the lower left kidney, too small to characterize. No hydronephrosis, suspicious mass, or stone. No ureterectasis or ureteral stone. The bladder is normal. Stomach/Bowel: The stomach and small bowel are normal. A few scattered colonic diverticuli are identified without diverticulitis. The appendix is not seen but there is no evidence of appendicitis. By report, the patient is status post appendectomy. Vascular/Lymphatic: The abdominal aorta is normal in caliber with no dissection. No adenopathy. Reproductive: Status post hysterectomy. No adnexal masses. Other: No abdominal wall hernia or abnormality. No abdominopelvic ascites. Musculoskeletal: No acute or significant osseous findings. IMPRESSION: 1. No acute abnormalities identified to explain the patient's symptoms. 2. The main pulmonary artery measures 3.6 cm raising the possibility of pulmonary arterial hypertension. 3. 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 Aortic Atherosclerosis (ICD10-I70.0). Electronically Signed   By: Gerome Sam III M.D   On: 09/24/2017 15:51   Ct Abdomen Pelvis W Contrast  Result Date: 09/24/2017 CLINICAL DATA:  Diabetic patient with generalized weakness. Nausea and vomiting. EXAM: CT CHEST, ABDOMEN, AND PELVIS WITH CONTRAST TECHNIQUE: Multidetector CT imaging of the chest, abdomen and pelvis was performed following the standard protocol during bolus administration of intravenous contrast. CONTRAST:  ISOVUE-300 IOPAMIDOL (ISOVUE-300) INJECTION 61% COMPARISON:  Abdomen CT April 26, 2015 FINDINGS: CT CHEST FINDINGS  Cardiovascular: The heart size is borderline. No obvious coronary artery calcifications. The main pulmonary artery measures 3.6 cm. No filling defects in the pulmonary arteries on limited images. The thoracic aorta is normal in caliber. Taking motion artifact into account, no dissection. There is mild atherosclerosis in the arch. A superior aortic recess is identified with fluid adjacent to the aorta, extending inferiorly. The underlying aorta is normal in appearance. Mediastinum/Nodes: Tiny nodules in the thyroid of doubtful significance. The esophagus is normal in appearance. No adenopathy. No effusions. Lungs/Pleura: Central airways are normal. No pneumothorax. Scattered atelectasis. No pulmonary nodules, masses, or focal infiltrates. Musculoskeletal: See below. CT ABDOMEN PELVIS FINDINGS Hepatobiliary: The patient is status post hepatic steatosis. No biliary duct dilatation. Limited views of the pulmonary arteries are normal with opacification. No liver masses are seen. Pancreas: Unremarkable. No pancreatic ductal dilatation or surrounding inflammatory changes. Spleen: Normal in size without focal abnormality. Adrenals/Urinary Tract: There is a probable cyst in the lower left kidney, too small to characterize. No hydronephrosis, suspicious mass, or stone. No ureterectasis or ureteral stone. The bladder is normal. Stomach/Bowel: The stomach and small bowel are normal. A few scattered colonic diverticuli are identified without diverticulitis. The appendix is not seen but there is no evidence of appendicitis. By report, the patient is status post appendectomy. Vascular/Lymphatic: The abdominal aorta is normal in caliber with no dissection. No adenopathy. Reproductive: Status post hysterectomy. No adnexal masses. Other: No abdominal wall hernia or abnormality. No abdominopelvic ascites. Musculoskeletal: No acute or significant osseous findings. IMPRESSION: 1. No acute abnormalities identified to explain the  patient's symptoms. 2. The main pulmonary artery measures 3.6  cm raising the possibility of pulmonary arterial hypertension. 3. 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 Aortic Atherosclerosis (ICD10-I70.0). Electronically Signed   By: Gerome Samavid  Williams III M.D   On: 09/24/2017 15:51   Dg Toe 5th Left  Result Date: 09/24/2017 CLINICAL DATA:  Hit left fifth toe yesterday.  Pain.  Bruising EXAM: DG TOE 5TH LEFT COMPARISON:  None. FINDINGS: Lucency at the base of the proximal phalanx of the left little toe compatible with fracture. No subluxation or dislocation. IMPRESSION: Minimally displaced fracture at the base of the proximal phalanx left little toe. Electronically Signed   By: Charlett NoseKevin  Dover M.D.   On: 09/24/2017 12:54     LOS: 5 days   Jeoffrey MassedShanker Hennessey Cantrell, MD  [facility] Pager:[phone]  If 7PM-7AM, please contact night-coverage [email or web address] Password [email or web address] 09/29/2017, 9:54 AM

## 2017-09-29 NOTE — Progress Notes (Signed)
PT Cancellation Note  Patient Details Name: Kelli Rivera MRN: [id number] DOB: 08-11-1951   Cancelled Treatment:    Reason Eval/Treat Not Completed: Attempted PT tx session this am-pt declined to participate at that time. Will check back as schedule allows.    Rebeca Alert, MPT Pager: [phone]

## 2017-09-30 LAB — CBC
HCT: 37 % (ref 36.0–46.0)
HEMOGLOBIN: 11.3 g/dL — AB (ref 12.0–15.0)
MCH: 24.2 pg — AB (ref 26.0–34.0)
MCHC: 30.5 g/dL (ref 30.0–36.0)
MCV: 79.2 fL (ref 78.0–100.0)
Platelets: 257 10*3/uL (ref 150–400)
RBC: 4.67 MIL/uL (ref 3.87–5.11)
RDW: 17.6 % — ABNORMAL HIGH (ref 11.5–15.5)
WBC: 23.6 10*3/uL — AB (ref 4.0–10.5)

## 2017-09-30 LAB — BASIC METABOLIC PANEL
ANION GAP: 11 (ref 5–15)
BUN: 21 mg/dL — ABNORMAL HIGH (ref 6–20)
CHLORIDE: 96 mmol/L — AB (ref 101–111)
CO2: 33 mmol/L — ABNORMAL HIGH (ref 22–32)
Calcium: 8.8 mg/dL — ABNORMAL LOW (ref 8.9–10.3)
Creatinine, Ser: 0.67 mg/dL (ref 0.44–1.00)
Glucose, Bld: 146 mg/dL — ABNORMAL HIGH (ref 65–99)
POTASSIUM: 4.5 mmol/L (ref 3.5–5.1)
SODIUM: 140 mmol/L (ref 135–145)

## 2017-09-30 LAB — GLUCOSE, CAPILLARY
GLUCOSE-CAPILLARY: 144 mg/dL — AB (ref 65–99)
GLUCOSE-CAPILLARY: 228 mg/dL — AB (ref 65–99)
GLUCOSE-CAPILLARY: 220 mg/dL — AB (ref 65–99)
GLUCOSE-CAPILLARY: 280 mg/dL — AB (ref 65–99)

## 2017-09-30 MED ORDER — BENZONATATE 100 MG PO CAPS: 200.0000 mg | ORAL_CAPSULE | Freq: Three times a day (TID) | ORAL | Status: DC | PRN

## 2017-09-30 MED ORDER — HYDROCOD POLST-CPM POLST ER 10-8 MG/5ML PO SUER: 5.0000 mL | Freq: Two times a day (BID) | ORAL | Status: DC | PRN

## 2017-09-30 MED ORDER — ARFORMOTEROL TARTRATE 15 MCG/2ML IN NEBU
15.0000 ug | INHALATION_SOLUTION | Freq: Two times a day (BID) | RESPIRATORY_TRACT | Status: DC
  Administered 2017-09-30 – 2017-10-01 (×2): 15 ug via RESPIRATORY_TRACT
  Filled 2017-09-30 (×2): qty 2

## 2017-09-30 MED ORDER — PREDNISONE 20 MG PO TABS
40.0000 mg | ORAL_TABLET | Freq: Every day | ORAL | Status: DC
  Administered 2017-10-01: 40 mg via ORAL

## 2017-09-30 MED ORDER — IPRATROPIUM-ALBUTEROL 0.5-2.5 (3) MG/3ML IN SOLN
3.0000 mL | Freq: Three times a day (TID) | RESPIRATORY_TRACT | Status: DC
  Administered 2017-09-30 – 2017-10-01 (×4): 3 mL via RESPIRATORY_TRACT
  Filled 2017-09-30 (×4): qty 3

## 2017-09-30 NOTE — Progress Notes (Signed)
PROGRESS NOTE        PATIENT DETAILS Name: Kelli Rivera Age: 66 y.o. Sex: female Date of Birth: May 31, 1951 Admit Date: 09/24/2017 Admitting Physician Lorretta Harp, MD [id number], Genia Del, MD  Brief Narrative: Patient is a 66 y.o. female with history of COPD, chronic hypoxemic respiratory failure on home O2, obesity-admitted to the hospitalist service for COPD exacerbation. See below for further details  Subjective: Still continues to cough-claims she feels slightly less tight and better compared to the past few days.  Has not ambulated yet with physical therapy.  Assessment/Plan: Acute and chronic hypoxemic respiratory failure secondary to COPD exacerbation: Slow improvement continues, still wheezing but not as bad as the past few days.  States her main issue still continues to be cough.  Taper steroids, continue bronchodilators, Mucinex, incentive spirometry/flutter valve. Continue anti-sinus therapy with Claritin/Flonase and Afrin. Encouraged to ambulate as much as possible, hopefully home soon   Insulin-dependent type 2 diabetes: CBGs relatively stable-some amount of mild hyperglycemia expected with steroids-these are now being tapered-so this should improve with time.  Continue Lantus and SSI, and continue to follow CBG trend.  Will adjust accordingly.    Sinus bradycardia: Nocturnal bradycardia-likely related to undiagnosed sleep apnea-she is asymptomatic-cardiology briefly consulted-appreciate input, plans are for her to keep her outpatient polysomnography appointment.  Should improve after she starts using CPAP.    Probable OSA: Apparently patient is scheduled for an outpatient polysomnography, I have encouraged her to keep this appointment.  Chronic lower extremity edema: Improving-weight decreased-continue furosemide.  F  GERD: Continue PPI  Left fifth toe fracture: Continue supportive care-we'll need to discuss with orthopedics prior to discharge.     Morbid obesity  DVT Prophylaxis: Prophylactic Lovenox   Code Status: Full code   Family Communication: Daughter at bedside  Disposition Plan: Remain inpatient-hopefully home in the next day or so if clinical improvement continues  Antimicrobial agents: Anti-infectives (From admission, onward)   Start     Dose/Rate Route Frequency Ordered Stop   09/26/17 1200  levofloxacin (LEVAQUIN) tablet 500 mg  Status:  Discontinued     500 mg Oral Daily 09/26/17 0939 09/26/17 1048   09/26/17 1200  amoxicillin-clavulanate (AUGMENTIN) 875-125 MG per tablet 1 tablet  Status:  Discontinued     1 tablet Oral Every 12 hours 09/26/17 1048 09/28/17 1222   09/25/17 1800  vancomycin (VANCOCIN) IVPB 750 mg/150 ml premix  Status:  Discontinued     750 mg 150 mL/hr over 60 Minutes Intravenous Every 12 hours 09/25/17 1639 09/26/17 0940   09/25/17 0500  vancomycin (VANCOCIN) IVPB 1000 mg/200 mL premix  Status:  Discontinued     1,000 mg 200 mL/hr over 60 Minutes Intravenous Every 12 hours 09/24/17 1427 09/25/17 1639   09/24/17 2100  piperacillin-tazobactam (ZOSYN) IVPB 3.375 g  Status:  Discontinued     3.375 g 12.5 mL/hr over 240 Minutes Intravenous Every 8 hours 09/24/17 1427 09/26/17 0940   09/24/17 1645  oseltamivir (TAMIFLU) capsule 75 mg     75 mg Oral  Once 09/24/17 1632 09/24/17 1723   09/24/17 1530  vancomycin (VANCOCIN) IVPB 1000 mg/200 mL premix     1,000 mg 200 mL/hr over 60 Minutes Intravenous  Once 09/24/17 1423 09/24/17 1650   09/24/17 1415  piperacillin-tazobactam (ZOSYN) IVPB 3.375 g     3.375 g 100 mL/hr over 30 Minutes  Intravenous  Once 09/24/17 1411 09/24/17 1535   09/24/17 1415  vancomycin (VANCOCIN) IVPB 1000 mg/200 mL premix     1,000 mg 200 mL/hr over 60 Minutes Intravenous  Once 09/24/17 1411 09/24/17 1753      Procedures: Echo 12/5>> - Left ventricle: The cavity size was normal. Wall thickness was   normal. Systolic function was normal. The estimated ejection    fraction was in the range of 55% to 60%. Wall motion was normal;   there were no regional wall motion abnormalities. Doppler   parameters are consistent with abnormal left ventricular   relaxation (grade 1 diastolic dysfunction). The E/e&' ratio is   between 8-15, suggesting indeterminate LV filling pressure. - Mitral valve: Mildly thickened leaflets . There was trivial   regurgitation. - Left atrium: The atrium was normal in size. - Right atrium: The atrium was mildly dilated. - Atrial septum: Possible small mobile mass on the left atrial side   of the septum - could be artifact, myxoma or possibly septal   aneurysm, not well visualized and not seen in the 4 chamber view   - consider repeat outpatient TTE or TEE to further evaluate. - Inferior vena cava: The vessel was dilated. The respirophasic   diameter changes were blunted (< 50%), consistent with elevated   central venous pressure.  CONSULTS:  cardiology  Time spent: 25 minutes-Greater than 50% of this time was spent in counseling, explanation of diagnosis, planning of further management, and coordination of care.  MEDICATIONS: Scheduled Meds: . aspirin EC  81 mg Oral Daily  . budesonide (PULMICORT) nebulizer solution  0.25 mg Nebulization BID  . enoxaparin (LOVENOX) injection  60 mg Subcutaneous Q24H  . fluticasone  2 spray Each Nare Daily  . furosemide  40 mg Intravenous BID  . guaiFENesin  1,200 mg Oral BID  . insulin aspart  0-9 Units Subcutaneous TID WC  . insulin glargine  50 Units Subcutaneous QHS  . ipratropium-albuterol  3 mL Nebulization TID  . loratadine  10 mg Oral Daily  . methylPREDNISolone (SOLU-MEDROL) injection  40 mg Intravenous Q8H  . mirabegron ER  50 mg Oral Daily  . montelukast  10 mg Oral QHS  . multivitamin with minerals   Oral Daily  . oxymetazoline  1 spray Each Nare BID  . pantoprazole  40 mg Oral Daily  . tiZANidine  4 mg Oral TID  . traMADol  50 mg Oral TID   Continuous Infusions: PRN  Meds:.acetaminophen, albuterol, morphine injection, ondansetron   PHYSICAL EXAM: Vital signs: Vitals:   09/29/17 1955 09/30/17 0443 09/30/17 0500 09/30/17 0807  BP: (!) 125/48 135/60    Pulse: 60 (!) 50    Resp: 18 18    Temp: 97.6 F (36.4 C) 97.6 F (36.4 C)    TempSrc: Axillary Oral    SpO2: 98% 95%  97%  Weight:   120.5 kg (265 lb 10.5 oz)   Height:       Filed Weights   09/24/17 2200 09/28/17 0052 09/30/17 0500  Weight: 126.1 kg (278 lb) 127.2 kg (280 lb 6.8 oz) 120.5 kg (265 lb 10.5 oz)   Body mass index is 45.6 kg/m.  General appearance :Awake, alert, not in any distress.  Eyes:, pupils equally reactive to light and accomodation,no scleral icterus. HEENT: Atraumatic and Normocephalic Neck: supple, no JVD. Resp:Good air entry bilaterally, still with some scattered rhonchi all over CVS: S1 S2 regular, no murmurs.  GI: Bowel sounds present, Non tender and not  distended with no gaurding, rigidity or rebound. Extremities: B/L Lower Ext shows ++ edema, both legs are warm to touch Neurology:  speech clear,Non focal, sensation is grossly intact. Psychiatric: Normal judgment and insight. Normal mood. Musculoskeletal:No digital cyanosis Skin:No Rash, warm and dry Wounds:N/A  I have personally reviewed following labs and imaging studies  LABORATORY DATA: CBC: Recent Labs  Lab 09/24/17 1152 09/25/17 0806 09/27/17 0340 09/29/17 0433 09/30/17 0505  WBC 23.1* 21.2* 24.7* 20.9* 23.6*  HGB 11.9* 11.4* 11.1* 10.6* 11.3*  HCT 38.9 36.7 37.4 35.1* 37.0  MCV 79.9 81.9 81.1 80.5 79.2  PLT 220 179 204 229 257    Basic Metabolic Panel: Recent Labs  Lab 09/24/17 1152  09/26/17 0340 09/27/17 0340 09/28/17 0602 09/29/17 0433 09/30/17 0505  NA 139  --   --  139  --  143 140  K 3.7  --   --  4.3  --  4.2 4.5  CL 107  --   --  105  --  103 96*  CO2 27  --   --  28  --  34* 33*  GLUCOSE 136*  --   --  202*  --  150* 146*  BUN 8  --   --  16  --  19 21*  CREATININE 0.83    < > 0.71 0.68 0.72 0.62 0.67  CALCIUM 7.9*  --   --  8.5*  --  8.7* 8.8*   < > = values in this interval not displayed.    GFR: Estimated Creatinine Clearance: 87.3 mL/min (by C-G formula based on SCr of 0.67 mg/dL).  Liver Function Tests: Recent Labs  Lab 09/24/17 1152 09/27/17 0340  AST 10* 16  ALT 10* 13*  ALKPHOS 82 80  BILITOT 0.4 0.4  PROT 5.5* 5.8*  ALBUMIN 2.5* 2.5*   Recent Labs  Lab 09/24/17 1152  LIPASE 22   No results for input(s): AMMONIA in the last 168 hours.  Coagulation Profile: No results for input(s): INR, PROTIME in the last 168 hours.  Cardiac Enzymes: No results for input(s): CKTOTAL, CKMB, CKMBINDEX, TROPONINI in the last 168 hours.  BNP (last 3 results) No results for input(s): PROBNP in the last 8760 hours.  HbA1C: No results for input(s): HGBA1C in the last 72 hours.  CBG: Recent Labs  Lab 09/29/17 0746 09/29/17 1147 09/29/17 1657 09/29/17 2227 09/30/17 0744  GLUCAP 210* 198* 209* 246* 144*    Lipid Profile: No results for input(s): CHOL, HDL, LDLCALC, TRIG, CHOLHDL, LDLDIRECT in the last 72 hours.  Thyroid Function Tests: No results for input(s): TSH, T4TOTAL, FREET4, T3FREE, THYROIDAB in the last 72 hours.  Anemia Panel: No results for input(s): VITAMINB12, FOLATE, FERRITIN, TIBC, IRON, RETICCTPCT in the last 72 hours.  Urine analysis:    Component Value Date/Time   COLORURINE YELLOW 09/24/2017 1412   APPEARANCEUR CLEAR 09/24/2017 1412   LABSPEC >1.030 (H) 09/24/2017 1412   PHURINE 5.5 09/24/2017 1412   GLUCOSEU NEGATIVE 09/24/2017 1412   HGBUR NEGATIVE 09/24/2017 1412   BILIRUBINUR SMALL (A) 09/24/2017 1412   KETONESUR NEGATIVE 09/24/2017 1412   PROTEINUR NEGATIVE 09/24/2017 1412   NITRITE NEGATIVE 09/24/2017 1412   LEUKOCYTESUR NEGATIVE 09/24/2017 1412    Sepsis Labs: Lactic Acid, Venous    Component Value Date/Time   LATICACIDVEN 0.8 09/25/2017 0806    MICROBIOLOGY: Recent Results (from the past 240  hour(s))  Blood Culture (routine x 2)     Status: None   Collection Time: 09/24/17  2:35 PM  Result Value Ref Range Status   Specimen Description BLOOD LEFT FOREARM  Final   Special Requests   Final    BOTTLES DRAWN AEROBIC AND ANAEROBIC Blood Culture results may not be optimal due to an excessive volume of blood received in culture bottles   Culture   Final    NO GROWTH 5 DAYS Performed at [facility], [address] [place], [place] [address]    Report Status 09/29/2017 FINAL  Final  Blood Culture (routine x 2)     Status: None   Collection Time: 09/24/17  2:35 PM  Result Value Ref Range Status   Specimen Description BLOOD RIGHT HAND  Final   Special Requests   Final    BOTTLES DRAWN AEROBIC AND ANAEROBIC Blood Culture results may not be optimal due to an inadequate volume of blood received in culture bottles   Culture   Final    NO GROWTH 5 DAYS Performed at [facility], [address], [place], [place] [address]    Report Status 09/29/2017 FINAL  Final  MRSA PCR Screening     Status: Abnormal   Collection Time: 09/24/17  9:37 PM  Result Value Ref Range Status   MRSA by PCR POSITIVE (A) NEGATIVE Final    Comment:        The GeneXpert MRSA Assay (FDA approved for NASAL specimens only), is one component of a comprehensive MRSA colonization surveillance program. It is not intended to diagnose MRSA infection nor to guide or monitor treatment for MRSA infections. RESULT CALLED TO, READ BACK BY AND VERIFIED WITH: R MCINTOSH AT 0333 ON 12.02.2018 BY NBROOKS   Respiratory Panel by PCR     Status: None   Collection Time: 09/25/17  2:40 AM  Result Value Ref Range Status   Adenovirus NOT DETECTED NOT DETECTED Final   Coronavirus 229E NOT DETECTED NOT DETECTED Final   Coronavirus HKU1 NOT DETECTED NOT DETECTED Final   Coronavirus NL63 NOT DETECTED NOT DETECTED Final   Coronavirus OC43 NOT DETECTED NOT DETECTED Final   Metapneumovirus NOT DETECTED NOT DETECTED  Final   Rhinovirus / Enterovirus NOT DETECTED NOT DETECTED Final   Influenza A NOT DETECTED NOT DETECTED Final   Influenza B NOT DETECTED NOT DETECTED Final   Parainfluenza Virus 1 NOT DETECTED NOT DETECTED Final   Parainfluenza Virus 2 NOT DETECTED NOT DETECTED Final   Parainfluenza Virus 3 NOT DETECTED NOT DETECTED Final   Parainfluenza Virus 4 NOT DETECTED NOT DETECTED Final   Respiratory Syncytial Virus NOT DETECTED NOT DETECTED Final   Bordetella pertussis NOT DETECTED NOT DETECTED Final   Chlamydophila pneumoniae NOT DETECTED NOT DETECTED Final   Mycoplasma pneumoniae NOT DETECTED NOT DETECTED Final    Comment: Performed at [facility], [address], [place], [place] [address]  Culture, sputum-assessment     Status: None   Collection Time: 09/27/17  9:37 AM  Result Value Ref Range Status   Specimen Description SPUTUM  Final   Special Requests NONE  Final   Sputum evaluation THIS SPECIMEN IS ACCEPTABLE FOR SPUTUM CULTURE  Final   Report Status 09/27/2017 FINAL  Final  Culture, respiratory (NON-Expectorated)     Status: None   Collection Time: 09/27/17  9:37 AM  Result Value Ref Range Status   Specimen Description SPUTUM  Final   Special Requests NONE Reflexed from [id number]  Final   Gram Stain   Final    RARE WBC PRESENT, PREDOMINANTLY  PMN FEW SQUAMOUS EPITHELIAL CELLS PRESENT FEW YEAST WITH PSEUDOHYPHAE    Culture   Final    Consistent with normal respiratory flora. Performed at [facility], [address] [place], [place] [address]    Report Status 09/29/2017 FINAL  Final    RADIOLOGY STUDIES/RESULTS: Dg Chest 2 View  Result Date: 09/24/2017 CLINICAL DATA:  Cough, congestion EXAM: CHEST  2 VIEW COMPARISON:  09/22/2016 FINDINGS: Heart is borderline in size. Lingular scarring. Mild vascular congestion. No overt edema or acute opacities. No effusions or acute bony abnormality. IMPRESSION: Borderline heart size.  Mild vascular congestion. Lingular scarring.  Electronically Signed   By: Charlett NoseKevin  Dover M.D.   On: 09/24/2017 12:37   Ct Chest W Contrast  Result Date: 09/24/2017 CLINICAL DATA:  Diabetic patient with generalized weakness. Nausea and vomiting. EXAM: CT CHEST, ABDOMEN, AND PELVIS WITH CONTRAST TECHNIQUE: Multidetector CT imaging of the chest, abdomen and pelvis was performed following the standard protocol during bolus administration of intravenous contrast. CONTRAST:  100mL ISOVUE-300 IOPAMIDOL (ISOVUE-300) INJECTION 61% COMPARISON:  Abdomen CT April 26, 2015 FINDINGS: CT CHEST FINDINGS Cardiovascular: The heart size is borderline. No obvious coronary artery calcifications. The main pulmonary artery measures 3.6 cm. No filling defects in the pulmonary arteries on limited images. The thoracic aorta is normal in caliber. Taking motion artifact into account, no dissection. There is mild atherosclerosis in the arch. A superior aortic recess is identified with fluid adjacent to the aorta, extending inferiorly. The underlying aorta is normal in appearance. Mediastinum/Nodes: Tiny nodules in the thyroid of doubtful significance. The esophagus is normal in appearance. No adenopathy. No effusions. Lungs/Pleura: Central airways are normal. No pneumothorax. Scattered atelectasis. No pulmonary nodules, masses, or focal infiltrates. Musculoskeletal: See below. CT ABDOMEN PELVIS FINDINGS Hepatobiliary: The patient is status post hepatic steatosis. No biliary duct dilatation. Limited views of the pulmonary arteries are normal with opacification. No liver masses are seen. Pancreas: Unremarkable. No pancreatic ductal dilatation or surrounding inflammatory changes. Spleen: Normal in size without focal abnormality. Adrenals/Urinary Tract: There is a probable cyst in the lower left kidney, too small to characterize. No hydronephrosis, suspicious mass, or stone. No ureterectasis or ureteral stone. The bladder is normal. Stomach/Bowel: The stomach and small bowel are normal. A few  scattered colonic diverticuli are identified without diverticulitis. The appendix is not seen but there is no evidence of appendicitis. By report, the patient is status post appendectomy. Vascular/Lymphatic: The abdominal aorta is normal in caliber with no dissection. No adenopathy. Reproductive: Status post hysterectomy. No adnexal masses. Other: No abdominal wall hernia or abnormality. No abdominopelvic ascites. Musculoskeletal: No acute or significant osseous findings. IMPRESSION: 1. No acute abnormalities identified to explain the patient's symptoms. 2. The main pulmonary artery measures 3.6 cm raising the possibility of pulmonary arterial hypertension. 3. 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 Aortic Atherosclerosis (ICD10-I70.0). Electronically Signed   By: Gerome Samavid  Williams III M.D   On: 09/24/2017 15:51   Ct Abdomen Pelvis W Contrast  Result Date: 09/24/2017 CLINICAL DATA:  Diabetic patient with generalized weakness. Nausea and vomiting. EXAM: CT CHEST, ABDOMEN, AND PELVIS WITH CONTRAST TECHNIQUE: Multidetector CT imaging of the chest, abdomen and pelvis was performed following the standard protocol during bolus administration of intravenous contrast. CONTRAST:  100mL ISOVUE-300 IOPAMIDOL (ISOVUE-300) INJECTION 61% COMPARISON:  Abdomen CT April 26, 2015 FINDINGS: CT CHEST FINDINGS Cardiovascular: The heart size is borderline. No obvious coronary artery calcifications. The main pulmonary artery measures 3.6 cm. No filling defects in the pulmonary arteries on  limited images. The thoracic aorta is normal in caliber. Taking motion artifact into account, no dissection. There is mild atherosclerosis in the arch. A superior aortic recess is identified with fluid adjacent to the aorta, extending inferiorly. The underlying aorta is normal in appearance. Mediastinum/Nodes: Tiny nodules in the thyroid of doubtful significance. The esophagus is normal in appearance. No adenopathy. No effusions. Lungs/Pleura: Central  airways are normal. No pneumothorax. Scattered atelectasis. No pulmonary nodules, masses, or focal infiltrates. Musculoskeletal: See below. CT ABDOMEN PELVIS FINDINGS Hepatobiliary: The patient is status post hepatic steatosis. No biliary duct dilatation. Limited views of the pulmonary arteries are normal with opacification. No liver masses are seen. Pancreas: Unremarkable. No pancreatic ductal dilatation or surrounding inflammatory changes. Spleen: Normal in size without focal abnormality. Adrenals/Urinary Tract: There is a probable cyst in the lower left kidney, too small to characterize. No hydronephrosis, suspicious mass, or stone. No ureterectasis or ureteral stone. The bladder is normal. Stomach/Bowel: The stomach and small bowel are normal. A few scattered colonic diverticuli are identified without diverticulitis. The appendix is not seen but there is no evidence of appendicitis. By report, the patient is status post appendectomy. Vascular/Lymphatic: The abdominal aorta is normal in caliber with no dissection. No adenopathy. Reproductive: Status post hysterectomy. No adnexal masses. Other: No abdominal wall hernia or abnormality. No abdominopelvic ascites. Musculoskeletal: No acute or significant osseous findings. IMPRESSION: 1. No acute abnormalities identified to explain the patient's symptoms. 2. The main pulmonary artery measures 3.6 cm raising the possibility of pulmonary arterial hypertension. 3. 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 Aortic Atherosclerosis (ICD10-I70.0). Electronically Signed   By: Gerome Sam III M.D   On: 09/24/2017 15:51   Dg Toe 5th Left  Result Date: 09/24/2017 CLINICAL DATA:  Hit left fifth toe yesterday.  Pain.  Bruising EXAM: DG TOE 5TH LEFT COMPARISON:  None. FINDINGS: Lucency at the base of the proximal phalanx of the left little toe compatible with fracture. No subluxation or dislocation. IMPRESSION: Minimally displaced fracture at the base of the proximal phalanx left  little toe. Electronically Signed   By: Charlett Nose M.D.   On: 09/24/2017 12:54     LOS: 6 days   Jeoffrey Massed, MD  [facility] Pager:[phone]  If 7PM-7AM, please contact night-coverage [email or web address] Password [email or web address] 09/30/2017, 11:23 AM

## 2017-09-30 NOTE — Progress Notes (Signed)
Inpatient Diabetes Program Recommendations  AACE/ADA: New Consensus Statement on Inpatient Glycemic Control (2015)  Target Ranges:  Prepandial:   less than 140 mg/dL      Peak postprandial:   less than 180 mg/dL (1-2 hours)      Critically ill patients:  140 - 180 mg/dL    Review of Glycemic Control  Diabetes history: DM 2 Outpatient Diabetes medications: Metformin 1000 mg QHS, Toujeo 34 units QHS, Trulicity 1.5 mg QThursday Current orders for Inpatient glycemic control: Lantus 50 units, Novolog Sensitive Correction 0-9 units  Inpatient Diabetes Program Recommendations:    IV Solumedrol 40 mg Q8hours. If patient remains on current dose. Consider Novolog Moderate Correction 0-15 units while on steroid therapy.  Thanks,  Christena Deem RN, MSN, [facility] Inpatient Diabetes Coordinator Team Pager [phone] (8a-5p)

## 2017-09-30 NOTE — Care Management Note (Signed)
Case Management Note  Patient Details  Name: TATUMN CORBRIDGE MRN: [id number] Date of Birth: 1950/11/05  Subjective/Objective:  Ordered for HHC-RN/PT/OT/aide-dtr decline HHRN. [facility] rep Drew aware of d/c & HHC orders. [facility] has already delivered 4 wheeled rw to rm. Already has home 02-[facility]. No further CM needs.                 Action/Plan:d/c home w/HHC/dme.   Expected Discharge Date:                  Expected Discharge Plan:  Home w Home Health Services  In-House Referral:     Discharge planning Services  CM Consult  Post Acute Care Choice:  Durable Medical Equipment([employer]-home 02-has travel tank) Choice offered to:  Patient  DME Arranged:  Walker rolling with seat DME Agency:  [facility]  HH Arranged:  PT, OT, Nurse's Aide, RN [facility] Agency:  [facility]  Status of Service:  Completed, signed off  If discussed at Long Length of Stay Meetings, dates discussed:    Additional Comments:  Lanier Clam, RN 09/30/2017, 9:46 AM

## 2017-09-30 NOTE — Progress Notes (Signed)
Occupational Therapy Treatment Patient Details Name: Kelli Rivera MRN: [id number] DOB: April 25, 1951 Today's Date: 09/30/2017    History of present illness 67 y.o. female admitted with COPD exacerbation and sepsis.  PMH includes:  Recent Lt fifth toe fracture; DM       Follow Up Recommendations  Home health OT;Supervision/Assistance - 24 hour    Equipment Recommendations  None recommended by OT    Recommendations for Other Services      Precautions / Restrictions Precautions Precautions: Fall Precaution Comments: watch O2 sats Required Braces or Orthoses: Other Brace/Splint Other Brace/Splint: post op shoe Lt foot  Restrictions Weight Bearing Restrictions: Yes       Mobility Bed Mobility Overal bed mobility: Needs Assistance Bed Mobility: Supine to Sit;Sit to Supine     Supine to sit: Min guard Sit to supine: Min guard      Transfers Overall transfer level: Needs assistance Equipment used: Rolling walker (2 wheeled) Transfers: Sit to/from [employer] Sit to Stand: Min assist Stand pivot transfers: Min assist       General transfer comment: Assist to power up and to steady.         ADL either performed or assessed with clinical judgement   ADL Overall ADL's : Needs assistance/impaired                         Toilet Transfer: BSC;Stand-pivot;Min guard   Toileting- Clothing Manipulation and Hygiene: Sit to/from stand;Min guard         General ADL Comments: Pt had walked with PT.  OT session focused on safety with ADL activity such as using BSC beside bed as well as beside chair when alone.  Pt feels she needs A with ADL activity . Communicated pt has OT, PT and aide ordered upon DC.  Pt has a tub with a seat. Educated pt not to perform this on her own. pt agereed     Vision Patient Visual Report: No change from baseline            Cognition Arousal/Alertness: Awake/alert Behavior During Therapy: WFL for tasks  assessed/performed Overall Cognitive Status: Within Functional Limits for tasks assessed                                                     Pertinent Vitals/ Pain       Pain Assessment: No/denies pain     Prior Functioning/Environment              Frequency  Min 2X/week        Progress Toward Goals  OT Goals(current goals can now be found in the care plan section)  Progress towards OT goals: Progressing toward goals     Plan         AM-PAC PT "6 Clicks" Daily Activity     Outcome Measure   Help from another person eating meals?: None Help from another person taking care of personal grooming?: A Little Help from another person toileting, which includes using toliet, bedpan, or urinal?: A Little Help from another person bathing (including washing, rinsing, drying)?: A Little Help from another person to put on and taking off regular upper body clothing?: A Little Help from another person to put on and taking off regular lower body clothing?: A Lot 6 Click  Score: 18    End of Session Equipment Utilized During Treatment: Rolling walker  OT Visit Diagnosis: Unsteadiness on feet (R26.81)   Activity Tolerance Patient limited by fatigue   Patient Left with call bell/phone within reach;in bed   Nurse Communication Other (comment);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

## 2017-09-30 NOTE — Progress Notes (Signed)
Physical Therapy Treatment Patient Details Name: Kelli Rivera MRN: [id number] DOB: 1951/07/04 Today's Date: 09/30/2017    History of Present Illness 66 y.o. female admitted with COPD exacerbation and sepsis.  PMH includes:  Recent Lt fifth toe fracture; DM     PT Comments    Pt feeling better but still present with dyspnea on exersion.  Pt stated she has been on oxygen about 6 years.  2 lts at rest and 3 lts with activity.  Assisted OOB to amb a greater distance in hallway however required x 3 rest breaks to complete full distance.  Pt does not want to go to SNF and really does not want to go to her daughters house who has multiple stairs and bedroom on second level.  Pt plans to D/C back to her apartment where her oxygen is all set up and she only has one step to enter.    Follow Up Recommendations  Home health PT     Equipment Recommendations  (rollator)   Was delivered and is in the room   Recommendations for Other Services       Precautions / Restrictions Precautions Precautions: Fall Precaution Comments: home O2 (2 lts at rest/3 lts with activity)    Mobility  Bed Mobility Overal bed mobility: Modified Independent Bed Mobility: Supine to Sit;Sit to Supine     Supine to sit: Min guard Sit to supine: Min guard   General bed mobility comments: able to transfer to EOB without assist   Transfers Overall transfer level: Needs assistance Equipment used: None Transfers: Sit to/from [employer] Sit to Stand: Supervision Stand pivot transfers: Supervision       General transfer comment: able to self perform using forward weight shift and momentum.  stand to sit is uncontrolled "plop" which pt stated "thats what I do".   Ambulation/Gait Ambulation/Gait assistance: Min guard Ambulation Distance (Feet): 115 Feet(x 3 resting breaks) Assistive device: 4-wheeled walker Gait Pattern/deviations: Step-through pattern;Shuffle Gait velocity: decreased    General Gait Details: slow pace, encouraged pursed lip breaths, noted increased SOB(3/4) with increased distance. Oxygen on 3 liters > 88%.   Stairs            Wheelchair Mobility    Modified Rankin (Stroke Patients Only)       Balance                                            Cognition Arousal/Alertness: Awake/alert Behavior During Therapy: WFL for tasks assessed/performed Overall Cognitive Status: Within Functional Limits for tasks assessed                                        Exercises      General Comments        Pertinent Vitals/Pain Pain Assessment: No/denies pain    Home Living                      Prior Function            PT Goals (current goals can now be found in the care plan section) Progress towards PT goals: Progressing toward goals    Frequency    Min 2X/week      PT Plan Current plan remains appropriate  Co-evaluation              AM-PAC PT "6 Clicks" Daily Activity  Outcome Measure  Difficulty turning over in bed (including adjusting bedclothes, sheets and blankets)?: A Little Difficulty moving from lying on back to sitting on the side of the bed? : A Little Difficulty sitting down on and standing up from a chair with arms (e.g., wheelchair, bedside commode, etc,.)?: A Little Help needed moving to and from a bed to chair (including a wheelchair)?: A Little Help needed walking in hospital room?: A Little Help needed climbing 3-5 steps with a railing? : A Lot 6 Click Score: 17    End of Session Equipment Utilized During Treatment: Oxygen Activity Tolerance: Patient tolerated treatment well Patient left: in chair;with call bell/phone within reach Nurse Communication: Mobility status PT Visit Diagnosis: Difficulty in walking, not elsewhere classified (R26.2)     Time: 6712-4580 PT Time Calculation (min) (ACUTE ONLY): 31 min  Charges:  $Gait Training: 8-22  mins $Therapeutic Activity: 8-22 mins                    G Codes:       Felecia Shelling  PTA [facility]  Acute  Rehab Pager      [phone]

## 2017-10-01 DIAGNOSIS — S92402A Displaced unspecified fracture of left great toe, initial encounter for closed fracture: Secondary | ICD-10-CM

## 2017-10-01 LAB — BASIC METABOLIC PANEL
ANION GAP: 7 (ref 5–15)
BUN: 24 mg/dL — AB (ref 6–20)
CHLORIDE: 98 mmol/L — AB (ref 101–111)
CO2: 37 mmol/L — AB (ref 22–32)
Calcium: 8.9 mg/dL (ref 8.9–10.3)
Creatinine, Ser: 0.69 mg/dL (ref 0.44–1.00)
GFR calc Af Amer: >60 mL/min (ref >60)
GLUCOSE: 171 mg/dL — AB (ref 65–99)
POTASSIUM: 4.1 mmol/L (ref 3.5–5.1)
Sodium: 142 mmol/L (ref 135–145)

## 2017-10-01 LAB — GLUCOSE, CAPILLARY
GLUCOSE-CAPILLARY: 121 mg/dL — AB (ref 65–99)
Glucose-Capillary: 78 mg/dL (ref 65–99)

## 2017-10-01 MED ORDER — IPRATROPIUM-ALBUTEROL 0.5-2.5 (3) MG/3ML IN SOLN: 3.0000 mL | Freq: Four times a day (QID) | RESPIRATORY_TRACT | 0 refills | Status: AC

## 2017-10-01 MED ORDER — BENZONATATE 200 MG PO CAPS: 200.0000 mg | ORAL_CAPSULE | Freq: Three times a day (TID) | ORAL | 0 refills | Status: DC | PRN

## 2017-10-01 MED ORDER — HYDROCOD POLST-CPM POLST ER 10-8 MG/5ML PO SUER: 5.0000 mL | Freq: Two times a day (BID) | ORAL | 0 refills | Status: DC | PRN

## 2017-10-01 MED ORDER — GUAIFENESIN ER 600 MG PO TB12: 1200.0000 mg | ORAL_TABLET | Freq: Two times a day (BID) | ORAL | 0 refills | Status: DC

## 2017-10-01 MED ORDER — PREDNISONE 10 MG PO TABS
ORAL_TABLET | ORAL | 0 refills | Status: DC
Start: 2017-10-01 — End: 2018-07-07

## 2017-10-01 MED ORDER — FUROSEMIDE 40 MG PO TABS: 40.0000 mg | ORAL_TABLET | Freq: Every day | ORAL | 0 refills | Status: AC

## 2017-10-01 NOTE — Discharge Summary (Signed)
PATIENT DETAILS Name: Kelli Rivera Age: 66 y.o. Sex: female Date of Birth: 13-Sep-1951 MRN: [id number]. Admitting Physician: Lorretta Harp, MD ELF:[id number], Genia Del, MD  Admit Date: 09/24/2017 Discharge date: 10/01/2017  Recommendations for Outpatient Follow-up:  1. Follow up with PCP in 1-2 weeks 2. Please obtain BMP/CBC in one week  Admitted From:  Home  Disposition: Home with home health services   Home Health: Yes  Equipment/Devices: Continue oxygen as previous  Discharge Condition: Stable  CODE STATUS: FULL CODE  Diet recommendation:  Heart Healthy / Carb Modified  Brief Summary: See H&P, Labs, Consult and Test reports for all details in brief, Patient is a 66 y.o. female with history of COPD, chronic hypoxemic respiratory failure on home O2, obesity-admitted to the hospitalist service for COPD exacerbation.  Treated with steroids, bronchodilators and other supportive care-hospital course was marked by slow improvement, by day of discharge, she felt much better, still with some scattered expiratory rhonchi but overall much better.  See below for further details   Brief Hospital Course: Acute and chronic hypoxemic respiratory failure secondary to COPD exacerbation:  Treated with steroids, bronchodilators, Mucinex, incentive spirometry/flutter valve, initially was on antimicrobial therapy as well.  She slowly improved during this hospital stay-by day of discharge she felt much better, main issue continues to be some cough that is gradually improving.  She is moving air well, she continues to have some few scattered rhonchi.  She ambulated with physical therapy yesterday, this morning, she is ready to go home and is actually requesting discharge.  On discharge, she will be continued on tapering steroids, she will continue her bronchodilator regimen, and follow-up with her primary care practitioner.  She is to continue using oxygen as previous.  She apparently has an  appointment for a outpatient polysomnography, that she has been asked to keep.  Insulin-dependent type 2 diabetes: CBGs relatively stable-with Lantus and SSI-resume her usual insulin and other agents on discharge.   Sinus bradycardia: Nocturnal bradycardia-likely related to undiagnosed sleep apnea-she is asymptomatic-cardiology briefly consulted-appreciate input, plans are for her to keep her outpatient polysomnography appointment.  Should improve after she starts using CPAP.    Probable OSA: Apparently patient is scheduled for an outpatient polysomnography, I have encouraged her to keep this appointment.  Chronic lower extremity edema: She was briefly treated with IV Lasix during this hospital stay-Per patient this is chronic-she has no other signs or symptoms of congestive heart failure.  Continue Lasix and fluid restriction on discharge.  Left fifth toe fracture: Continue supportive care-discussed case with Dr. Handy-recommended postop shoe on discharge.    Morbid obesity  Procedures/Studies: Echo 12/5>> - Left ventricle: The cavity size was normal. Wall thickness was normal. Systolic function was normal. The estimated ejection fraction was in the range of 55% to 60%. Wall motion was normal; there were no regional wall motion abnormalities. Doppler parameters are consistent with abnormal left ventricular relaxation (grade 1 diastolic dysfunction). The E/e&' ratio is between 8-15, suggesting indeterminate LV filling pressure. - Mitral valve: Mildly thickened leaflets . There was trivial regurgitation. - Left atrium: The atrium was normal in size. - Right atrium: The atrium was mildly dilated. - Atrial septum: Possible small mobile mass on the left atrial side of the septum - could be artifact, myxoma or possibly septal aneurysm, not well visualized and not seen in the 4 chamber view - consider repeat outpatient TTE or TEE to further evaluate. - Inferior vena  cava: The vessel was dilated. The respirophasic  diameter changes were blunted (<50%), consistent with elevated central venous pressure.   Discharge Diagnoses:  Principal Problem:   COPD with acute exacerbation (HCC) Active Problems:   Generalized weakness   Hyperlipidemia   Diabetes mellitus type 2 in obese (HCC)   Sepsis (HCC)   Diarrhea   Toe fracture, left   GERD (gastroesophageal reflux disease)   Iron deficiency   Tobacco abuse   Abdominal pain   Sinus bradycardia   Discharge Instructions:  Activity:  As tolerated with Full fall precautions use walker/cane & assistance as needed   Discharge Instructions    (HEART FAILURE PATIENTS) Call MD:  Anytime you have any of the following symptoms: 1) 3 pound weight gain in 24 hours or 5 pounds in 1 week 2) shortness of breath, with or without a dry hacking cough 3) swelling in the hands, feet or stomach 4) if you have to sleep on extra pillows at night in order to breathe.   Complete by:  As directed    Diet - low sodium heart healthy   Complete by:  As directed    Diet Carb Modified   Complete by:  As directed    Discharge instructions   Complete by:  As directed    Follow with Primary MD  Angelica Chessman, MD  and other consultant's as instructed your Hospitalist MD  Please get a complete blood count and chemistry panel checked by your Primary MD at your next visit, and again as instructed by your Primary MD.  Get Medicines reviewed and adjusted: Please take all your medications with you for your next visit with your Primary MD  Laboratory/radiological data: Please request your Primary MD to go over all hospital tests and procedure/radiological results at the follow up, please ask your Primary MD to get all Hospital records sent to his/her office.  In some cases, they will be blood work, cultures and biopsy results pending at the time of your discharge. Please request that your primary care M.D. follows up on these  results.  Also Note the following: If you experience worsening of your admission symptoms, develop shortness of breath, life threatening emergency, suicidal or homicidal thoughts you must seek medical attention immediately by calling 911 or calling your MD immediately  if symptoms less severe.  You must read complete instructions/literature along with all the possible adverse reactions/side effects for all the Medicines you take and that have been prescribed to you. Take any new Medicines after you have completely understood and accpet all the possible adverse reactions/side effects.   Do not drive when taking Pain medications or sleeping medications (Benzodaizepines)  Do not take more than prescribed Pain, Sleep and Anxiety Medications. It is not advisable to combine anxiety,sleep and pain medications without talking with your primary care practitioner  Special Instructions: If you have smoked or chewed Tobacco  in the last 2 yrs please stop smoking, stop any regular Alcohol  and or any Recreational drug use.  Wear Seat belts while driving.  Please note: You were cared for by a hospitalist during your hospital stay. Once you are discharged, your primary care physician will handle any further medical issues. Please note that NO REFILLS for any discharge medications will be authorized once you are discharged, as it is imperative that you return to your primary care physician (or establish a relationship with a primary care physician if you do not have one) for your post hospital discharge needs so that they can reassess your need for  medications and monitor your lab values.   Increase activity slowly   Complete by:  As directed      Allergies as of 10/01/2017      Reactions   Liraglutide Nausea And Vomiting   Pioglitazone Swelling   Amoxicillin-pot Clavulanate Nausea And Vomiting      Medication List    TAKE these medications   ADVAIR DISKUS 500-50 MCG/DOSE Aepb Generic drug:   Fluticasone-Salmeterol Inhale 1 puff into the lungs every 12 (twelve) hours.   albuterol 108 (90 Base) MCG/ACT inhaler Commonly known as:  PROVENTIL HFA;VENTOLIN HFA Inhale 2 puffs into the lungs every 4 (four) hours as needed for wheezing or shortness of breath.   aspirin EC 81 MG tablet Take 81 mg by mouth daily.   benzonatate 200 MG capsule Commonly known as:  TESSALON Take 1 capsule (200 mg total) by mouth 3 (three) times daily as needed for cough.   chlorpheniramine-HYDROcodone 10-8 MG/5ML Suer Commonly known as:  TUSSIONEX Take 5 mLs by mouth every 12 (twelve) hours as needed for cough.   fluticasone 50 MCG/ACT nasal spray Commonly known as:  FLONASE Place 2 sprays into the nose daily.   folic acid 1 MG tablet Commonly known as:  FOLVITE Take 1 mg by mouth at bedtime.   furosemide 40 MG tablet Commonly known as:  LASIX Take 1 tablet (40 mg total) by mouth daily. What changed:    when to take this  reasons to take this   guaiFENesin 600 MG 12 hr tablet Commonly known as:  MUCINEX Take 2 tablets (1,200 mg total) by mouth 2 (two) times daily.   ibuprofen 800 MG tablet Commonly known as:  ADVIL,MOTRIN Take 800 mg by mouth every 8 (eight) hours as needed for headache, mild pain or moderate pain.   ipratropium-albuterol 0.5-2.5 (3) MG/3ML Soln Commonly known as:  DUONEB Take 3 mLs by nebulization 4 (four) times daily.   metFORMIN 1000 MG tablet Commonly known as:  GLUCOPHAGE Take 1,000 mg by mouth at bedtime.   methotrexate 1 g injection Commonly known as:  50 mg/ml Inject 6 mg into the vein every Saturday.   montelukast 10 MG tablet Commonly known as:  SINGULAIR Take 10 mg by mouth at bedtime.   MYRBETRIQ 50 MG Tb24 tablet Generic drug:  mirabegron ER Take 50 mg by mouth daily.   omeprazole 40 MG capsule Commonly known as:  PRILOSEC Take 40 mg by mouth at bedtime.   predniSONE 10 MG tablet Commonly known as:  DELTASONE Take 4 tablets (40 mg) daily  for 2 days, then, Take 3 tablets (30 mg) daily for 2 days, then, Take 2 tablets (20 mg) daily for 2 days, then, Take 1 tablets (10 mg) daily for 1 days, then stop   tiZANidine 4 MG tablet Commonly known as:  ZANAFLEX Take 4 mg by mouth every 8 (eight) hours as needed for muscle spasms.   TOUJEO SOLOSTAR 300 UNIT/ML Sopn Generic drug:  Insulin Glargine Inject 34 Units into the skin at bedtime.   traMADol 50 MG tablet Commonly known as:  ULTRAM Take 50 mg by mouth 3 (three) times daily as needed for moderate pain or severe pain.   TRULICITY 1.5 MG/0.5ML Sopn Generic drug:  Dulaglutide Inject 1.5 mg into the skin every Thursday.            Durable Medical Equipment  (From admission, onward)        Start     Ordered   09/28/17 1452  For home use only DME 4 wheeled rolling walker with seat  Once    Comments:  Ht-5'4" wt-280lbs  Question:  Patient needs a walker to treat with the following condition  Answer:  Unsteady gait   09/28/17 1451     Follow-up Information    [facility] - Dme Follow up.   Why:  4 wheeled rolling walker Contact information: [address] [place] [address] [phone]        [facility] Follow up.   Specialty:  Home Health Services Why:  [facility] physical/occupational therapy/aide Contact information: [address] [place] [address] [phone]        Angelica Chessman, MD. Schedule an appointment as soon as possible for a visit in 1 week(s).   Specialty:  Family Medicine Contact information: [address] [place] [address] [phone]        Myrene Galas, MD. Schedule an appointment as soon as possible for a visit in 2 week(s).   Specialty:  Orthopedic Surgery Contact information: [address] [place] [address] [phone]          Allergies  Allergen Reactions  . Liraglutide Nausea And Vomiting  . Pioglitazone  Swelling  . Amoxicillin-Pot Clavulanate Nausea And Vomiting    Consultations:   None  Other Procedures/Studies: Dg Chest 2 View  Result Date: 09/24/2017 CLINICAL DATA:  Cough, congestion EXAM: CHEST  2 VIEW COMPARISON:  09/22/2016 FINDINGS: Heart is borderline in size. Lingular scarring. Mild vascular congestion. No overt edema or acute opacities. No effusions or acute bony abnormality. IMPRESSION: Borderline heart size.  Mild vascular congestion. Lingular scarring. Electronically Signed   By: Charlett Nose M.D.   On: 09/24/2017 12:37   Ct Chest W Contrast  Result Date: 09/24/2017 CLINICAL DATA:  Diabetic patient with generalized weakness. Nausea and vomiting. EXAM: CT CHEST, ABDOMEN, AND PELVIS WITH CONTRAST TECHNIQUE: Multidetector CT imaging of the chest, abdomen and pelvis was performed following the standard protocol during bolus administration of intravenous contrast. CONTRAST:  ISOVUE-300 IOPAMIDOL (ISOVUE-300) INJECTION 61% COMPARISON:  Abdomen CT April 26, 2015 FINDINGS: CT CHEST FINDINGS Cardiovascular: The heart size is borderline. No obvious coronary artery calcifications. The main pulmonary artery measures 3.6 cm. No filling defects in the pulmonary arteries on limited images. The thoracic aorta is normal in caliber. Taking motion artifact into account, no dissection. There is mild atherosclerosis in the arch. A superior aortic recess is identified with fluid adjacent to the aorta, extending inferiorly. The underlying aorta is normal in appearance. Mediastinum/Nodes: Tiny nodules in the thyroid of doubtful significance. The esophagus is normal in appearance. No adenopathy. No effusions. Lungs/Pleura: Central airways are normal. No pneumothorax. Scattered atelectasis. No pulmonary nodules, masses, or focal infiltrates. Musculoskeletal: See below. CT ABDOMEN PELVIS FINDINGS Hepatobiliary: The patient is status post hepatic steatosis. No biliary duct dilatation. Limited views of the  pulmonary arteries are normal with opacification. No liver masses are seen. Pancreas: Unremarkable. No pancreatic ductal dilatation or surrounding inflammatory changes. Spleen: Normal in size without focal abnormality. Adrenals/Urinary Tract: There is a probable cyst in the lower left kidney, too small to characterize. No hydronephrosis, suspicious mass, or stone. No ureterectasis or ureteral stone. The bladder is normal. Stomach/Bowel: The stomach and small bowel are normal. A few scattered colonic diverticuli are identified without diverticulitis. The appendix is not seen but there is no evidence of appendicitis. By report, the patient is status post  appendectomy. Vascular/Lymphatic: The abdominal aorta is normal in caliber with no dissection. No adenopathy. Reproductive: Status post hysterectomy. No adnexal masses. Other: No abdominal wall hernia or abnormality. No abdominopelvic ascites. Musculoskeletal: No acute or significant osseous findings. IMPRESSION: 1. No acute abnormalities identified to explain the patient's symptoms. 2. The main pulmonary artery measures 3.6 cm raising the poss<[facility] CHEST FINDINGS Cardiovascular: The heart size is borderline. No obvious coronary artery calcifications. The main pulmonary artery measures 3.6 cm. No filling defects in  the pulmonary arteries on limited images. The thoracic aorta is normal in caliber. Taking motion artifact into account, no dissection. There is mild atherosclerosis in the arch. A superior aortic recess is identified with fluid adjacent to the aorta, extending inferiorly. The underlying aorta is normal in appearance. Mediastinum/Nodes: Tiny nodules in the thyroid of doubtful significance. The esophagus is normal in appearance. No adenopathy. No effusions. Lungs/Pleura: Central airways are normal. No pneumothorax. Scattered atelectasis. No pulmonary nodules, masses, or focal infiltrates. Musculoskeletal: See below. CT ABDOMEN PELVIS FINDINGS Hepatobiliary: The patient is status post hepatic steatosis. No biliary duct dilatation. Limited views of the pulmonary arteries are normal with opacification. No liver masses are seen. Pancreas: Unremarkable. No pancreatic ductal dilatation or surrounding inflammatory changes. Spleen: Normal in size without focal abnormality. Adrenals/Urinary Tract: There is a probable cyst in the lower left kidney, too small to characterize. No hydronephrosis, suspicious mass, or stone. No ureterectasis or ureteral stone. The bladder is normal. Stomach/Bowel: The stomach and small bowel are normal. A few scattered colonic diverticuli are identified without diverticulitis. The appendix is not seen but there is no evidence of appendicitis. By report, the patient is status post appendectomy. Vascular/Lymphatic: The abdominal aorta is normal in caliber with no dissection. No adenopathy. Reproductive: Status post hysterectomy. No adnexal masses. Other: No abdominal wall hernia or abnormality. No abdominopelvic ascites. Musculoskeletal: No acute or significant osseous findings. IMPRESSION: 1. No acute abnormalities identified to explain the patient's symptoms. 2. The main pulmonary artery measures 3.6 cm raising the possibility of pulmonary arterial hypertension. 3. Atherosclerosis in the aortic  arch. Aortic Atherosclerosis (ICD10-I70.0). Electronically Signed   By: David  Williams III M.D   On: 09/24/2017 15:51   Dg Toe 5th Left  Result Date: 09/24/2017 CLINICAL DATA:  Hit left fifth toe yesterday.  Pain.  Bruising EXAM: DG TOE 5TH LEFT COMPARISON:  None. FINDINGS: Lucency at the base of the proximal phalanx of the left little toe compatible with fracture. No subluxation or dislocation. IMPRESSION: Minimally displaced fracture at the base of the proximal phalanx left little toe. Electronically Signed   By: Kevin  Dover M.D.   On: 09/24/2017 12:54     TODAY-DAY OF DISCHARGE:  Subjective:   Kelli Rivera today has no headache,no chest abdominal pain,no new weakness tingling or numbness, feels much better wants to go home today.   Objective:   Blood pressure 126/68, pulse (!) 56, temperature 98.1 F (36.7 C),  temperature source Oral, resp. rate 18, height 5\' 4"  (1.626 m), weight 117.1 kg (258 lb 3.2 oz), SpO2 98 %.  Intake/Output Summary (Last 24 hours) at 10/01/2017 1003 Last data filed at 10/01/2017 0506 Gross per 24 hour  Intake 490 ml  Output 4100 ml  Net -3610 ml   Filed Weights   09/28/17 0052 09/30/17 0500 10/01/17 0459  Weight: 127.2 kg (280 lb 6.8 oz) 120.5 kg (265 lb 10.5 oz) 117.1 kg (258 lb 3.2 oz)    Exam: Awake Alert, Oriented *3, No new F.N deficits, Normal affect .AT,PERRAL Supple Neck,No JVD, No cervical lymphadenopathy appriciated.  Symmetrical Chest wall movement, Good air movement bilaterally,few scattered rhonchi RRR,No Gallops,Rubs or new Murmurs, No Parasternal Heave +ve B.Sounds, Abd Soft, Non tender, No organomegaly appriciated, No rebound -guarding or rigidity. No Cyanosis, Clubbing or edema, No new Rash or bruise   PERTINENT RADIOLOGIC STUDIES: Dg Chest 2 View  Result Date: 09/24/2017 CLINICAL DATA:  Cough, congestion EXAM: CHEST  2 VIEW COMPARISON:  09/22/2016 FINDINGS: Heart is borderline in size. Lingular scarring. Mild vascular  congestion. No overt edema or acute opacities. No effusions or acute bony abnormality. IMPRESSION: Borderline heart size.  Mild vascular congestion. Lingular scarring. Electronically Signed   By: 09/24/2016 M.D.   On: 09/24/2017 12:37   Ct Chest W Contrast  Result Date: 09/24/2017 CLINICAL DATA:  Diabetic patient with generalized weakness. Nausea and vomiting. EXAM: CT CHEST, ABDOMEN, AND PELVIS WITH CONTRAST TECHNIQUE: Multidetector CT imaging of the chest, abdomen and pelvis was performed following the standard protocol during bolus administration of intravenous contrast. CONTRAST:  14/10/2016 ISOVUE-300 IOPAMIDOL (ISOVUE-300) INJECTION 61% COMPARISON:  Abdomen CT April 26, 2015 FINDINGS: CT CHEST FINDINGS Cardiovascular: The heart size is borderline. No obvious coronary artery calcifications. The main pulmonary artery measures 3.6 cm. No filling defects in the pulmonary arteries on limited images. The thoracic aorta is normal in caliber. Taking motion artifact into account, no dissection. There is mild atherosclerosis in the arch. A superior aortic recess is identified with fluid adjacent to the aorta, extending inferiorly. The underlying aorta is normal in appearance. Mediastinum/Nodes: Tiny nodules in the thyroid of doubtful significance. The esophagus is normal in appearance. No adenopathy. No effusions. Lungs/Pleura: Central airways are normal. No pneumothorax. Scattered atelectasis. No pulmonary nodules, masses, or focal infiltrates. Musculoskeletal: See below. CT ABDOMEN PELVIS FINDINGS Hepatobiliary: The patient is status post hepatic steatosis. No biliary duct dilatation. Limited views of the pulmonary arteries are normal with opacification. No liver masses are seen. Pancreas: Unremarkable. No pancreatic ductal dilatation or surrounding inflammatory changes. Spleen: Normal in size without focal abnormality. Adrenals/Urinary Tract: There is a probable cyst in the lower left kidney, too small to  characterize. No hydronephrosis, suspicious mass, or stone. No ureterectasis or ureteral stone. The bladder is normal. Stomach/Bowel: The stomach and small bowel are normal. A few scattered colonic diverticuli are identified without diverticulitis. The appendix is not seen but there is no evidence of appendicitis. By report, the patient is status post appendectomy. Vascular/Lymphatic: The abdominal aorta is normal in caliber with no dissection. No adenopathy. Reproductive: Status post hysterectomy. No adnexal masses. Other: No abdominal wall hernia or abnormality. No abdominopelvic ascites. Musculoskeletal: No acute or significant osseous findings. IMPRESSION: 1. No acute abnormalities identified to explain the patient's symptoms. 2. The main pulmonary artery measures 3.6 cm raising the possibility of pulmonary arterial hypertension. 3. 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 Aortic Atherosclerosis (ICD10-I70.0). Electronically Signed   By: April 28, 2015 III M.D  On: 09/24/2017 15:51   Ct Abdomen Pelvis W Contrast  Result Date: 09/24/2017 CLINICAL DATA:  Diabetic patient with generalized weakness. Nausea and vomiting. EXAM: CT CHEST, ABDOMEN, AND PELVIS WITH CONTRAST TECHNIQUE: Multidetector CT imaging of the chest, abdomen and pelvis was performed following the standard protocol during bolus administration of intravenous contrast. CONTRAST:  100mL ISOVUE-300 IOPAMIDOL (ISOVUE-300) INJECTION 61% COMPARISON:  Abdomen CT April 26, 2015 FINDINGS: CT CHEST FINDINGS Cardiovascular: The heart size is borderline. No obvious coronary artery calcifications. The main pulmonary artery measures 3.6 cm. No filling defects in the pulmonary arteries on limited images. The thoracic aorta is normal in caliber. Taking motion artifact into account, no dissection. There is mild atherosclerosis in the arch. A superior aortic recess is identified with fluid adjacent to the aorta, extending inferiorly. The underlying aorta is normal in  appearance. Mediastinum/Nodes: Tiny nodules in the thyroid of doubtful significance. The esophagus is normal in appearance. No adenopathy. No effusions. Lungs/Pleura: Central airways are normal. No pneumothorax. Scattered atelectasis. No pulmonary nodules, masses, or focal infiltrates. Musculoskeletal: See below. CT ABDOMEN PELVIS FINDINGS Hepatobiliary: The patient is status post hepatic steatosis. No biliary duct dilatation. Limited views of the pulmonary arteries are normal with opacification. No liver masses are seen. Pancreas: Unremarkable. No pancreatic ductal dilatation or surrounding inflammatory changes. Spleen: Normal in size without focal abnormality. Adrenals/Urinary Tract: There is a probable cyst in the lower left kidney, too small to characterize. No hydronephrosis, suspicious mass, or stone. No ureterectasis or ureteral stone. The bladder is normal. Stomach/Bowel: The stomach and small bowel are normal. A few scattered colonic diverticuli are identified without diverticulitis. The appendix is not seen but there is no evidence of appendicitis. By report, the patient is status post appendectomy. Vascular/Lymphatic: The abdominal aorta is normal in caliber with no dissection. No adenopathy. Reproductive: Status post hysterectomy. No adnexal masses. Other: No abdominal wall hernia or abnormality. No abdominopelvic ascites. Musculoskeletal: No acute or significant osseous findings. IMPRESSION: 1. No acute abnormalities identified to explain the patient's symptoms. 2. The main pulmonary artery measures 3.6 cm raising the possibility of pulmonary arterial hypertension. 3. 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 Aortic Atherosclerosis (ICD10-I70.0). Electronically Signed   By: Gerome Samavid  Williams III M.D   On: 09/24/2017 15:51   Dg Toe 5th Left  Result Date: 09/24/2017 CLINICAL DATA:  Hit left fifth toe yesterday.  Pain.  Bruising EXAM: DG TOE 5TH LEFT COMPARISON:  None. FINDINGS: Lucency at the base of the  proximal phalanx of the left little toe compatible with fracture. No subluxation or dislocation. IMPRESSION: Minimally displaced fracture at the base of the proximal phalanx left little toe. Electronically Signed   By: Charlett NoseKevin  Dover M.D.   On: 09/24/2017 12:54     PERTINENT LAB RESULTS: CBC: Recent Labs    09/29/17 0433 09/30/17 0505  WBC 20.9* 23.6*  HGB 10.6* 11.3*  HCT 35.1* 37.0  PLT 229 257   CMET CMP     Component Value Date/Time   NA 142 10/01/2017 0503   K 4.1 10/01/2017 0503   CL 98 (L) 10/01/2017 0503   CO2 37 (H) 10/01/2017 0503   GLUCOSE 171 (H) 10/01/2017 0503   BUN 24 (H) 10/01/2017 0503   CREATININE 0.69 10/01/2017 0503   CALCIUM 8.9 10/01/2017 0503   PROT 5.8 (L) 09/27/2017 0340   ALBUMIN 2.5 (L) 09/27/2017 0340   AST 16 09/27/2017 0340   ALT 13 (L) 09/27/2017 0340   ALKPHOS 80 09/27/2017 0340   BILITOT 0.4 09/27/2017  0340   GFRNONAA >60 10/01/2017 0503   GFRAA >60 10/01/2017 0503    GFR Estimated Creatinine Clearance: 85.9 mL/min (by C-G formula based on SCr of 0.69 mg/dL). No results for input(s): LIPASE, AMYLASE in the last 72 hours. No results for input(s): CKTOTAL, CKMB, CKMBINDEX, TROPONINI in the last 72 hours. Invalid input(s): POCBNP No results for input(s): DDIMER in the last 72 hours. No results for input(s): HGBA1C in the last 72 hours. No results for input(s): CHOL, HDL, LDLCALC, TRIG, CHOLHDL, LDLDIRECT in the last 72 hours. No results for input(s): TSH, T4TOTAL, T3FREE, THYROIDAB in the last 72 hours.  Invalid input(s): FREET3 No results for input(s): VITAMINB12, FOLATE, FERRITIN, TIBC, IRON, RETICCTPCT in the last 72 hours. Coags: No results for input(s): INR in the last 72 hours.  Invalid input(s): PT Microbiology: Recent Results (from the past 240 hour(s))  Blood Culture (routine x 2)     Status: None   Collection Time: 09/24/17  2:35 PM  Result Value Ref Range Status   Specimen Description BLOOD LEFT FOREARM  Final   Special  Requests   Final    BOTTLES DRAWN AEROBIC AND ANAEROBIC Blood Culture results may not be optimal due to an excessive volume of blood received in culture bottles   Culture   Final    NO GROWTH 5 DAYS Performed at [facility], [address], [place], [place] [address]    Report Status 09/29/2017 FINAL  Final  Blood Culture (routine x 2)     Status: None   Collection Time: 09/24/17  2:35 PM  Result Value Ref Range Status   Specimen Description BLOOD RIGHT HAND  Final   Special Requests   Final    BOTTLES DRAWN AEROBIC AND ANAEROBIC Blood Culture results may not be optimal due to an inadequate volume of blood received in culture bottles   Culture   Final    NO GROWTH 5 DAYS Performed at [facility], [address], [place], [place] [address]    Report Status 09/29/2017 FINAL  Final  MRSA PCR Screening     Status: Abnormal   Collection Time: 09/24/17  9:37 PM  Result Value Ref Range Status   MRSA by PCR POSITIVE (A) NEGATIVE Final    Comment:        The GeneXpert MRSA Assay (FDA approved for NASAL specimens only), is one component of a comprehensive MRSA colonization surveillance program. It is not intended to diagnose MRSA infection nor to guide or monitor treatment for MRSA infections. RESULT CALLED TO, READ BACK BY AND VERIFIED WITH: R MCINTOSH AT 0333 ON 12.02.2018 BY NBROOKS   Respiratory Panel by PCR     Status: None   Collection Time: 09/25/17  2:40 AM  Result Value Ref Range Status   Adenovirus NOT DETECTED NOT DETECTED Final   Coronavirus 229E NOT DETECTED NOT DETECTED Final   Coronavirus HKU1 NOT DETECTED NOT DETECTED Final   Coronavirus NL63 NOT DETECTED NOT DETECTED Final   Coronavirus OC43 NOT DETECTED NOT DETECTED Final   Metapneumovirus NOT DETECTED NOT DETECTED Final   Rhinovirus / Enterovirus NOT DETECTED NOT DETECTED Final   Influenza A NOT DETECTED NOT DETECTED Final   Influenza B NOT DETECTED NOT DETECTED Final   Parainfluenza Virus 1 NOT  DETECTED NOT DETECTED Final   Parainfluenza Virus 2 NOT DETECTED NOT DETECTED Final   Parainfluenza Virus 3 NOT DETECTED NOT DETECTED Final   Parainfluenza Virus 4 NOT DETECTED NOT DETECTED Final  Respiratory Syncytial Virus NOT DETECTED NOT DETECTED Final   Bordetella pertussis NOT DETECTED NOT DETECTED Final   Chlamydophila pneumoniae NOT DETECTED NOT DETECTED Final   Mycoplasma pneumoniae NOT DETECTED NOT DETECTED Final    Comment: Performed at [facility], [address] [place], [place] [address]  Culture, sputum-assessment     Status: None   Collection Time: 09/27/17  9:37 AM  Result Value Ref Range Status   Specimen Description SPUTUM  Final   Special Requests NONE  Final   Sputum evaluation THIS SPECIMEN IS ACCEPTABLE FOR SPUTUM CULTURE  Final   Report Status 09/27/2017 FINAL  Final  Culture, respiratory (NON-Expectorated)     Status: None   Collection Time: 09/27/17  9:37 AM  Result Value Ref Range Status   Specimen Description SPUTUM  Final   Special Requests NONE Reflexed from [id number]  Final   Gram Stain   Final    RARE WBC PRESENT, PREDOMINANTLY PMN FEW SQUAMOUS EPITHELIAL CELLS PRESENT FEW YEAST WITH PSEUDOHYPHAE    Culture   Final    Consistent with normal respiratory flora. Performed at [facility], [address], [place], [place] [address]    Report Status 09/29/2017 FINAL  Final    FURTHER DISCHARGE INSTRUCTIONS:  Get Medicines reviewed and adjusted: Please take all your medications with you for your next visit with your Primary MD  Laboratory/radiological data: Please request your Primary MD to go over all hospital tests and procedure/radiological results at the follow up, please ask your Primary MD to get all Hospital records sent to his/her office.  In some cases, they will be blood work, cultures and biopsy results pending at the time of your discharge. Please request that your primary care M.D. goes through all the records of your  hospital data and follows up on these results.  Also Note the following: If you experience worsening of your admission symptoms, develop shortness of breath, life threatening emergency, suicidal or homicidal thoughts you must seek medical attention immediately by calling 911 or calling your MD immediately  if symptoms less severe.  You must read complete instructions/literature along with all the possible adverse reactions/side effects for all the Medicines you take and that have been prescribed to you. Take any new Medicines after you have completely understood and accpet all the possible adverse reactions/side effects.   Do not drive when taking Pain medications or sleeping medications (Benzodaizepines)  Do not take more than prescribed Pain, Sleep and Anxiety Medications. It is not advisable to combine anxiety,sleep and pain medications without talking with your primary care practitioner  Special Instructions: If you have smoked or chewed Tobacco  in the last 2 yrs please stop smoking, stop any regular Alcohol  and or any Recreational drug use.  Wear Seat belts while driving.  Please note: You were cared for by a hospitalist during your hospital stay. Once you are discharged, your primary care physician will handle any further medical issues. Please note that NO REFILLS for any discharge medications will be authorized once you are discharged, as it is imperative that you return to your primary care physician (or establish a relationship with a primary care physician if you do not have one) for your post hospital discharge needs so that they can reassess your need for medications and monitor your lab values.  Total Time spent coordinating discharge including counseling, education and face to face time equals 35 minutes.  Signed: Juwana Thoreson 10/01/2017 10:03 AM

## 2018-01-09 ENCOUNTER — Emergency Department (HOSPITAL_BASED_OUTPATIENT_CLINIC_OR_DEPARTMENT_OTHER)
Admission: EM | Admit: 2018-01-09 | Discharge: 2018-01-09 | Disposition: A | Discharge status: Home / Self Care | Payer: Medicare Other | Attending: Emergency Medicine | Admitting: Emergency Medicine

## 2018-01-09 ENCOUNTER — Emergency Department (HOSPITAL_BASED_OUTPATIENT_CLINIC_OR_DEPARTMENT_OTHER): Payer: Medicare Other

## 2018-01-09 ENCOUNTER — Encounter (HOSPITAL_BASED_OUTPATIENT_CLINIC_OR_DEPARTMENT_OTHER): Payer: Self-pay | Admitting: Emergency Medicine

## 2018-01-09 ENCOUNTER — Other Ambulatory Visit: Payer: Self-pay

## 2018-01-09 DIAGNOSIS — R1032 Left lower quadrant pain: Secondary | ICD-10-CM | POA: Diagnosis present

## 2018-01-09 DIAGNOSIS — J449 Chronic obstructive pulmonary disease, unspecified: Secondary | ICD-10-CM | POA: Insufficient documentation

## 2018-01-09 DIAGNOSIS — Z79899 Other long term (current) drug therapy: Secondary | ICD-10-CM | POA: Insufficient documentation

## 2018-01-09 DIAGNOSIS — Z7982 Long term (current) use of aspirin: Secondary | ICD-10-CM | POA: Diagnosis not present

## 2018-01-09 DIAGNOSIS — J45909 Unspecified asthma, uncomplicated: Secondary | ICD-10-CM | POA: Diagnosis not present

## 2018-01-09 DIAGNOSIS — Z87891 Personal history of nicotine dependence: Secondary | ICD-10-CM | POA: Diagnosis not present

## 2018-01-09 DIAGNOSIS — E119 Type 2 diabetes mellitus without complications: Secondary | ICD-10-CM | POA: Insufficient documentation

## 2018-01-09 DIAGNOSIS — Z7984 Long term (current) use of oral hypoglycemic drugs: Secondary | ICD-10-CM | POA: Insufficient documentation

## 2018-01-09 LAB — COMPREHENSIVE METABOLIC PANEL
ALK PHOS: 66 U/L (ref 38–126)
ALT: 9 U/L — ABNORMAL LOW (ref 14–54)
ANION GAP: 7 (ref 5–15)
AST: 14 U/L — ABNORMAL LOW (ref 15–41)
Albumin: 3.2 g/dL — ABNORMAL LOW (ref 3.5–5.0)
BUN: 17 mg/dL (ref 6–20)
CO2: 28 mmol/L (ref 22–32)
Calcium: 8.4 mg/dL — ABNORMAL LOW (ref 8.9–10.3)
Chloride: 104 mmol/L (ref 101–111)
Creatinine, Ser: 0.87 mg/dL (ref 0.44–1.00)
Glucose, Bld: 88 mg/dL (ref 65–99)
Potassium: 3.7 mmol/L (ref 3.5–5.1)
SODIUM: 139 mmol/L (ref 135–145)
Total Bilirubin: 0.3 mg/dL (ref 0.3–1.2)
Total Protein: 6.5 g/dL (ref 6.5–8.1)

## 2018-01-09 LAB — CBC
HCT: 36 % (ref 36.0–46.0)
HEMOGLOBIN: 10.9 g/dL — AB (ref 12.0–15.0)
MCH: 24.2 pg — ABNORMAL LOW (ref 26.0–34.0)
MCHC: 30.3 g/dL (ref 30.0–36.0)
MCV: 79.8 fL (ref 78.0–100.0)
PLATELETS: 296 10*3/uL (ref 150–400)
RBC: 4.51 MIL/uL (ref 3.87–5.11)
RDW: 18.4 % — ABNORMAL HIGH (ref 11.5–15.5)
WBC: 13 10*3/uL — AB (ref 4.0–10.5)

## 2018-01-09 LAB — LIPASE, BLOOD: LIPASE: 33 U/L (ref 11–51)

## 2018-01-09 MED ORDER — MORPHINE SULFATE (PF) 4 MG/ML IV SOLN
4.0000 mg | Freq: Once | INTRAVENOUS | Status: AC
  Administered 2018-01-09: 4 mg via INTRAVENOUS
  Filled 2018-01-09: qty 1

## 2018-01-09 MED ORDER — ONDANSETRON 4 MG PO TBDP: 4.0000 mg | ORAL_TABLET | Freq: Three times a day (TID) | ORAL | 0 refills | Status: AC | PRN

## 2018-01-09 MED ORDER — SODIUM CHLORIDE 0.9 % IV BOLUS (SEPSIS)
1000.0000 mL | Freq: Once | INTRAVENOUS | Status: AC
  Administered 2018-01-09: 1000 mL via INTRAVENOUS

## 2018-01-09 MED ORDER — IOPAMIDOL (ISOVUE-300) INJECTION 61%
100.0000 mL | Freq: Once | INTRAVENOUS | Status: AC | PRN
  Administered 2018-01-09: 100 mL via INTRAVENOUS

## 2018-01-09 MED ORDER — ONDANSETRON HCL 4 MG/2ML IJ SOLN
4.0000 mg | Freq: Once | INTRAMUSCULAR | Status: AC
  Administered 2018-01-09: 4 mg via INTRAVENOUS
  Filled 2018-01-09: qty 2

## 2018-01-09 NOTE — ED Triage Notes (Addendum)
Patient reports LLQ abdominal pain x 1 week.  States the pain originated in her back last week but has since radiated to her lower abdomen.  Reports nausea.  Denies vomiting, diarrhea, dysuria, hematuria.  Seen at PCP prior to coming to ER-UA performed-negative for UTI per pt.

## 2018-01-09 NOTE — ED Provider Notes (Signed)
[facility] EMERGENCY DEPARTMENT Provider Note   CSN: [id number] Arrival date & time: 01/09/18  1712     History   Chief Complaint Chief Complaint  Patient presents with  . Abdominal Pain    HPI Kelli Rivera is a 67 y.o. female.  HPI  Kelli Rivera is a 67yo female with a history of COPD (2L home O2), type 2 diabetes, hyperlipidemia, morbid obesity who presents to the emergency department from her primary care office for evaluation of left lower quadrant pain.  Patient states that she developed left low back pain about a week ago which quickly radiated to the left lower quadrant of her abdomen.  She states that her pain feels "throbbing" in nature and is worsened with any type of movement or palpation over the abdomen.  She reports that her pain is improved when laying still (4/10 in severity), but becomes 10/10 in severity with movement.  She has tried taking her tramadol without significant improvement in the pain.  Reports that she had some chills a couple of days ago.  Also has associated nausea, but denies vomiting.  She denies fever, diarrhea, melena, hematochezia, dysuria, urinary frequency, hematuria, vaginal discharge, flank pain, chest pain, shortness of breath, lightheadedness.  She has had a hysterectomy, cholecystectomy and appendectomy in the past, no other abdominal surgeries that she is aware of.  Her last bowel movement was earlier today and normal.  Past Medical History:  Diagnosis Date  . Asthma   . Collagen vascular disease (HCC)   . COPD (chronic obstructive pulmonary disease) (HCC)    O2 increased to 3L while ambulating, 2L at rest as of last week  . Diabetes mellitus without complication (HCC)   . High cholesterol   . History of leukocytosis    negative bone marrow, followed by heme q74mo  . Morbid obesity [facility])     Patient Active Problem List   Diagnosis Date Noted  . Sinus bradycardia   . Abdominal pain 09/25/2017  . Sepsis (HCC) 09/24/2017    . Diarrhea 09/24/2017  . Toe fracture, left 09/24/2017  . GERD (gastroesophageal reflux disease) 09/24/2017  . Iron deficiency 09/24/2017  . Tobacco abuse 09/24/2017  . Generalized weakness 09/23/2016  . Leukocytosis 09/23/2016  . Morbid obesity (HCC) 09/23/2016  . COPD with acute exacerbation (HCC) 09/23/2016  . Hyperlipidemia 09/23/2016  . Diabetes mellitus type 2 in obese (HCC) 09/23/2016  . Elevated lactic acid level 09/23/2016  . Cellulitis 09/22/2016    Past Surgical History:  Procedure Laterality Date  . ABDOMINAL HYSTERECTOMY    . APPENDECTOMY     age 69 yrs  . bladder tuck    . CHOLECYSTECTOMY      OB History    No data available       Home Medications    Prior to Admission medications   Medication Sig Start Date End Date Taking? Authorizing Provider  albuterol (PROVENTIL HFA;VENTOLIN HFA) 108 (90 Base) MCG/ACT inhaler Inhale 2 puffs into the lungs every 4 (four) hours as needed for wheezing or shortness of breath. 12/17/15   [provider]  aspirin EC 81 MG tablet Take 81 mg by mouth daily.    [provider]  benzonatate (TESSALON) 200 MG capsule Take 1 capsule (200 mg total) by mouth 3 (three) times daily as needed for cough. 10/01/17   Ghimire, Werner Lean, MD  chlorpheniramine-HYDROcodone (TUSSIONEX) 10-8 MG/5ML SUER Take 5 mLs by mouth every 12 (twelve) hours as needed for cough. 10/01/17  Ghimire, Werner Lean, MD  fluticasone (FLONASE) 50 MCG/ACT nasal spray Place 2 sprays into the nose daily.    [provider]  Fluticasone-Salmeterol (ADVAIR DISKUS) 500-50 MCG/DOSE AEPB Inhale 1 puff into the lungs every 12 (twelve) hours.    [provider]  folic acid (FOLVITE) 1 MG tablet Take 1 mg by mouth at bedtime.    [provider]  furosemide (LASIX) 40 MG tablet Take 1 tablet (40 mg total) by mouth daily. 10/01/17   Ghimire, Werner Lean, MD  guaiFENesin (MUCINEX) 600 MG 12 hr tablet Take 2 tablets (1,200 mg total) by mouth 2  (two) times daily. 10/01/17   Ghimire, Werner Lean, MD  ibuprofen (ADVIL,MOTRIN) 800 MG tablet Take 800 mg by mouth every 8 (eight) hours as needed for headache, mild pain or moderate pain.  09/07/17   [provider]  ipratropium-albuterol (DUONEB) 0.5-2.5 (3) MG/3ML SOLN Take 3 mLs by nebulization 4 (four) times daily. 10/01/17   Ghimire, Werner Lean, MD  metFORMIN (GLUCOPHAGE) 1000 MG tablet Take 1,000 mg by mouth at bedtime.     [provider]  methotrexate (50 MG/ML) 1 g injection Inject 6 mg into the vein every Saturday.    [provider]  mirabegron ER (MYRBETRIQ) 50 MG TB24 tablet Take 50 mg by mouth daily. 05/24/16   [provider]  montelukast (SINGULAIR) 10 MG tablet Take 10 mg by mouth at bedtime. 08/18/15   [provider]  omeprazole (PRILOSEC) 40 MG capsule Take 40 mg by mouth at bedtime.  08/31/16   [provider]  predniSONE (DELTASONE) 10 MG tablet Take 4 tablets (40 mg) daily for 2 days, then, Take 3 tablets (30 mg) daily for 2 days, then, Take 2 tablets (20 mg) daily for 2 days, then, Take 1 tablets (10 mg) daily for 1 days, then stop 10/01/17   Ghimire, Werner Lean, MD  tiZANidine (ZANAFLEX) 4 MG tablet Take 4 mg by mouth every 8 (eight) hours as needed for muscle spasms.  09/13/16   [provider]  TOUJEO SOLOSTAR 300 UNIT/ML SOPN Inject 34 Units into the skin at bedtime.  08/31/16   [provider]  traMADol (ULTRAM) 50 MG tablet Take 50 mg by mouth 3 (three) times daily as needed for moderate pain or severe pain.  08/16/16   [provider]  TRULICITY 1.5 MG/0.5ML SOPN Inject 1.5 mg into the skin every Thursday. 09/19/17   [provider]    Family History Family History  Problem Relation Age of Onset  . Heart failure Mother 9  . Lung cancer Father 25    Social History Social History   Tobacco Use  . Smoking status: Former Smoker    Packs/day: 1.00    Years: 42.00    Pack years:  42.00    Last attempt to quit: 2007    Years since quitting: 12.2  . Smokeless tobacco: Never Used  Substance Use Topics  . Alcohol use: No  . Drug use: No     Allergies   Liraglutide; Pioglitazone; and Amoxicillin-pot clavulanate   Review of Systems Review of Systems  Constitutional: Positive for chills. Negative for fever.  Eyes: Negative for visual disturbance.  Respiratory: Negative for shortness of breath.   Cardiovascular: Negative for chest pain.  Gastrointestinal: Positive for abdominal pain (LLQ) and nausea. Negative for blood in stool, diarrhea and vomiting.  Genitourinary: Negative for difficulty urinating, dysuria, flank pain, frequency and vaginal discharge.  Musculoskeletal: Negative for back pain  and gait problem.  Skin: Negative for rash.  Neurological: Negative for headaches.  Psychiatric/Behavioral: Negative for agitation.     Physical Exam Updated Vital Signs BP 127/69 (BP Location: Right Arm)   Pulse 73   Temp 98.8 F (37.1 C) (Oral)   Resp 18   Ht 5\' 4"  (1.626 m)   Wt 120.7 kg (266 lb)   SpO2 95%   BMI 45.66 kg/m   Physical Exam  Constitutional: She is oriented to person, place, and time. She appears well-developed and well-nourished. No distress.  Sitting at bedside in no apparent distress.  Nontoxic-appearing.  HENT:  Head: Normocephalic and atraumatic.  Mouth/Throat: Oropharynx is clear and moist. No oropharyngeal exudate.  Mucous membranes moist.  Eyes: Conjunctivae are normal. Pupils are equal, round, and reactive to light. Right eye exhibits no discharge. Left eye exhibits no discharge.  Neck: Normal range of motion. Neck supple.  Cardiovascular: Normal rate, regular rhythm and intact distal pulses. Exam reveals no friction rub.  No murmur heard. Pulmonary/Chest: Effort normal and breath sounds normal. No stridor. No respiratory distress. She has no wheezes. She has no rales.  Abdominal: Soft. Bowel sounds are normal.  Abdomen soft and  nondistended.  Acutely tender to palpation in the left lower quadrant with guarding.  No rebound tenderness.  No CVA tenderness.  Musculoskeletal: Normal range of motion.  No midline T-spine or L-spine tenderness.  No tenderness over the bilateral paraspinal muscles of the lumbar spine.  No rash over the back.  Neurological: She is alert and oriented to person, place, and time. Coordination normal.  Skin: Skin is warm and dry. Capillary refill takes less than 2 seconds. She is not diaphoretic.  Psychiatric: She has a normal mood and affect. Her behavior is normal.  Nursing note and vitals reviewed.    ED Treatments / Results  Labs (all labs ordered are listed, but only abnormal results are displayed) Labs Reviewed  COMPREHENSIVE METABOLIC PANEL - Abnormal; Notable for the following components:      Result Value   Calcium 8.4 (*)    Albumin 3.2 (*)    AST 14 (*)    ALT 9 (*)    All other components within normal limits  CBC - Abnormal; Notable for the following components:   WBC 13.0 (*)    Hemoglobin 10.9 (*)    MCH 24.2 (*)    RDW 18.4 (*)    All other components within normal limits  LIPASE, BLOOD    EKG  EKG Interpretation None       Radiology No results found.  Procedures Procedures (including critical care time)  Medications Ordered in ED Medications - No data to display   Initial Impression / Assessment and Plan / ED Course  I have reviewed the triage vital signs and the nursing notes.  Pertinent labs & imaging results that were available during my care of the patient were reviewed by me and considered in my medical decision making (see chart for details).     Presents with LLQ pain for the past week. Associated nausea. On exam she is afebrile and non-toxic appearing. VSS. She is acutely tender in the LLQ with guarding. Concern for acute diverticulitis. Patient given fluids, IV pain management and anti-emetic. CT scan pending.   Labs reviewed. Patient has  a mild leukocytosis (WBC 13.0), although patient has a history of chronic leukocytosis and this is much improved from her baseline. No major electrolyte abnormalities with CMP. No AKI. Lipase within normal.  Patient's urine collected at her primary care office today with no hemoglobin or signs of infection.   Sign out given at shift change to oncoming PA [facility] for disposition following return of CT abdomen/pelvis.   Final Clinical Impressions(s) / ED Diagnoses   Final diagnoses:  Left lower quadrant pain    ED Discharge Orders    None       Lawrence Marseilles 01/10/18 1707    Doug Sou, MD 01/11/18 [phone]

## 2018-01-09 NOTE — ED Notes (Addendum)
Room air O2 sat down to 80% - pt denies shortness of breath, offers that she uses home O2 @ 2lpm QHS. O2 applied @ 2lpm via Nasal Cannula. Cherlyn Mayotte, PA.

## 2018-01-09 NOTE — ED Provider Notes (Signed)
Complains of left lower quadrant pain onset a week ago.  Pain is nonradiating treated with tramadol this afternoon, without relief.  She feels much improved since treatment here.  On exam she is in no distress abdomen morbidly obese, nontender.  Extremities without edema.  Patient is able to ambulate without difficulty.   Doug Sou, MD 01/09/18 2122

## 2018-01-09 NOTE — Discharge Instructions (Signed)
1. Medications: Take your home medications including tramadol as prescribed.  Take Zofran as needed for nausea.  Wait around 20 minutes before eating or drinking after taking this medication. 2. Treatment: rest, drink plenty of fluids, advance diet slowly.  Start with water and broth then advance to bland foods that will not upset your stomach such as crackers, mashed potatoes, and peanut butter. 3. Follow Up: Please followup with your primary doctor in 2-3 days for discussion of your diagnoses and further evaluation after today's visit; if you do not have a primary care doctor use the resource guide provided to find one; Please return to the ER for persistent vomiting, high fevers or worsening symptoms

## 2018-01-09 NOTE — ED Provider Notes (Signed)
Received patient at signout from [facility].  Refer to provider note for full history and physical examination.  Briefly, patient is a 67 year old obese female with history of COPD, type 2 diabetes, HLD presenting for evaluation of 1 week of abdominal pain.  Pain is primarily in the left lower quadrant with associated nausea and but no other concerning symptoms.  Pain is worse with movement.  Lab work reviewed by me hows a leukocytosis which is apparently chronic and much improved today as compared to previous labs.  She did have a UA done at her primary care office earlier today which was not concerning for UTI or nephrolithiasis.  Remainder of lab work is reassuring.  We are awaiting CT scan at this time to rule out diverticulitis.  Of note, the patient did desat to 80% on room air but she is on home O2 at 2 L/min via nasal cannula.  Is she was ambulated with stable SPO2 saturations on 2 L via nasal cannula.  She denies shortness of breath at this time.                                                                               She is denying shortness of breath at this time physical Exam  BP 127/69 (BP Location: Right Arm)   Pulse 73   Temp 98.8 F (37.1 C) (Oral)   Resp 18   Ht 5\' 4"  (1.626 m)   Wt 120.7 kg (266 lb)   SpO2 95%   BMI 45.66 kg/m   Physical Exam  Constitutional: She appears well-developed and well-nourished. No distress.  HENT:  Head: Normocephalic and atraumatic.  Eyes: Conjunctivae are normal. Right eye exhibits no discharge. Left eye exhibits no discharge.  Neck: No JVD present. No tracheal deviation present.  Cardiovascular: Normal rate and regular rhythm.  Pulmonary/Chest: Effort normal and breath sounds normal.  Abdominal: Soft. Bowel sounds are normal. She exhibits no distension. There is tenderness in the left lower quadrant. There is no rigidity, no rebound, no guarding and no CVA tenderness.  Musculoskeletal: She exhibits no edema.  Velcro splint on the left  wrist  Neurological: She is alert.  Skin: Skin is warm and dry. No erythema.  Psychiatric: She has a normal mood and affect. Her behavior is normal.  Nursing note and vitals reviewed.   ED Course/Procedures     Procedures  MDM  CT shows no evidence for acute intra-abdominal or pelvic abnormalities.  She does have a fat-containing left lumbar hernia but no evidence of incarceration or strangulation.  I doubt acute surgical abdominal pathology based on patient's workup.  On reevaluation she states she is feeling much better and she has very mild tenderness to palpation in the left lower quadrant which is improved from when she was originally here.  Abdomen remains soft and overall benign in appearance.  He continues to deny shortness of breath and I doubt COPD exacerbation given lungs are clear to auscultation bilaterally.  She has tramadol which she takes at home and I will discharge her with Zofran for nausea.  Encouraged patient to advance diet slowly.  Discussed indications for return to ED.  She will follow-up with her primary  care physician in the next 2-3 days. Pt and patient's family verbalized understanding of and agreement with plan and is safe for discharge home at this time.        Jeanie Sewer, PA-C 01/10/18 0021    Doug Sou, MD 01/10/18 [phone]

## 2018-07-05 ENCOUNTER — Emergency Department (HOSPITAL_BASED_OUTPATIENT_CLINIC_OR_DEPARTMENT_OTHER): Payer: Medicare Other

## 2018-07-05 ENCOUNTER — Encounter (HOSPITAL_BASED_OUTPATIENT_CLINIC_OR_DEPARTMENT_OTHER): Payer: Self-pay

## 2018-07-05 ENCOUNTER — Other Ambulatory Visit: Payer: Self-pay

## 2018-07-05 ENCOUNTER — Observation Stay (HOSPITAL_BASED_OUTPATIENT_CLINIC_OR_DEPARTMENT_OTHER)
Admission: EM | Admit: 2018-07-05 | Discharge: 2018-07-07 | Disposition: A | Discharge status: Home / Self Care | Payer: Medicare Other | Attending: Internal Medicine | Admitting: Internal Medicine

## 2018-07-05 DIAGNOSIS — Z801 Family history of malignant neoplasm of trachea, bronchus and lung: Secondary | ICD-10-CM | POA: Insufficient documentation

## 2018-07-05 DIAGNOSIS — Z90710 Acquired absence of both cervix and uterus: Secondary | ICD-10-CM | POA: Insufficient documentation

## 2018-07-05 DIAGNOSIS — L0390 Cellulitis, unspecified: Secondary | ICD-10-CM | POA: Diagnosis present

## 2018-07-05 DIAGNOSIS — Z1611 Resistance to penicillins: Secondary | ICD-10-CM | POA: Insufficient documentation

## 2018-07-05 DIAGNOSIS — K219 Gastro-esophageal reflux disease without esophagitis: Secondary | ICD-10-CM | POA: Insufficient documentation

## 2018-07-05 DIAGNOSIS — T48205A Adverse effect of unspecified drugs acting on muscles, initial encounter: Secondary | ICD-10-CM | POA: Diagnosis not present

## 2018-07-05 DIAGNOSIS — N3000 Acute cystitis without hematuria: Secondary | ICD-10-CM | POA: Diagnosis present

## 2018-07-05 DIAGNOSIS — M069 Rheumatoid arthritis, unspecified: Secondary | ICD-10-CM | POA: Insufficient documentation

## 2018-07-05 DIAGNOSIS — Z881 Allergy status to other antibiotic agents status: Secondary | ICD-10-CM | POA: Insufficient documentation

## 2018-07-05 DIAGNOSIS — B961 Klebsiella pneumoniae [K. pneumoniae] as the cause of diseases classified elsewhere: Secondary | ICD-10-CM | POA: Insufficient documentation

## 2018-07-05 DIAGNOSIS — G4733 Obstructive sleep apnea (adult) (pediatric): Secondary | ICD-10-CM | POA: Diagnosis not present

## 2018-07-05 DIAGNOSIS — D721 Eosinophilia, unspecified: Secondary | ICD-10-CM | POA: Diagnosis present

## 2018-07-05 DIAGNOSIS — L538 Other specified erythematous conditions: Secondary | ICD-10-CM | POA: Diagnosis not present

## 2018-07-05 DIAGNOSIS — Z7951 Long term (current) use of inhaled steroids: Secondary | ICD-10-CM | POA: Insufficient documentation

## 2018-07-05 DIAGNOSIS — M549 Dorsalgia, unspecified: Secondary | ICD-10-CM | POA: Diagnosis not present

## 2018-07-05 DIAGNOSIS — E785 Hyperlipidemia, unspecified: Secondary | ICD-10-CM | POA: Diagnosis not present

## 2018-07-05 DIAGNOSIS — J449 Chronic obstructive pulmonary disease, unspecified: Secondary | ICD-10-CM | POA: Diagnosis not present

## 2018-07-05 DIAGNOSIS — D72829 Elevated white blood cell count, unspecified: Secondary | ICD-10-CM | POA: Diagnosis not present

## 2018-07-05 DIAGNOSIS — L03119 Cellulitis of unspecified part of limb: Secondary | ICD-10-CM | POA: Diagnosis present

## 2018-07-05 DIAGNOSIS — R531 Weakness: Secondary | ICD-10-CM | POA: Diagnosis not present

## 2018-07-05 DIAGNOSIS — Z888 Allergy status to other drugs, medicaments and biological substances status: Secondary | ICD-10-CM | POA: Insufficient documentation

## 2018-07-05 DIAGNOSIS — L309 Dermatitis, unspecified: Secondary | ICD-10-CM | POA: Insufficient documentation

## 2018-07-05 DIAGNOSIS — E1169 Type 2 diabetes mellitus with other specified complication: Secondary | ICD-10-CM | POA: Diagnosis present

## 2018-07-05 DIAGNOSIS — Z79899 Other long term (current) drug therapy: Secondary | ICD-10-CM | POA: Insufficient documentation

## 2018-07-05 DIAGNOSIS — Z6841 Body Mass Index (BMI) 40.0 and over, adult: Secondary | ICD-10-CM | POA: Insufficient documentation

## 2018-07-05 DIAGNOSIS — E669 Obesity, unspecified: Secondary | ICD-10-CM

## 2018-07-05 DIAGNOSIS — E7800 Pure hypercholesterolemia, unspecified: Secondary | ICD-10-CM | POA: Insufficient documentation

## 2018-07-05 DIAGNOSIS — Z9049 Acquired absence of other specified parts of digestive tract: Secondary | ICD-10-CM | POA: Insufficient documentation

## 2018-07-05 DIAGNOSIS — Z794 Long term (current) use of insulin: Secondary | ICD-10-CM | POA: Diagnosis not present

## 2018-07-05 DIAGNOSIS — M359 Systemic involvement of connective tissue, unspecified: Secondary | ICD-10-CM | POA: Diagnosis not present

## 2018-07-05 DIAGNOSIS — N390 Urinary tract infection, site not specified: Secondary | ICD-10-CM | POA: Insufficient documentation

## 2018-07-05 DIAGNOSIS — Z880 Allergy status to penicillin: Secondary | ICD-10-CM | POA: Insufficient documentation

## 2018-07-05 DIAGNOSIS — Z7982 Long term (current) use of aspirin: Secondary | ICD-10-CM | POA: Diagnosis not present

## 2018-07-05 DIAGNOSIS — Z87891 Personal history of nicotine dependence: Secondary | ICD-10-CM | POA: Insufficient documentation

## 2018-07-05 DIAGNOSIS — M1990 Unspecified osteoarthritis, unspecified site: Secondary | ICD-10-CM | POA: Diagnosis present

## 2018-07-05 DIAGNOSIS — J441 Chronic obstructive pulmonary disease with (acute) exacerbation: Secondary | ICD-10-CM | POA: Diagnosis present

## 2018-07-05 DIAGNOSIS — E119 Type 2 diabetes mellitus without complications: Secondary | ICD-10-CM | POA: Insufficient documentation

## 2018-07-05 DIAGNOSIS — I872 Venous insufficiency (chronic) (peripheral): Secondary | ICD-10-CM | POA: Insufficient documentation

## 2018-07-05 DIAGNOSIS — Z8249 Family history of ischemic heart disease and other diseases of the circulatory system: Secondary | ICD-10-CM | POA: Insufficient documentation

## 2018-07-05 NOTE — ED Triage Notes (Signed)
C/o fatigue, red patches to bilat LE and bilat hands x 3 days-NAD-to triage in w/c

## 2018-07-05 NOTE — ED Notes (Addendum)
Two days of redness and swelling to bilateral legs, pain to bilateral hands and generalized body aches. Pt has had this before and was treated for cellulitis. Pt c/o itching to feet as well.

## 2018-07-05 NOTE — ED Provider Notes (Addendum)
[facility] EMERGENCY DEPARTMENT Provider Note   CSN: [id number] Arrival date & time: 07/05/18  2239     History   Chief Complaint Chief Complaint  Patient presents with  . Fatigue    HPI Kelli Rivera is a 67 y.o. female.  The history is provided by the patient and a relative.  Rash   This is a recurrent problem. The current episode started more than 2 days ago. The problem has been gradually worsening. The problem is associated with nothing. There has been no fever. Affected Location: B feet and lower extremities with fatigue and global weakness. The pain is at a severity of 5/10. The pain is moderate. The pain has been constant since onset. Associated symptoms include itching. She has tried nothing for the symptoms. The treatment provided no relief. Risk factors: none.  No f/c/r.  No urinary symptoms.    Past Medical History:  Diagnosis Date  . Asthma   . Collagen vascular disease (HCC)   . COPD (chronic obstructive pulmonary disease) (HCC)    O2 increased to 3L while ambulating, 2L at rest as of last week  . Diabetes mellitus without complication (HCC)   . High cholesterol   . History of leukocytosis    negative bone marrow, followed by heme q20mo  . Morbid obesity [facility])     Patient Active Problem List   Diagnosis Date Noted  . Sinus bradycardia   . Abdominal pain 09/25/2017  . Sepsis (HCC) 09/24/2017  . Diarrhea 09/24/2017  . Toe fracture, left 09/24/2017  . GERD (gastroesophageal reflux disease) 09/24/2017  . Iron deficiency 09/24/2017  . Tobacco abuse 09/24/2017  . Generalized weakness 09/23/2016  . Leukocytosis 09/23/2016  . Morbid obesity (HCC) 09/23/2016  . COPD with acute exacerbation (HCC) 09/23/2016  . Hyperlipidemia 09/23/2016  . Diabetes mellitus type 2 in obese (HCC) 09/23/2016  . Elevated lactic acid level 09/23/2016  . Cellulitis 09/22/2016    Past Surgical History:  Procedure Laterality Date  . ABDOMINAL HYSTERECTOMY    .  APPENDECTOMY     age 53 yrs  . bladder tuck    . CHOLECYSTECTOMY       OB History   None      Home Medications    Prior to Admission medications   Medication Sig Start Date End Date Taking? Authorizing Provider  albuterol (PROVENTIL HFA;VENTOLIN HFA) 108 (90 Base) MCG/ACT inhaler Inhale 2 puffs into the lungs every 4 (four) hours as needed for wheezing or shortness of breath. 12/17/15   [provider]  aspirin EC 81 MG tablet Take 81 mg by mouth daily.    [provider]  benzonatate (TESSALON) 200 MG capsule Take 1 capsule (200 mg total) by mouth 3 (three) times daily as needed for cough. 10/01/17   Ghimire, Werner Lean, MD  chlorpheniramine-HYDROcodone (TUSSIONEX) 10-8 MG/5ML SUER Take 5 mLs by mouth every 12 (twelve) hours as needed for cough. 10/01/17   Ghimire, Werner Lean, MD  fluticasone (FLONASE) 50 MCG/ACT nasal spray Place 2 sprays into the nose daily.    [provider]  Fluticasone-Salmeterol (ADVAIR DISKUS) 500-50 MCG/DOSE AEPB Inhale 1 puff into the lungs every 12 (twelve) hours.    [provider]  folic acid (FOLVITE) 1 MG tablet Take 1 mg by mouth at bedtime.    [provider]  furosemide (LASIX) 40 MG tablet Take 1 tablet (40 mg total) by mouth daily. 10/01/17   Ghimire, Werner Lean, MD  guaiFENesin (MUCINEX) 600 MG  12 hr tablet Take 2 tablets (1,200 mg total) by mouth 2 (two) times daily. 10/01/17   Ghimire, Werner Lean, MD  ibuprofen (ADVIL,MOTRIN) 800 MG tablet Take 800 mg by mouth every 8 (eight) hours as needed for headache, mild pain or moderate pain.  09/07/17   [provider]  ipratropium-albuterol (DUONEB) 0.5-2.5 (3) MG/3ML SOLN Take 3 mLs by nebulization 4 (four) times daily. 10/01/17   Ghimire, Werner Lean, MD  metFORMIN (GLUCOPHAGE) 1000 MG tablet Take 1,000 mg by mouth at bedtime.     [provider]  methotrexate (50 MG/ML) 1 g injection Inject 6 mg into the vein every Saturday.    [provider]    mirabegron ER (MYRBETRIQ) 50 MG TB24 tablet Take 50 mg by mouth daily. 05/24/16   [provider]  montelukast (SINGULAIR) 10 MG tablet Take 10 mg by mouth at bedtime. 08/18/15   [provider]  omeprazole (PRILOSEC) 40 MG capsule Take 40 mg by mouth at bedtime.  08/31/16   [provider]  predniSONE (DELTASONE) 10 MG tablet Take 4 tablets (40 mg) daily for 2 days, then, Take 3 tablets (30 mg) daily for 2 days, then, Take 2 tablets (20 mg) daily for 2 days, then, Take 1 tablets (10 mg) daily for 1 days, then stop 10/01/17   Ghimire, Werner Lean, MD  tiZANidine (ZANAFLEX) 4 MG tablet Take 4 mg by mouth every 8 (eight) hours as needed for muscle spasms.  09/13/16   [provider]  TOUJEO SOLOSTAR 300 UNIT/ML SOPN Inject 34 Units into the skin at bedtime.  08/31/16   [provider]  traMADol (ULTRAM) 50 MG tablet Take 50 mg by mouth 3 (three) times daily as needed for moderate pain or severe pain.  08/16/16   [provider]  TRULICITY 1.5 MG/0.5ML SOPN Inject 1.5 mg into the skin every Thursday. 09/19/17   [provider]    Family History Family History  Problem Relation Age of Onset  . Heart failure Mother 76  . Lung cancer Father 77    Social History Social History   Tobacco Use  . Smoking status: Former Smoker    Packs/day: 1.00    Years: 42.00    Pack years: 42.00    Last attempt to quit: 2007    Years since quitting: 12.7  . Smokeless tobacco: Never Used  Substance Use Topics  . Alcohol use: No  . Drug use: No     Allergies   Liraglutide; Pioglitazone; and Amoxicillin-pot clavulanate   Review of Systems Review of Systems  Constitutional: Positive for fatigue. Negative for chills and diaphoresis.  HENT: Negative for facial swelling and sore throat.   Respiratory: Negative for cough and shortness of breath.   Cardiovascular: Negative for chest pain.  Gastrointestinal: Negative for abdominal pain and  nausea.  Genitourinary: Negative for flank pain.  Skin: Positive for itching and rash.  All other systems reviewed and are negative.    Physical Exam Updated Vital Signs BP 113/61 (BP Location: Right Arm)   Pulse (!) 119   Temp 98.9 F (37.2 C) (Oral)   Resp (!) 24   Ht 5\' 3"  (1.6 m)   Wt 115.2 kg   SpO2 91% Comment: use home O2 prn  BMI 44.99 kg/m   Physical Exam  Constitutional: She appears well-developed and well-nourished.  HENT:  Head: Normocephalic and atraumatic.  Mouth/Throat: No oropharyngeal exudate.  Eyes: Pupils are equal, round, and reactive to light. Conjunctivae are  normal.  Neck: Normal range of motion. Neck supple.  Cardiovascular: Normal rate, regular rhythm, normal heart sounds and intact distal pulses.  Pulmonary/Chest: Effort normal and breath sounds normal. No stridor. She has no wheezes. She has no rales.  Abdominal: Soft. Bowel sounds are normal. She exhibits no mass. There is no tenderness. There is no rebound and no guarding.  Musculoskeletal: Normal range of motion. She exhibits no deformity.  Neurological: She is alert. She displays normal reflexes.  Skin: Skin is warm and dry. Capillary refill takes less than 2 seconds. She is not diaphoretic. There is erythema.  B feet and shins of RLE and above knee medially RLE  Psychiatric: She has a normal mood and affect.     ED Treatments / Results  Labs (all labs ordered are listed, but only abnormal results are displayed) Results for orders placed or performed during the hospital encounter of 07/05/18  Urinalysis, Routine w reflex microscopic  Result Value Ref Range   Color, Urine YELLOW YELLOW   APPearance HAZY (A) CLEAR   Specific Gravity, Urine 1.025 1.005 - 1.030   pH 6.0 5.0 - 8.0   Glucose, UA NEGATIVE NEGATIVE mg/dL   Hgb urine dipstick NEGATIVE NEGATIVE   Bilirubin Urine SMALL (A) NEGATIVE   Ketones, ur 15 (A) NEGATIVE mg/dL   Protein, ur NEGATIVE NEGATIVE mg/dL   Nitrite POSITIVE (A)  NEGATIVE   Leukocytes, UA TRACE (A) NEGATIVE  CBC with Differential  Result Value Ref Range   WBC 22.1 (H) 4.0 - 10.5 K/uL   RBC 5.01 3.87 - 5.11 MIL/uL   Hemoglobin 12.2 12.0 - 15.0 g/dL   HCT 51.0 25.8 - 52.7 %   MCV 77.6 (L) 78.0 - 100.0 fL   MCH 24.4 (L) 26.0 - 34.0 pg   MCHC 31.4 30.0 - 36.0 g/dL   RDW 78.2 (H) 42.3 - 53.6 %   Platelets  150 - 400 K/uL    PLATELET CLUMPS NOTED ON SMEAR, COUNT APPEARS ADEQUATE   Neutrophils Relative % 75 %   Lymphocytes Relative 7 %   Monocytes Relative 3 %   Eosinophils Relative 14 %   Basophils Relative 1 %   Neutro Abs 16.6 (H) 1.7 - 7.7 K/uL   Lymphs Abs 1.5 0.7 - 4.0 K/uL   Monocytes Absolute 0.7 0.1 - 1.0 K/uL   Eosinophils Absolute 3.1 (H) 0.0 - 0.7 K/uL   Basophils Absolute 0.2 (H) 0.0 - 0.1 K/uL   RBC Morphology RARE NRBCs    WBC Morphology MILD LEFT SHIFT (1-5% METAS, OCC MYELO, OCC BANDS)    Smear Review PLATELET CLUMPS NOTED ON SMEAR   Troponin I  Result Value Ref Range   Troponin I <0.03 <0.03 ng/mL  Comprehensive metabolic panel  Result Value Ref Range   Sodium 139 135 - 145 mmol/L   Potassium 3.7 3.5 - 5.1 mmol/L   Chloride 103 98 - 111 mmol/L   CO2 28 22 - 32 mmol/L   Glucose, Bld 224 (H) 70 - 99 mg/dL   BUN 18 8 - 23 mg/dL   Creatinine, Ser 1.44 0.44 - 1.00 mg/dL   Calcium 8.2 (L) 8.9 - 10.3 mg/dL   Total Protein 5.1 (L) 6.5 - 8.1 g/dL   Albumin 2.4 (L) 3.5 - 5.0 g/dL   AST 11 (L) 15 - 41 U/L   ALT 10 0 - 44 U/L   Alkaline Phosphatase 64 38 - 126 U/L   Total Bilirubin 0.6 0.3 - 1.2 mg/dL   GFR  calc non Af Amer >60 >60 mL/min   GFR calc Af Amer >60 >60 mL/min   Anion gap 8 5 - 15  Urinalysis, Microscopic (reflex)  Result Value Ref Range   RBC / HPF 0-5 0 - 5 RBC/hpf   WBC, UA 6-10 0 - 5 WBC/hpf   Bacteria, UA MANY (A) NONE SEEN   Squamous Epithelial / LPF 0-5 0 - 5   Mucus PRESENT    Dg Chest 2 View  Result Date: 07/06/2018 CLINICAL DATA:  Redness and swelling of legs for 2 days, weakness, pain in  BILATERAL hands, generalized body aches, history of cellulitis, diabetes mellitus, COPD, asthma, former smoker EXAM: CHEST - 2 VIEW COMPARISON:  09/24/2017 FINDINGS: Normal heart size, mediastinal contours, and pulmonary vascularity. Atherosclerotic calcifications aorta. Lungs clear. No pleural effusion or pneumothorax. Bones unremarkable. IMPRESSION: No acute abnormalities. Electronically Signed   By: Ulyses Southward M.D.   On: 07/06/2018 00:27    EKG EKG Interpretation  Date/Time:  Wednesday July 05 2018 23:03:38 EDT Ventricular Rate:  110 PR Interval:    QRS Duration: 88 QT Interval:  320 QTC Calculation: 433 R Axis:   -46 Text Interpretation:  Sinus tachycardia Consider anterior infarct Baseline wander in lead(s) V6 Confirmed by Nicanor Alcon, Emmalie Haigh ([id number]) on 07/05/2018 11:14:04 PM   Radiology No results found.  Procedures Procedures (including critical care time)  Medications Ordered in ED Medications  vancomycin (VANCOCIN) IVPB 1000 mg/200 mL premix (has no administration in time range)  piperacillin-tazobactam (ZOSYN) IVPB 3.375 g (3.375 g Intravenous New Bag/Given 07/06/18 0034)  0.9 %  sodium chloride infusion (500 mLs Intravenous New Bag/Given 07/06/18 0033)     Final Clinical Impressions(s) / ED Diagnoses   Will need admission for BLE cellulitis and UTI in an elderly immune suppressed patient with leukocytosis.       Harlen Danford, MD 07/06/18 [id number]    Cy Blamer, MD 07/06/18 1950

## 2018-07-05 NOTE — ED Notes (Signed)
Patient transported to X-ray 

## 2018-07-06 ENCOUNTER — Encounter (HOSPITAL_BASED_OUTPATIENT_CLINIC_OR_DEPARTMENT_OTHER): Payer: Self-pay | Admitting: Emergency Medicine

## 2018-07-06 DIAGNOSIS — G4733 Obstructive sleep apnea (adult) (pediatric): Secondary | ICD-10-CM | POA: Diagnosis present

## 2018-07-06 DIAGNOSIS — J449 Chronic obstructive pulmonary disease, unspecified: Secondary | ICD-10-CM | POA: Diagnosis not present

## 2018-07-06 DIAGNOSIS — L988 Other specified disorders of the skin and subcutaneous tissue: Secondary | ICD-10-CM

## 2018-07-06 DIAGNOSIS — I872 Venous insufficiency (chronic) (peripheral): Secondary | ICD-10-CM | POA: Diagnosis present

## 2018-07-06 DIAGNOSIS — M359 Systemic involvement of connective tissue, unspecified: Secondary | ICD-10-CM | POA: Diagnosis not present

## 2018-07-06 DIAGNOSIS — L03119 Cellulitis of unspecified part of limb: Secondary | ICD-10-CM | POA: Diagnosis not present

## 2018-07-06 DIAGNOSIS — D721 Eosinophilia, unspecified: Secondary | ICD-10-CM | POA: Diagnosis present

## 2018-07-06 DIAGNOSIS — E669 Obesity, unspecified: Secondary | ICD-10-CM

## 2018-07-06 DIAGNOSIS — Z90710 Acquired absence of both cervix and uterus: Secondary | ICD-10-CM | POA: Diagnosis not present

## 2018-07-06 DIAGNOSIS — E1169 Type 2 diabetes mellitus with other specified complication: Secondary | ICD-10-CM

## 2018-07-06 DIAGNOSIS — N390 Urinary tract infection, site not specified: Secondary | ICD-10-CM | POA: Diagnosis not present

## 2018-07-06 DIAGNOSIS — T48205A Adverse effect of unspecified drugs acting on muscles, initial encounter: Secondary | ICD-10-CM | POA: Diagnosis not present

## 2018-07-06 DIAGNOSIS — L309 Dermatitis, unspecified: Secondary | ICD-10-CM

## 2018-07-06 DIAGNOSIS — E785 Hyperlipidemia, unspecified: Secondary | ICD-10-CM | POA: Diagnosis not present

## 2018-07-06 DIAGNOSIS — Z7982 Long term (current) use of aspirin: Secondary | ICD-10-CM | POA: Diagnosis not present

## 2018-07-06 DIAGNOSIS — R5383 Other fatigue: Secondary | ICD-10-CM

## 2018-07-06 DIAGNOSIS — R531 Weakness: Secondary | ICD-10-CM | POA: Diagnosis not present

## 2018-07-06 DIAGNOSIS — K219 Gastro-esophageal reflux disease without esophagitis: Secondary | ICD-10-CM | POA: Diagnosis not present

## 2018-07-06 DIAGNOSIS — D72829 Elevated white blood cell count, unspecified: Secondary | ICD-10-CM | POA: Diagnosis not present

## 2018-07-06 DIAGNOSIS — E119 Type 2 diabetes mellitus without complications: Secondary | ICD-10-CM | POA: Diagnosis not present

## 2018-07-06 DIAGNOSIS — Z888 Allergy status to other drugs, medicaments and biological substances status: Secondary | ICD-10-CM

## 2018-07-06 DIAGNOSIS — L538 Other specified erythematous conditions: Secondary | ICD-10-CM | POA: Diagnosis not present

## 2018-07-06 DIAGNOSIS — N3000 Acute cystitis without hematuria: Secondary | ICD-10-CM | POA: Diagnosis not present

## 2018-07-06 DIAGNOSIS — L0390 Cellulitis, unspecified: Secondary | ICD-10-CM | POA: Diagnosis not present

## 2018-07-06 DIAGNOSIS — B961 Klebsiella pneumoniae [K. pneumoniae] as the cause of diseases classified elsewhere: Secondary | ICD-10-CM | POA: Diagnosis not present

## 2018-07-06 DIAGNOSIS — Z87891 Personal history of nicotine dependence: Secondary | ICD-10-CM

## 2018-07-06 DIAGNOSIS — R21 Rash and other nonspecific skin eruption: Secondary | ICD-10-CM | POA: Diagnosis not present

## 2018-07-06 DIAGNOSIS — Z1611 Resistance to penicillins: Secondary | ICD-10-CM | POA: Diagnosis not present

## 2018-07-06 DIAGNOSIS — Z794 Long term (current) use of insulin: Secondary | ICD-10-CM | POA: Diagnosis not present

## 2018-07-06 DIAGNOSIS — M1990 Unspecified osteoarthritis, unspecified site: Secondary | ICD-10-CM | POA: Diagnosis present

## 2018-07-06 DIAGNOSIS — Z79899 Other long term (current) drug therapy: Secondary | ICD-10-CM | POA: Diagnosis not present

## 2018-07-06 DIAGNOSIS — Z6841 Body Mass Index (BMI) 40.0 and over, adult: Secondary | ICD-10-CM | POA: Diagnosis not present

## 2018-07-06 DIAGNOSIS — M069 Rheumatoid arthritis, unspecified: Secondary | ICD-10-CM | POA: Diagnosis not present

## 2018-07-06 DIAGNOSIS — M549 Dorsalgia, unspecified: Secondary | ICD-10-CM | POA: Diagnosis not present

## 2018-07-06 DIAGNOSIS — Z881 Allergy status to other antibiotic agents status: Secondary | ICD-10-CM

## 2018-07-06 DIAGNOSIS — E7800 Pure hypercholesterolemia, unspecified: Secondary | ICD-10-CM | POA: Diagnosis not present

## 2018-07-06 LAB — CBC WITH DIFFERENTIAL/PLATELET
Basophils Absolute: 0.2 10*3/uL — ABNORMAL HIGH (ref 0.0–0.1)
Basophils Relative: 1 %
EOS ABS: 3.1 10*3/uL — AB (ref 0.0–0.7)
EOS PCT: 14 %
HCT: 38.9 % (ref 36.0–46.0)
HEMOGLOBIN: 12.2 g/dL (ref 12.0–15.0)
LYMPHS ABS: 1.5 10*3/uL (ref 0.7–4.0)
Lymphocytes Relative: 7 %
MCH: 24.4 pg — AB (ref 26.0–34.0)
MCHC: 31.4 g/dL (ref 30.0–36.0)
MCV: 77.6 fL — ABNORMAL LOW (ref 78.0–100.0)
Monocytes Absolute: 0.7 10*3/uL (ref 0.1–1.0)
Monocytes Relative: 3 %
NEUTROS PCT: 75 %
Neutro Abs: 16.6 10*3/uL — ABNORMAL HIGH (ref 1.7–7.7)
Platelets: ADEQUATE 10*3/uL (ref 150–400)
RBC: 5.01 MIL/uL (ref 3.87–5.11)
RDW: 19.2 % — ABNORMAL HIGH (ref 11.5–15.5)
WBC: 22.1 10*3/uL — AB (ref 4.0–10.5)

## 2018-07-06 LAB — COMPREHENSIVE METABOLIC PANEL
ALBUMIN: 2.4 g/dL — AB (ref 3.5–5.0)
ALT: 10 U/L (ref 0–44)
AST: 11 U/L — ABNORMAL LOW (ref 15–41)
Alkaline Phosphatase: 64 U/L (ref 38–126)
Anion gap: 8 (ref 5–15)
BILIRUBIN TOTAL: 0.6 mg/dL (ref 0.3–1.2)
BUN: 18 mg/dL (ref 8–23)
CO2: 28 mmol/L (ref 22–32)
Calcium: 8.2 mg/dL — ABNORMAL LOW (ref 8.9–10.3)
Chloride: 103 mmol/L (ref 98–111)
Creatinine, Ser: 0.9 mg/dL (ref 0.44–1.00)
GFR calc non Af Amer: >60 mL/min (ref >60)
GLUCOSE: 224 mg/dL — AB (ref 70–99)
POTASSIUM: 3.7 mmol/L (ref 3.5–5.1)
SODIUM: 139 mmol/L (ref 135–145)
TOTAL PROTEIN: 5.1 g/dL — AB (ref 6.5–8.1)

## 2018-07-06 LAB — URINALYSIS, ROUTINE W REFLEX MICROSCOPIC
Glucose, UA: NEGATIVE mg/dL
HGB URINE DIPSTICK: NEGATIVE
Ketones, ur: 15 mg/dL — AB
Nitrite: POSITIVE — AB
Protein, ur: NEGATIVE mg/dL
Specific Gravity, Urine: 1.025 (ref 1.005–1.030)
pH: 6 (ref 5.0–8.0)

## 2018-07-06 LAB — URINALYSIS, MICROSCOPIC (REFLEX)

## 2018-07-06 LAB — BASIC METABOLIC PANEL
ANION GAP: 7 (ref 5–15)
BUN: 20 mg/dL (ref 8–23)
CALCIUM: 8.2 mg/dL — AB (ref 8.9–10.3)
CO2: 29 mmol/L (ref 22–32)
CREATININE: 0.9 mg/dL (ref 0.44–1.00)
Chloride: 104 mmol/L (ref 98–111)
Glucose, Bld: 183 mg/dL — ABNORMAL HIGH (ref 70–99)
Potassium: 3.8 mmol/L (ref 3.5–5.1)
SODIUM: 140 mmol/L (ref 135–145)

## 2018-07-06 LAB — CBC
HCT: 39.1 % (ref 36.0–46.0)
Hemoglobin: 11.7 g/dL — ABNORMAL LOW (ref 12.0–15.0)
MCH: 24 pg — ABNORMAL LOW (ref 26.0–34.0)
MCHC: 29.9 g/dL — ABNORMAL LOW (ref 30.0–36.0)
MCV: 80.1 fL (ref 78.0–100.0)
Platelets: 217 10*3/uL (ref 150–400)
RBC: 4.88 MIL/uL (ref 3.87–5.11)
RDW: 18.7 % — AB (ref 11.5–15.5)
WBC: 22.2 10*3/uL — AB (ref 4.0–10.5)

## 2018-07-06 LAB — TROPONIN I

## 2018-07-06 LAB — CK: Total CK: 12 U/L — ABNORMAL LOW (ref 38–234)

## 2018-07-06 LAB — C-REACTIVE PROTEIN: CRP: 2.8 mg/dL — AB (ref <1.0)

## 2018-07-06 LAB — MRSA PCR SCREENING: MRSA BY PCR: POSITIVE — AB

## 2018-07-06 LAB — GLUCOSE, CAPILLARY
GLUCOSE-CAPILLARY: 168 mg/dL — AB (ref 70–99)
GLUCOSE-CAPILLARY: 132 mg/dL — AB (ref 70–99)
Glucose-Capillary: 165 mg/dL — ABNORMAL HIGH (ref 70–99)
Glucose-Capillary: 194 mg/dL — ABNORMAL HIGH (ref 70–99)

## 2018-07-06 LAB — SEDIMENTATION RATE: SED RATE: 15 mm/h (ref 0–22)

## 2018-07-06 MED ORDER — PANTOPRAZOLE SODIUM 40 MG PO TBEC
40.0000 mg | DELAYED_RELEASE_TABLET | Freq: Every day | ORAL | Status: DC
  Administered 2018-07-06: 40 mg via ORAL
  Filled 2018-07-06: qty 1

## 2018-07-06 MED ORDER — INSULIN ASPART 100 UNIT/ML ~~LOC~~ SOLN
0.0000 [IU] | Freq: Three times a day (TID) | SUBCUTANEOUS | Status: DC
  Administered 2018-07-06: 2 [IU] via SUBCUTANEOUS
  Administered 2018-07-06: 1 [IU] via SUBCUTANEOUS
  Administered 2018-07-06: 2 [IU] via SUBCUTANEOUS
  Administered 2018-07-07 (×2): 1 [IU] via SUBCUTANEOUS

## 2018-07-06 MED ORDER — ACETAMINOPHEN 325 MG PO TABS
ORAL_TABLET | ORAL | Status: AC
  Filled 2018-07-06: qty 2

## 2018-07-06 MED ORDER — HYDROCODONE-ACETAMINOPHEN 7.5-325 MG PO TABS
1.0000 | ORAL_TABLET | ORAL | Status: DC | PRN
  Administered 2018-07-06 (×2): 2 via ORAL
  Administered 2018-07-06 – 2018-07-07 (×2): 1 via ORAL
  Filled 2018-07-06: qty 1
  Filled 2018-07-06 (×2): qty 2
  Filled 2018-07-06: qty 1
  Filled 2018-07-06: qty 2

## 2018-07-06 MED ORDER — MONTELUKAST SODIUM 10 MG PO TABS
10.0000 mg | ORAL_TABLET | Freq: Every day | ORAL | Status: DC
  Administered 2018-07-06: 10 mg via ORAL
  Filled 2018-07-06 (×2): qty 1

## 2018-07-06 MED ORDER — ONDANSETRON HCL 4 MG/2ML IJ SOLN: 4.0000 mg | Freq: Four times a day (QID) | INTRAMUSCULAR | Status: DC | PRN

## 2018-07-06 MED ORDER — PIPERACILLIN-TAZOBACTAM 3.375 G IVPB
3.3750 g | Freq: Three times a day (TID) | INTRAVENOUS | Status: DC
  Administered 2018-07-06 (×2): 3.375 g via INTRAVENOUS
  Filled 2018-07-06 (×2): qty 50

## 2018-07-06 MED ORDER — PIPERACILLIN-TAZOBACTAM 3.375 G IVPB 30 MIN
3.3750 g | Freq: Once | INTRAVENOUS | Status: AC
  Administered 2018-07-06: 3.375 g via INTRAVENOUS
  Filled 2018-07-06 (×2): qty 50

## 2018-07-06 MED ORDER — ENOXAPARIN SODIUM 40 MG/0.4ML ~~LOC~~ SOLN
40.0000 mg | SUBCUTANEOUS | Status: DC
  Administered 2018-07-06 – 2018-07-07 (×2): 40 mg via SUBCUTANEOUS
  Filled 2018-07-06 (×2): qty 0.4

## 2018-07-06 MED ORDER — ASPIRIN EC 81 MG PO TBEC
81.0000 mg | DELAYED_RELEASE_TABLET | Freq: Every day | ORAL | Status: DC
  Administered 2018-07-06 – 2018-07-07 (×2): 81 mg via ORAL
  Filled 2018-07-06 (×2): qty 1

## 2018-07-06 MED ORDER — ACETAMINOPHEN 650 MG RE SUPP: 650.0000 mg | Freq: Four times a day (QID) | RECTAL | Status: DC | PRN

## 2018-07-06 MED ORDER — ACETAMINOPHEN 325 MG PO TABS: 650.0000 mg | ORAL_TABLET | Freq: Four times a day (QID) | ORAL | Status: DC | PRN

## 2018-07-06 MED ORDER — ENOXAPARIN SODIUM 40 MG/0.4ML ~~LOC~~ SOLN: 40.0000 mg | SUBCUTANEOUS | Status: DC

## 2018-07-06 MED ORDER — DIPHENHYDRAMINE HCL 50 MG/ML IJ SOLN
25.0000 mg | Freq: Two times a day (BID) | INTRAMUSCULAR | Status: DC
  Administered 2018-07-06: 25 mg via INTRAVENOUS
  Filled 2018-07-06: qty 1

## 2018-07-06 MED ORDER — VANCOMYCIN HCL 10 G IV SOLR: 1500.0000 mg | INTRAVENOUS | Status: DC

## 2018-07-06 MED ORDER — MOMETASONE FURO-FORMOTEROL FUM 200-5 MCG/ACT IN AERO
2.0000 | INHALATION_SPRAY | Freq: Two times a day (BID) | RESPIRATORY_TRACT | Status: DC
  Administered 2018-07-06 – 2018-07-07 (×2): 2 via RESPIRATORY_TRACT
  Filled 2018-07-06 (×2): qty 8.8

## 2018-07-06 MED ORDER — SODIUM CHLORIDE 0.9 % IV SOLN
INTRAVENOUS | Status: DC | PRN
  Administered 2018-07-06: 500 mL via INTRAVENOUS

## 2018-07-06 MED ORDER — INSULIN GLARGINE 100 UNIT/ML ~~LOC~~ SOLN
34.0000 [IU] | Freq: Every day | SUBCUTANEOUS | Status: DC
  Administered 2018-07-06: 34 [IU] via SUBCUTANEOUS
  Filled 2018-07-06 (×3): qty 0.34

## 2018-07-06 MED ORDER — ONDANSETRON HCL 4 MG PO TABS: 4.0000 mg | ORAL_TABLET | Freq: Four times a day (QID) | ORAL | Status: DC | PRN

## 2018-07-06 MED ORDER — SODIUM CHLORIDE 0.9 % IV SOLN
INTRAVENOUS | Status: DC
  Administered 2018-07-06: 05:00:00 via INTRAVENOUS

## 2018-07-06 MED ORDER — VANCOMYCIN HCL IN DEXTROSE 1-5 GM/200ML-% IV SOLN
1000.0000 mg | Freq: Once | INTRAVENOUS | Status: AC
  Administered 2018-07-06: 1000 mg via INTRAVENOUS
  Filled 2018-07-06: qty 200

## 2018-07-06 MED ORDER — DIPHENHYDRAMINE HCL 50 MG/ML IJ SOLN
25.0000 mg | Freq: Three times a day (TID) | INTRAMUSCULAR | Status: DC
  Administered 2018-07-06 – 2018-07-07 (×3): 25 mg via INTRAVENOUS
  Filled 2018-07-06 (×3): qty 1

## 2018-07-06 MED ORDER — DIPHENHYDRAMINE HCL 25 MG PO CAPS: 25.0000 mg | ORAL_CAPSULE | Freq: Four times a day (QID) | ORAL | Status: DC | PRN

## 2018-07-06 MED ORDER — HYDROCODONE-ACETAMINOPHEN 7.5-325 MG PO TABS
1.0000 | ORAL_TABLET | Freq: Four times a day (QID) | ORAL | Status: DC | PRN
  Filled 2018-07-06: qty 1

## 2018-07-06 MED ORDER — IPRATROPIUM-ALBUTEROL 0.5-2.5 (3) MG/3ML IN SOLN: 3.0000 mL | RESPIRATORY_TRACT | Status: DC | PRN

## 2018-07-06 NOTE — Plan of Care (Signed)
Pt alert and oriented, resting with pain well controlled.  States legs are feeling better today.  Zosyn and benadryl given per MD order. RN will monitor.

## 2018-07-06 NOTE — Progress Notes (Signed)
Kelli Rivera is a 67 y.o. female with medical history significant of HLD, DM type II, chronic leukocytosis, collagen vascular disease, and COPD; who presents with fatigue and new red patches/skin eruptions affecting hands and legs symmetrically.  Lesions are very itchy and non-blanchable.  To note, patient was on methotrexate and prednisone at home. On presentation afebrile, leukocytosis with WBC of 22 K, heart rate 119, respiration rate 24.  Unclear etiology of the rash. Consulted ID to further assess.  Please refer to H&P dictated by Dr. Katrinka Blazing on 07/06/2018 for further details of the assessment and plan.

## 2018-07-06 NOTE — ED Notes (Signed)
Pt transferred during computer down time. See paper chart.

## 2018-07-06 NOTE — Progress Notes (Signed)
Pharmacy Antibiotic Note  Kelli Rivera is a 67 y.o. female admitted on 07/05/2018 with medical history significant of HLD, DM type II, chronic leukocytosis, collagen vascular disease, and COPD; who presents with fatigue and new red patches to the bilateral legs and and hands over the last week and a half Pharmacy has been consulted for vancomycin and zosyn for cellulitis.  Plan: -Vancomycin 1gm x 1 [facility]) then 1500mg  Q36h (AUC 436.6, Scr 0.9), will start in 24h in lieu of load - zosyn 3.375 x 1 [facility]) then continue q8h EI - daily Scr - follow renal function, cultures and clinical course  Height: 5\' 3"  (160 cm) Weight: 254 lb (115.2 kg) IBW/kg (Calculated) : 52.4  Temp (24hrs), Avg:98.5 F (36.9 C), Min:98.1 F (36.7 C), Max:98.9 F (37.2 C)  Recent Labs  Lab 07/05/18 2327 07/05/18 2359  WBC 22.1*  --   CREATININE  --  0.90    Estimated Creatinine Clearance: 73.2 mL/min (by C-G formula based on SCr of 0.9 mg/dL).    Allergies  Allergen Reactions  . Liraglutide Nausea And Vomiting  . Pioglitazone Swelling  . Amoxicillin-Pot Clavulanate Nausea And Vomiting    Antimicrobials this admission: 9/12 vanc >> 9/12 zosyn >>  Dose adjustments this admission:   Microbiology results: 9/12 BCx:   Thank you for allowing pharmacy to be a part of this patient's care.  11/12 RPh 07/06/2018, 7:30 AM Pager [phone]

## 2018-07-06 NOTE — Consult Note (Signed)
[facility]    Date of Admission:  07/05/2018           Day 1 vancomycin        Day 1 piperacillin tazobactam       Reason for Consult: Rash    Referring Provider: Dr. Raphael Gibney Primary Care Provider: Dr. Pollyann Glen  Assessment: I do not think that there is one simple explanation for all of her acute illness.  I suspect that some of her recent fatigue is due to cyclobenzaprine.  When she became fatigued and stopped taking her furosemide her lower extremity venous insufficiency got worse.  I believe that is causing the shiny red skin on her feet and legs.  I do not think she has infectious cellulitis.  Simultaneous, bilateral cellulitis would be quite unusual.  She notes some improvement in the swelling, pain and redness today but I suspect that that is due to being in bed with her legs elevated.  I do not know what is causing the palmar erythema.  The peeling on her hands and left feet are chronic and unrelated to her acute illness.  She does not have a symptomatic urinary tract infection.  I see no need to continue broad empiric antibiotic therapy.  I will follow with you.  Plan: 1. Discontinue vancomycin and piperacillin tazobactam  Active Problems:   Chronic dermatitis   Venous insufficiency of both lower extremities   Palmar erythema   Leukocytosis   Eosinophilia   Morbid obesity (HCC)   COPD (chronic obstructive pulmonary disease) (HCC)   Hyperlipidemia   Diabetes mellitus type 2 in obese (HCC)   GERD (gastroesophageal reflux disease)   RA (rheumatoid arthritis) (HCC)   DJD (degenerative joint disease)   OSA (obstructive sleep apnea)   Scheduled Meds: . aspirin EC  81 mg Oral Daily  . diphenhydrAMINE  25 mg Intravenous TID  . enoxaparin (LOVENOX) injection  40 mg Subcutaneous Q24H  . insulin aspart  0-9 Units Subcutaneous TID WC  . insulin glargine  34 Units Subcutaneous QHS  . mometasone-formoterol  2 puff Inhalation BID  .  montelukast  10 mg Oral QHS  . pantoprazole  40 mg Oral QHS   Continuous Infusions: . sodium chloride Stopped (07/06/18 0108)  . piperacillin-tazobactam (ZOSYN)  IV 3.375 g (07/06/18 1528)  . [START ON 07/07/2018] vancomycin     PRN Meds:.sodium chloride, acetaminophen **OR** acetaminophen, HYDROcodone-acetaminophen, ipratropium-albuterol, ondansetron **OR** ondansetron (ZOFRAN) IV  HPI: Kelli Rivera is a 67 y.o. female with multiple medical problems.  She saw her PCP on 06/30/2018 complaining of worsening back pain.  She was given a Toradol injection and started on cyclobenzaprine.  She noted that she became quite fatigued shortly after that visit.  She says that she was so tired she did not feel like getting up to take her medications.  She has chronic venous insufficiency of both lower extremities and takes furosemide as needed for fluid retention and leg swelling.  She did not take any furosemide and noted increased swelling of her feet and legs recently.  She has chronic dermatitis of her left foot and has been treated with nystatin cream in the past.  She also has chronic recurrent peeling of both hands.    In addition to the fatigue and leg swelling she began to notice discomfort and redness around both ankles.  She is also developed some palmar erythema.  She has had all of these  problems intermittently in the past.  She was admitted in November 2017 with a similar but less dramatic illness.  At that time, they initially felt like she had lower extremity cellulitis but change their mind during the admission.  She has chronic leukocytosis of unknown cause and I note that she also has chronic eosinophilia.  She has not had any fever, chills or sweats.  Upon admission last night she was felt to have cellulitis.  There was also concern for a urinary tract infection because of some pyuria and bacteriuria on urinalysis.  However, she does not have any symptoms suggestive of a urinary tract  infection.   Review of Systems: Review of Systems  Constitutional: Positive for malaise/fatigue. Negative for chills, diaphoresis and fever.  Gastrointestinal: Negative for abdominal pain, diarrhea, nausea and vomiting.  Genitourinary: Negative for dysuria, frequency and urgency.  Musculoskeletal: Positive for back pain and joint pain.  Skin: Positive for itching and rash.    Past Medical History:  Diagnosis Date  . Asthma   . Collagen vascular disease (HCC)   . COPD (chronic obstructive pulmonary disease) (HCC)    O2 increased to 3L while ambulating, 2L at rest as of last week  . Diabetes mellitus without complication (HCC)   . High cholesterol   . History of leukocytosis    negative bone marrow, followed by heme q81mo  . Morbid obesity (HCC)     Social History   Tobacco Use  . Smoking status: Former Smoker    Packs/day: 1.00    Years: 42.00    Pack years: 42.00    Last attempt to quit: 2007    Years since quitting: 12.7  . Smokeless tobacco: Never Used  Substance Use Topics  . Alcohol use: No  . Drug use: No    Family History  Problem Relation Age of Onset  . Heart failure Mother 59  . Lung cancer Father 23   Allergies  Allergen Reactions  . Liraglutide Nausea And Vomiting  . Pioglitazone Swelling  . Amoxicillin-Pot Clavulanate Nausea And Vomiting    OBJECTIVE: Blood pressure 127/62, pulse 97, temperature 98.6 F (37 C), temperature source Oral, resp. rate 16, height 5\' 3"  (1.6 m), weight 115.2 kg, SpO2 93 %.  Physical Exam  Constitutional: She is oriented to person, place, and time.  She is resting comfortably in bed.  She is very pleasant and in no distress.  She is visiting with her daughter and granddaughter.  Eyes: Conjunctivae are normal.  Musculoskeletal:  She has pitting edema of both lower legs.  She has shiny red skin over her ankles and dorsum of her feet bilaterally. There is some very mild redness on her inner thighs.  She also has some  palmar erythema  Neurological: She is alert and oriented to person, place, and time.  Skin:  She has very superficial desquamation of both hands which she states is chronic and recurrent.  She has peeling skin on the lateral side of her left feet at the base of her toes which is also chronic.  Psychiatric: She has a normal mood and affect.    Lab Results Lab Results  Component Value Date   WBC 22.1 (H) 07/05/2018   HGB 12.2 07/05/2018   HCT 38.9 07/05/2018   MCV 77.6 (L) 07/05/2018   PLT  07/05/2018    PLATELET CLUMPS NOTED ON SMEAR, COUNT APPEARS ADEQUATE    Lab Results  Component Value Date   CREATININE 0.90 07/05/2018   BUN 18  07/05/2018   NA 139 07/05/2018   K 3.7 07/05/2018   CL 103 07/05/2018   CO2 28 07/05/2018    Lab Results  Component Value Date   ALT 10 07/05/2018   AST 11 (L) 07/05/2018   ALKPHOS 64 07/05/2018   BILITOT 0.6 07/05/2018     Microbiology: Recent Results (from the past 240 hour(s))  MRSA PCR Screening     Status: Abnormal   Collection Time: 07/06/18  8:07 AM  Result Value Ref Range Status   MRSA by PCR POSITIVE (A) NEGATIVE Final    Comment:        The GeneXpert MRSA Assay (FDA approved for NASAL specimens only), is one component of a comprehensive MRSA colonization surveillance program. It is not intended to diagnose MRSA infection nor to guide or monitor treatment for MRSA infections. RESULT CALLED TO, READ BACK BY AND VERIFIED WITH: R MARTIN,RN 07/06/18 1102 RHOLMES Performed at [facility], [address], [place], [place] [address]     Cliffton Asters, MD [facility] [phone] pager   [phone] cell 07/06/2018, 5:05 PM

## 2018-07-06 NOTE — H&P (Signed)
History and Physical    Kelli Rivera DPO:[id number] DOB: 1951/05/25 DOA: 07/05/2018  Referring MD/NP/PA: Leonie Man, MD PCP: [name], MD  Patient coming from:   Chief Complaint: Fatigue and rash  I have personally briefly reviewed patient's old medical records in [place]   HPI: Kelli Rivera is a 67 y.o. female with medical history significant of HLD, DM type II, chronic leukocytosis, collagen vascular disease, and COPD; who presents with fatigue and new red patches to the bilateral legs and and hands over the last week and a half.  The affected areas where the rash is present constantly itch and cause a burning pain.  Nothing has helped relieve symptoms.  Denies having any significant fever, chills, nausea, vomiting, diarrhea, abdominal pain, or newly started medications.  She reports having similar rash of the bilateral extremities for which she was told with cellulitis and had improvement with treatment with antibiotics.  However, patient reports symptoms never grown her hands before.  Associated symptoms include feeling generally weak, tired, generalized body aches, and is chronically short of breath.  ED Course: Upon admission to the emergency department patient was seen to be afebrile, heart rates 108-119, respirations 19-24, and all other vital signs maintained. Labs revealed WBC 22.1 and glucose 224 without elevated anion gap. Chest x-ray showed no acute abnormalities. Urinalysis was positive for signs of infection. Patient was started on empiric antibiotics of vancomycin and Zosyn for presumed cellulitis.  Review of Systems  Constitutional: Positive for malaise/fatigue. Negative for chills and fever.  HENT: Negative for ear discharge and nosebleeds.   Eyes: Negative for photophobia and pain.  Respiratory: Positive for shortness of breath. Negative for hemoptysis.   Cardiovascular: Negative for chest pain and leg swelling.  Gastrointestinal: Negative for  abdominal pain, nausea and vomiting.  Genitourinary: Positive for frequency. Negative for flank pain.  Musculoskeletal: Positive for myalgias. Negative for falls.  Skin: Positive for itching and rash.  Neurological: Positive for weakness. Negative for focal weakness and loss of consciousness.  Psychiatric/Behavioral: Negative for substance abuse. The patient is not nervous/anxious.     Past Medical History:  Diagnosis Date  . Asthma   . Collagen vascular disease ([place])   . COPD (chronic obstructive pulmonary disease) (HCC)    O2 increased to 3L while ambulating, 2L at rest as of last week  . Diabetes mellitus without complication ([place])   . High cholesterol   . History of leukocytosis    negative bone marrow, followed by heme q78mo . Morbid obesity ([place]     Past Surgical History:  Procedure Laterality Date  . ABDOMINAL HYSTERECTOMY    . APPENDECTOMY     age [age]yrs  . bladder tuck    . CHOLECYSTECTOMY       reports that she quit smoking about 12 years ago. She has a 42.00 pack-year smoking history. She has never used smokeless tobacco. She reports that she does not drink alcohol or use drugs.  Allergies  Allergen Reactions  . Liraglutide Nausea And Vomiting  . Pioglitazone Swelling  . Amoxicillin-Pot Clavulanate Nausea And Vomiting    Family History  Problem Relation Age of Onset  . Heart failure Mother 496 . Lung cancer Father 61   Prior to Admission medications   Medication Sig Start Date End Date Taking? Authorizing Provider  albuterol (PROVENTIL HFA;VENTOLIN HFA) 108 (90 Base) MCG/ACT inhaler Inhale 2 puffs into the lungs every 4 (four) hours as needed for wheezing or  shortness of breath. 12/17/15   [provider]  aspirin EC 81 MG tablet Take 81 mg by mouth daily.    [provider]  benzonatate (TESSALON) 200 MG capsule Take 1 capsule (200 mg total) by mouth 3 (three) times daily as needed for cough. 10/01/17   Ghimire, Henreitta Leber, MD    chlorpheniramine-HYDROcodone (TUSSIONEX) 10-8 MG/5ML SUER Take 5 mLs by mouth every 12 (twelve) hours as needed for cough. 10/01/17   Ghimire, Henreitta Leber, MD  fluticasone (FLONASE) 50 MCG/ACT nasal spray Place 2 sprays into the nose daily.    [provider]  Fluticasone-Salmeterol (ADVAIR DISKUS) 500-50 MCG/DOSE AEPB Inhale 1 puff into the lungs every 12 (twelve) hours.    [provider]  folic acid (FOLVITE) 1 MG tablet Take 1 mg by mouth at bedtime.    [provider]  furosemide (LASIX) 40 MG tablet Take 1 tablet (40 mg total) by mouth daily. 10/01/17   Ghimire, Henreitta Leber, MD  guaiFENesin (MUCINEX) 600 MG 12 hr tablet Take 2 tablets (1,200 mg total) by mouth 2 (two) times daily. 10/01/17   Ghimire, Henreitta Leber, MD  ibuprofen (ADVIL,MOTRIN) 800 MG tablet Take 800 mg by mouth every 8 (eight) hours as needed for headache, mild pain or moderate pain.  09/07/17   [provider]  ipratropium-albuterol (DUONEB) 0.5-2.5 (3) MG/3ML SOLN Take 3 mLs by nebulization 4 (four) times daily. 10/01/17   Ghimire, Henreitta Leber, MD  metFORMIN (GLUCOPHAGE) 1000 MG tablet Take 1,000 mg by mouth at bedtime.     [provider]  methotrexate (50 MG/ML) 1 g injection Inject 6 mg into the vein every Saturday.    [provider]  mirabegron ER (MYRBETRIQ) 50 MG TB24 tablet Take 50 mg by mouth daily. 05/24/16   [provider]  montelukast (SINGULAIR) 10 MG tablet Take 10 mg by mouth at bedtime. 08/18/15   [provider]  omeprazole (PRILOSEC) 40 MG capsule Take 40 mg by mouth at bedtime.  08/31/16   [provider]  predniSONE (DELTASONE) 10 MG tablet Take 4 tablets (40 mg) daily for 2 days, then, Take 3 tablets (30 mg) daily for 2 days, then, Take 2 tablets (20 mg) daily for 2 days, then, Take 1 tablets (10 mg) daily for 1 days, then stop 10/01/17   Ghimire, Henreitta Leber, MD  tiZANidine (ZANAFLEX) 4 MG tablet Take 4 mg by mouth every 8 (eight) hours as  needed for muscle spasms.  09/13/16   [provider]  TOUJEO SOLOSTAR 300 UNIT/ML SOPN Inject 34 Units into the skin at bedtime.  08/31/16   [provider]  traMADol (ULTRAM) 50 MG tablet Take 50 mg by mouth 3 (three) times daily as needed for moderate pain or severe pain.  08/16/16   [provider]  TRULICITY 1.5 CH/8.5ID SOPN Inject 1.5 mg into the skin every Thursday. 09/19/17   [provider]    Physical Exam:  Constitutional: Obese female in NAD, calm, comfortable Vitals:   07/05/18 2248 07/05/18 2250 07/06/18 0035  BP: 113/61  134/65  Pulse: (!) 119  (!) 108  Resp: (!) 24  19  Temp: 98.9 F (37.2 C)    TempSrc: Oral    SpO2: 91%  93%  Weight:  115.2 kg   Height:  '5\' 3"'$  (1.6 m)    Eyes: PERRL, lids and conjunctivae normal ENMT: Mucous membranes are moist. Posterior pharynx clear of any exudate or lesions.Normal dentition.  Neck: normal, supple,  no masses, no thyromegaly Respiratory: clear to auscultation bilaterally, no wheezing, no crackles. Normal respiratory effort. No accessory muscle use.  Cardiovascular: Regular rate and rhythm, no murmurs / rubs / gallops. No extremity edema. 2+ pedal pulses. No carotid bruits.  Abdomen: no tenderness, no masses palpated. No hepatosplenomegaly. Bowel sounds positive.  Musculoskeletal: no clubbing / cyanosis. No joint deformity upper and lower extremities. Good ROM, no contractures. Normal muscle tone.  Skin: Patient with erythematous appearing papular-like rash of the bilateral shins and palms. Neurologic: CN 2-12 grossly intact. Sensation intact, DTR normal. Strength 5/5 in all 4.  Psychiatric: Normal judgment and insight. Alert and oriented x 3. Normal mood.     Labs on Admission: I have personally reviewed following labs and imaging studies  CBC: Recent Labs  Lab 07/05/18 2327  WBC 22.1*  NEUTROABS 16.6*  HGB 12.2  HCT 38.9  MCV 77.6*  PLT PLATELET CLUMPS NOTED ON SMEAR, COUNT APPEARS  ADEQUATE   Basic Metabolic Panel: Recent Labs  Lab 07/05/18 2359  NA 139  K 3.7  CL 103  CO2 28  GLUCOSE 224*  BUN 18  CREATININE 0.90  CALCIUM 8.2*   GFR: Estimated Creatinine Clearance: 73.2 mL/min (by C-G formula based on SCr of 0.9 mg/dL). Liver Function Tests: Recent Labs  Lab 07/05/18 2359  AST 11*  ALT 10  ALKPHOS 64  BILITOT 0.6  PROT 5.1*  ALBUMIN 2.4*   No results for input(s): LIPASE, AMYLASE in the last 168 hours. No results for input(s): AMMONIA in the last 168 hours. Coagulation Profile: No results for input(s): INR, PROTIME in the last 168 hours. Cardiac Enzymes: Recent Labs  Lab 07/05/18 2327  TROPONINI <0.03   BNP (last 3 results) No results for input(s): PROBNP in the last 8760 hours. HbA1C: No results for input(s): HGBA1C in the last 72 hours. CBG: No results for input(s): GLUCAP in the last 168 hours. Lipid Profile: No results for input(s): CHOL, HDL, LDLCALC, TRIG, CHOLHDL, LDLDIRECT in the last 72 hours. Thyroid Function Tests: No results for input(s): TSH, T4TOTAL, FREET4, T3FREE, THYROIDAB in the last 72 hours. Anemia Panel: No results for input(s): VITAMINB12, FOLATE, FERRITIN, TIBC, IRON, RETICCTPCT in the last 72 hours. Urine analysis:    Component Value Date/Time   COLORURINE YELLOW 07/05/2018 2327   APPEARANCEUR HAZY (A) 07/05/2018 2327   LABSPEC 1.025 07/05/2018 2327   PHURINE 6.0 07/05/2018 2327   GLUCOSEU NEGATIVE 07/05/2018 2327   HGBUR NEGATIVE 07/05/2018 2327   BILIRUBINUR SMALL (A) 07/05/2018 2327   KETONESUR 15 (A) 07/05/2018 2327   PROTEINUR NEGATIVE 07/05/2018 2327   NITRITE POSITIVE (A) 07/05/2018 2327   LEUKOCYTESUR TRACE (A) 07/05/2018 2327   Sepsis Labs: No results found for this or any previous visit (from the past 240 hour(s)).   Radiological Exams on Admission: Dg Chest 2 View  Result Date: 07/06/2018 CLINICAL DATA:  Redness and swelling of legs for 2 days, weakness, pain in BILATERAL hands,  generalized body aches, history of cellulitis, diabetes mellitus, COPD, asthma, former smoker EXAM: CHEST - 2 VIEW COMPARISON:  09/24/2017 FINDINGS: Normal heart size, mediastinal contours, and pulmonary vascularity. Atherosclerotic calcifications aorta. Lungs clear. No pleural effusion or pneumothorax. Bones unremarkable. IMPRESSION: No acute abnormalities. Electronically Signed   By: Lavonia Dana M.D.   On: 07/06/2018 00:27    EKG: Independently reviewed.  Sinus tachycardia at 110 bpm  Assessment/Plan Question cellulitis vs. rash: Acute.  Patient presents with a erythematous appearing patches of the bilateral anterior shins and hands  with papules present.  Question if infection or manifestation of other underlying process. - Admit to a MedSurg bed  - Follow-up blood cultures - Checking ESR and CRP - Continue antibiotics of vancomycin and Zosyn   UTI : Acute.  Patient found to have trace leukocytes with positive nitrites and many bacteria on urinalysis.  No urine culture was initially obtained. - Check urine culture  - Continue antibiotics as seen above    Generalized weakness: Unclear cause of symptoms at this time.  Leukocytosis: Acute on chronic.  Patient presents with a white blood cell count of 22.1.  Previous history of chronically elevated white blood cell count for which she was evaluated by hematology and had negative bone marrow biopsy.   - Recheck WBC in a.m.  Diabetes mellitus type 2: Last hemoglobin A1c available in care everywhere noted to be 6.5 on 03/03/2018. - Hypoglycemic protocols - Held metformin - Continue home regimen of insulin glargine 34 units subcutaneously nightly - CBGs q. before meals with sensitive SSI  COPD, without acute exacerbation: At baseline patient on 2 L nasal cannula oxygen. - Continue pharmacy substitution of Dulera and DuoNeb breathing treatments as needed  GERD - Continue omeprazole  Morbid obesity: BMI 44.99  DVT prophylaxis: lovenox Code  Status: Full Family Communication: No family present at bedside Disposition Plan: Likely discharge home in 1 to 2 days Consults called: none  Admission status: Observation  Norval Morton MD [facility] Pager [phone]   If 7PM-7AM, please contact night-coverage [email or web address] Password [facility]  07/06/2018, 12:59 AM

## 2018-07-06 NOTE — ED Notes (Signed)
Pt transported by [facility] at 0253.

## 2018-07-06 NOTE — Plan of Care (Signed)
67yo F with DM   WBC 20 and bilateral cellulitis body aches Started on zosyn and vanc  obs Med surge  Cinda Hara 12:49 AM

## 2018-07-07 DIAGNOSIS — I872 Venous insufficiency (chronic) (peripheral): Secondary | ICD-10-CM | POA: Diagnosis not present

## 2018-07-07 DIAGNOSIS — N3000 Acute cystitis without hematuria: Secondary | ICD-10-CM | POA: Diagnosis not present

## 2018-07-07 DIAGNOSIS — L0390 Cellulitis, unspecified: Secondary | ICD-10-CM

## 2018-07-07 DIAGNOSIS — L309 Dermatitis, unspecified: Secondary | ICD-10-CM | POA: Diagnosis not present

## 2018-07-07 DIAGNOSIS — L03119 Cellulitis of unspecified part of limb: Secondary | ICD-10-CM | POA: Diagnosis not present

## 2018-07-07 DIAGNOSIS — L538 Other specified erythematous conditions: Secondary | ICD-10-CM | POA: Diagnosis not present

## 2018-07-07 DIAGNOSIS — J449 Chronic obstructive pulmonary disease, unspecified: Secondary | ICD-10-CM

## 2018-07-07 DIAGNOSIS — D72829 Elevated white blood cell count, unspecified: Secondary | ICD-10-CM | POA: Diagnosis not present

## 2018-07-07 DIAGNOSIS — R531 Weakness: Secondary | ICD-10-CM | POA: Diagnosis not present

## 2018-07-07 LAB — COMPREHENSIVE METABOLIC PANEL
ALK PHOS: 73 U/L (ref 38–126)
ALT: 17 U/L (ref 0–44)
AST: 28 U/L (ref 15–41)
Albumin: 2.4 g/dL — ABNORMAL LOW (ref 3.5–5.0)
Anion gap: 9 (ref 5–15)
BILIRUBIN TOTAL: 0.5 mg/dL (ref 0.3–1.2)
BUN: 20 mg/dL (ref 8–23)
CALCIUM: 8.3 mg/dL — AB (ref 8.9–10.3)
CHLORIDE: 103 mmol/L (ref 98–111)
CO2: 28 mmol/L (ref 22–32)
CREATININE: 0.92 mg/dL (ref 0.44–1.00)
Glucose, Bld: 172 mg/dL — ABNORMAL HIGH (ref 70–99)
Potassium: 4.3 mmol/L (ref 3.5–5.1)
Sodium: 140 mmol/L (ref 135–145)
TOTAL PROTEIN: 5.1 g/dL — AB (ref 6.5–8.1)

## 2018-07-07 LAB — CBC
HCT: 39.8 % (ref 36.0–46.0)
Hemoglobin: 12 g/dL (ref 12.0–15.0)
MCH: 24.4 pg — ABNORMAL LOW (ref 26.0–34.0)
MCHC: 30.2 g/dL (ref 30.0–36.0)
MCV: 80.9 fL (ref 78.0–100.0)
PLATELETS: 236 10*3/uL (ref 150–400)
RBC: 4.92 MIL/uL (ref 3.87–5.11)
RDW: 19 % — AB (ref 11.5–15.5)
WBC: 23.5 10*3/uL — AB (ref 4.0–10.5)

## 2018-07-07 LAB — LACTIC ACID, PLASMA: Lactic Acid, Venous: 1.8 mmol/L (ref 0.5–1.9)

## 2018-07-07 LAB — GLUCOSE, CAPILLARY
GLUCOSE-CAPILLARY: 128 mg/dL — AB (ref 70–99)
GLUCOSE-CAPILLARY: 143 mg/dL — AB (ref 70–99)

## 2018-07-07 MED ORDER — DIPHENHYDRAMINE HCL 50 MG PO TABS: 25.0000 mg | ORAL_TABLET | Freq: Three times a day (TID) | ORAL | 0 refills | Status: DC

## 2018-07-07 MED ORDER — PREDNISONE 5 MG PO TABS: 5.0000 mg | ORAL_TABLET | Freq: Every day | ORAL | 0 refills | Status: DC

## 2018-07-07 NOTE — Progress Notes (Signed)
Patient ID: Kelli Rivera, female   DOB: 1951/01/23, 67 y.o.   MRN: [id number]         [facility]  Date of Admission:  07/05/2018      ASSESSMENT: I cannot see any evidence of treatable infection.  I am not sure what caused her palmar erythema but it is resolving.  I believe the swelling and redness of her ankles is due to acute on chronic venous insufficiency.  She has tried compressive stockings in the past but did not use them because they were uncomfortable and hard to put on.  She states that she is willing to try them again.  PLAN: 1. Recommend discharge home with follow-up by Dr. Riley Nearing 2. I will sign off for now  Active Problems:   Chronic dermatitis   Venous insufficiency of both lower extremities   Palmar erythema   Leukocytosis   Eosinophilia   Morbid obesity (HCC)   COPD (chronic obstructive pulmonary disease) (HCC)   Hyperlipidemia   Diabetes mellitus type 2 in obese (HCC)   GERD (gastroesophageal reflux disease)   RA (rheumatoid arthritis) (HCC)   DJD (degenerative joint disease)   OSA (obstructive sleep apnea)   Scheduled Meds: . aspirin EC  81 mg Oral Daily  . diphenhydrAMINE  25 mg Intravenous TID  . enoxaparin (LOVENOX) injection  40 mg Subcutaneous Q24H  . insulin aspart  0-9 Units Subcutaneous TID WC  . insulin glargine  34 Units Subcutaneous QHS  . mometasone-formoterol  2 puff Inhalation BID  . montelukast  10 mg Oral QHS  . pantoprazole  40 mg Oral QHS   Continuous Infusions: . sodium chloride Stopped (07/06/18 0108)   PRN Meds:.sodium chloride, acetaminophen **OR** acetaminophen, HYDROcodone-acetaminophen, ipratropium-albuterol, ondansetron **OR** ondansetron (ZOFRAN) IV   SUBJECTIVE: She is feeling better today.  She is not as fatigued.  The redness on her palms and lower legs has decreased and she is not having as much discomfort.  Review of Systems: Review of Systems  Constitutional: Positive for  malaise/fatigue. Negative for fever.  Cardiovascular: Positive for leg swelling.  Musculoskeletal: Positive for back pain and joint pain.    Allergies  Allergen Reactions  . Liraglutide Nausea And Vomiting  . Pioglitazone Swelling  . Amoxicillin-Pot Clavulanate Nausea And Vomiting    OBJECTIVE: Vitals:   07/06/18 1914 07/06/18 2228 07/07/18 0622 07/07/18 0827  BP:  111/61 (!) 107/54   Pulse:  92 98 (!) 110  Resp:  16 16 18   Temp:  98.1 F (36.7 C) 98.5 F (36.9 C)   TempSrc:  Oral Oral   SpO2: 93% 91% 91% 90%  Weight:      Height:       Body mass index is 44.99 kg/m.  Physical Exam  Constitutional: She is oriented to person, place, and time.  She appears to be feeling better.  She is sitting up in bed visiting with her daughter.  Musculoskeletal:  The swelling and redness in her lower legs is decreased.  She is not is tender to palpation.  Neurological: She is alert and oriented to person, place, and time.  Skin:  The redness on her palms is fading.  Psychiatric: She has a normal mood and affect.    Lab Results Lab Results  Component Value Date   WBC 23.5 (H) 07/07/2018   HGB 12.0 07/07/2018   HCT 39.8 07/07/2018   MCV 80.9 07/07/2018   PLT 236 07/07/2018    Lab Results  Component Value  Date   CREATININE 0.92 07/07/2018   BUN 20 07/07/2018   NA 140 07/07/2018   K 4.3 07/07/2018   CL 103 07/07/2018   CO2 28 07/07/2018    Lab Results  Component Value Date   ALT 17 07/07/2018   AST 28 07/07/2018   ALKPHOS 73 07/07/2018   BILITOT 0.5 07/07/2018     Microbiology: Recent Results (from the past 240 hour(s))  Blood culture (routine x 2)     Status: None (Preliminary result)   Collection Time: 07/06/18 12:25 AM  Result Value Ref Range Status   Specimen Description   Final    BLOOD RIGHT ANTECUBITAL Performed at [facility], [address] [place], [place], [place] [address]    Special Requests   Final    BOTTLES DRAWN AEROBIC AND ANAEROBIC Blood  Culture adequate volume Performed at [facility], [address] [place], [place] [address]    Culture   Final    NO GROWTH < 24 HOURS Performed at [facility], [address], [place], [place] [address]    Report Status PENDING  Incomplete  Blood culture (routine x 2)     Status: None (Preliminary result)   Collection Time: 07/06/18 12:30 AM  Result Value Ref Range Status   Specimen Description   Final    BLOOD LEFT FOREARM Performed at [facility], [address] [place], [place] [address]    Special Requests   Final    BOTTLES DRAWN AEROBIC AND ANAEROBIC Blood Culture adequate volume Performed at [facility], [address] [place], [place] [address]    Culture   Final    NO GROWTH < 24 HOURS Performed at [facility], [address], [place], [place] [address]    Report Status PENDING  Incomplete  Culture, Urine     Status: Abnormal (Preliminary result)   Collection Time: 07/06/18  6:05 AM  Result Value Ref Range Status   Specimen Description   Final    URINE, CLEAN CATCH Performed at [facility], [address] [place], [place] [address]    Special Requests   Final    NONE Performed at [facility], [address] [place], [place] [address]    Culture >=100,000 COLONIES/mL KLEBSIELLA PNEUMONIAE (A)  Final   Report Status PENDING  Incomplete  MRSA PCR Screening     Status: Abnormal   Collection Time: 07/06/18  8:07 AM  Result Value Ref Range Status   MRSA by PCR POSITIVE (A) NEGATIVE Final    Comment:        The GeneXpert MRSA Assay (FDA approved for NASAL specimens only), is one component of a comprehensive MRSA colonization surveillance program. It is not intended to diagnose MRSA infection nor to guide or monitor treatment for MRSA infections. RESULT CALLED TO, READ BACK BY AND VERIFIED WITH: R MARTIN,RN 07/06/18 1102 RHOLMES Performed at [facility], [address], [place], [place] [address]     Cliffton Asters, MD [facility] [phone] pager   [phone] cell 07/07/2018, 11:21 AM

## 2018-07-07 NOTE — Care Management Obs Status (Signed)
MEDICARE OBSERVATION STATUS NOTIFICATION   Patient Details  Name: Kelli Rivera MRN: [id number] Date of Birth: [birth year]   Medicare Observation Status Notification Given:  Yes    Bartholome Bill, RN 07/07/2018, 11:39 AM

## 2018-07-07 NOTE — Evaluation (Signed)
Physical Therapy Evaluation Patient Details Name: Kelli Rivera MRN: [id number] DOB: [birth year] Today's Date: 07/07/2018   History of Present Illness  Pt admitted with UTI and Bil LE cellulitis and hx of DM and COPD  Clinical Impression  Pt admitted as above and presenting with functional mobility limitations 2* generalized weakness, limited endurance, and knee pain.  Pt should progress to dc home but would benefit from follow up HHPT to further address deficits.    Follow Up Recommendations Home health PT    Equipment Recommendations  None recommended by PT    Recommendations for Other Services       Precautions / Restrictions Precautions Precautions: Fall Restrictions Weight Bearing Restrictions: No      Mobility  Bed Mobility               General bed mobility comments: NT - pt up in chair and declines assist back to bed  Transfers Overall transfer level: Needs assistance Equipment used: Rolling walker (2 wheeled) Transfers: Sit to/from Stand Sit to Stand: Min guard         General transfer comment: cues for use of UEs to self assist and for safe transition position on sitting  Ambulation/Gait Ambulation/Gait assistance: Min guard Gait Distance (Feet): 54 Feet Assistive device: Rolling walker (2 wheeled) Gait Pattern/deviations: Step-to pattern;Step-through pattern;Decreased step length - right;Decreased step length - left;Shuffle;Trunk flexed;Wide base of support Gait velocity: decr   General Gait Details: cues for posture and position from RW;  Min knee flex, increased time.  Distance ltd by R knee pain and fatigue  Stairs            Wheelchair Mobility    Modified Rankin (Stroke Patients Only)       Balance Overall balance assessment: Needs assistance Sitting-balance support: No upper extremity supported;Feet supported Sitting balance-Leahy Scale: Good     Standing balance support: No upper extremity supported Standing balance-Leahy  Scale: Fair                               Pertinent Vitals/Pain Pain Assessment: 0-10 Pain Score: 4  Pain Location: R knee with WB Pain Descriptors / Indicators: Aching;Sore Pain Intervention(s): Limited activity within patient's tolerance;Monitored during session    Home Living Family/patient expects to be discharged to:: Private residence Living Arrangements: Alone Available Help at Discharge: Available 24 hours/day;Family Type of Home: Apartment Home Access: Stairs to enter   [employer] of Steps: 1 Home Layout: One level Home Equipment: Cane - single point;Walker - 2 wheels;Shower seat Additional Comments: Pt states she is getting comode riser through [employer]    Prior Function Level of Independence: Independent with assistive device(s)         Comments: She reports daughter assists with grocery shopping as she has a difficult time carrying heavy items      Hand Dominance   Dominant Hand: Right    Extremity/Trunk Assessment   Upper Extremity Assessment Upper Extremity Assessment: Generalized weakness    Lower Extremity Assessment Lower Extremity Assessment: Generalized weakness       Communication   Communication: No difficulties  Cognition Arousal/Alertness: Awake/alert Behavior During Therapy: WFL for tasks assessed/performed Overall Cognitive Status: Within Functional Limits for tasks assessed  General Comments      Exercises     Assessment/Plan    PT Assessment Patient needs continued PT services  PT Problem List         PT Treatment Interventions DME instruction;Gait training;Functional mobility training;Therapeutic activities;Therapeutic exercise    PT Goals (Current goals can be found in the Care Plan section)  Acute Rehab PT Goals Patient Stated Goal: Regain IND PT Goal Formulation: With patient Time For Goal Achievement: 07/14/18 Potential to  Achieve Goals: Fair    Frequency Min 3X/week   Barriers to discharge        Co-evaluation               AM-PAC PT "6 Clicks" Daily Activity  Outcome Measure Difficulty turning over in bed (including adjusting bedclothes, sheets and blankets)?: A Lot Difficulty moving from lying on back to sitting on the side of the bed? : A Lot Difficulty sitting down on and standing up from a chair with arms (e.g., wheelchair, bedside commode, etc,.)?: A Lot Help needed moving to and from a bed to chair (including a wheelchair)?: A Little Help needed walking in hospital room?: A Little Help needed climbing 3-5 steps with a railing? : A Little 6 Click Score: 15    End of Session   Activity Tolerance: Patient limited by fatigue;Patient limited by pain Patient left: in chair;with call bell/phone within reach Nurse Communication: Mobility status PT Visit Diagnosis: Unsteadiness on feet (R26.81);Difficulty in walking, not elsewhere classified (R26.2);Muscle weakness (generalized) (M62.81);Pain Pain - Right/Left: Right    Time: 2725-3664 PT Time Calculation (min) (ACUTE ONLY): 13 min   Charges:   PT Evaluation $PT Eval Low Complexity: 1 Low          Mauro Kaufmann PT Acute Rehabilitation Services Pager [phone] Office [phone]   Lacretia Tindall 07/07/2018, 3:29 PM

## 2018-07-07 NOTE — Discharge Summary (Addendum)
Discharge Summary  Kelli Rivera MRN:[id number] DOB: 08/03/1951  PCP: Aguiar, Rafaela M, MD  Admit date: 07/05/2018 Discharge date: 07/07/2018  Time spent: 25 minutes  Recommendations for Outpatient Follow-up:  1. Please follow-up with your rheumatologist within a week 2. Please follow-up with your primary care provider 3. Please take your medications as prescribed  Discharge Diagnoses:  Active Hospital Problems   Diagnosis Date Noted  . Chronic dermatitis 07/06/2018  . Venous insufficiency of both lower extremities 07/06/2018  . Palmar erythema 07/06/2018  . RA (rheumatoid arthritis) (HCC) 07/06/2018  . DJD (degenerative joint disease) 07/06/2018  . OSA (obstructive sleep apnea) 07/06/2018  . Eosinophilia 07/06/2018  . GERD (gastroesophageal reflux disease) 09/24/2017  . Diabetes mellitus type 2 in obese (HCC) 09/23/2016  . Morbid obesity (HCC) 09/23/2016  . COPD (chronic obstructive pulmonary disease) (HCC) 09/23/2016  . Hyperlipidemia 09/23/2016  . Leukocytosis 09/23/2016    Resolved Hospital Problems  No resolved problems to display.    Discharge Condition: Stable  Diet recommendation: Resume previous diet  Vitals:   07/07/18 0827 07/07/18 1308  BP:  118/65  Pulse: (!) 110 (!) 108  Resp: 18 18  Temp:  98.4 F (36.9 C)  SpO2: 90% 92%    History of present illness:   Kelli Riverais a 67 y.o.femalewith medical history significant of HLD, DM type II, chronic leukocytosis, collagen vascular disease, and COPD; who presents with fatigue andnew red patches/skin eruptions affecting hands and legs symmetrically.  Lesions are very itchy and non-blanchable.  To note, patient was on methotrexate and prednisone at home. On presentation afebrile, leukocytosis with WBC of 22 K, heart rate 119, respiration rate 24.  Unclear etiology of the rash. Consulted ID to further assess.  07/07/2018: Patient seen and examined with her daughter at bedside.  No acute events overnight.   States rash is resolving after receiving IV Benadryl.  Per infectious disease the rash is noninfectious.  Believes that the swelling and redness of her ankle is due to acute on chronic venous insufficiency.  On the day of discharge, the patient was hemodynamically stable.  She will need to follow-up with her rheumatologist, primary care provider post hospitalization.   Hospital Course:  Active Problems:   Leukocytosis   Morbid obesity (HCC)   COPD (chronic obstructive pulmonary disease) (HCC)   Hyperlipidemia   Diabetes mellitus type 2 in obese (HCC)   GERD (gastroesophageal reflux disease)   Chronic dermatitis   Venous insufficiency of both lower extremities   Palmar erythema   RA (rheumatoid arthritis) (HCC)   DJD (degenerative joint disease)   OSA (obstructive sleep apnea)   Eosinophilia  Generalized weakness secondary to muscle relaxant Hold cyclobenzaprine Fall precautions PT to assess  Resolving symmetrical rash affecting lower extremities arms and hands ID consulted and ruled out infective process Stop IV antibiotics Received IV Benadryl Continue Benadryl 25 mg p.o. 3 times daily x5 days  Rheumatoid arthritis Follow-up with your rheumatologist On methotrexate and prednisone Take your medications as prescribed  Asymptomatic pyuria Stopped IV antibiotics on 07/06/18 U cx positive for >100,000 klebsiella pneumoniae Defer to ID to restart antibiotics No need to treat per ID since asymptomatic  Leukocytosis: Acute on chronic.  Patient presents with a white blood cell count of 22.1.  Previous history of chronically elevated white blood cell count for which she was evaluated by hematology and had negative bone marrow biopsy.    Diabetes mellitus type 2: Last hemoglobin A1c available in care everywhere noted to be   6.5 on 03/03/2018. - Hypoglycemic protocols - Held metformin - Continue home regimen of insulin glargine 34 units subcutaneously nightly - CBGs q. before  meals with sensitive SSI  COPD, without acute exacerbation: At baseline patient on 2 L nasal cannula oxygen. - Continue pharmacy substitution of Dulera and DuoNeb breathing treatments as needed  GERD - Continue omeprazole  Morbid obesity: BMI 44.99 - Weight loss outpatient     Procedures:  None  Consultations:  ID  Discharge Exam: BP 118/65   Pulse (!) 108   Temp 98.4 F (36.9 C) (Oral)   Resp 18   Ht 5' 3" (1.6 m)   Wt 115.2 kg   SpO2 92%   BMI 44.99 kg/m  . General: 67 y.o. year-old female pleasant, morbidly obese in NAD, A&O x 3. . Cardiovascular: Regular rate and rhythm with no rubs or gallops.  No thyromegaly or JVD noted.   . Respiratory: Clear to auscultation with no wheezes or rales. Good inspiratory effort. . Abdomen: Soft nontender nondistended with normal bowel sounds x4 quadrants. . Musculoskeletal: Trace lower extremity edema. 2/4 pulses in all 4 extremities. . Skin: improved erythema affecting LE bilaterally and mid thighs bilaterally. Resolving palmar desquamation. . Psychiatry: Mood is appropriate for condition and setting  Discharge Instructions You were cared for by a hospitalist during your hospital stay. If you have any questions about your discharge medications or the care you received while you were in the hospital after you are discharged, you can call the unit and asked to speak with the hospitalist on call if the hospitalist that took care of you is not available. Once you are discharged, your primary care physician will handle any further medical issues. Please note that NO REFILLS for any discharge medications will be authorized once you are discharged, as it is imperative that you return to your primary care physician (or establish a relationship with a primary care physician if you do not have one) for your aftercare needs so that they can reassess your need for medications and monitor your lab values.   Allergies as of 07/07/2018       Reactions   Liraglutide Nausea And Vomiting   Pioglitazone Swelling   Amoxicillin-pot Clavulanate Nausea And Vomiting      Medication List    STOP taking these medications   cyclobenzaprine 5 MG tablet Commonly known as:  FLEXERIL   guaiFENesin 600 MG 12 hr tablet Commonly known as:  MUCINEX   ibuprofen 800 MG tablet Commonly known as:  ADVIL,MOTRIN   traMADol 50 MG tablet Commonly known as:  ULTRAM     TAKE these medications   ADVAIR DISKUS 500-50 MCG/DOSE Aepb Generic drug:  Fluticasone-Salmeterol Inhale 1 puff into the lungs every 12 (twelve) hours as needed (shortness of breath).   albuterol 108 (90 Base) MCG/ACT inhaler Commonly known as:  PROVENTIL HFA;VENTOLIN HFA Inhale 2 puffs into the lungs every 4 (four) hours as needed for wheezing or shortness of breath.   aspirin EC 81 MG tablet Take 81 mg by mouth daily.   benzonatate 200 MG capsule Commonly known as:  TESSALON Take 1 capsule (200 mg total) by mouth 3 (three) times daily as needed for cough.   CENTRUM SILVER 50+WOMEN PO Take 1 tablet by mouth daily.   chlorpheniramine-HYDROcodone 10-8 MG/5ML Suer Commonly known as:  TUSSIONEX Take 5 mLs by mouth every 12 (twelve) hours as needed for cough.   diphenhydrAMINE 50 MG tablet Commonly known as:  BENADRYL Take 0.5 tablets (  25 mg total) by mouth 3 (three) times daily for 5 days.   folic acid 1 MG tablet Commonly known as:  FOLVITE Take 1 mg by mouth at bedtime.   furosemide 40 MG tablet Commonly known as:  LASIX Take 1 tablet (40 mg total) by mouth daily.   ipratropium-albuterol 0.5-2.5 (3) MG/3ML Soln Commonly known as:  DUONEB Take 3 mLs by nebulization 4 (four) times daily. What changed:    when to take this  reasons to take this   metFORMIN 1000 MG tablet Commonly known as:  GLUCOPHAGE Take 1,000 mg by mouth at bedtime.   methotrexate (PF) 50 MG/2ML injection Inject 0.6 mLs into the muscle once a week. On Saturday   mometasone 0.1 %  cream Commonly known as:  ELOCON Apply 1 application topically 2 (two) times daily as needed (irritation).   montelukast 10 MG tablet Commonly known as:  SINGULAIR Take 10 mg by mouth at bedtime.   omeprazole 40 MG capsule Commonly known as:  PRILOSEC Take 40 mg by mouth at bedtime.   oxybutynin 15 MG 24 hr tablet Commonly known as:  DITROPAN XL Take 15 mg by mouth daily.   predniSONE 5 MG tablet Commonly known as:  DELTASONE Take 1 tablet (5 mg total) by mouth daily with breakfast. What changed:    medication strength  how much to take  how to take this  when to take this  additional instructions   TOUJEO SOLOSTAR 300 UNIT/ML Sopn Generic drug:  Insulin Glargine Inject 30 Units into the skin at bedtime.   TRULICITY 1.5 MG/0.5ML Sopn Generic drug:  Dulaglutide Inject 1.5 mg into the skin every Thursday.      Allergies  Allergen Reactions  . Liraglutide Nausea And Vomiting  . Pioglitazone Swelling  . Amoxicillin-Pot Clavulanate Nausea And Vomiting   Follow-up Information    Aguiar, Rafaela M, MD. Call in 1 day(s).   Specialty:  Family Medicine Contact information: [address] [place] [address] [phone]            The results of significant diagnostics from this hospitalization (including imaging, microbiology, ancillary and laboratory) are listed below for reference.    Significant Diagnostic Studies: Dg Chest 2 View  Result Date: 07/06/2018 CLINICAL DATA:  Redness and swelling of legs for 2 days, weakness, pain in BILATERAL hands, generalized body aches, history of cellulitis, diabetes mellitus, COPD, asthma, former smoker EXAM: CHEST - 2 VIEW COMPARISON:  09/24/2017 FINDINGS: Normal heart size, mediastinal contours, and pulmonary vascularity. Atherosclerotic calcifications aorta. Lungs clear. No pleural effusion or pneumothorax. Bones unremarkable. IMPRESSION: No acute abnormalities. Electronically Signed   By: Mark  Boles  M.D.   On: 07/06/2018 00:27    Microbiology: Recent Results (from the past 240 hour(s))  Blood culture (routine x 2)     Status: None (Preliminary result)   Collection Time: 07/06/18 12:25 AM  Result Value Ref Range Status   Specimen Description   Final    BLOOD RIGHT ANTECUBITAL Performed at [facility] [place], [address] [place], [place] [address]    Special Requests   Final    BOTTLES DRAWN AEROBIC AND ANAEROBIC Blood Culture adequate volume Performed at [facility] [place], [address] [place], [place] [address]    Culture   Final    NO GROWTH 1 DAY Performed at [facility], [address] [place], [place] [address]    Report Status PENDING  Incomplete    Blood culture (routine x 2)     Status: None (Preliminary result)   Collection Time: 07/06/18 12:30 AM  Result Value Ref Range Status   Specimen Description   Final    BLOOD LEFT FOREARM Performed at [facility] [place], [address] [place], [place] [address]    Special Requests   Final    BOTTLES DRAWN AEROBIC AND ANAEROBIC Blood Culture adequate volume Performed at [facility] [place], [address] [place], [place] [address]    Culture   Final    NO GROWTH 1 DAY Performed at [facility], [address] [place], [place] [address]    Report Status PENDING  Incomplete  Culture, Urine     Status: Abnormal (Preliminary result)   Collection Time: 07/06/18  6:05 AM  Result Value Ref Range Status   Specimen Description   Final    URINE, CLEAN CATCH Performed at [facility] [place], [address] [place], [place] [address]    Special Requests   Final    NONE Performed at [facility] [place], [address] [place], [place] [address]    Culture >=100,000 COLONIES/mL KLEBSIELLA PNEUMONIAE (A)  Final   Report Status PENDING  Incomplete  MRSA PCR Screening     Status: Abnormal   Collection Time: 07/06/18  8:07 AM  Result Value Ref Range Status   MRSA by PCR  POSITIVE (A) NEGATIVE Final    Comment:        The GeneXpert MRSA Assay (FDA approved for NASAL specimens only), is one component of a comprehensive MRSA colonization surveillance program. It is not intended to diagnose MRSA infection nor to guide or monitor treatment for MRSA infections. RESULT CALLED TO, READ BACK BY AND VERIFIED WITH: R MARTIN,RN 07/06/18 1102 RHOLMES Performed at [facility], [address] , [place] [address]      Labs: Basic Metabolic Panel: Recent Labs  Lab 07/05/18 2359 07/06/18 2034 07/07/18 0347  NA 139 140 140  K 3.7 3.8 4.3  CL 103 104 103  CO2 28 29 28  GLUCOSE 224* 183* 172*  BUN 18 20 20  CREATININE 0.90 0.90 0.92  CALCIUM 8.2* 8.2* 8.3*   Liver Function Tests: Recent Labs  Lab 07/05/18 2359 07/07/18 0347  AST 11* 28  ALT 10 17  ALKPHOS 64 73  BILITOT 0.6 0.5  PROT 5.1* 5.1*  ALBUMIN 2.4* 2.4*   No results for input(s): LIPASE, AMYLASE in the last 168 hours. No results for input(s): AMMONIA in the last 168 hours. CBC: Recent Labs  Lab 07/05/18 2327 07/06/18 2034 07/07/18 0347  WBC 22.1* 22.2* 23.5*  NEUTROABS 16.6*  --   --   HGB 12.2 11.7* 12.0  HCT 38.9 39.1 39.8  MCV 77.6* 80.1 80.9  PLT PLATELET CLUMPS NOTED ON SMEAR, COUNT APPEARS ADEQUATE 217 236   Cardiac Enzymes: Recent Labs  Lab 07/05/18 2327 07/06/18 0831  CKTOTAL  --  12*  TROPONINI <0.03  --    BNP: BNP (last 3 results) Recent Labs    09/25/17 0322  BNP 38.6    ProBNP (last 3 results) No results for input(s): PROBNP in the last 8760 hours.  CBG: Recent Labs  Lab 07/06/18 1122 07/06/18 1655 07/06/18 2235 07/07/18 0719 07/07/18 1153  GLUCAP 168* 132* 194* 128* 143*       Signed:  Carole N Hall, MD [facility] 07/07/2018, 2:31 PM  

## 2018-07-07 NOTE — Plan of Care (Signed)
Pt feeling better this am. RN will monitor. Plan of discussed with pt.

## 2018-07-07 NOTE — Care Management (Signed)
Home health orders written by MD. Choice offered to pt and [facility] chosen for HHPT. [facility] rep alerted of referral. Sandford Craze RN,BSN [phone]

## 2018-07-08 LAB — URINE CULTURE

## 2018-07-11 LAB — CULTURE, BLOOD (ROUTINE X 2)
Culture: NO GROWTH
Culture: NO GROWTH
Special Requests: ADEQUATE
Special Requests: ADEQUATE

## 2018-11-06 ENCOUNTER — Encounter (INDEPENDENT_AMBULATORY_CARE_PROVIDER_SITE_OTHER): Payer: Self-pay | Admitting: Physician Assistant

## 2018-11-06 ENCOUNTER — Ambulatory Visit (INDEPENDENT_AMBULATORY_CARE_PROVIDER_SITE_OTHER): Payer: Medicare Other | Admitting: Physician Assistant

## 2018-11-06 ENCOUNTER — Ambulatory Visit (INDEPENDENT_AMBULATORY_CARE_PROVIDER_SITE_OTHER): Payer: Self-pay

## 2018-11-06 DIAGNOSIS — G8929 Other chronic pain: Secondary | ICD-10-CM

## 2018-11-06 DIAGNOSIS — M1711 Unilateral primary osteoarthritis, right knee: Secondary | ICD-10-CM | POA: Diagnosis not present

## 2018-11-06 DIAGNOSIS — M25561 Pain in right knee: Secondary | ICD-10-CM | POA: Diagnosis not present

## 2018-11-06 MED ORDER — LIDOCAINE HCL 1 % IJ SOLN
3.0000 mL | INTRAMUSCULAR | Status: AC | PRN
  Administered 2018-11-06: 3 mL

## 2018-11-06 MED ORDER — METHYLPREDNISOLONE ACETATE 40 MG/ML IJ SUSP
40.0000 mg | INTRAMUSCULAR | Status: AC | PRN
  Administered 2018-11-06: 40 mg via INTRA_ARTICULAR

## 2018-11-06 NOTE — Progress Notes (Signed)
Office Visit Note   Patient: Kelli Rivera           Date of Birth: 11/12/1950           MRN: [id number] Visit Date: 11/06/2018              Requested by: Angelica ChessmanAguiar, Rafaela M, MD [id number], [place] [id number] PCP: Angelica ChessmanAguiar, Rafaela M, MD   Assessment & Plan: Visit Diagnoses:  1. Chronic pain of right knee   2. Primary osteoarthritis of right knee     Plan: Applauded the patient and her weight loss of 40 pounds over the year.  However discussed with her that due to her BMI and body habitus of particularly her knees we need to refer her to bariatric counseling.  She will follow-up with Dr. Magnus IvanBlackman in 3 months check her progress lack of.  Discussed quad strengthening exercises with her and knee friendly exercises offered formal physical therapy she defers.  Questions were encouraged and answered at length.  Follow-Up Instructions: Return in about 3 months (around 02/05/2019) for Dr. Magnus IvanBlackman.   Orders:  Orders Placed This Encounter  Procedures  . Large Joint Inj: R knee  . XR KNEE 3 VIEW RIGHT   No orders of the defined types were placed in this encounter.     Procedures: Large Joint Inj: R knee on 11/06/2018 12:04 PM Indications: pain Details: 22 G 1.5 in needle, anterolateral approach  Arthrogram: No  Medications: 3 mL lidocaine 1 %; 40 mg methylPREDNISolone acetate 40 MG/ML Outcome: tolerated well, no immediate complications Procedure, treatment alternatives, risks and benefits explained, specific risks discussed. Consent was given by the patient. Immediately prior to procedure a time out was called to verify the correct patient, procedure, equipment, support staff and site/side marked as required. Patient was prepped and draped in the usual sterile fashion.       Clinical Data: No additional findings.   Subjective: Chief Complaint  Patient presents with  . Right Knee - Pain    HPI Kelli Rivera is a very pleasant 68 year old female comes in  today with right knee pain.  She states she has had right knee pain for years no known injury.  She states she has seen multiple orthopedic surgeons who have stated there is nothing surgically can be done due to her weight.  However she has lost a total of 40 pounds over the last year with diet.  She states exercise is difficult for her due to the pain in her knees.  She is gotten periodic cortisone injections which do raise her glucose levels and she is diabetic these were helping considerably but now they are less effective.  She reports her last hemoglobin A1c to be 6.4.  She does occasionally use a walker or cane to ambulate on bad days.  Weather definitely seems to affect her knees.  She is also taken over-the-counter anti-inflammatories and using ice on her knees.  She has had no known injury to either knee. Review of Systems   Objective: Vital Signs: BMI 45.2.  Physical Exam Constitutional:      Appearance: She is obese. She is not ill-appearing or diaphoretic.  Pulmonary:     Effort: Pulmonary effort is normal.  Neurological:     Mental Status: She is alert and oriented to person, place, and time.  Psychiatric:        Mood and Affect: Mood normal.     Ortho Exam Bilateral knees No abnormal  warmth , erythema or effusion. Good range of motion bilateral knees.  Specialty Comments:  No specialty comments available.  Imaging: Xr Knee 3 View Right  Result Date: 11/06/2018 3 views right knee: Tricompartmental arthritic changes with near bone-on-bone medial compartment.  Lateralization of the patella on sunrise view.  Significant osteophytes about the knee joint.  No acute fractures.  No dislocation.    PMFS History: Patient Active Problem List   Diagnosis Date Noted  . Chronic dermatitis 07/06/2018  . Venous insufficiency of both lower extremities 07/06/2018  . Palmar erythema 07/06/2018  . RA (rheumatoid arthritis) (HCC) 07/06/2018  . DJD (degenerative joint disease)  07/06/2018  . OSA (obstructive sleep apnea) 07/06/2018  . Eosinophilia 07/06/2018  . GERD (gastroesophageal reflux disease) 09/24/2017  . Iron deficiency 09/24/2017  . Leukocytosis 09/23/2016  . Morbid obesity (HCC) 09/23/2016  . COPD (chronic obstructive pulmonary disease) (HCC) 09/23/2016  . Hyperlipidemia 09/23/2016  . Diabetes mellitus type 2 in obese (HCC) 09/23/2016   Past Medical History:  Diagnosis Date  . Asthma   . Collagen vascular disease (HCC)   . COPD (chronic obstructive pulmonary disease) (HCC)    O2 increased to 3L while ambulating, 2L at rest as of last week  . Diabetes mellitus without complication (HCC)   . High cholesterol   . History of leukocytosis    negative bone marrow, followed by heme q75mo  . Morbid obesity (HCC)     Family History  Problem Relation Age of Onset  . Heart failure Mother 38  . Lung cancer Father 71    Past Surgical History:  Procedure Laterality Date  . ABDOMINAL HYSTERECTOMY    . APPENDECTOMY     age 64 yrs  . bladder tuck    . CHOLECYSTECTOMY     Social History   Occupational History  . Not on file  Tobacco Use  . Smoking status: Former Smoker    Packs/day: 1.00    Years: 42.00    Pack years: 42.00    Last attempt to quit: 2007    Years since quitting: 13.0  . Smokeless tobacco: Never Used  Substance and Sexual Activity  . Alcohol use: No  . Drug use: No  . Sexual activity: Not on file

## 2018-11-13 ENCOUNTER — Ambulatory Visit (INDEPENDENT_AMBULATORY_CARE_PROVIDER_SITE_OTHER): Payer: Self-pay | Admitting: Orthopaedic Surgery

## 2018-11-14 ENCOUNTER — Telehealth (INDEPENDENT_AMBULATORY_CARE_PROVIDER_SITE_OTHER): Payer: Self-pay | Admitting: Physician Assistant

## 2018-11-14 NOTE — Telephone Encounter (Signed)
11/06/18 ov note faxed to Dr Wende Crease [phone]

## 2019-01-01 ENCOUNTER — Emergency Department (HOSPITAL_BASED_OUTPATIENT_CLINIC_OR_DEPARTMENT_OTHER): Payer: Medicare Other

## 2019-01-01 ENCOUNTER — Encounter (HOSPITAL_BASED_OUTPATIENT_CLINIC_OR_DEPARTMENT_OTHER): Payer: Self-pay | Admitting: Emergency Medicine

## 2019-01-01 ENCOUNTER — Other Ambulatory Visit: Payer: Self-pay

## 2019-01-01 ENCOUNTER — Inpatient Hospital Stay (HOSPITAL_BASED_OUTPATIENT_CLINIC_OR_DEPARTMENT_OTHER)
Admission: EM | Admit: 2019-01-01 | Discharge: 2019-01-10 | DRG: 190 | Disposition: A | Discharge status: Home Health | Payer: Medicare Other | Attending: Internal Medicine | Admitting: Internal Medicine

## 2019-01-01 DIAGNOSIS — Z8249 Family history of ischemic heart disease and other diseases of the circulatory system: Secondary | ICD-10-CM | POA: Diagnosis not present

## 2019-01-01 DIAGNOSIS — G4733 Obstructive sleep apnea (adult) (pediatric): Secondary | ICD-10-CM | POA: Diagnosis not present

## 2019-01-01 DIAGNOSIS — N289 Disorder of kidney and ureter, unspecified: Secondary | ICD-10-CM | POA: Diagnosis present

## 2019-01-01 DIAGNOSIS — I5032 Chronic diastolic (congestive) heart failure: Secondary | ICD-10-CM | POA: Diagnosis present

## 2019-01-01 DIAGNOSIS — J441 Chronic obstructive pulmonary disease with (acute) exacerbation: Secondary | ICD-10-CM | POA: Diagnosis not present

## 2019-01-01 DIAGNOSIS — K219 Gastro-esophageal reflux disease without esophagitis: Secondary | ICD-10-CM | POA: Diagnosis present

## 2019-01-01 DIAGNOSIS — R0602 Shortness of breath: Secondary | ICD-10-CM | POA: Diagnosis present

## 2019-01-01 DIAGNOSIS — Z7951 Long term (current) use of inhaled steroids: Secondary | ICD-10-CM | POA: Diagnosis not present

## 2019-01-01 DIAGNOSIS — E785 Hyperlipidemia, unspecified: Secondary | ICD-10-CM | POA: Diagnosis present

## 2019-01-01 DIAGNOSIS — E669 Obesity, unspecified: Secondary | ICD-10-CM

## 2019-01-01 DIAGNOSIS — M069 Rheumatoid arthritis, unspecified: Secondary | ICD-10-CM | POA: Diagnosis not present

## 2019-01-01 DIAGNOSIS — J9811 Atelectasis: Secondary | ICD-10-CM | POA: Diagnosis present

## 2019-01-01 DIAGNOSIS — D638 Anemia in other chronic diseases classified elsewhere: Secondary | ICD-10-CM | POA: Diagnosis present

## 2019-01-01 DIAGNOSIS — I872 Venous insufficiency (chronic) (peripheral): Secondary | ICD-10-CM | POA: Diagnosis present

## 2019-01-01 DIAGNOSIS — E1165 Type 2 diabetes mellitus with hyperglycemia: Secondary | ICD-10-CM | POA: Diagnosis present

## 2019-01-01 DIAGNOSIS — J9601 Acute respiratory failure with hypoxia: Secondary | ICD-10-CM | POA: Diagnosis present

## 2019-01-01 DIAGNOSIS — Z9981 Dependence on supplemental oxygen: Secondary | ICD-10-CM | POA: Diagnosis not present

## 2019-01-01 DIAGNOSIS — Z888 Allergy status to other drugs, medicaments and biological substances status: Secondary | ICD-10-CM

## 2019-01-01 DIAGNOSIS — Z880 Allergy status to penicillin: Secondary | ICD-10-CM

## 2019-01-01 DIAGNOSIS — D72829 Elevated white blood cell count, unspecified: Secondary | ICD-10-CM | POA: Diagnosis present

## 2019-01-01 DIAGNOSIS — I700 Atherosclerosis of aorta: Secondary | ICD-10-CM | POA: Diagnosis present

## 2019-01-01 DIAGNOSIS — E1169 Type 2 diabetes mellitus with other specified complication: Secondary | ICD-10-CM | POA: Diagnosis not present

## 2019-01-01 DIAGNOSIS — D509 Iron deficiency anemia, unspecified: Secondary | ICD-10-CM | POA: Diagnosis present

## 2019-01-01 DIAGNOSIS — Z7982 Long term (current) use of aspirin: Secondary | ICD-10-CM

## 2019-01-01 DIAGNOSIS — T380X5A Adverse effect of glucocorticoids and synthetic analogues, initial encounter: Secondary | ICD-10-CM | POA: Diagnosis present

## 2019-01-01 DIAGNOSIS — Z6841 Body Mass Index (BMI) 40.0 and over, adult: Secondary | ICD-10-CM

## 2019-01-01 DIAGNOSIS — Z7952 Long term (current) use of systemic steroids: Secondary | ICD-10-CM

## 2019-01-01 DIAGNOSIS — D72828 Other elevated white blood cell count: Secondary | ICD-10-CM | POA: Diagnosis present

## 2019-01-01 DIAGNOSIS — L309 Dermatitis, unspecified: Secondary | ICD-10-CM | POA: Diagnosis present

## 2019-01-01 DIAGNOSIS — Z79899 Other long term (current) drug therapy: Secondary | ICD-10-CM

## 2019-01-01 DIAGNOSIS — J432 Centrilobular emphysema: Secondary | ICD-10-CM | POA: Diagnosis present

## 2019-01-01 DIAGNOSIS — Z794 Long term (current) use of insulin: Secondary | ICD-10-CM

## 2019-01-01 DIAGNOSIS — Z87891 Personal history of nicotine dependence: Secondary | ICD-10-CM

## 2019-01-01 LAB — GLUCOSE, CAPILLARY: Glucose-Capillary: 373 mg/dL — ABNORMAL HIGH (ref 70–99)

## 2019-01-01 LAB — BRAIN NATRIURETIC PEPTIDE: B Natriuretic Peptide: 22.5 pg/mL (ref 0.0–100.0)

## 2019-01-01 LAB — CBC WITH DIFFERENTIAL/PLATELET
Abs Immature Granulocytes: 1.67 10*3/uL — ABNORMAL HIGH (ref 0.00–0.07)
Basophils Absolute: 0.1 10*3/uL (ref 0.0–0.1)
Basophils Relative: 0 %
Eosinophils Absolute: 1.5 10*3/uL — ABNORMAL HIGH (ref 0.0–0.5)
Eosinophils Relative: 4 %
HCT: 46.5 % — ABNORMAL HIGH (ref 36.0–46.0)
Hemoglobin: 13.9 g/dL (ref 12.0–15.0)
Immature Granulocytes: 4 %
LYMPHS PCT: 6 %
Lymphs Abs: 2.6 10*3/uL (ref 0.7–4.0)
MCH: 23.2 pg — ABNORMAL LOW (ref 26.0–34.0)
MCHC: 29.9 g/dL — ABNORMAL LOW (ref 30.0–36.0)
MCV: 77.8 fL — ABNORMAL LOW (ref 80.0–100.0)
Monocytes Absolute: 1.6 10*3/uL — ABNORMAL HIGH (ref 0.1–1.0)
Monocytes Relative: 4 %
NEUTROS ABS: 33.8 10*3/uL — AB (ref 1.7–7.7)
Neutrophils Relative %: 82 %
Platelets: 236 10*3/uL (ref 150–400)
RBC: 5.98 MIL/uL — ABNORMAL HIGH (ref 3.87–5.11)
RDW: 18.2 % — ABNORMAL HIGH (ref 11.5–15.5)
SMEAR REVIEW: NORMAL
WBC: 41.3 10*3/uL — ABNORMAL HIGH (ref 4.0–10.5)
nRBC: 0 % (ref 0.0–0.2)

## 2019-01-01 LAB — COMPREHENSIVE METABOLIC PANEL
ALT: 19 U/L (ref 0–44)
AST: 14 U/L — ABNORMAL LOW (ref 15–41)
Albumin: 3 g/dL — ABNORMAL LOW (ref 3.5–5.0)
Alkaline Phosphatase: 116 U/L (ref 38–126)
Anion gap: 7 (ref 5–15)
BUN: 14 mg/dL (ref 8–23)
CO2: 27 mmol/L (ref 22–32)
Calcium: 8.6 mg/dL — ABNORMAL LOW (ref 8.9–10.3)
Chloride: 100 mmol/L (ref 98–111)
Creatinine, Ser: 1.11 mg/dL — ABNORMAL HIGH (ref 0.44–1.00)
GFR calc Af Amer: 59 mL/min — ABNORMAL LOW (ref >60)
GFR calc non Af Amer: 51 mL/min — ABNORMAL LOW (ref >60)
GLUCOSE: 228 mg/dL — AB (ref 70–99)
Potassium: 3.7 mmol/L (ref 3.5–5.1)
Sodium: 134 mmol/L — ABNORMAL LOW (ref 135–145)
Total Bilirubin: 0.7 mg/dL (ref 0.3–1.2)
Total Protein: 6.3 g/dL — ABNORMAL LOW (ref 6.5–8.1)

## 2019-01-01 LAB — INFLUENZA PANEL BY PCR (TYPE A & B)
Influenza A By PCR: NEGATIVE
Influenza B By PCR: NEGATIVE

## 2019-01-01 LAB — LACTIC ACID, PLASMA
Lactic Acid, Venous: 2.1 mmol/L (ref 0.5–1.9)
Lactic Acid, Venous: 1.5 mmol/L (ref 0.5–1.9)

## 2019-01-01 MED ORDER — ACETAMINOPHEN 650 MG RE SUPP: 650.0000 mg | Freq: Four times a day (QID) | RECTAL | Status: DC | PRN

## 2019-01-01 MED ORDER — IPRATROPIUM-ALBUTEROL 0.5-2.5 (3) MG/3ML IN SOLN
RESPIRATORY_TRACT | Status: AC
  Administered 2019-01-01: 3 mL via RESPIRATORY_TRACT
  Filled 2019-01-01: qty 3

## 2019-01-01 MED ORDER — IPRATROPIUM-ALBUTEROL 0.5-2.5 (3) MG/3ML IN SOLN
RESPIRATORY_TRACT | Status: AC
  Filled 2019-01-01: qty 3

## 2019-01-01 MED ORDER — HYDROCODONE-ACETAMINOPHEN 5-325 MG PO TABS
1.0000 | ORAL_TABLET | ORAL | Status: DC | PRN
  Administered 2019-01-03: 1 via ORAL
  Filled 2019-01-01 (×2): qty 1

## 2019-01-01 MED ORDER — ONDANSETRON HCL 4 MG/2ML IJ SOLN: 4.0000 mg | Freq: Four times a day (QID) | INTRAMUSCULAR | Status: DC | PRN

## 2019-01-01 MED ORDER — ENOXAPARIN SODIUM 40 MG/0.4ML ~~LOC~~ SOLN
40.0000 mg | SUBCUTANEOUS | Status: DC
  Administered 2019-01-01 – 2019-01-04 (×4): 40 mg via SUBCUTANEOUS
  Filled 2019-01-01 (×4): qty 0.4

## 2019-01-01 MED ORDER — INSULIN ASPART 100 UNIT/ML ~~LOC~~ SOLN
0.0000 [IU] | Freq: Three times a day (TID) | SUBCUTANEOUS | Status: DC
  Administered 2019-01-02: 11 [IU] via SUBCUTANEOUS
  Administered 2019-01-02 (×2): 15 [IU] via SUBCUTANEOUS
  Administered 2019-01-03: 5 [IU] via SUBCUTANEOUS
  Administered 2019-01-03 (×2): 15 [IU] via SUBCUTANEOUS
  Administered 2019-01-04: 11 [IU] via SUBCUTANEOUS
  Administered 2019-01-04: 8 [IU] via SUBCUTANEOUS
  Administered 2019-01-05: 3 [IU] via SUBCUTANEOUS
  Administered 2019-01-05: 2 [IU] via SUBCUTANEOUS
  Administered 2019-01-05: 13 [IU] via SUBCUTANEOUS
  Administered 2019-01-06: 15 [IU] via SUBCUTANEOUS
  Administered 2019-01-06: 3 [IU] via SUBCUTANEOUS
  Administered 2019-01-06: 2 [IU] via SUBCUTANEOUS
  Administered 2019-01-07 (×3): 3 [IU] via SUBCUTANEOUS

## 2019-01-01 MED ORDER — SODIUM CHLORIDE 0.9 % IV BOLUS
500.0000 mL | Freq: Once | INTRAVENOUS | Status: AC
  Administered 2019-01-01: 500 mL via INTRAVENOUS

## 2019-01-01 MED ORDER — SODIUM CHLORIDE 0.9% FLUSH: 3.0000 mL | INTRAVENOUS | Status: DC | PRN

## 2019-01-01 MED ORDER — SODIUM CHLORIDE 0.9% FLUSH
3.0000 mL | Freq: Two times a day (BID) | INTRAVENOUS | Status: DC
  Administered 2019-01-02 – 2019-01-10 (×9): 3 mL via INTRAVENOUS

## 2019-01-01 MED ORDER — MOMETASONE FURO-FORMOTEROL FUM 200-5 MCG/ACT IN AERO
2.0000 | INHALATION_SPRAY | Freq: Two times a day (BID) | RESPIRATORY_TRACT | Status: DC
  Administered 2019-01-02 (×3): 2 via RESPIRATORY_TRACT
  Filled 2019-01-01: qty 8.8

## 2019-01-01 MED ORDER — IPRATROPIUM-ALBUTEROL 0.5-2.5 (3) MG/3ML IN SOLN
3.0000 mL | Freq: Once | RESPIRATORY_TRACT | Status: AC
  Administered 2019-01-01: 3 mL via RESPIRATORY_TRACT

## 2019-01-01 MED ORDER — ALBUTEROL SULFATE (2.5 MG/3ML) 0.083% IN NEBU
2.5000 mg | INHALATION_SOLUTION | RESPIRATORY_TRACT | Status: DC | PRN
  Administered 2019-01-03: 2.5 mg via RESPIRATORY_TRACT
  Filled 2019-01-01: qty 3

## 2019-01-01 MED ORDER — METHYLPREDNISOLONE SODIUM SUCC 125 MG IJ SOLR
125.0000 mg | Freq: Once | INTRAMUSCULAR | Status: AC
  Administered 2019-01-01: 125 mg via INTRAVENOUS
  Filled 2019-01-01: qty 2

## 2019-01-01 MED ORDER — ALBUTEROL SULFATE (2.5 MG/3ML) 0.083% IN NEBU
INHALATION_SOLUTION | RESPIRATORY_TRACT | Status: AC
  Administered 2019-01-01: 2.5 mg via RESPIRATORY_TRACT
  Filled 2019-01-01: qty 3

## 2019-01-01 MED ORDER — ALBUTEROL SULFATE (2.5 MG/3ML) 0.083% IN NEBU
2.5000 mg | INHALATION_SOLUTION | Freq: Once | RESPIRATORY_TRACT | Status: AC
  Administered 2019-01-01: 2.5 mg via RESPIRATORY_TRACT

## 2019-01-01 MED ORDER — SODIUM CHLORIDE 0.9% FLUSH
3.0000 mL | Freq: Two times a day (BID) | INTRAVENOUS | Status: DC
  Administered 2019-01-03 – 2019-01-08 (×11): 3 mL via INTRAVENOUS

## 2019-01-01 MED ORDER — DOXYCYCLINE HYCLATE 100 MG PO TABS
200.0000 mg | ORAL_TABLET | Freq: Every day | ORAL | Status: AC
  Administered 2019-01-02 – 2019-01-08 (×7): 200 mg via ORAL
  Filled 2019-01-01 (×7): qty 2

## 2019-01-01 MED ORDER — METHYLPREDNISOLONE SODIUM SUCC 125 MG IJ SOLR
60.0000 mg | Freq: Four times a day (QID) | INTRAMUSCULAR | Status: DC
  Administered 2019-01-01 – 2019-01-02 (×2): 60 mg via INTRAVENOUS
  Filled 2019-01-01 (×2): qty 2

## 2019-01-01 MED ORDER — INSULIN GLARGINE 100 UNIT/ML ~~LOC~~ SOLN
15.0000 [IU] | Freq: Every day | SUBCUTANEOUS | Status: DC
  Administered 2019-01-01: 15 [IU] via SUBCUTANEOUS
  Filled 2019-01-01: qty 0.15

## 2019-01-01 MED ORDER — INSULIN ASPART 100 UNIT/ML ~~LOC~~ SOLN
0.0000 [IU] | Freq: Every day | SUBCUTANEOUS | Status: DC
  Administered 2019-01-01: 5 [IU] via SUBCUTANEOUS
  Administered 2019-01-02: 4 [IU] via SUBCUTANEOUS
  Administered 2019-01-04: 2 [IU] via SUBCUTANEOUS
  Administered 2019-01-05 – 2019-01-06 (×2): 3 [IU] via SUBCUTANEOUS
  Administered 2019-01-07: 2 [IU] via SUBCUTANEOUS

## 2019-01-01 MED ORDER — ALBUTEROL SULFATE (2.5 MG/3ML) 0.083% IN NEBU
INHALATION_SOLUTION | RESPIRATORY_TRACT | Status: AC
  Filled 2019-01-01: qty 3

## 2019-01-01 MED ORDER — ONDANSETRON HCL 4 MG PO TABS: 4.0000 mg | ORAL_TABLET | Freq: Four times a day (QID) | ORAL | Status: DC | PRN

## 2019-01-01 MED ORDER — SODIUM CHLORIDE 0.9 % IV SOLN
250.0000 mL | INTRAVENOUS | Status: DC | PRN
  Administered 2019-01-02: 10 mL via INTRAVENOUS

## 2019-01-01 MED ORDER — SODIUM CHLORIDE 0.9 % IV SOLN
Freq: Once | INTRAVENOUS | Status: AC
  Administered 2019-01-01: 21:00:00 via INTRAVENOUS

## 2019-01-01 MED ORDER — ACETAMINOPHEN 325 MG PO TABS
650.0000 mg | ORAL_TABLET | Freq: Four times a day (QID) | ORAL | Status: DC | PRN
  Administered 2019-01-02 (×2): 650 mg via ORAL
  Filled 2019-01-01 (×3): qty 2

## 2019-01-01 MED ORDER — POLYETHYLENE GLYCOL 3350 17 G PO PACK
17.0000 g | PACK | Freq: Every day | ORAL | Status: DC | PRN
  Administered 2019-01-03: 17 g via ORAL
  Filled 2019-01-01 (×2): qty 1

## 2019-01-01 MED ORDER — IPRATROPIUM-ALBUTEROL 0.5-2.5 (3) MG/3ML IN SOLN
3.0000 mL | Freq: Four times a day (QID) | RESPIRATORY_TRACT | Status: DC
  Administered 2019-01-01 – 2019-01-02 (×3): 3 mL via RESPIRATORY_TRACT
  Filled 2019-01-01 (×3): qty 3

## 2019-01-01 NOTE — ED Provider Notes (Signed)
[facility] EMERGENCY DEPARTMENT Provider Note   CSN: [id number] Arrival date & time: 01/01/19  1601    History   Chief Complaint Chief Complaint  Patient presents with  . Shortness of Breath    HPI Kelli Rivera is a 68 y.o. female.     HPI Patient has 2L oxygen dependent COPD.  She has had almost a months worth of problems with worsening cough that is been persistent and refractory to treatment.  She reports that she has been on a steroid taper that was fairly prolonged.  She was doing somewhat better on that.  She then was seen by her pulmonologist last week who wanted her to taper off.  She took a Medrol Dosepak.  She reports she has had 2 courses of antibiotics.  She cannot remember the names.  She reports that she has not had any improvement.  She reports she is very short of breath with any activity.  She is using her nebulizer machine at home very frequently.  She continues to have a harsh cough that is giving her diffuse chest achiness.  She reports sometimes she is getting some nausea and vomiting with coughing.  She denies lower extremity swelling or calf pain.  No documented fever.  Patient reports he has had elevated blood cell count in the past.  She reports he has been seen by hematology.  She reports that was about a year ago.  She describes having had a biopsy and being told that there was no identifiable problem.  Patient quit smoking sometime greater than 10 years ago. Past Medical History:  Diagnosis Date  . Asthma   . Collagen vascular disease (HCC)   . COPD (chronic obstructive pulmonary disease) (HCC)    O2 increased to 3L while ambulating, 2L at rest as of last week  . Diabetes mellitus without complication (HCC)   . High cholesterol   . History of leukocytosis    negative bone marrow, followed by heme q320mo  . Morbid obesity [facility](HCC)     Patient Active Problem List   Diagnosis Date Noted  . Chronic dermatitis 07/06/2018  . Venous insufficiency of  both lower extremities 07/06/2018  . Palmar erythema 07/06/2018  . RA (rheumatoid arthritis) (HCC) 07/06/2018  . DJD (degenerative joint disease) 07/06/2018  . OSA (obstructive sleep apnea) 07/06/2018  . Eosinophilia 07/06/2018  . GERD (gastroesophageal reflux disease) 09/24/2017  . Iron deficiency 09/24/2017  . Leukocytosis 09/23/2016  . Morbid obesity (HCC) 09/23/2016  . COPD (chronic obstructive pulmonary disease) (HCC) 09/23/2016  . Hyperlipidemia 09/23/2016  . Diabetes mellitus type 2 in obese (HCC) 09/23/2016    Past Surgical History:  Procedure Laterality Date  . ABDOMINAL HYSTERECTOMY    . APPENDECTOMY     age 68 yrs  . bladder tuck    . CHOLECYSTECTOMY       OB History   No obstetric history on file.      Home Medications    Prior to Admission medications   Medication Sig Start Date End Date Taking? Authorizing Provider  albuterol (PROVENTIL HFA;VENTOLIN HFA) 108 (90 Base) MCG/ACT inhaler Inhale 2 puffs into the lungs every 4 (four) hours as needed for wheezing or shortness of breath. 12/17/15   [provider]  aspirin EC 81 MG tablet Take 81 mg by mouth daily.    [provider]  benzonatate (TESSALON) 200 MG capsule Take 1 capsule (200 mg total) by mouth 3 (three) times daily as needed for cough. Patient  not taking: Reported on 07/06/2018 10/01/17   Maretta Bees, MD  chlorpheniramine-HYDROcodone (TUSSIONEX) 10-8 MG/5ML SUER Take 5 mLs by mouth every 12 (twelve) hours as needed for cough. Patient not taking: Reported on 07/06/2018 10/01/17   Maretta Bees, MD  diphenhydrAMINE (BENADRYL) 50 MG tablet Take 0.5 tablets (25 mg total) by mouth 3 (three) times daily for 5 days. 07/07/18 07/12/18  Darlin Drop, DO  Fluticasone-Salmeterol (ADVAIR DISKUS) 500-50 MCG/DOSE AEPB Inhale 1 puff into the lungs every 12 (twelve) hours as needed (shortness of breath).     [provider]  folic acid (FOLVITE) 1 MG tablet Take 1 mg by mouth at  bedtime.    [provider]  furosemide (LASIX) 40 MG tablet Take 1 tablet (40 mg total) by mouth daily. 10/01/17   Ghimire, Werner Lean, MD  ipratropium-albuterol (DUONEB) 0.5-2.5 (3) MG/3ML SOLN Take 3 mLs by nebulization 4 (four) times daily. Patient taking differently: Take 3 mLs by nebulization 4 (four) times daily as needed (shortness of breath).  10/01/17   Ghimire, Werner Lean, MD  metFORMIN (GLUCOPHAGE) 1000 MG tablet Take 1,000 mg by mouth at bedtime.     [provider]  Methotrexate Sodium (METHOTREXATE, PF,) 50 MG/2ML injection Inject 0.6 mLs into the muscle once a week. On Saturday 06/19/18   [provider]  mometasone (ELOCON) 0.1 % cream Apply 1 application topically 2 (two) times daily as needed (irritation).  04/12/18   [provider]  montelukast (SINGULAIR) 10 MG tablet Take 10 mg by mouth at bedtime. 08/18/15   [provider]  Multiple Vitamins-Minerals (CENTRUM SILVER 50+WOMEN PO) Take 1 tablet by mouth daily.    [provider]  omeprazole (PRILOSEC) 40 MG capsule Take 40 mg by mouth at bedtime.  08/31/16   [provider]  oxybutynin (DITROPAN XL) 15 MG 24 hr tablet Take 15 mg by mouth daily. 06/01/18   [provider]  predniSONE (DELTASONE) 5 MG tablet Take 1 tablet (5 mg total) by mouth daily with breakfast. 07/07/18   Darlin Drop, DO  TOUJEO SOLOSTAR 300 UNIT/ML SOPN Inject 30 Units into the skin at bedtime.  08/31/16   [provider]  TRULICITY 1.5 MG/0.5ML SOPN Inject 1.5 mg into the skin every Thursday. 09/19/17   [provider]    Family History Family History  Problem Relation Age of Onset  . Heart failure Mother 9  . Lung cancer Father 58    Social History Social History   Tobacco Use  . Smoking status: Former Smoker    Packs/day: 1.00    Years: 42.00    Pack years: 42.00    Last attempt to quit: 2007    Years since quitting: 13.1  . Smokeless tobacco: Never Used    Substance Use Topics  . Alcohol use: No  . Drug use: No     Allergies   Liraglutide; Pioglitazone; and Amoxicillin-pot clavulanate   Review of Systems Review of Systems 10 Systems reviewed and are negative for acute change except as noted in the HPI.   Physical Exam Updated Vital Signs BP 113/75 (BP Location: Left Arm)   Pulse (!) 133   Temp 99.7 F (37.6 C) (Oral)   Resp (!) 22   Ht 5\' 3"  (1.6 m)   Wt 113.4 kg   SpO2 95%   BMI 44.29 kg/m   Physical Exam Constitutional:      Comments: Patient is alert and nontoxic.  Mild increased work of  breathing at rest.  Intermittent harsh paroxysmal cough.  HENT:     Mouth/Throat:     Mouth: Mucous membranes are moist.     Pharynx: Oropharynx is clear.  Eyes:     Extraocular Movements: Extraocular movements intact.  Cardiovascular:     Comments: Borderline tachycardia.  No gross rub murmur gallop. Pulmonary:     Comments: Mild increased work of breathing.  Patient has diffuse expiratory wheeze throughout the lung fields.  Decreased airflow to the bases. Abdominal:     General: There is no distension.     Palpations: Abdomen is soft.     Tenderness: There is no abdominal tenderness. There is no guarding.  Musculoskeletal: Normal range of motion.        General: No swelling or tenderness.  Skin:    General: Skin is warm and dry.  Neurological:     General: No focal deficit present.     Mental Status: She is oriented to person, place, and time.     Coordination: Coordination normal.  Psychiatric:        Mood and Affect: Mood normal.      ED Treatments / Results  Labs (all labs ordered are listed, but only abnormal results are displayed) Labs Reviewed  COMPREHENSIVE METABOLIC PANEL - Abnormal; Notable for the following components:      Result Value   Sodium 134 (*)    Glucose, Bld 228 (*)    Creatinine, Ser 1.11 (*)    Calcium 8.6 (*)    Total Protein 6.3 (*)    Albumin 3.0 (*)    AST 14 (*)    GFR calc non  Af Amer 51 (*)    GFR calc Af Amer 59 (*)    All other components within normal limits  LACTIC ACID, PLASMA - Abnormal; Notable for the following components:   Lactic Acid, Venous 2.1 (*)    All other components within normal limits  CBC WITH DIFFERENTIAL/PLATELET - Abnormal; Notable for the following components:   WBC 41.3 (*)    RBC 5.98 (*)    HCT 46.5 (*)    MCV 77.8 (*)    MCH 23.2 (*)    MCHC 29.9 (*)    RDW 18.2 (*)    Neutro Abs 33.8 (*)    Monocytes Absolute 1.6 (*)    Eosinophils Absolute 1.5 (*)    Abs Immature Granulocytes 1.67 (*)    All other components within normal limits  BRAIN NATRIURETIC PEPTIDE  LACTIC ACID, PLASMA  INFLUENZA PANEL BY PCR (TYPE A & B)    EKG EKG Interpretation  Date/Time:  Monday January 01 2019 17:00:47 EDT Ventricular Rate:  119 PR Interval:    QRS Duration: 87 QT Interval:  318 QTC Calculation: 448 R Axis:   -40 Text Interpretation:  Sinus tachycardia Inferior infarct, old no acute ischemia, no change from previous Confirmed by Arby BarrettePfeiffer, Epimenio Schetter [phone]) on 01/01/2019 5:53:14 PM   Radiology Dg Chest 2 View  Result Date: 01/01/2019 CLINICAL DATA:  68 year old female with 2 weeks of shortness of breath and congestion. EXAM: CHEST - 2 VIEW COMPARISON:  Chest radiographs 12/20/2018 and earlier. FINDINGS: Stable lung volumes. Mild chronic linear scarring in the lingula. Normal cardiac size and mediastinal contours. Visualized tracheal air column is within normal limits. No pneumothorax, pulmonary edema, pleural effusion or acute pulmonary opacity. No acute osseous abnormality identified. Stable cholecystectomy clips. IMPRESSION: No acute cardiopulmonary abnormality. Electronically Signed   By: Althea GrimmerH  Hall M.D.  On: 01/01/2019 17:48    Procedures Procedures (including critical care time)  Medications Ordered in ED Medications  albuterol (PROVENTIL) (2.5 MG/3ML) 0.083% nebulizer solution (  Not Given 01/01/19 1638)  ipratropium-albuterol (DUONEB)  0.5-2.5 (3) MG/3ML nebulizer solution (  Not Given 01/01/19 1638)  ipratropium-albuterol (DUONEB) 0.5-2.5 (3) MG/3ML nebulizer solution 3 mL (has no administration in time range)  0.9 %  sodium chloride infusion (has no administration in time range)  methylPREDNISolone sodium succinate (SOLU-MEDROL) 125 mg/2 mL injection 125 mg (125 mg Intravenous Given 01/01/19 1714)  ipratropium-albuterol (DUONEB) 0.5-2.5 (3) MG/3ML nebulizer solution 3 mL (3 mLs Nebulization Given 01/01/19 1619)  albuterol (PROVENTIL) (2.5 MG/3ML) 0.083% nebulizer solution 2.5 mg (2.5 mg Nebulization Given 01/01/19 1619)  sodium chloride 0.9 % bolus 500 mL (500 mLs Intravenous New Bag/Given 01/01/19 1949)     Initial Impression / Assessment and Plan / ED Course  I have reviewed the triage vital signs and the nursing notes.  Pertinent labs & imaging results that were available during my care of the patient were reviewed by me and considered in my medical decision making (see chart for details).       Consult: Reviewed with Dr. Toniann Fail for admission  Patient presents with refractory COPD.  She has been treated on 2 rounds of outpatient oral steroids as well as antibiotics.  She continues to have worsening shortness of breath and coughing.  She has had increasing use of her home nebulizer therapy.  Patient does have significant leukocytosis.  She has had this in the past and is seen by hematology.  Today's values however are significantly higher.  Unclear if that is due to recent prolonged steroid use or if patient may need repeat hematological assessment.  Mental status is clear.  At this time she is stable on 2 L of oxygen.  We will continue nebulizer treatments as scheduled and steroid therapy.  Patient will be admitted.  Final Clinical Impressions(s) / ED Diagnoses   Final diagnoses:  COPD exacerbation (HCC)  Leukocytosis, unspecified type    ED Discharge Orders    None       Arby Barrette, MD 01/01/19 [phone]

## 2019-01-01 NOTE — Progress Notes (Signed)
Patient arrived to unit 2 west bed 02 from [facility].Assisted to bed by nursing staff.Oriented patient to nursing unit and call bell system.Educated patient to call for assistance before getting out of bed to go to bathroom and verbalized understanding.No acute distress noted at present time.Will continue to monitor patient.

## 2019-01-01 NOTE — ED Notes (Signed)
C/o sob, cough and congestion x 2 weeks  Ha  Onset yesterday Has been seen for same but no better

## 2019-01-01 NOTE — ED Triage Notes (Signed)
SOB x2 weeks.  Seen at [facility] and and given steriods and abx. Pt is no better.  Wheezing and [place].

## 2019-01-01 NOTE — H&P (Signed)
History and Physical    Kelli Rivera NAT:[id number] DOB: 09-29-1951 DOA: 01/01/2019  PCP: Angelica Chessman, MD   Patient coming from: Home   Chief Complaint: SOB, cough  HPI: Kelli Rivera is a 68 y.o. female with medical history significant for COPD, nocturnal supplemental oxygen requirement, insulin-dependent diabetes mellitus, chronic leukocytosis, rheumatoid arthritis, now presenting to the emergency department for evaluation of increased shortness of breath, increased productive cough, and wheezing.  The patient reports that symptoms developed proximately 2 weeks ago, she was started on prednisone taper and an unknown antibiotic, had no significant improvement, and was started on a second course of steroids and antibiotic.  She reports that she may have improved some for a day or so with these outpatient treatments, but has begun to worsen again.  She has not noted any fevers or chills, denies any chest pain, reports occasional leg swelling but none now.  [facility] ED Course: Upon arrival to the ED, patient is found to be afebrile, saturating mid 90s on 2 L/min of supplemental oxygen, mildly tachypneic, tachycardic in the 120s, and with stable blood pressure.  EKG features sinus tachycardia with rate 119 and chest x-ray is negative for acute cardiopulmonary disease.  Chemistry panel is notable for creatinine 1.11, up from 0.9 in September.  CBC features and increase in her chronic leukocytosis, now 41,300.  Initial lactic acid is slightly elevated at 2.1 and BNP is normal.  Patient was given 125 mg of IV Solu-Medrol, 500 cc normal saline, albuterol, and 2 DuoNeb's in the ED.  She reports some improvement, but remains dyspneic at rest and will be admitted for ongoing evaluation and management.  Review of Systems:  All other systems reviewed and apart from HPI, are negative.  Past Medical History:  Diagnosis Date  . Asthma   . Collagen vascular disease (HCC)   . COPD  (chronic obstructive pulmonary disease) (HCC)    O2 increased to 3L while ambulating, 2L at rest as of last week  . Diabetes mellitus without complication (HCC)   . High cholesterol   . History of leukocytosis    negative bone marrow, followed by heme q60mo  . Morbid obesity (HCC)     Past Surgical History:  Procedure Laterality Date  . ABDOMINAL HYSTERECTOMY    . APPENDECTOMY     age 41 yrs  . bladder tuck    . CHOLECYSTECTOMY       reports that she quit smoking about 13 years ago. She has a 42.00 pack-year smoking history. She has never used smokeless tobacco. She reports that she does not drink alcohol or use drugs.  Allergies  Allergen Reactions  . Liraglutide Nausea And Vomiting  . Pioglitazone Swelling  . Amoxicillin-Pot Clavulanate Nausea And Vomiting    Family History  Problem Relation Age of Onset  . Heart failure Mother 9  . Lung cancer Father 21     Prior to Admission medications   Medication Sig Start Date End Date Taking? Authorizing Provider  albuterol (PROVENTIL HFA;VENTOLIN HFA) 108 (90 Base) MCG/ACT inhaler Inhale 2 puffs into the lungs every 4 (four) hours as needed for wheezing or shortness of breath. 12/17/15   [provider]  aspirin EC 81 MG tablet Take 81 mg by mouth daily.    [provider]  benzonatate (TESSALON) 200 MG capsule Take 1 capsule (200 mg total) by mouth 3 (three) times daily as needed for cough. Patient not taking: Reported on 07/06/2018 10/01/17  Ghimire, Werner LeanShanker M, MD  chlorpheniramine-HYDROcodone (TUSSIONEX) 10-8 MG/5ML SUER Take 5 mLs by mouth every 12 (twelve) hours as needed for cough. Patient not taking: Reported on 07/06/2018 10/01/17   Maretta BeesGhimire, Shanker M, MD  diphenhydrAMINE (BENADRYL) 50 MG tablet Take 0.5 tablets (25 mg total) by mouth 3 (three) times daily for 5 days. 07/07/18 07/12/18  Darlin DropHall, Carole N, DO  Fluticasone-Salmeterol (ADVAIR DISKUS) 500-50 MCG/DOSE AEPB Inhale 1 puff into the lungs every 12 (twelve)  hours as needed (shortness of breath).     [provider]  folic acid (FOLVITE) 1 MG tablet Take 1 mg by mouth at bedtime.    [provider]  furosemide (LASIX) 40 MG tablet Take 1 tablet (40 mg total) by mouth daily. 10/01/17   Ghimire, Werner LeanShanker M, MD  ipratropium-albuterol (DUONEB) 0.5-2.5 (3) MG/3ML SOLN Take 3 mLs by nebulization 4 (four) times daily. Patient taking differently: Take 3 mLs by nebulization 4 (four) times daily as needed (shortness of breath).  10/01/17   Ghimire, Werner LeanShanker M, MD  metFORMIN (GLUCOPHAGE) 1000 MG tablet Take 1,000 mg by mouth at bedtime.     [provider]  Methotrexate Sodium (METHOTREXATE, PF,) 50 MG/2ML injection Inject 0.6 mLs into the muscle once a week. On Saturday 06/19/18   [provider]  mometasone (ELOCON) 0.1 % cream Apply 1 application topically 2 (two) times daily as needed (irritation).  04/12/18   [provider]  montelukast (SINGULAIR) 10 MG tablet Take 10 mg by mouth at bedtime. 08/18/15   [provider]  Multiple Vitamins-Minerals (CENTRUM SILVER 50+WOMEN PO) Take 1 tablet by mouth daily.    [provider]  omeprazole (PRILOSEC) 40 MG capsule Take 40 mg by mouth at bedtime.  08/31/16   [provider]  oxybutynin (DITROPAN XL) 15 MG 24 hr tablet Take 15 mg by mouth daily. 06/01/18   [provider]  predniSONE (DELTASONE) 5 MG tablet Take 1 tablet (5 mg total) by mouth daily with breakfast. 07/07/18   Darlin DropHall, Carole N, DO  TOUJEO SOLOSTAR 300 UNIT/ML SOPN Inject 30 Units into the skin at bedtime.  08/31/16   [provider]  TRULICITY 1.5 MG/0.5ML SOPN Inject 1.5 mg into the skin every Thursday. 09/19/17   [provider]    Physical Exam: Vitals:   01/01/19 2030 01/01/19 2100 01/01/19 2128 01/01/19 2238  BP:  (!) 107/45  109/61  Pulse:  (!) 118  (!) 112  Resp:  (!) 23  20  Temp: 98.9 F (37.2 C)  100 F (37.8 C) 98.4 F (36.9 C)  TempSrc: Oral   Rectal Oral  SpO2:  94%  96%  Weight:    112.8 kg  Height:        Constitutional: NAD, calm  Eyes: PERTLA, lids and conjunctivae normal ENMT: Mucous membranes are moist. Posterior pharynx clear of any exudate or lesions.   Neck: normal, supple, no masses, no thyromegaly Respiratory: Breath sounds diminished bilaterally. Scattered rhonchi, occasional wheeze. Mild dyspnea with speech. No pallor, no accessory muscle use.  Cardiovascular: S1 & S2 heard, regular rate and rhythm. No extremity edema.   Abdomen: No distension, no tenderness, soft. Bowel sounds normal.  Musculoskeletal: no clubbing / cyanosis. No joint deformity upper and lower extremities.   Skin: no significant rashes, lesions, ulcers. Warm, dry, well-perfused. Neurologic: no facial asymmetry. Sensation intact. Moving all extremities.  Psychiatric: Alert and oriented x 3. Calm, cooperative.    Labs on Admission: I have personally reviewed following labs  and imaging studies  CBC: Recent Labs  Lab 01/01/19 1717  WBC 41.3*  NEUTROABS 33.8*  HGB 13.9  HCT 46.5*  MCV 77.8*  PLT 236   Basic Metabolic Panel: Recent Labs  Lab 01/01/19 1717  NA 134*  K 3.7  CL 100  CO2 27  GLUCOSE 228*  BUN 14  CREATININE 1.11*  CALCIUM 8.6*   GFR: Estimated Creatinine Clearance: 57.8 mL/min (A) (by C-G formula based on SCr of 1.11 mg/dL (H)). Liver Function Tests: Recent Labs  Lab 01/01/19 1717  AST 14*  ALT 19  ALKPHOS 116  BILITOT 0.7  PROT 6.3*  ALBUMIN 3.0*   No results for input(s): LIPASE, AMYLASE in the last 168 hours. No results for input(s): AMMONIA in the last 168 hours. Coagulation Profile: No results for input(s): INR, PROTIME in the last 168 hours. Cardiac Enzymes: No results for input(s): CKTOTAL, CKMB, CKMBINDEX, TROPONINI in the last 168 hours. BNP (last 3 results) No results for input(s): PROBNP in the last 8760 hours. HbA1C: No results for input(s): HGBA1C in the last 72 hours. CBG: Recent Labs    Lab 01/01/19 2241  GLUCAP 373*   Lipid Profile: No results for input(s): CHOL, HDL, LDLCALC, TRIG, CHOLHDL, LDLDIRECT in the last 72 hours. Thyroid Function Tests: No results for input(s): TSH, T4TOTAL, FREET4, T3FREE, THYROIDAB in the last 72 hours. Anemia Panel: No results for input(s): VITAMINB12, FOLATE, FERRITIN, TIBC, IRON, RETICCTPCT in the last 72 hours. Urine analysis:    Component Value Date/Time   COLORURINE YELLOW 07/05/2018 2327   APPEARANCEUR HAZY (A) 07/05/2018 2327   LABSPEC 1.025 07/05/2018 2327   PHURINE 6.0 07/05/2018 2327   GLUCOSEU NEGATIVE 07/05/2018 2327   HGBUR NEGATIVE 07/05/2018 2327   BILIRUBINUR SMALL (A) 07/05/2018 2327   KETONESUR 15 (A) 07/05/2018 2327   PROTEINUR NEGATIVE 07/05/2018 2327   NITRITE POSITIVE (A) 07/05/2018 2327   LEUKOCYTESUR TRACE (A) 07/05/2018 2327   Sepsis Labs: (procalcitonin:4,lacticidven:4) )No results found for this or any previous visit (from the past 240 hour(s)).   Radiological Exams on Admission: Dg Chest 2 View  Result Date: 01/01/2019 CLINICAL DATA:  68 year old female with 2 weeks of shortness of breath and congestion. EXAM: CHEST - 2 VIEW COMPARISON:  Chest radiographs 12/20/2018 and earlier. FINDINGS: Stable lung volumes. Mild chronic linear scarring in the lingula. Normal cardiac size and mediastinal contours. Visualized tracheal air column is within normal limits. No pneumothorax, pulmonary edema, pleural effusion or acute pulmonary opacity. No acute osseous abnormality identified. Stable cholecystectomy clips. IMPRESSION: No acute cardiopulmonary abnormality. Electronically Signed   By: Odessa Fleming M.D.   On: 01/01/2019 17:48    EKG: Independently reviewed. Sinus tachycardia, rate 119.   Assessment/Plan   1. COPD with acute exacerbation  - Presents with increased SOB, productive cough, and wheezing  - She recently had 2 courses of steroids and antibiotics, may have improved some initially, but now  worsening again  - She reports some improvement after 125 mg IV Solu-Medrol and multiple neb treatments in ED, but continues to be dyspneic at rest  - Continue ICS/LABA, continue duonebs, check sputum culture, continue systemic steroids, start doxycycline    2. Leukocytosis  - WBC is 41,300 on admission, up from 23,500 in September  - She has seen hematology previously for chronic leukocytosis  - She has been on systemic steroids recently for COPD exacerbation, currently on antibiotic for severe COPD exacerbation, culture blood if febrile    3. Chronic diastolic CHF  - Appears  compensated  - Given 500 cc NS bolus in ED  - SLIV, hold Lasix, follow daily wt    4. Mild renal insufficiency  - SCr is 1.11 on admission, up from 0.92 in September  - She was given 500 cc NS in ED  - Renally-dose medications, hold Lasix, repeat chem panel in am   5. Insulin-dependent DM  - No recent A1c on file  - Med-rec pending, previously on metformin, Trulicity, and Toujeo 30 units qHS  - She is on systemic steroids as above  - Check CBG's and use Lantus plus Novolog correctional for now   6. Rheumatoid arthritis  - Med rec pending, currently on Solu-Medrol as above    DVT prophylaxis: Lovenox Code Status: Full  Family Communication: Daughters updated at bedside Consults called: None Admission status: Inpatient     Briscoe Deutscherimothy S , MD [facility] Pager [phone]  If 7PM-7AM, please contact night-coverage [email or web address] Password [email or web address]  01/01/2019, 10:52 PM

## 2019-01-01 NOTE — ED Notes (Signed)
[facility] notified (Taryn) - patient ready for transport 

## 2019-01-02 DIAGNOSIS — D72828 Other elevated white blood cell count: Secondary | ICD-10-CM

## 2019-01-02 LAB — BASIC METABOLIC PANEL
Anion gap: 11 (ref 5–15)
BUN: 16 mg/dL (ref 8–23)
CO2: 24 mmol/L (ref 22–32)
Calcium: 8.7 mg/dL — ABNORMAL LOW (ref 8.9–10.3)
Chloride: 101 mmol/L (ref 98–111)
Creatinine, Ser: 1.06 mg/dL — ABNORMAL HIGH (ref 0.44–1.00)
GFR calc Af Amer: >60 mL/min (ref >60)
GFR calc non Af Amer: 54 mL/min — ABNORMAL LOW (ref >60)
Glucose, Bld: 447 mg/dL — ABNORMAL HIGH (ref 70–99)
Potassium: 4.8 mmol/L (ref 3.5–5.1)
Sodium: 136 mmol/L (ref 135–145)

## 2019-01-02 LAB — CBC WITH DIFFERENTIAL/PLATELET
Abs Immature Granulocytes: 1.68 10*3/uL — ABNORMAL HIGH (ref 0.00–0.07)
Basophils Absolute: 0.1 10*3/uL (ref 0.0–0.1)
Basophils Relative: 0 %
Eosinophils Absolute: 0.9 10*3/uL — ABNORMAL HIGH (ref 0.0–0.5)
Eosinophils Relative: 2 %
HCT: 42.1 % (ref 36.0–46.0)
Hemoglobin: 12.7 g/dL (ref 12.0–15.0)
Immature Granulocytes: 4 %
Lymphocytes Relative: 4 %
Lymphs Abs: 1.6 10*3/uL (ref 0.7–4.0)
MCH: 23.3 pg — ABNORMAL LOW (ref 26.0–34.0)
MCHC: 30.2 g/dL (ref 30.0–36.0)
MCV: 77.1 fL — ABNORMAL LOW (ref 80.0–100.0)
Monocytes Absolute: 0.4 10*3/uL (ref 0.1–1.0)
Monocytes Relative: 1 %
Neutro Abs: 37.5 10*3/uL — ABNORMAL HIGH (ref 1.7–7.7)
Neutrophils Relative %: 89 %
PLATELETS: 202 10*3/uL (ref 150–400)
RBC: 5.46 MIL/uL — ABNORMAL HIGH (ref 3.87–5.11)
RDW: 17.5 % — ABNORMAL HIGH (ref 11.5–15.5)
WBC: 42.2 10*3/uL — AB (ref 4.0–10.5)
nRBC: 0 % (ref 0.0–0.2)

## 2019-01-02 LAB — GLUCOSE, CAPILLARY
GLUCOSE-CAPILLARY: 363 mg/dL — AB (ref 70–99)
Glucose-Capillary: 384 mg/dL — ABNORMAL HIGH (ref 70–99)
Glucose-Capillary: 342 mg/dL — ABNORMAL HIGH (ref 70–99)
Glucose-Capillary: 314 mg/dL — ABNORMAL HIGH (ref 70–99)

## 2019-01-02 LAB — HIV ANTIBODY (ROUTINE TESTING W REFLEX): HIV Screen 4th Generation wRfx: NONREACTIVE

## 2019-01-02 LAB — MRSA PCR SCREENING: MRSA by PCR: POSITIVE — AB

## 2019-01-02 MED ORDER — INSULIN GLARGINE 100 UNIT/ML ~~LOC~~ SOLN
15.0000 [IU] | Freq: Once | SUBCUTANEOUS | Status: AC
  Administered 2019-01-02: 15 [IU] via SUBCUTANEOUS
  Filled 2019-01-02: qty 0.15

## 2019-01-02 MED ORDER — METHYLPREDNISOLONE SODIUM SUCC 40 MG IJ SOLR
40.0000 mg | Freq: Three times a day (TID) | INTRAMUSCULAR | Status: DC
  Administered 2019-01-02 – 2019-01-07 (×16): 40 mg via INTRAVENOUS
  Filled 2019-01-02 (×16): qty 1

## 2019-01-02 MED ORDER — INSULIN GLARGINE 100 UNIT/ML ~~LOC~~ SOLN
20.0000 [IU] | Freq: Two times a day (BID) | SUBCUTANEOUS | Status: DC
  Administered 2019-01-02: 20 [IU] via SUBCUTANEOUS
  Filled 2019-01-02: qty 0.2

## 2019-01-02 MED ORDER — IPRATROPIUM-ALBUTEROL 0.5-2.5 (3) MG/3ML IN SOLN
3.0000 mL | Freq: Three times a day (TID) | RESPIRATORY_TRACT | Status: DC
  Administered 2019-01-02 – 2019-01-06 (×12): 3 mL via RESPIRATORY_TRACT
  Filled 2019-01-02 (×12): qty 3

## 2019-01-02 MED ORDER — GUAIFENESIN-DM 100-10 MG/5ML PO SYRP
5.0000 mL | ORAL_SOLUTION | ORAL | Status: DC | PRN
  Administered 2019-01-02 – 2019-01-06 (×5): 5 mL via ORAL
  Filled 2019-01-02 (×5): qty 5

## 2019-01-02 MED ORDER — INSULIN GLARGINE 100 UNIT/ML ~~LOC~~ SOLN
35.0000 [IU] | Freq: Two times a day (BID) | SUBCUTANEOUS | Status: DC
  Administered 2019-01-02 – 2019-01-05 (×6): 35 [IU] via SUBCUTANEOUS
  Filled 2019-01-02 (×6): qty 0.35

## 2019-01-02 NOTE — Progress Notes (Addendum)
Inpatient Diabetes Program Recommendations  AACE/ADA: New Consensus Statement on Inpatient Glycemic Control (2015)  Target Ranges:  Prepandial:   less than 140 mg/dL      Peak postprandial:   less than 180 mg/dL (1-2 hours)      Critically ill patients:  140 - 180 mg/dL   Results for JAMIRIA, HANNOLD (MRN [id number]) as of 01/02/2019 08:53  Ref. Range 01/01/2019 22:41 01/02/2019 07:51  Glucose-Capillary Latest Ref Range: 70 - 99 mg/dL 017 (H)  5 units NOVOLOG +  15 units LANTUS 363 (H)  15 units NOVOLOG     Admit with: SOB/ COPD  History: DM, COPD  Home DM Meds: Toujeo 30 units QHS       Trulicity 1.5 mg Qweek       Metformin 1000 mg QHS  Current Orders: Lantus 15 units QHS      Novolog Moderate Correction Scale/ SSI (0-15 units) TID AC + HS      Patient received 125 mg Solumedrol X 1 dose yest at 5pm and now Getting Solumedrol 60 mg Q6 hours.  Lantus started last PM.     MD- Please consider the following in-hospital insulin adjustments while patient getting steroids and home oral DM meds are on hold:  1. Increase Lantus to 30 units QHS (please consider giving extra 15 units Lantus X 1 dose this AM since pt only received 15 units last PM)   2. Start Novolog Meal Coverage: Novolog 4 units TID with meals  (Please add the following Hold Parameters: Hold if pt eats <50% of meal, Hold if pt NPO)   3. Check Current Hemoglobin A1c level      --Will follow patient during hospitalization--  Ambrose Finland RN, MSN, CDE Diabetes Coordinator Inpatient Glycemic Control Team Team Pager: [phone] (8a-5p)

## 2019-01-02 NOTE — Progress Notes (Signed)
PROGRESS NOTE    [name]  LJQ:[id number] DOB: [birth year] DOA: 01/01/2019 PCP: Angelica Chessman, MD    Brief Narrative:  Kelli Rivera is a 68 y.o. female with medical history significant for COPD, nocturnal supplemental oxygen requirement, insulin-dependent diabetes mellitus, chronic leukocytosis, rheumatoid arthritis,presented to the emergency department for worsening of shortness of breath, increased productive cough, and wheezing.  The patient reports that symptoms developed proximately 2 weeks ago, she was started on prednisone taper and an unknown antibiotic, had no significant improvement, and was started on a second course of steroids and antibiotic.  She reports that she may have improved some for a day or so with these outpatient treatments, but has begun to worsen again.    Work-up in the emergency department patient was found to have oxygen saturations of 90% on 2 L of oxygen, tachypneic, tachycardic with heart rate in 120s.  KG showed sinus tachycardia.  Chest x-ray was negative for any acute cardiopulmonary disease.  Patient had initial lactic acid of 2.1.  Patient continued to have significant cough Assessment & Plan:   Principal Problem:   COPD with acute exacerbation (HCC) Active Problems:   Leukocytosis   Diabetes mellitus type 2 in obese (HCC)   RA (rheumatoid arthritis) (HCC)   OSA (obstructive sleep apnea)   Chronic diastolic CHF (congestive heart failure) (HCC)   Mild renal insufficiency   ## COPD with acute exacerbation  - Presents with increased SOB, productive cough, and wheezing  - Failed outpatient treatment with 2 courses of steroids and antibiotics -Keep the patient on Rocephin, Zithromax -Continue with the duo nebs, Solu-Medrol  ##Leukocytosis  - WBC is 41,300 on admission, up from 23,500 in September  - She has seen hematology previously for chronic leukocytosis  -  on systemic steroids recently for COPD exacerbation, currently on antibiotic for  severe COPD exacerbation, culture blood if febrile    -closely follow-up  ## Chronic diastolic CHF  - Appears compensated  - Given 500 cc NS bolus in ED  - SLIV, hold Lasix, follow daily wt    ##Mild renal insufficiency  - SCr is 1.11 on admission, up from 0.92 in September  - She was given 500 cc NS in ED  - Renally-dose medications, hold Lasix, repeat chem panel in am   ## Insulin-dependent DM  with hyperglycemia -Hold oral medications -Get hemoglobin A1c -Secondary to steroids -Keep the patient on Lantus 15 units daily -Follow-up on sliding scale insulin    ##Rheumatoid arthritis  -Hold oral medications for now  Morbid obesity -As BMI of 44 -Counseling done regarding diet and exercise  DVT prophylaxis: Lovenox  code Status: Full  family Communication: Daughter  disposition Plan: Home in 2 to 3 days    Antimicrobials:  Rocephin 01/02/2019 Doxycycline 01/01/2019  Subjective: Patient continues to have significant cough, unable to speak full sentences secondary to shortness of breath and cough  Objective: Vitals:   01/02/19 0312 01/02/19 0532 01/02/19 0740 01/02/19 0752  BP:  132/65  108/60  Pulse: (!) 101   (!) 101  Resp: 16 20    Temp:  97.7 F (36.5 C)  97.8 F (36.6 C)  TempSrc:  Oral  Oral  SpO2: 93% 95% 96% 97%  Weight:      Height:        Intake/Output Summary (Last 24 hours) at 01/02/2019 1302 Last data filed at 01/02/2019 1000 Gross per 24 hour  Intake 823.87 ml  Output 950 ml  Net -126.13  ml   Filed Weights   01/01/19 1607 01/01/19 2238  Weight: 113.4 kg 112.8 kg    Examination:  General exam: Appears calm and comfortable  Respiratory system: Bilateral diffuse wheezing, prolonged expiratory phase cardiovascular system: S1 & S2 heard, RRR. No JVD, murmurs, rubs, gallops or clicks. No pedal edema. Gastrointestinal system: Abdomen is nondistended, soft and nontender. No organomegaly or masses felt. Normal bowel sounds heard. Central  nervous system: Alert and oriented. No focal neurological deficits. Extremities: Symmetric 5 x 5 power. Skin: No rashes, lesions or ulcers Psychiatry: Judgement and insight appear normal. Mood & affect appropriate.     Data Reviewed: I have personally reviewed following labs and imaging studies  CBC: Recent Labs  Lab 01/01/19 1717 01/02/19 0250  WBC 41.3* 42.2*  NEUTROABS 33.8* 37.5*  HGB 13.9 12.7  HCT 46.5* 42.1  MCV 77.8* 77.1*  PLT 236 202   Basic Metabolic Panel: Recent Labs  Lab 01/01/19 1717 01/02/19 0250  NA 134* 136  K 3.7 4.8  CL 100 101  CO2 27 24  GLUCOSE 228* 447*  BUN 14 16  CREATININE 1.11* 1.06*  CALCIUM 8.6* 8.7*   GFR: Estimated Creatinine Clearance: 60.6 mL/min (A) (by C-G formula based on SCr of 1.06 mg/dL (H)). Liver Function Tests: Recent Labs  Lab 01/01/19 1717  AST 14*  ALT 19  ALKPHOS 116  BILITOT 0.7  PROT 6.3*  ALBUMIN 3.0*   No results for input(s): LIPASE, AMYLASE in the last 168 hours. No results for input(s): AMMONIA in the last 168 hours. Coagulation Profile: No results for input(s): INR, PROTIME in the last 168 hours. Cardiac Enzymes: No results for input(s): CKTOTAL, CKMB, CKMBINDEX, TROPONINI in the last 168 hours. BNP (last 3 results) No results for input(s): PROBNP in the last 8760 hours. HbA1C: No results for input(s): HGBA1C in the last 72 hours. CBG: Recent Labs  Lab 01/01/19 2241 01/02/19 0751 01/02/19 1126  GLUCAP 373* 363* 384*   Lipid Profile: No results for input(s): CHOL, HDL, LDLCALC, TRIG, CHOLHDL, LDLDIRECT in the last 72 hours. Thyroid Function Tests: No results for input(s): TSH, T4TOTAL, FREET4, T3FREE, THYROIDAB in the last 72 hours. Anemia Panel: No results for input(s): VITAMINB12, FOLATE, FERRITIN, TIBC, IRON, RETICCTPCT in the last 72 hours. Sepsis Labs: Recent Labs  Lab 01/01/19 1717 01/01/19 2032  LATICACIDVEN 2.1* 1.5    Recent Results (from the past 240 hour(s))  MRSA PCR  Screening     Status: Abnormal   Collection Time: 01/02/19 10:27 AM  Result Value Ref Range Status   MRSA by PCR POSITIVE (A) NEGATIVE Final    Comment:        The GeneXpert MRSA Assay (FDA approved for NASAL specimens only), is one component of a comprehensive MRSA colonization surveillance program. It is not intended to diagnose MRSA infection nor to guide or monitor treatment for MRSA infections. RESULT CALLED TO, READ BACK BY AND VERIFIED WITH: Linward Foster. Duncan RN 12:30 01/02/19 ([email or web address]) Performed at [facility], [address] [place], [place] [address]          Radiology Studies: Dg Chest 2 View  Result Date: 01/01/2019 CLINICAL DATA:  68 year old female with 2 weeks of shortness of breath and congestion. EXAM: CHEST - 2 VIEW COMPARISON:  Chest radiographs 12/20/2018 and earlier. FINDINGS: Stable lung volumes. Mild chronic linear scarring in the lingula. Normal cardiac size and mediastinal contours. Visualized tracheal air column is within normal limits. No pneumothorax, pulmonary edema, pleural effusion or acute  pulmonary opacity. No acute osseous abnormality identified. Stable cholecystectomy clips. IMPRESSION: No acute cardiopulmonary abnormality. Electronically Signed   By: Odessa Fleming M.D.   On: 01/01/2019 17:48        Scheduled Meds: . doxycycline  200 mg Oral Daily  . enoxaparin (LOVENOX) injection  40 mg Subcutaneous Q24H  . insulin aspart  0-15 Units Subcutaneous TID WC  . insulin aspart  0-5 Units Subcutaneous QHS  . insulin glargine  20 Units Subcutaneous BID  . ipratropium-albuterol  3 mL Nebulization TID  . methylPREDNISolone (SOLU-MEDROL) injection  40 mg Intravenous Q8H  . mometasone-formoterol  2 puff Inhalation BID  . sodium chloride flush  3 mL Intravenous Q12H  . sodium chloride flush  3 mL Intravenous Q12H   Continuous Infusions: . sodium chloride 10 mL (01/02/19 0135)     LOS: 1 day    Time spent: 40 minutes   Tiant Peixoto, MD [facility] Pager [phone]  If 7PM-7AM, please contact night-coverage [email or web address] Password [email or web address] 01/02/2019, 1:02 PM

## 2019-01-03 DIAGNOSIS — G4733 Obstructive sleep apnea (adult) (pediatric): Secondary | ICD-10-CM

## 2019-01-03 LAB — GLUCOSE, CAPILLARY
GLUCOSE-CAPILLARY: 387 mg/dL — AB (ref 70–99)
Glucose-Capillary: 417 mg/dL — ABNORMAL HIGH (ref 70–99)
Glucose-Capillary: 237 mg/dL — ABNORMAL HIGH (ref 70–99)
Glucose-Capillary: 167 mg/dL — ABNORMAL HIGH (ref 70–99)

## 2019-01-03 LAB — BASIC METABOLIC PANEL
Anion gap: 7 (ref 5–15)
BUN: 20 mg/dL (ref 8–23)
CALCIUM: 8.7 mg/dL — AB (ref 8.9–10.3)
CO2: 26 mmol/L (ref 22–32)
Chloride: 102 mmol/L (ref 98–111)
Creatinine, Ser: 0.82 mg/dL (ref 0.44–1.00)
GFR calc Af Amer: >60 mL/min (ref >60)
GFR calc non Af Amer: >60 mL/min (ref >60)
Glucose, Bld: 407 mg/dL — ABNORMAL HIGH (ref 70–99)
Potassium: 4.7 mmol/L (ref 3.5–5.1)
Sodium: 135 mmol/L (ref 135–145)

## 2019-01-03 LAB — CBC
HCT: 38.6 % (ref 36.0–46.0)
HEMOGLOBIN: 11.3 g/dL — AB (ref 12.0–15.0)
MCH: 22.6 pg — ABNORMAL LOW (ref 26.0–34.0)
MCHC: 29.3 g/dL — ABNORMAL LOW (ref 30.0–36.0)
MCV: 77 fL — ABNORMAL LOW (ref 80.0–100.0)
Platelets: 184 10*3/uL (ref 150–400)
RBC: 5.01 MIL/uL (ref 3.87–5.11)
RDW: 17.3 % — ABNORMAL HIGH (ref 11.5–15.5)
WBC: 31.5 10*3/uL — ABNORMAL HIGH (ref 4.0–10.5)
nRBC: 0 % (ref 0.0–0.2)

## 2019-01-03 MED ORDER — INSULIN ASPART 100 UNIT/ML ~~LOC~~ SOLN
10.0000 [IU] | Freq: Three times a day (TID) | SUBCUTANEOUS | Status: DC
  Administered 2019-01-03 – 2019-01-09 (×18): 10 [IU] via SUBCUTANEOUS

## 2019-01-03 MED ORDER — BUDESONIDE 0.5 MG/2ML IN SUSP
0.5000 mg | Freq: Two times a day (BID) | RESPIRATORY_TRACT | Status: DC
  Administered 2019-01-03 – 2019-01-10 (×15): 0.5 mg via RESPIRATORY_TRACT
  Filled 2019-01-03 (×15): qty 2

## 2019-01-03 MED ORDER — SODIUM CHLORIDE 0.9 % IV SOLN
1.0000 g | INTRAVENOUS | Status: DC
  Administered 2019-01-03 – 2019-01-04 (×2): 1 g via INTRAVENOUS
  Filled 2019-01-03 (×2): qty 10

## 2019-01-03 MED ORDER — FUROSEMIDE 10 MG/ML IJ SOLN
40.0000 mg | Freq: Every day | INTRAMUSCULAR | Status: DC
  Administered 2019-01-03 – 2019-01-08 (×6): 40 mg via INTRAVENOUS
  Filled 2019-01-03 (×7): qty 4

## 2019-01-03 NOTE — Progress Notes (Signed)
Inpatient Diabetes Program Recommendations  AACE/ADA: New Consensus Statement on Inpatient Glycemic Control (2015)  Target Ranges:  Prepandial:   less than 140 mg/dL      Peak postprandial:   less than 180 mg/dL (1-2 hours)      Critically ill patients:  140 - 180 mg/dL   Lab Results  Component Value Date   GLUCAP 417 (H) 01/03/2019   HGBA1C 7.7 (H) 09/23/2016    Review of Glycemic Control  Admit with: SOB/ COPD  History: DM, COPD  Home DM Meds: Toujeo 30 units QHS                             Trulicity 1.5 mg Qweek                             Metformin 1000 mg QHS  Current Orders: Lantus 30 units bid                            Novolog Moderate Correction Scale/ SSI (0-15 units) TID AC + HS  Inpatient Diabetes Program Recommendations:   Continued elevated CBGs. -While on steroids,Add Novolog 8 units tid meal coverage if eats 50% -A1c to determine prehospital glycemic control  Thank you, Billy Fischer. Tien Spooner, RN, MSN, CDE  Diabetes Coordinator Inpatient Glycemic Control Team Team Pager [phone] (8am-5pm) 01/03/2019 11:52 AM

## 2019-01-03 NOTE — Progress Notes (Signed)
PROGRESS NOTE    Kelli Rivera  YOF:[id number] DOB: Aug 21, 1951 DOA: 01/01/2019 PCP: Angelica Chessman, MD    Brief Narrative:  Kelli Rivera is a 68 y.o. female with medical history significant for COPD, nocturnal supplemental oxygen requirement, insulin-dependent diabetes mellitus, chronic leukocytosis, rheumatoid arthritis,presented to the emergency department for worsening of shortness of breath, increased productive cough, and wheezing.  The patient reports that symptoms developed proximately 2 weeks ago, she was started on prednisone taper and an unknown antibiotic, had no significant improvement, and was started on a second course of steroids and antibiotic.  She reports that she may have improved some for a day or so with these outpatient treatments, but has begun to worsen again.    Work-up in the emergency department patient was found to have oxygen saturations of 90% on 2 L of oxygen, tachypneic, tachycardic with heart rate in 120s.  KG showed sinus tachycardia.  Chest x-ray was negative for any acute cardiopulmonary disease.  Patient had initial lactic acid of 2.1.  Patient continued to have significant cough Assessment & Plan:   Principal Problem:   COPD with acute exacerbation (HCC) Active Problems:   Leukocytosis   Diabetes mellitus type 2 in obese (HCC)   RA (rheumatoid arthritis) (HCC)   OSA (obstructive sleep apnea)   Chronic diastolic CHF (congestive heart failure) (HCC)   Mild renal insufficiency   ## COPD with acute exacerbation  - Presents with increased SOB, productive cough, and wheezing  - Failed outpatient treatment with 2 courses of steroids and antibiotics -Keep the patient on Rocephin, Zithromax -Continue with the duo nebs, Solu-Medrol -Slow but gradual improvement -Patient still has significant bilateral wheezing with prolonged expiratory phase  ##Leukocytosis  - WBC is 41,300 on admission, up from 23,500 in September  - She has seen hematology previously  for chronic leukocytosis  -  on systemic steroids recently for COPD exacerbation, currently on antibiotic for severe COPD exacerbation, culture blood if febrile    -Trending down  ## Chronic diastolic CHF  -Keep the patient on IV Lasix daily and follow-up  ##Mild renal insufficiency  - SCr is 1.11 on admission, up from 0.92 in September  - She was given 500 cc NS in ED  - Renally-dose medications -Resolved  ## Insulin-dependent DM  with hyperglycemia -Hold oral medications -Get hemoglobin A1c-pending -Secondary to steroids -Keep the patient on Lantus 35 units twice daily -Patient still has high blood sugars in 400s -Add NovoLog 10 units with each meal -Follow-up on sliding scale insulin    ##Rheumatoid arthritis  -Hold oral medications for now  Morbid obesity -As BMI of 44 -Counseling done regarding diet and exercise -Encouraged patient to sit in the chair, walk tolerated  DVT prophylaxis: Lovenox  code Status: Full  family Communication: Daughter  disposition Plan: Home in 2 to 3 days    Antimicrobials:  Rocephin 01/02/2019 Doxycycline 01/01/2019  Subjective: Patient continues to have significant cough, unable to speak full sentences secondary to shortness of breath and cough  Objective: Vitals:   01/03/19 0111 01/03/19 0500 01/03/19 0742 01/03/19 0909  BP: 123/69  123/70   Pulse:   70   Resp: 18     Temp: (!) 97.5 F (36.4 C)  97.9 F (36.6 C)   TempSrc: Oral     SpO2: 98%  98% 97%  Weight:  113.1 kg    Height:        Intake/Output Summary (Last 24 hours) at 01/03/2019 1147 Last data  filed at 01/03/2019 0831 Gross per 24 hour  Intake 4.08 ml  Output 1250 ml  Net -1245.92 ml   Filed Weights   01/01/19 1607 01/01/19 2238 01/03/19 0500  Weight: 113.4 kg 112.8 kg 113.1 kg    Examination:  General exam: Appears calm and comfortable  Respiratory system: Bilateral diffuse wheezing, prolonged expiratory phase cardiovascular system: S1 & S2 heard, RRR.  No JVD, murmurs, rubs, gallops or clicks. No pedal edema. Gastrointestinal system: Abdomen is nondistended, soft and nontender. No organomegaly or masses felt. Normal bowel sounds heard. Central nervous system: Alert and oriented. No focal neurological deficits. Extremities: Symmetric 5 x 5 power. Skin: No rashes, lesions or ulcers Psychiatry: Judgement and insight appear normal. Mood & affect appropriate.     Data Reviewed: I have personally reviewed following labs and imaging studies  CBC: Recent Labs  Lab 01/01/19 1717 01/02/19 0250 01/03/19 0226  WBC 41.3* 42.2* 31.5*  NEUTROABS 33.8* 37.5*  --   HGB 13.9 12.7 11.3*  HCT 46.5* 42.1 38.6  MCV 77.8* 77.1* 77.0*  PLT 236 202 184   Basic Metabolic Panel: Recent Labs  Lab 01/01/19 1717 01/02/19 0250 01/03/19 0226  NA 134* 136 135  K 3.7 4.8 4.7  CL 100 101 102  CO2 27 24 26   GLUCOSE 228* 447* 407*  BUN 14 16 20   CREATININE 1.11* 1.06* 0.82  CALCIUM 8.6* 8.7* 8.7*   GFR: Estimated Creatinine Clearance: 78.4 mL/min (by C-G formula based on SCr of 0.82 mg/dL). Liver Function Tests: Recent Labs  Lab 01/01/19 1717  AST 14*  ALT 19  ALKPHOS 116  BILITOT 0.7  PROT 6.3*  ALBUMIN 3.0*   No results for input(s): LIPASE, AMYLASE in the last 168 hours. No results for input(s): AMMONIA in the last 168 hours. Coagulation Profile: No results for input(s): INR, PROTIME in the last 168 hours. Cardiac Enzymes: No results for input(s): CKTOTAL, CKMB, CKMBINDEX, TROPONINI in the last 168 hours. BNP (last 3 results) No results for input(s): PROBNP in the last 8760 hours. HbA1C: No results for input(s): HGBA1C in the last 72 hours. CBG: Recent Labs  Lab 01/02/19 1126 01/02/19 1622 01/02/19 2054 01/03/19 0748 01/03/19 1124  GLUCAP 384* 342* 314* 387* 417*   Lipid Profile: No results for input(s): CHOL, HDL, LDLCALC, TRIG, CHOLHDL, LDLDIRECT in the last 72 hours. Thyroid Function Tests: No results for input(s): TSH,  T4TOTAL, FREET4, T3FREE, THYROIDAB in the last 72 hours. Anemia Panel: No results for input(s): VITAMINB12, FOLATE, FERRITIN, TIBC, IRON, RETICCTPCT in the last 72 hours. Sepsis Labs: Recent Labs  Lab 01/01/19 1717 01/01/19 2032  LATICACIDVEN 2.1* 1.5    Recent Results (from the past 240 hour(s))  MRSA PCR Screening     Status: Abnormal   Collection Time: 01/02/19 10:27 AM  Result Value Ref Range Status   MRSA by PCR POSITIVE (A) NEGATIVE Final    Comment:        The GeneXpert MRSA Assay (FDA approved for NASAL specimens only), is one component of a comprehensive MRSA colonization surveillance program. It is not intended to diagnose MRSA infection nor to guide or monitor treatment for MRSA infections. RESULT CALLED TO, READ BACK BY AND VERIFIED WITH: Linward Foster. Duncan RN 12:30 01/02/19 ([email or web address]) Performed at [facility], [address] [place], [place] [address]          Radiology Studies: Dg Chest 2 View  Result Date: 01/01/2019 CLINICAL DATA:  68 year old female with 2 weeks of shortness of breath  and congestion. EXAM: CHEST - 2 VIEW COMPARISON:  Chest radiographs 12/20/2018 and earlier. FINDINGS: Stable lung volumes. Mild chronic linear scarring in the lingula. Normal cardiac size and mediastinal contours. Visualized tracheal air column is within normal limits. No pneumothorax, pulmonary edema, pleural effusion or acute pulmonary opacity. No acute osseous abnormality identified. Stable cholecystectomy clips. IMPRESSION: No acute cardiopulmonary abnormality. Electronically Signed   By: Odessa Fleming M.D.   On: 01/01/2019 17:48        Scheduled Meds: . budesonide (PULMICORT) nebulizer solution  0.5 mg Nebulization BID  . doxycycline  200 mg Oral Daily  . enoxaparin (LOVENOX) injection  40 mg Subcutaneous Q24H  . furosemide  40 mg Intravenous Daily  . insulin aspart  0-15 Units Subcutaneous TID WC  . insulin aspart  0-5 Units Subcutaneous QHS  . insulin glargine  35 Units  Subcutaneous BID  . ipratropium-albuterol  3 mL Nebulization TID  . methylPREDNISolone (SOLU-MEDROL) injection  40 mg Intravenous Q8H  . sodium chloride flush  3 mL Intravenous Q12H  . sodium chloride flush  3 mL Intravenous Q12H   Continuous Infusions: . sodium chloride 10 mL (01/02/19 0135)  . cefTRIAXone (ROCEPHIN)  IV 1 g (01/03/19 1129)     LOS: 2 days    Time spent: 40 minutes   Taiden Raybourn, MD [facility] Pager [phone]  If 7PM-7AM, please contact night-coverage [email or web address] Password [facility] 01/03/2019, 11:47 AM

## 2019-01-04 ENCOUNTER — Inpatient Hospital Stay (HOSPITAL_COMMUNITY): Payer: Medicare Other

## 2019-01-04 LAB — GLUCOSE, CAPILLARY
Glucose-Capillary: 272 mg/dL — ABNORMAL HIGH (ref 70–99)
Glucose-Capillary: 336 mg/dL — ABNORMAL HIGH (ref 70–99)
Glucose-Capillary: 108 mg/dL — ABNORMAL HIGH (ref 70–99)
Glucose-Capillary: 231 mg/dL — ABNORMAL HIGH (ref 70–99)

## 2019-01-04 LAB — CBC
HCT: 38.6 % (ref 36.0–46.0)
Hemoglobin: 11.3 g/dL — ABNORMAL LOW (ref 12.0–15.0)
MCH: 22.6 pg — AB (ref 26.0–34.0)
MCHC: 29.3 g/dL — ABNORMAL LOW (ref 30.0–36.0)
MCV: 77 fL — ABNORMAL LOW (ref 80.0–100.0)
Platelets: 181 10*3/uL (ref 150–400)
RBC: 5.01 MIL/uL (ref 3.87–5.11)
RDW: 17.1 % — ABNORMAL HIGH (ref 11.5–15.5)
WBC: 27.6 10*3/uL — ABNORMAL HIGH (ref 4.0–10.5)
nRBC: 0 % (ref 0.0–0.2)

## 2019-01-04 LAB — BASIC METABOLIC PANEL
Anion gap: 6 (ref 5–15)
BUN: 25 mg/dL — AB (ref 8–23)
CHLORIDE: 101 mmol/L (ref 98–111)
CO2: 29 mmol/L (ref 22–32)
Calcium: 8.7 mg/dL — ABNORMAL LOW (ref 8.9–10.3)
Creatinine, Ser: 0.76 mg/dL (ref 0.44–1.00)
GFR calc Af Amer: >60 mL/min (ref >60)
GFR calc non Af Amer: >60 mL/min (ref >60)
Glucose, Bld: 230 mg/dL — ABNORMAL HIGH (ref 70–99)
Potassium: 4.7 mmol/L (ref 3.5–5.1)
Sodium: 136 mmol/L (ref 135–145)

## 2019-01-04 LAB — HEMOGLOBIN A1C
Hgb A1c MFr Bld: 7.6 % — ABNORMAL HIGH (ref 4.8–5.6)
Mean Plasma Glucose: 171.42 mg/dL

## 2019-01-04 MED ORDER — VANCOMYCIN HCL 10 G IV SOLR
2000.0000 mg | Freq: Once | INTRAVENOUS | Status: DC
  Filled 2019-01-04: qty 2000

## 2019-01-04 MED ORDER — SODIUM CHLORIDE 0.9 % IV SOLN
1.0000 g | Freq: Two times a day (BID) | INTRAVENOUS | Status: DC
  Administered 2019-01-04 – 2019-01-07 (×7): 1 g via INTRAVENOUS
  Filled 2019-01-04 (×7): qty 1

## 2019-01-04 MED ORDER — MUPIROCIN 2 % EX OINT
1.0000 "application " | TOPICAL_OINTMENT | Freq: Two times a day (BID) | CUTANEOUS | Status: AC
  Administered 2019-01-04 – 2019-01-08 (×10): 1 via NASAL
  Filled 2019-01-04 (×2): qty 22

## 2019-01-04 MED ORDER — CHLORHEXIDINE GLUCONATE CLOTH 2 % EX PADS
6.0000 | MEDICATED_PAD | Freq: Every day | CUTANEOUS | Status: AC
  Administered 2019-01-05 – 2019-01-08 (×3): 6 via TOPICAL

## 2019-01-04 MED ORDER — VANCOMYCIN HCL 10 G IV SOLR
1500.0000 mg | INTRAVENOUS | Status: DC
  Administered 2019-01-05 – 2019-01-06 (×2): 1500 mg via INTRAVENOUS
  Filled 2019-01-04 (×3): qty 1500

## 2019-01-04 MED ORDER — VANCOMYCIN HCL 10 G IV SOLR
2500.0000 mg | Freq: Once | INTRAVENOUS | Status: AC
  Administered 2019-01-04: 2500 mg via INTRAVENOUS
  Filled 2019-01-04: qty 2500

## 2019-01-04 NOTE — Progress Notes (Signed)
PROGRESS NOTE    [name]  TMH:[id number] DOB: 28-Dec-1950 DOA: 01/01/2019 PCP: Angelica Chessman, MD    Brief Narrative:  Kelli Rivera is a 68 y.o. female with medical history significant for COPD, nocturnal supplemental oxygen requirement, insulin-dependent diabetes mellitus, chronic leukocytosis, rheumatoid arthritis,presented to the emergency department for worsening of shortness of breath, increased productive cough, and wheezing.  The patient reports that symptoms developed proximately 2 weeks ago, she was started on prednisone taper and an unknown antibiotic, had no significant improvement, and was started on a second course of steroids and antibiotic.  She reports that she may have improved some for a day or so with these outpatient treatments, but has begun to worsen again.    Work-up in the emergency department patient was found to have oxygen saturations of 90% on 2 L of oxygen, tachypneic, tachycardic with heart rate in 120s.  KG showed sinus tachycardia.  Chest x-ray was negative for any acute cardiopulmonary disease.  Patient had initial lactic acid of 2.1.  Patient continued to have significant cough Assessment & Plan:   Principal Problem:   COPD with acute exacerbation (HCC) Active Problems:   Leukocytosis   Diabetes mellitus type 2 in obese (HCC)   RA (rheumatoid arthritis) (HCC)   OSA (obstructive sleep apnea)   Chronic diastolic CHF (congestive heart failure) (HCC)   Mild renal insufficiency   ## COPD with acute exacerbation  - Presents with increased SOB, productive cough, and wheezing  - Failed outpatient treatment with 2 courses of steroids and antibiotics -Keep the patient on Rocephin, Zithromax -Continue with the duo nebs, Solu-Medrol -Slow but gradual improvement -Patient still has significant bilateral wheezing with prolonged expiratory phase -Concerning about patient's significant cough, shortness of breath despite treatment, get CT chest without contrast.   ##Leukocytosis  - WBC is 41,300on admission ->27.6, up from 23,500 in September  - She has seen hematology previously for chronic leukocytosis  -  on systemic steroids recently for COPD exacerbation, currently on antibiotic for severe COPD exacerbation, culture blood if febrile    -Trending down  ## Chronic diastolic CHF  -Keep the patient on IV Lasix daily and follow-up -Some improvement from yesterday  ##Mild renal insufficiency  - SCr is 1.11 on admission, up from 0.92 in September  - She was given 500 cc NS in ED  - Renally-dose medications -Resolved  ## Insulin-dependent DM  with hyperglycemia -Hold oral medications -Get hemoglobin A1c-7.6 -Secondary to steroids -on Lantus 35 units twice daily, increase to 40 units twice daily -Add NovoLog 10 units with each meal -Follow-up on sliding scale insulin    ##Rheumatoid arthritis  -Hold oral medications for now  Morbid obesity -As BMI of 44 -Counseling done regarding diet and exercise -Encouraged patient to sit in the chair, walk tolerated  DVT prophylaxis: Lovenox  code Status: Full  family Communication: Daughter  disposition Plan: Home in 2 to 3 days    Antimicrobials:  Rocephin 01/02/2019 Doxycycline 01/01/2019  Subjective: She states mild improvement with cough, however continues to have diffuse wheezing, shortness of breath with the talking  Objective: Vitals:   01/04/19 0500 01/04/19 0722 01/04/19 0725 01/04/19 0827  BP:    (!) 116/54  Pulse:  60  63  Resp:  18    Temp:    97.7 F (36.5 C)  TempSrc:    Oral  SpO2:  95% 95% 95%  Weight: 115.5 kg     Height:  Intake/Output Summary (Last 24 hours) at 01/04/2019 1343 Last data filed at 01/04/2019 0900 Gross per 24 hour  Intake 3 ml  Output 1500 ml  Net -1497 ml   Filed Weights   01/01/19 2238 01/03/19 0500 01/04/19 0500  Weight: 112.8 kg 113.1 kg 115.5 kg    Examination:  General exam: Appears calm and comfortable  Respiratory  system: Bilateral diffuse wheezing, prolonged expiratory phase cardiovascular system: S1 & S2 heard, RRR. No JVD, murmurs, rubs, gallops or clicks. No pedal edema. Gastrointestinal system: Abdomen is nondistended, soft and nontender. No organomegaly or masses felt. Normal bowel sounds heard. Central nervous system: Alert and oriented. No focal neurological deficits. Extremities: Symmetric 5 x 5 power. Skin: No rashes, lesions or ulcers Psychiatry: Judgement and insight appear normal. Mood & affect appropriate.     Data Reviewed: I have personally reviewed following labs and imaging studies  CBC: Recent Labs  Lab 01/01/19 1717 01/02/19 0250 01/03/19 0226 01/04/19 0218  WBC 41.3* 42.2* 31.5* 27.6*  NEUTROABS 33.8* 37.5*  --   --   HGB 13.9 12.7 11.3* 11.3*  HCT 46.5* 42.1 38.6 38.6  MCV 77.8* 77.1* 77.0* 77.0*  PLT 236 202 184 181   Basic Metabolic Panel: Recent Labs  Lab 01/01/19 1717 01/02/19 0250 01/03/19 0226 01/04/19 0218  NA 134* 136 135 136  K 3.7 4.8 4.7 4.7  CL 100 101 102 101  CO2 27 24 26 29   GLUCOSE 228* 447* 407* 230*  BUN 14 16 20  25*  CREATININE 1.11* 1.06* 0.82 0.76  CALCIUM 8.6* 8.7* 8.7* 8.7*   GFR: Estimated Creatinine Clearance: 81.3 mL/min (by C-G formula based on SCr of 0.76 mg/dL). Liver Function Tests: Recent Labs  Lab 01/01/19 1717  AST 14*  ALT 19  ALKPHOS 116  BILITOT 0.7  PROT 6.3*  ALBUMIN 3.0*   No results for input(s): LIPASE, AMYLASE in the last 168 hours. No results for input(s): AMMONIA in the last 168 hours. Coagulation Profile: No results for input(s): INR, PROTIME in the last 168 hours. Cardiac Enzymes: No results for input(s): CKTOTAL, CKMB, CKMBINDEX, TROPONINI in the last 168 hours. BNP (last 3 results) No results for input(s): PROBNP in the last 8760 hours. HbA1C: Recent Labs    01/04/19 0218  HGBA1C 7.6*   CBG: Recent Labs  Lab 01/03/19 1124 01/03/19 1643 01/03/19 2202 01/04/19 0826 01/04/19 1152   GLUCAP 417* 237* 167* 272* 336*   Lipid Profile: No results for input(s): CHOL, HDL, LDLCALC, TRIG, CHOLHDL, LDLDIRECT in the last 72 hours. Thyroid Function Tests: No results for input(s): TSH, T4TOTAL, FREET4, T3FREE, THYROIDAB in the last 72 hours. Anemia Panel: No results for input(s): VITAMINB12, FOLATE, FERRITIN, TIBC, IRON, RETICCTPCT in the last 72 hours. Sepsis Labs: Recent Labs  Lab 01/01/19 1717 01/01/19 2032  LATICACIDVEN 2.1* 1.5    Recent Results (from the past 240 hour(s))  MRSA PCR Screening     Status: Abnormal   Collection Time: 01/02/19 10:27 AM  Result Value Ref Range Status   MRSA by PCR POSITIVE (A) NEGATIVE Final    Comment:        The GeneXpert MRSA Assay (FDA approved for NASAL specimens only), is one component of a comprehensive MRSA colonization surveillance program. It is not intended to diagnose MRSA infection nor to guide or monitor treatment for MRSA infections. RESULT CALLED TO, READ BACK BY AND VERIFIED WITH: Linward Foster RN 12:30 01/02/19 ([email or web address]) Performed at [facility], [address], [place], [place]  [id number]          Radiology Studies: No results found.      Scheduled Meds: . budesonide (PULMICORT) nebulizer solution  0.5 mg Nebulization BID  . [START ON 01/05/2019] Chlorhexidine Gluconate Cloth  6 each Topical Q0600  . doxycycline  200 mg Oral Daily  . enoxaparin (LOVENOX) injection  40 mg Subcutaneous Q24H  . furosemide  40 mg Intravenous Daily  . insulin aspart  0-15 Units Subcutaneous TID WC  . insulin aspart  0-5 Units Subcutaneous QHS  . insulin aspart  10 Units Subcutaneous TID WC  . insulin glargine  35 Units Subcutaneous BID  . ipratropium-albuterol  3 mL Nebulization TID  . methylPREDNISolone (SOLU-MEDROL) injection  40 mg Intravenous Q8H  . mupirocin ointment  1 application Nasal BID  . sodium chloride flush  3 mL Intravenous Q12H  . sodium chloride flush  3 mL Intravenous Q12H   Continuous  Infusions: . sodium chloride 10 mL (01/02/19 0135)  . cefTRIAXone (ROCEPHIN)  IV 1 g (01/04/19 0905)     LOS: 3 days    Time spent: 40 minutes   Eria Lozoya, MD [facility] Pager [phone]  If 7PM-7AM, please contact night-coverage [email or web address] Password [email or web address] 01/04/2019, 1:43 PM

## 2019-01-04 NOTE — Progress Notes (Signed)
Inpatient Diabetes Program Recommendations  AACE/ADA: New Consensus Statement on Inpatient Glycemic Control (2015)  Target Ranges:  Prepandial:   less than 140 mg/dL      Peak postprandial:   less than 180 mg/dL (1-2 hours)      Critically ill patients:  140 - 180 mg/dL   Lab Results  Component Value Date   GLUCAP 272 (H) 01/04/2019   HGBA1C 7.6 (H) 01/04/2019    Review of Glycemic Control Results for Kelli Rivera, Kelli Rivera (MRN [id number]) as of 01/04/2019 10:18  Ref. Range 01/03/2019 11:24 01/03/2019 16:43 01/03/2019 22:02 01/04/2019 08:26  Glucose-Capillary Latest Ref Range: 70 - 99 mg/dL 071 (H) 219 (H) 758 (H) 272 (H)   Admit with:SOB/ COPD  History:DM, COPD  Home DM Meds:Toujeo 30 units QHS Trulicity 1.5 mg Qweek Metformin 1000 mg QHS  Current Orders:Lantus 35 units bid Novolog Moderate Correction Scale/ SSI (0-15 units) TID AC + HS                            Novolog 10 units TID  Inpatient Diabetes Program Recommendations:    Consider increasing Lantus to 40 units BID.  Thanks, Lujean Rave, MSN, RNC-OB Diabetes Coordinator [phone] (8a-5p)

## 2019-01-04 NOTE — Progress Notes (Signed)
Pharmacy Antibiotic Note  Kelli Rivera is a 68 y.o. female admitted on 01/01/2019 with pneumonia.  Pharmacy has been consulted for Vancomycin dosing. Also started on Cefepime per MD.  Plan: Vancomycin  2500mg  IV x1 now then Vancomycin 1500 mg IV Q 24 hrs. Goal AUC 400-550. Expected AUC:  496 SCr used: 0.76   Height: 5\' 3"  (160 cm) Weight: 254 lb 10.1 oz (115.5 kg) IBW/kg (Calculated) : 52.4  Temp (24hrs), Avg:98 F (36.7 C), Min:97.7 F (36.5 C), Max:98.2 F (36.8 C)  Recent Labs  Lab 01/01/19 1717 01/01/19 2032 01/02/19 0250 01/03/19 0226 01/04/19 0218  WBC 41.3*  --  42.2* 31.5* 27.6*  CREATININE 1.11*  --  1.06* 0.82 0.76  LATICACIDVEN 2.1* 1.5  --   --   --     Estimated Creatinine Clearance: 81.3 mL/min (by C-G formula based on SCr of 0.76 mg/dL).    Allergies  Allergen Reactions  . Liraglutide Nausea And Vomiting  . Pioglitazone Swelling  . Amoxicillin-Pot Clavulanate Nausea And Vomiting    Did it involve swelling of the face/tongue/throat, SOB, or low BP? No Did it involve sudden or severe rash/hives, skin peeling, or any reaction on the inside of your mouth or nose? No Did you need to seek medical attention at a hospital or doctor's office? No When did it last happen?less than 10 years If all above answers are "NO", may proceed with cephalosporin use.     Antimicrobials this admission: Vancomycin 3/12>> Cefepime 3/12>>  Dose adjustments this admission:   Microbiology results: 3/10 MRSA PCR : positive   Thank you for allowing pharmacy to be a part of this patient's care. Noah Delaine, RPh Clinical Pharmacist Please check AMION for all [facility] Pharmacy phone numbers After 10:00 PM, call Main Pharmacy [phone] 01/04/2019 3:22 PM

## 2019-01-04 NOTE — Evaluation (Signed)
Physical Therapy Evaluation Patient Details Name: Kelli Rivera MRN: [id number] DOB: [birth year] Today's Date: 01/04/2019   History of Present Illness  Pt is a 68 y/o female who presents s/p COPD exacerbation. PMH significant for morbid obesity, DM, COPD.   Clinical Impression  Pt admitted with above diagnosis. Pt currently with functional limitations due to the deficits listed below (see PT Problem List). At the time of PT eval pt was able to demonstrate transfers with light min assist for balance support and safety without an AD. Pt's daughter very supportive and attempting to assist pt physically during session. Pt reports feeling back to baseline of function, however it doesn't appear pt is very active at baseline. Pt on RA throughout session and sats at 91% after transition to recliner. Pt declining further mobility as lunch tray had arrived. Home health PT may be beneficial to improve overall strength and tolerance for functional activity, however unsure if pt will decline services as she is near baseline. Acutely, pt will benefit from skilled PT to increase their independence and safety with mobility to allow discharge to the venue listed below.       Follow Up Recommendations Home health PT;Supervision for mobility/OOB(May decline as she is near baseline of function)    Equipment Recommendations  None recommended by PT    Recommendations for Other Services       Precautions / Restrictions Precautions Precautions: Fall Precaution Comments: Watch O2 sats with activity Restrictions Weight Bearing Restrictions: No      Mobility  Bed Mobility Overal bed mobility: Modified Independent Bed Mobility: Supine to Sit           General bed mobility comments: Pt sitting up in bed with HOB elevated. Pt transitioned into long sitting and then to EOB without difficulty. Pt removed and replaced her own Purewick catheter.  Transfers Overall transfer level: Needs assistance Equipment  used: None Transfers: Sit to/from [employer] Sit to Stand: Min assist Stand pivot transfers: Min assist       General transfer comment: Assist for power-up to full stand as well as for balance support during SPT bed>chair. Pt unwilling to progress mobility as lunch tray was delivered.   Ambulation/Gait                Stairs            Wheelchair Mobility    Modified Rankin (Stroke Patients Only)       Balance Overall balance assessment: Needs assistance Sitting-balance support: Feet supported;No upper extremity supported Sitting balance-Leahy Scale: Normal     Standing balance support: No upper extremity supported;During functional activity Standing balance-Leahy Scale: Poor Standing balance comment: Requires min assist for balance during dynamic standing activity                             Pertinent Vitals/Pain Pain Assessment: No/denies pain    Home Living Family/patient expects to be discharged to:: Private residence Living Arrangements: Alone Available Help at Discharge: Family;Available 24 hours/day Type of Home: Apartment Home Access: Level entry   Entrance Stairs-Number of Steps: 1 threshold step Home Layout: One level Home Equipment: Cane - single point;Walker - 2 wheels;Shower seat      Prior Function Level of Independence: Independent;Independent with assistive device(s)         Comments: Pt reports she occasionally uses the walker or cane. States she is independent with ADL's but daughter drives and gets groceries  for her.      Hand Dominance   Dominant Hand: Right    Extremity/Trunk Assessment   Upper Extremity Assessment Upper Extremity Assessment: Defer to OT evaluation    Lower Extremity Assessment Lower Extremity Assessment: Generalized weakness(Likely baseline)    Cervical / Trunk Assessment Cervical / Trunk Assessment: Other exceptions Cervical / Trunk Exceptions: Forward head posture  and rounded shoulders  Communication   Communication: No difficulties  Cognition Arousal/Alertness: Awake/alert Behavior During Therapy: WFL for tasks assessed/performed Overall Cognitive Status: Within Functional Limits for tasks assessed                                        General Comments      Exercises     Assessment/Plan    PT Assessment Patient needs continued PT services  PT Problem List Decreased strength;Decreased activity tolerance;Decreased balance;Decreased mobility;Decreased knowledge of use of DME;Decreased safety awareness;Decreased knowledge of precautions;Cardiopulmonary status limiting activity;Obesity       PT Treatment Interventions DME instruction;Gait training;Functional mobility training;Therapeutic activities;Therapeutic exercise;Neuromuscular re-education;Patient/family education    PT Goals (Current goals can be found in the Care Plan section)  Acute Rehab PT Goals Patient Stated Goal: Eat her lunch, go home tomorrow PT Goal Formulation: With patient/family Time For Goal Achievement: 01/11/19 Potential to Achieve Goals: Good    Frequency Min 3X/week   Barriers to discharge        Co-evaluation               AM-PAC PT "6 Clicks" Mobility  Outcome Measure Help needed turning from your back to your side while in a flat bed without using bedrails?: None Help needed moving from lying on your back to sitting on the side of a flat bed without using bedrails?: None Help needed moving to and from a bed to a chair (including a wheelchair)?: None Help needed standing up from a chair using your arms (e.g., wheelchair or bedside chair)?: A Little Help needed to walk in hospital room?: A Little Help needed climbing 3-5 steps with a railing? : A Little 6 Click Score: 21    End of Session Equipment Utilized During Treatment: Oxygen Activity Tolerance: Patient tolerated treatment well Patient left: in chair;with call bell/phone  within reach;with family/visitor present Nurse Communication: Mobility status;Other (comment)(O2 status`) PT Visit Diagnosis: Unsteadiness on feet (R26.81);Muscle weakness (generalized) (M62.81)    Time: 1330-1350 PT Time Calculation (min) (ACUTE ONLY): 20 min   Charges:   PT Evaluation $PT Eval Moderate Complexity: 1 Mod          Conni Slipper, PT, DPT Acute Rehabilitation Services Pager: [phone] Office: [phone]   Marylynn Pearson 01/04/2019, 2:13 PM

## 2019-01-04 NOTE — Care Management Important Message (Signed)
Important Message  Patient Details  Name: ELIESE SHOJI MRN: [id number] Date of Birth: 08-13-1951   Medicare Important Message Given:  Yes    Lawerance Sabal, RN 01/04/2019, 12:14 PM

## 2019-01-05 LAB — GLUCOSE, CAPILLARY
Glucose-Capillary: 144 mg/dL — ABNORMAL HIGH (ref 70–99)
Glucose-Capillary: 168 mg/dL — ABNORMAL HIGH (ref 70–99)
Glucose-Capillary: 193 mg/dL — ABNORMAL HIGH (ref 70–99)
Glucose-Capillary: 256 mg/dL — ABNORMAL HIGH (ref 70–99)

## 2019-01-05 MED ORDER — GUAIFENESIN-DM 100-10 MG/5ML PO SYRP: 5.0000 mL | ORAL_SOLUTION | ORAL | Status: DC | PRN

## 2019-01-05 MED ORDER — ENOXAPARIN SODIUM 60 MG/0.6ML ~~LOC~~ SOLN
55.0000 mg | SUBCUTANEOUS | Status: DC
  Administered 2019-01-05 – 2019-01-09 (×5): 55 mg via SUBCUTANEOUS
  Filled 2019-01-05 (×5): qty 0.6

## 2019-01-05 MED ORDER — GUAIFENESIN 200 MG PO TABS
400.0000 mg | ORAL_TABLET | Freq: Two times a day (BID) | ORAL | Status: DC
  Administered 2019-01-05 – 2019-01-07 (×5): 400 mg via ORAL
  Filled 2019-01-05 (×5): qty 2

## 2019-01-05 MED ORDER — INSULIN GLARGINE 100 UNIT/ML ~~LOC~~ SOLN
40.0000 [IU] | Freq: Two times a day (BID) | SUBCUTANEOUS | Status: DC
  Administered 2019-01-05 – 2019-01-08 (×6): 40 [IU] via SUBCUTANEOUS
  Filled 2019-01-05 (×7): qty 0.4

## 2019-01-05 NOTE — Progress Notes (Signed)
PROGRESS NOTE    Kelli Rivera  IRJ:[id number] DOB: [birth year] DOA: 01/01/2019 PCP: Angelica Chessman, MD    Brief Narrative:  Kelli Rivera is a 68 y.o. female with medical history significant for COPD, nocturnal supplemental oxygen requirement, insulin-dependent diabetes mellitus, chronic leukocytosis, rheumatoid arthritis,presented to the emergency department for worsening of shortness of breath, increased productive cough, and wheezing.  The patient reports that symptoms developed proximately 2 weeks ago, she was started on prednisone taper and an unknown antibiotic, had no significant improvement, and was started on a second course of steroids and antibiotic.  She reports that she may have improved some for a day or so with these outpatient treatments, but has begun to worsen again.    Work-up in the emergency department patient was found to have oxygen saturations of 90% on 2 L of oxygen, tachypneic, tachycardic with heart rate in 120s.  KG showed sinus tachycardia.  Chest x-ray was negative for any acute cardiopulmonary disease.  Patient had initial lactic acid of 2.1.  Patient continued to have significant cough Assessment & Plan:   Principal Problem:   COPD with acute exacerbation (HCC) Active Problems:   Leukocytosis   Diabetes mellitus type 2 in obese (HCC)   RA (rheumatoid arthritis) (HCC)   OSA (obstructive sleep apnea)   Chronic diastolic CHF (congestive heart failure) (HCC)   Mild renal insufficiency   ## COPD with acute exacerbation  - Presents with increased SOB, productive cough, and wheezing  - Failed outpatient treatment with 2 courses of steroids and antibiotics -Currently on Comycin, cefepime covering for MRSA, Pseudomonas -Continue with the duo nebs, Solu-Medrol -Slow but gradual improvement -Patient still has significant bilateral wheezing with prolonged expiratory phase -Concerning about patient's significant cough, shortness of breath despite treatment, get CT  chest without contrast- no acute abnormality.  ##Leukocytosis  - WBC is 41,300on admission 44->35->27.6, up from 23,500 in September  - She has seen hematology previously for chronic leukocytosis  -  on systemic steroids recently for COPD exacerbation, currently on antibiotic for severe COPD exacerbation, culture blood if febrile    -Trending down  ## Chronic diastolic CHF  -Keep the patient on IV Lasix daily and follow-up -Some improvement from yesterday  ##Mild renal insufficiency  - SCr is 1.11 on admission, up from 0.92 in September  - She was given 500 cc NS in ED  - Renally-dose medications -Resolved  ## Insulin-dependent DM  with hyperglycemia -Hold oral medications -Get hemoglobin A1c-7.6 -Secondary to steroids -on Lantus 35 units twice daily, increase to 40 units twice daily -Add NovoLog 10 units with each meal -Follow-up on sliding scale insulin    ##Rheumatoid arthritis  -Hold oral medications for now  Morbid obesity -As BMI of 44 -Counseling done regarding diet and exercise -Encouraged patient to sit in the chair, walk tolerated  DVT prophylaxis: Lovenox  code Status: Full  family Communication: Daughter  disposition Plan: Home in 2 to 3 days    Antimicrobials:  Rocephin 01/02/2019-01/04/2019 Doxycycline 01/01/2019- 01/04/2019 Vancomycin 01/04/2019- Cefepime 01/04/2019-   Subjective: Patient continues to have significant wet, productive cough. Objective: Vitals:   01/05/19 0607 01/05/19 0829 01/05/19 0833 01/05/19 0852  BP:    130/67  Pulse:  60  68  Resp:  18  (!) 22  Temp:    97.7 F (36.5 C)  TempSrc:    Oral  SpO2:  96% 96% 96%  Weight: 110.9 kg     Height:  Intake/Output Summary (Last 24 hours) at 01/05/2019 1305 Last data filed at 01/05/2019 1300 Gross per 24 hour  Intake 446 ml  Output 2700 ml  Net -2254 ml   Filed Weights   01/03/19 0500 01/04/19 0500 01/05/19 [id number]  Weight: 113.1 kg 115.5 kg 110.9 kg     Examination:  General exam: Appears calm and comfortable  Respiratory system: Bilateral diffuse wheezing, prolonged expiratory phase cardiovascular system: S1 & S2 heard, RRR. No JVD, murmurs, rubs, gallops or clicks. No pedal edema. Gastrointestinal system: Abdomen is nondistended, soft and nontender. No organomegaly or masses felt. Normal bowel sounds heard. Central nervous system: Alert and oriented. No focal neurological deficits. Extremities: Symmetric 5 x 5 power. Skin: No rashes, lesions or ulcers Psychiatry: Judgement and insight appear normal. Mood & affect appropriate.     Data Reviewed: I have personally reviewed following labs and imaging studies  CBC: Recent Labs  Lab 01/01/19 1717 01/02/19 0250 01/03/19 0226 01/04/19 0218  WBC 41.3* 42.2* 31.5* 27.6*  NEUTROABS 33.8* 37.5*  --   --   HGB 13.9 12.7 11.3* 11.3*  HCT 46.5* 42.1 38.6 38.6  MCV 77.8* 77.1* 77.0* 77.0*  PLT 236 202 184 181   Basic Metabolic Panel: Recent Labs  Lab 01/01/19 1717 01/02/19 0250 01/03/19 0226 01/04/19 0218  NA 134* 136 135 136  K 3.7 4.8 4.7 4.7  CL 100 101 102 101  CO2 27 24 26 29   GLUCOSE 228* 447* 407* 230*  BUN 14 16 20  25*  CREATININE 1.11* 1.06* 0.82 0.76  CALCIUM 8.6* 8.7* 8.7* 8.7*   GFR: Estimated Creatinine Clearance: 79.4 mL/min (by C-G formula based on SCr of 0.76 mg/dL). Liver Function Tests: Recent Labs  Lab 01/01/19 1717  AST 14*  ALT 19  ALKPHOS 116  BILITOT 0.7  PROT 6.3*  ALBUMIN 3.0*   No results for input(s): LIPASE, AMYLASE in the last 168 hours. No results for input(s): AMMONIA in the last 168 hours. Coagulation Profile: No results for input(s): INR, PROTIME in the last 168 hours. Cardiac Enzymes: No results for input(s): CKTOTAL, CKMB, CKMBINDEX, TROPONINI in the last 168 hours. BNP (last 3 results) No results for input(s): PROBNP in the last 8760 hours. HbA1C: Recent Labs    01/04/19 0218  HGBA1C 7.6*   CBG: Recent Labs  Lab  01/04/19 1152 01/04/19 1730 01/04/19 2124 01/05/19 0849 01/05/19 1120  GLUCAP 336* 108* 231* 144* 168*   Lipid Profile: No results for input(s): CHOL, HDL, LDLCALC, TRIG, CHOLHDL, LDLDIRECT in the last 72 hours. Thyroid Function Tests: No results for input(s): TSH, T4TOTAL, FREET4, T3FREE, THYROIDAB in the last 72 hours. Anemia Panel: No results for input(s): VITAMINB12, FOLATE, FERRITIN, TIBC, IRON, RETICCTPCT in the last 72 hours. Sepsis Labs: Recent Labs  Lab 01/01/19 1717 01/01/19 2032  LATICACIDVEN 2.1* 1.5    Recent Results (from the past 240 hour(s))  MRSA PCR Screening     Status: Abnormal   Collection Time: 01/02/19 10:27 AM  Result Value Ref Range Status   MRSA by PCR POSITIVE (A) NEGATIVE Final    Comment:        The GeneXpert MRSA Assay (FDA approved for NASAL specimens only), is one component of a comprehensive MRSA colonization surveillance program. It is not intended to diagnose MRSA infection nor to guide or monitor treatment for MRSA infections. RESULT CALLED TO, READ BACK BY AND VERIFIED WITH: Linward Foster. Duncan RN 12:30 01/02/19 ([email or web address]) Performed at [facility], [address] [place], [place]  [address]          Radiology Studies: Ct Chest Wo Contrast  Result Date: 01/04/2019 CLINICAL DATA:  68 year old female with persistent cough. EXAM: CT CHEST WITHOUT CONTRAST TECHNIQUE: Multidetector CT imaging of the chest was performed following the standard protocol without IV contrast. COMPARISON:  Chest radiographs 01/01/2019. CT Chest, Abdomen, and Pelvis 09/24/2017. FINDINGS: Cardiovascular: Mild Calcified aortic atherosclerosis. Chronic central pulmonary artery enlargement appears stable since 2018, better demonstrated on the prior study with contrast. Mild cardiomegaly is stable. No pericardial effusion. Vascular patency is not evaluated in the absence of IV contrast. Mediastinum/Nodes: Stable thyromegaly. No mediastinal lymphadenopathy. Lungs/Pleura:  Major airways are patent. Evidence of centrilobular emphysema in both lungs. Mild scarring in the lingula and lateral basal segment of the left lower lobe is stable. Mild scarring in the medial segment of the right middle lobe is stable. No acute pulmonary opacity or pleural effusion. Upper Abdomen: Surgically absent gallbladder. Negative visible noncontrast liver, spleen, pancreas, adrenal glands, kidneys, and bowel. Musculoskeletal: No acute osseous abnormality identified. IMPRESSION: 1. No acute findings in the chest. 2. Chronic Emphysema (ICD10-J43.9). Chronic central pulmonary artery enlargement suggesting a degree of pulmonary artery hypertension. Electronically Signed   By: Odessa Fleming M.D.   On: 01/04/2019 19:14        Scheduled Meds:  budesonide (PULMICORT) nebulizer solution  0.5 mg Nebulization BID   Chlorhexidine Gluconate Cloth  6 each Topical Q0600   doxycycline  200 mg Oral Daily   enoxaparin (LOVENOX) injection  40 mg Subcutaneous Q24H   furosemide  40 mg Intravenous Daily   insulin aspart  0-15 Units Subcutaneous TID WC   insulin aspart  0-5 Units Subcutaneous QHS   insulin aspart  10 Units Subcutaneous TID WC   insulin glargine  35 Units Subcutaneous BID   ipratropium-albuterol  3 mL Nebulization TID   methylPREDNISolone (SOLU-MEDROL) injection  40 mg Intravenous Q8H   mupirocin ointment  1 application Nasal BID   sodium chloride flush  3 mL Intravenous Q12H   sodium chloride flush  3 mL Intravenous Q12H   Continuous Infusions:  sodium chloride 10 mL (01/02/19 0135)   ceFEPime (MAXIPIME) IV 1 g (01/05/19 0921)   vancomycin       LOS: 4 days    Time spent: 40 minutes   Deanta Mincey, MD [facility] Pager [phone]  If 7PM-7AM, please contact night-coverage [email or web address] Password [email or web address] 01/05/2019, 1:05 PM

## 2019-01-06 LAB — BASIC METABOLIC PANEL
Anion gap: 7 (ref 5–15)
BUN: 32 mg/dL — ABNORMAL HIGH (ref 8–23)
CHLORIDE: 98 mmol/L (ref 98–111)
CO2: 30 mmol/L (ref 22–32)
Calcium: 8.6 mg/dL — ABNORMAL LOW (ref 8.9–10.3)
Creatinine, Ser: 0.89 mg/dL (ref 0.44–1.00)
GFR calc non Af Amer: >60 mL/min (ref >60)
Glucose, Bld: 274 mg/dL — ABNORMAL HIGH (ref 70–99)
Potassium: 4.3 mmol/L (ref 3.5–5.1)
Sodium: 135 mmol/L (ref 135–145)

## 2019-01-06 LAB — CBC
HEMATOCRIT: 39.1 % (ref 36.0–46.0)
Hemoglobin: 11.9 g/dL — ABNORMAL LOW (ref 12.0–15.0)
MCH: 23.3 pg — ABNORMAL LOW (ref 26.0–34.0)
MCHC: 30.4 g/dL (ref 30.0–36.0)
MCV: 76.7 fL — ABNORMAL LOW (ref 80.0–100.0)
Platelets: 190 10*3/uL (ref 150–400)
RBC: 5.1 MIL/uL (ref 3.87–5.11)
RDW: 17.5 % — ABNORMAL HIGH (ref 11.5–15.5)
WBC: 20.2 10*3/uL — AB (ref 4.0–10.5)
nRBC: 0 % (ref 0.0–0.2)

## 2019-01-06 LAB — GLUCOSE, CAPILLARY
GLUCOSE-CAPILLARY: 131 mg/dL — AB (ref 70–99)
Glucose-Capillary: 207 mg/dL — ABNORMAL HIGH (ref 70–99)
Glucose-Capillary: 189 mg/dL — ABNORMAL HIGH (ref 70–99)
Glucose-Capillary: 293 mg/dL — ABNORMAL HIGH (ref 70–99)

## 2019-01-06 MED ORDER — IPRATROPIUM-ALBUTEROL 0.5-2.5 (3) MG/3ML IN SOLN
3.0000 mL | Freq: Two times a day (BID) | RESPIRATORY_TRACT | Status: DC
  Administered 2019-01-06 – 2019-01-10 (×8): 3 mL via RESPIRATORY_TRACT
  Filled 2019-01-06 (×8): qty 3

## 2019-01-06 MED ORDER — MAGNESIUM CITRATE PO SOLN
1.0000 | Freq: Once | ORAL | Status: AC
  Administered 2019-01-06: 1 via ORAL
  Filled 2019-01-06: qty 296

## 2019-01-06 NOTE — Progress Notes (Signed)
PROGRESS NOTE    Kelli Rivera  OZY:[id number] DOB: 1951-02-10 DOA: 01/01/2019 PCP: Angelica Chessman, MD    Brief Narrative:  Kelli Rivera is a 68 y.o. female with medical history significant for COPD, nocturnal supplemental oxygen requirement, insulin-dependent diabetes mellitus, chronic leukocytosis, rheumatoid arthritis,presented to the emergency department for worsening of shortness of breath, increased productive cough, and wheezing.  The patient reports that symptoms developed proximately 2 weeks ago, she was started on prednisone taper and an unknown antibiotic, had no significant improvement, and was started on a second course of steroids and antibiotic.  She reports that she may have improved some for a day or so with these outpatient treatments, but has begun to worsen again.    Work-up in the emergency department patient was found to have oxygen saturations of 90% on 2 L of oxygen, tachypneic, tachycardic with heart rate in 120s.  EKG showed sinus tachycardia.  Chest x-ray was negative for any acute cardiopulmonary disease.  Patient had initial lactic acid of 2.1.  Patient continued to have significant cough.  Patient was initially treated with Rocephin, doxycycline, patient continued to have significant productive sputum.  However patient could not provide sputum cultures.  Considering patient's and significant cough brought in the spectrum of antibiotics with vancomycin, cefepime as patient has MRSA positive.  Patient is also continued with duo nebs, Solu-Medrol, budesonide nebs also Lasix.  Patient shows slow but gradual improvement.  Patient still has significant cough with any exertion including speaking. Assessment & Plan:   Principal Problem:   COPD with acute exacerbation (HCC) Active Problems:   Leukocytosis   Diabetes mellitus type 2 in obese (HCC)   RA (rheumatoid arthritis) (HCC)   OSA (obstructive sleep apnea)   Chronic diastolic CHF (congestive heart failure) (HCC)   Mild  renal insufficiency   ## COPD with acute exacerbation  - Presents with increased SOB, productive cough, and wheezing  - Failed outpatient treatment with 2 courses of steroids and antibiotics -Currently on Comycin, cefepime covering for MRSA, Pseudomonas -Continue with the duo nebs, Solu-Medrol -Slow but gradual improvement -Patient still has significant bilateral wheezing with prolonged expiratory phase -Concerning about patient's significant cough, shortness of breath despite treatment, get CT chest without contrast- no acute abnormality.  ##Leukocytosis  - WBC is 41,300on admission 44->35->27.6->20, up from 23,500 in September  - She has seen hematology previously for chronic leukocytosis  -  on systemic steroids recently for COPD exacerbation, currently on antibiotic for severe COPD exacerbation, culture blood if febrile    -Trending down  ## Chronic diastolic CHF  -Keep the patient on IV Lasix daily and follow-up   ##Mild renal insufficiency  - SCr is 1.11 on admission, up from 0.92 in September  - She was given 500 cc NS in ED  - Renally-dose medications -Resolved  ## Insulin-dependent DM  with hyperglycemia -Hold oral medications -Get hemoglobin A1c-7.6 -Secondary to steroids -on Lantus 40 units twice daily -Added NovoLog 10 units with each meal -Follow-up on sliding scale insulin    ##Rheumatoid arthritis  -Hold oral medications for now  ##Morbid obesity - BMI of 44 -Counseling done regarding diet and exercise -Encouraged patient to sit in the chair, walk tolerated  DVT prophylaxis: Lovenox  code Status: Full  family Communication: Daughter  disposition Plan: Home in 2 to 3 days    Antimicrobials:  Rocephin 01/02/2019-01/04/2019 Doxycycline 01/01/2019- 01/04/2019 Vancomycin 01/04/2019- Cefepime 01/04/2019-   Subjective: Patient continues to have significant wet, productive cough. Objective: Vitals:  01/06/19 0110 01/06/19 0600 01/06/19 0712 01/06/19  0728  BP: 127/75   112/71  Pulse: 72  72 67  Resp:   18 16  Temp: 98 F (36.7 C)   98.2 F (36.8 C)  TempSrc: Oral   Oral  SpO2: 91%  93% 99%  Weight:  113 kg    Height:        Intake/Output Summary (Last 24 hours) at 01/06/2019 1240 Last data filed at 01/06/2019 1015 Gross per 24 hour  Intake 2666.39 ml  Output 1900 ml  Net 766.39 ml   Filed Weights   01/04/19 0500 01/05/19 0607 01/06/19 0600  Weight: 115.5 kg 110.9 kg 113 kg    Examination:  General exam: Appears calm and comfortable  Respiratory system: Bilateral diffuse wheezing, improved air entry cardiovascular system: S1 & S2 heard, RRR. No JVD, murmurs, rubs, gallops or clicks. No pedal edema. Gastrointestinal system: Abdomen is nondistended, soft and nontender. No organomegaly or masses felt. Normal bowel sounds heard. Central nervous system: Alert and oriented. No focal neurological deficits. Extremities: Symmetric 5 x 5 power. Skin: No rashes, lesions or ulcers Psychiatry: Judgement and insight appear normal. Mood & affect appropriate.     Data Reviewed: I have personally reviewed following labs and imaging studies  CBC: Recent Labs  Lab 01/01/19 1717 01/02/19 0250 01/03/19 0226 01/04/19 0218 01/06/19 0217  WBC 41.3* 42.2* 31.5* 27.6* 20.2*  NEUTROABS 33.8* 37.5*  --   --   --   HGB 13.9 12.7 11.3* 11.3* 11.9*  HCT 46.5* 42.1 38.6 38.6 39.1  MCV 77.8* 77.1* 77.0* 77.0* 76.7*  PLT 236 202 184 181 190   Basic Metabolic Panel: Recent Labs  Lab 01/01/19 1717 01/02/19 0250 01/03/19 0226 01/04/19 0218 01/06/19 0217  NA 134* 136 135 136 135  K 3.7 4.8 4.7 4.7 4.3  CL 100 101 102 101 98  CO2 27 24 26 29 30   GLUCOSE 228* 447* 407* 230* 274*  BUN 14 16 20  25* 32*  CREATININE 1.11* 1.06* 0.82 0.76 0.89  CALCIUM 8.6* 8.7* 8.7* 8.7* 8.6*   GFR: Estimated Creatinine Clearance: 72.1 mL/min (by C-G formula based on SCr of 0.89 mg/dL). Liver Function Tests: Recent Labs  Lab 01/01/19 1717  AST 14*   ALT 19  ALKPHOS 116  BILITOT 0.7  PROT 6.3*  ALBUMIN 3.0*   No results for input(s): LIPASE, AMYLASE in the last 168 hours. No results for input(s): AMMONIA in the last 168 hours. Coagulation Profile: No results for input(s): INR, PROTIME in the last 168 hours. Cardiac Enzymes: No results for input(s): CKTOTAL, CKMB, CKMBINDEX, TROPONINI in the last 168 hours. BNP (last 3 results) No results for input(s): PROBNP in the last 8760 hours. HbA1C: Recent Labs    01/04/19 0218  HGBA1C 7.6*   CBG: Recent Labs  Lab 01/05/19 1120 01/05/19 1612 01/05/19 2152 01/06/19 0727 01/06/19 1203  GLUCAP 168* 193* 256* 207* 131*   Lipid Profile: No results for input(s): CHOL, HDL, LDLCALC, TRIG, CHOLHDL, LDLDIRECT in the last 72 hours. Thyroid Function Tests: No results for input(s): TSH, T4TOTAL, FREET4, T3FREE, THYROIDAB in the last 72 hours. Anemia Panel: No results for input(s): VITAMINB12, FOLATE, FERRITIN, TIBC, IRON, RETICCTPCT in the last 72 hours. Sepsis Labs: Recent Labs  Lab 01/01/19 1717 01/01/19 2032  LATICACIDVEN 2.1* 1.5    Recent Results (from the past 240 hour(s))  MRSA PCR Screening     Status: Abnormal   Collection Time: 01/02/19 10:27 AM  Result Value Ref  Range Status   MRSA by PCR POSITIVE (A) NEGATIVE Final    Comment:        The GeneXpert MRSA Assay (FDA approved for NASAL specimens only), is one component of a comprehensive MRSA colonization surveillance program. It is not intended to diagnose MRSA infection nor to guide or monitor treatment for MRSA infections. RESULT CALLED TO, READ BACK BY AND VERIFIED WITH: Linward Foster RN 12:30 01/02/19 ([email or web address]) Performed at [facility], [address], [place], [place] [address]          Radiology Studies: Ct Chest Wo Contrast  Result Date: 01/04/2019 CLINICAL DATA:  68 year old female with persistent cough. EXAM: CT CHEST WITHOUT CONTRAST TECHNIQUE: Multidetector CT imaging of the chest was  performed following the standard protocol without IV contrast. COMPARISON:  Chest radiographs 01/01/2019. CT Chest, Abdomen, and Pelvis 09/24/2017. FINDINGS: Cardiovascular: Mild Calcified aortic atherosclerosis. Chronic central pulmonary artery enlargement appears stable since 2018, better demonstrated on the prior study with contrast. Mild cardiomegaly is stable. No pericardial effusion. Vascular patency is not evaluated in the absence of IV contrast. Mediastinum/Nodes: Stable thyromegaly. No mediastinal lymphadenopathy. Lungs/Pleura: Major airways are patent. Evidence of centrilobular emphysema in both lungs. Mild scarring in the lingula and lateral basal segment of the left lower lobe is stable. Mild scarring in the medial segment of the right middle lobe is stable. No acute pulmonary opacity or pleural effusion. Upper Abdomen: Surgically absent gallbladder. Negative visible noncontrast liver, spleen, pancreas, adrenal glands, kidneys, and bowel. Musculoskeletal: No acute osseous abnormality identified. IMPRESSION: 1. No acute findings in the chest. 2. Chronic Emphysema (ICD10-J43.9). Chronic central pulmonary artery enlargement suggesting a degree of pulmonary artery hypertension. Electronically Signed   By: Odessa Fleming M.D.   On: 01/04/2019 19:14        Scheduled Meds: . budesonide (PULMICORT) nebulizer solution  0.5 mg Nebulization BID  . Chlorhexidine Gluconate Cloth  6 each Topical Q0600  . doxycycline  200 mg Oral Daily  . enoxaparin (LOVENOX) injection  55 mg Subcutaneous Q24H  . furosemide  40 mg Intravenous Daily  . guaiFENesin  400 mg Oral BID  . insulin aspart  0-15 Units Subcutaneous TID WC  . insulin aspart  0-5 Units Subcutaneous QHS  . insulin aspart  10 Units Subcutaneous TID WC  . insulin glargine  40 Units Subcutaneous BID  . ipratropium-albuterol  3 mL Nebulization BID  . methylPREDNISolone (SOLU-MEDROL) injection  40 mg Intravenous Q8H  . mupirocin ointment  1 application  Nasal BID  . sodium chloride flush  3 mL Intravenous Q12H  . sodium chloride flush  3 mL Intravenous Q12H   Continuous Infusions: . sodium chloride 10 mL (01/02/19 0135)  . ceFEPime (MAXIPIME) IV 1 g (01/06/19 1015)  . vancomycin 1,500 mg (01/05/19 1532)     LOS: 5 days    Time spent: 40 minutes   Freeman Borba, MD [facility] Pager [phone]  If 7PM-7AM, please contact night-coverage [email or web address] Password [facility] 01/06/2019, 12:40 PM

## 2019-01-06 NOTE — Progress Notes (Signed)
Physical Therapy Treatment Patient Details Name: Kelli Rivera MRN: [id number] DOB: 07-30-1951 Today's Date: 01/06/2019    History of Present Illness Pt is a 68 y/o female who presents s/p COPD exacerbation. PMH significant for morbid obesity, DM, COPD.     PT Comments    Pt progressing well with functional mobility and gait activity with vitals WNL. Pt continues to be limited d/t fatigue and respiratory symptoms that limit more progressive therapy at this time. Plan to d/c to HHPT remains appropriate at this time based on current level of function. Plan to progress pt gait training to increase endurance and independence to return home.    Follow Up Recommendations  Home health PT;Supervision for mobility/OOB     Equipment Recommendations  None recommended by PT    Recommendations for Other Services       Precautions / Restrictions Precautions Precautions: Fall Precaution Comments: Watch O2 sats with activity Restrictions Weight Bearing Restrictions: No    Mobility  Bed Mobility               General bed mobility comments: Pt in chair upon arrival and fully dressed  Transfers Overall transfer level: Needs assistance Equipment used: Rolling walker (2 wheeled) Transfers: Sit to/from Stand Sit to Stand: Min guard         General transfer comment: Min guard for safety and pt takes increased time to power up but is able to by herself.   Ambulation/Gait Ambulation/Gait assistance: Min guard Gait Distance (Feet): 125 Feet Assistive device: Rolling walker (2 wheeled) Gait Pattern/deviations: Wide base of support;Step-through pattern Gait velocity: decreased   General Gait Details: Pt required min guard for gait and was on 2 LPM throughout. Pt took 4 standing rest breaks and vitals were~ 95% spo2, 100 BPM. Pt stays close to walls as she stated that when she does not use her RW or SPC, she prefers to stay close to walls and counters to wall surf. Pt stated she felt  safe with gait and her ability to navigate her home. Pt limited d/t SOB and cough from exacerbation of COPD.    Stairs             Wheelchair Mobility    Modified Rankin (Stroke Patients Only)       Balance Overall balance assessment: Needs assistance Sitting-balance support: Feet supported;No upper extremity supported Sitting balance-Leahy Scale: Normal     Standing balance support: No upper extremity supported;During functional activity Standing balance-Leahy Scale: Poor Standing balance comment: BUE support from RW to remain upright.                             Cognition Arousal/Alertness: Awake/alert Behavior During Therapy: WFL for tasks assessed/performed Overall Cognitive Status: Within Functional Limits for tasks assessed                                        Exercises General Exercises - Lower Extremity Ankle Circles/Pumps: AROM;10 reps;Seated;Both Long Arc Quad: AROM;10 reps;Both;Seated Hip Flexion/Marching: AROM;10 reps;Seated;Both    General Comments        Pertinent Vitals/Pain Pain Assessment: No/denies pain    Home Living                      Prior Function            PT  Goals (current goals can now be found in the care plan section) Acute Rehab PT Goals Patient Stated Goal: Get over this PT Goal Formulation: With patient Time For Goal Achievement: 01/11/19 Potential to Achieve Goals: Good    Frequency    Min 3X/week      PT Plan      Co-evaluation              AM-PAC PT "6 Clicks" Mobility   Outcome Measure  Help needed turning from your back to your side while in a flat bed without using bedrails?: None Help needed moving from lying on your back to sitting on the side of a flat bed without using bedrails?: None Help needed moving to and from a bed to a chair (including a wheelchair)?: None Help needed standing up from a chair using your arms (e.g., wheelchair or bedside chair)?:  A Little Help needed to walk in hospital room?: A Little Help needed climbing 3-5 steps with a railing? : A Little 6 Click Score: 21    End of Session Equipment Utilized During Treatment: Oxygen;Gait belt Activity Tolerance: Patient tolerated treatment well Patient left: in chair;with call bell/phone within reach Nurse Communication: Mobility status PT Visit Diagnosis: Unsteadiness on feet (R26.81);Muscle weakness (generalized) (M62.81)     Time: 1245-8099 PT Time Calculation (min) (ACUTE ONLY): 27 min  Charges:  $Gait Training: 8-22 mins $Therapeutic Exercise: 8-22 mins                     Margarita Mail, SPTA   Margarita Mail 01/06/2019, 1:19 PM

## 2019-01-07 ENCOUNTER — Inpatient Hospital Stay (HOSPITAL_COMMUNITY): Payer: Medicare Other

## 2019-01-07 DIAGNOSIS — D509 Iron deficiency anemia, unspecified: Secondary | ICD-10-CM

## 2019-01-07 LAB — COMPREHENSIVE METABOLIC PANEL
ALT: 27 U/L (ref 0–44)
AST: 14 U/L — ABNORMAL LOW (ref 15–41)
Albumin: 3.1 g/dL — ABNORMAL LOW (ref 3.5–5.0)
Alkaline Phosphatase: 76 U/L (ref 38–126)
Anion gap: 8 (ref 5–15)
BUN: 22 mg/dL (ref 8–23)
CO2: 33 mmol/L — ABNORMAL HIGH (ref 22–32)
Calcium: 8.8 mg/dL — ABNORMAL LOW (ref 8.9–10.3)
Chloride: 98 mmol/L (ref 98–111)
Creatinine, Ser: 0.65 mg/dL (ref 0.44–1.00)
GFR calc non Af Amer: >60 mL/min (ref >60)
Glucose, Bld: 171 mg/dL — ABNORMAL HIGH (ref 70–99)
Potassium: 4.5 mmol/L (ref 3.5–5.1)
Sodium: 139 mmol/L (ref 135–145)
Total Bilirubin: 0.6 mg/dL (ref 0.3–1.2)
Total Protein: 6.5 g/dL (ref 6.5–8.1)

## 2019-01-07 LAB — GLUCOSE, CAPILLARY
GLUCOSE-CAPILLARY: 204 mg/dL — AB (ref 70–99)
Glucose-Capillary: 176 mg/dL — ABNORMAL HIGH (ref 70–99)
Glucose-Capillary: 172 mg/dL — ABNORMAL HIGH (ref 70–99)
Glucose-Capillary: 162 mg/dL — ABNORMAL HIGH (ref 70–99)

## 2019-01-07 LAB — CBC WITH DIFFERENTIAL/PLATELET
Abs Immature Granulocytes: 0.92 10*3/uL — ABNORMAL HIGH (ref 0.00–0.07)
BASOS PCT: 0 %
Basophils Absolute: 0.1 10*3/uL (ref 0.0–0.1)
EOS ABS: 0 10*3/uL (ref 0.0–0.5)
Eosinophils Relative: 0 %
HCT: 44.3 % (ref 36.0–46.0)
Hemoglobin: 13 g/dL (ref 12.0–15.0)
Immature Granulocytes: 4 %
Lymphocytes Relative: 11 %
Lymphs Abs: 2.6 10*3/uL (ref 0.7–4.0)
MCH: 22.6 pg — ABNORMAL LOW (ref 26.0–34.0)
MCHC: 29.3 g/dL — ABNORMAL LOW (ref 30.0–36.0)
MCV: 77.2 fL — ABNORMAL LOW (ref 80.0–100.0)
Monocytes Absolute: 1.1 10*3/uL — ABNORMAL HIGH (ref 0.1–1.0)
Monocytes Relative: 5 %
NRBC: 0 % (ref 0.0–0.2)
Neutro Abs: 18 10*3/uL — ABNORMAL HIGH (ref 1.7–7.7)
Neutrophils Relative %: 80 %
PLATELETS: 224 10*3/uL (ref 150–400)
RBC: 5.74 MIL/uL — ABNORMAL HIGH (ref 3.87–5.11)
RDW: 18.1 % — ABNORMAL HIGH (ref 11.5–15.5)
WBC: 22.7 10*3/uL — ABNORMAL HIGH (ref 4.0–10.5)

## 2019-01-07 LAB — RESPIRATORY PANEL BY PCR
Adenovirus: NOT DETECTED
Bordetella pertussis: NOT DETECTED
CORONAVIRUS HKU1-RVPPCR: NOT DETECTED
CORONAVIRUS OC43-RVPPCR: NOT DETECTED
Chlamydophila pneumoniae: NOT DETECTED
Coronavirus 229E: NOT DETECTED
Coronavirus NL63: NOT DETECTED
Influenza A: NOT DETECTED
Influenza B: NOT DETECTED
Metapneumovirus: NOT DETECTED
Mycoplasma pneumoniae: NOT DETECTED
PARAINFLUENZA VIRUS 1-RVPPCR: NOT DETECTED
Parainfluenza Virus 2: NOT DETECTED
Parainfluenza Virus 3: NOT DETECTED
Parainfluenza Virus 4: NOT DETECTED
Respiratory Syncytial Virus: NOT DETECTED
Rhinovirus / Enterovirus: NOT DETECTED

## 2019-01-07 LAB — MAGNESIUM: Magnesium: 2.6 mg/dL — ABNORMAL HIGH (ref 1.7–2.4)

## 2019-01-07 LAB — PROCALCITONIN: Procalcitonin: <0.1 ng/mL

## 2019-01-07 LAB — PHOSPHORUS: PHOSPHORUS: 3.5 mg/dL (ref 2.5–4.6)

## 2019-01-07 MED ORDER — ARFORMOTEROL TARTRATE 15 MCG/2ML IN NEBU
15.0000 ug | INHALATION_SOLUTION | Freq: Two times a day (BID) | RESPIRATORY_TRACT | Status: DC
  Administered 2019-01-07 – 2019-01-10 (×7): 15 ug via RESPIRATORY_TRACT
  Filled 2019-01-07 (×7): qty 2

## 2019-01-07 MED ORDER — SODIUM CHLORIDE 3 % IN NEBU
4.0000 mL | INHALATION_SOLUTION | Freq: Every day | RESPIRATORY_TRACT | Status: AC
  Administered 2019-01-07: 4 mL via RESPIRATORY_TRACT
  Administered 2019-01-08 – 2019-01-09 (×2): 15 mL via RESPIRATORY_TRACT
  Filled 2019-01-07 (×3): qty 4

## 2019-01-07 MED ORDER — GUAIFENESIN ER 600 MG PO TB12
1200.0000 mg | ORAL_TABLET | Freq: Two times a day (BID) | ORAL | Status: DC
  Administered 2019-01-07 – 2019-01-10 (×7): 1200 mg via ORAL
  Filled 2019-01-07 (×7): qty 2

## 2019-01-07 MED ORDER — ADULT MULTIVITAMIN W/MINERALS CH
1.0000 | ORAL_TABLET | Freq: Every day | ORAL | Status: DC
  Administered 2019-01-07 – 2019-01-10 (×4): 1 via ORAL
  Filled 2019-01-07 (×4): qty 1

## 2019-01-07 MED ORDER — MONTELUKAST SODIUM 10 MG PO TABS
10.0000 mg | ORAL_TABLET | ORAL | Status: DC
  Administered 2019-01-08 – 2019-01-10 (×3): 10 mg via ORAL
  Filled 2019-01-07 (×3): qty 1

## 2019-01-07 MED ORDER — METHYLPREDNISOLONE SODIUM SUCC 40 MG IJ SOLR
40.0000 mg | Freq: Two times a day (BID) | INTRAMUSCULAR | Status: DC
  Administered 2019-01-08: 40 mg via INTRAVENOUS
  Filled 2019-01-07: qty 1

## 2019-01-07 MED ORDER — FLUTICASONE PROPIONATE 50 MCG/ACT NA SUSP
2.0000 | Freq: Every day | NASAL | Status: DC | PRN
  Filled 2019-01-07: qty 16

## 2019-01-07 MED ORDER — ASPIRIN EC 81 MG PO TBEC
81.0000 mg | DELAYED_RELEASE_TABLET | Freq: Every day | ORAL | Status: DC
  Administered 2019-01-07 – 2019-01-10 (×4): 81 mg via ORAL
  Filled 2019-01-07 (×4): qty 1

## 2019-01-07 MED ORDER — FUROSEMIDE 40 MG PO TABS: 40.0000 mg | ORAL_TABLET | Freq: Every day | ORAL | Status: DC

## 2019-01-07 MED ORDER — OXYBUTYNIN CHLORIDE ER 15 MG PO TB24
15.0000 mg | ORAL_TABLET | Freq: Every day | ORAL | Status: DC
  Administered 2019-01-07 – 2019-01-10 (×4): 15 mg via ORAL
  Filled 2019-01-07 (×4): qty 1

## 2019-01-07 MED ORDER — PANTOPRAZOLE SODIUM 40 MG PO TBEC
40.0000 mg | DELAYED_RELEASE_TABLET | Freq: Every day | ORAL | Status: DC
  Administered 2019-01-07 – 2019-01-10 (×4): 40 mg via ORAL
  Filled 2019-01-07 (×4): qty 1

## 2019-01-07 NOTE — Progress Notes (Signed)
PROGRESS NOTE    Kelli Rivera  Kelli Rivera DOB: 1951/09/30 DOA: 01/01/2019 PCP: Angelica Chessman, MD   Brief Narrative:  Kelli Rivera a 68 y.o.femalewith medical history significant forCOPD, nocturnal supplemental oxygen requirement, insulin-dependent diabetes mellitus, chronic leukocytosis, rheumatoid arthritis, and other comorbidities who presented to the emergency department for worsening of shortness of breath, increased productive cough, and wheezing. The patient reports that symptoms developed proximately 2 weeks ago, she was started on prednisone taper and an unknown antibiotic, had no significant improvement, and was started on a second course of steroids and antibiotic. She reports that she may have improved some for a day or so with these outpatient treatments, but has begun to worsen again.    Work-up in the emergency department patient was found to have oxygen saturations of 90% on 2 L of oxygen, tachypneic, tachycardic with heart rate in 120s.  EKG showed sinus tachycardia. Chest x-ray was negative for any acute cardiopulmonary disease.  Patient had initial lactic acid of 2.1. Patient continued to have significant cough.  Patient was initially treated with Rocephin, doxycycline, patient continued to have significant productive sputum.  However patient could not provide sputum cultures.    Considering patient's course and significant cough, her Abx were broadened to with vancomycin, cefepime as patient has MRSA positive on 3/12.  Patient was also continued with duo nebs, Solu-Medrol, budesonide nebs also Lasix.  Patient is showing slow but gradual improvement.  Patient still has significant cough with any exertion including speaking.  Antibiotics were de-ecalated to just Doxycycline today. She is breathing better today and not as rhonchrous. Will attempt to Wean her O2 to Home 2 Liters. Respiratory Virus Panel and Procalcitonin Level are being checked.  Assessment & Plan:    Principal Problem:   COPD with acute exacerbation (HCC) Active Problems:   Leukocytosis   Diabetes mellitus type 2 in obese (HCC)   RA (rheumatoid arthritis) (HCC)   OSA (obstructive sleep apnea)   Chronic diastolic CHF (congestive heart failure) (HCC)   Mild renal insufficiency  COPD with Acute Exacerbation -Presented with increased SOB, productive cough, and wheezing -Failed outpatient treatment with 2 courses of steroids and antibiotics -Will check Respiratory Virus Panel and place on Empiric Droplet Precautions  -C/w Albuterol Neb 2.5 mg Neb q4hprn Wheezing and SOB -C/w Brovana 15 mcg Neb BID, Budesonide 0.5 mg Neb BID, and DuoNeb BID -Added back Fluticasone 2 spray Each Nare Daily along with Montelukast 10 mg po every morning -Stopped current dose of Guaifenesin and changed to 1200 mg po BID -Was on Methylprednisolone 40 mg q8h and weaned to IV 40 mg q12h -Added the patient on Hypertonic Saline Nebs and Chest PT -Added Flutter Valve and Incentive Spirometry  -Was changed to  IV Vancomycin and IV Cefepime on 01/04/2019 covering for MRSA, Pseudomonas given concern for HCAP but will De-escalate and just keep on Doxycycline x1 more day given no evidence of Infection on CT Chest and CXR  -CT of the Chest showed No acute findings in the chest. Chronic Emphysema. Chronic central pulmonary artery enlargement suggesting a degree of pulmonary artery hypertension. -Repeat CXR this AM showed Lungs are essentially clear. Mild left basilar atelectasis. No focal consolidation. No pleural effusion or pneumothorax. The heart is mildly enlarged. Thoracic aortic atherosclerosis. Degenerative changes of the visualized thoracolumbar spine. -Slow but gradual improvement -Continue Supplemental O2 via [place] and Wean as tolerated -Continuous Pulse Oximetry and Maintain O2 Saturations >90% -Patient has improved and does not have significant bilateral  wheezing with prolonged expiratory phase today  -Will  attempt to Wean O2 to Home 2 Liters (currently on 3) and she will need a Home O2 Screen prior to D/C -PT recommending Home Health PT at D/C  Chronic Diastolic CHF -Strict I's/O's, Daily Weights and will now Fluid Restrict to 1500 mL -Patient is down 6 lbs since admission and she is -8.316 Liters since -Keep the patient on IV Lasix 40 mg daily for now and follow-up volume status in AM and transition to her Home po Dose when able -Continue to Monitor for S/Sx of Volume Overload  Mild Renal Insufficiency, improved -SCr is 1.11 on admission, up from 0.92 in September -She was given 500 cc NS in ED -Renally-dose medications and avoid Nephrotoxic Medications if possible and Avoid Contrast Dyes and Hypotension -BUN/Cr is now stable at 25/0.76 -Continue to Monitor and Trend Renal Function -Repeat CMP in AM   Leukocytosis -Has Chronic Leukocytosis but likely to get worse in the setting of IV Steroid Demargination -WBC was 41,300 on admission and then went to 42.2 ->31.5->27.6->20.2 and now is 22.7, up from 23,500 in September -She has seen hematology previously for chronic leukocytosis -Patient was currently on Methylprednisolone 40 mg q8h for COPD exacerbation and will wean to q12h  -Currently on antibiotic for severe COPD exacerbation and will C/w Empiric Abx as above; Will obtain Blood Cx if she becomes Febrile -MRSA PCR was Positive -Continue to Monitor for S/Sx of Infection -Repeat CBC in AM   Insulin Dependent Diabetes Mellitus Type 2 with Hyperglycemia -Currently Holding Home Metformin 1000 mg po Daily with Breakfast, Trulicity 1.5 mg sq qWeekly, and Toujeo 30 units sq qMorning  -HbA1c was 7.6 -CBG's ranging from 131-293 and likely worsened in the setting of IV Steroids -C/w Lantus 40 units BID  -C/w Moderate Novolog SSI AC/HS -Added Novolog 10 units TIDwm yesterday  -Continue to Adjust Insulin Regimen as Necessary  Rheumatoid Arthritis -Currently holding Etanercept 50 mg  Subdermal Injections  Microcytic Anemia/Hx of Iron Deficiency  -Patient's Hb/Hct went from 11.9/39.1 -> 13.0/44.3 -Check Anemia Panel in AM -Continue to Monitor for S/Sx of Bleeding -Repeat CBC in AM   Morbid Obesity -Estimated body mass index is 43.39 kg/m as calculated from the following:   Height as of this encounter: 5\' 3"  (1.6 m).   Weight as of this encounter: 111.1 kg. -Weight Loss and Dietary Counseling given  -Encouraged patient to sit in the chair, walk tolerated  GERD -C/w Pantoprazole 40 mg po Daily   HLD -Not on any Statin at Home  DVT prophylaxis: Enoxaparin 55 mg sq q24h Code Status: FULL CODE  Family Communication: No family present at bedside  Disposition Plan: Anticipate D/C Home in the next 24-48 Hours if medically stable and Respiratory status back to baseline  Consultants:   None   Procedures: None  Antimicrobials:  Anti-infectives (From admission, onward)   Start     Dose/Rate Route Frequency Ordered Stop   01/05/19 1600  vancomycin (VANCOCIN) 1,500 mg in sodium chloride 0.9 % 500 mL IVPB     1,500 mg 250 mL/hr over 120 Minutes Intravenous Every 24 hours 01/04/19 1521     01/04/19 1500  ceFEPIme (MAXIPIME) 1 g in sodium chloride 0.9 % 100 mL IVPB     1 g 200 mL/hr over 30 Minutes Intravenous Every 12 hours 01/04/19 1351     01/04/19 1500  vancomycin (VANCOCIN) 2,000 mg in sodium chloride 0.9 % 500 mL IVPB  Status:  Discontinued  2,000 mg 250 mL/hr over 120 Minutes Intravenous  Once 01/04/19 1359 01/04/19 1400   01/04/19 1500  vancomycin (VANCOCIN) 2,500 mg in sodium chloride 0.9 % 500 mL IVPB     2,500 mg 250 mL/hr over 120 Minutes Intravenous  Once 01/04/19 1400 01/04/19 1930   01/03/19 0930  cefTRIAXone (ROCEPHIN) 1 g in sodium chloride 0.9 % 100 mL IVPB  Status:  Discontinued     1 g 200 mL/hr over 30 Minutes Intravenous Every 24 hours 01/03/19 0839 01/04/19 1351   01/02/19 1000  doxycycline (VIBRA-TABS) tablet 200 mg     200 mg Oral  Daily 01/01/19 2251       Subjective: Seen And examined at bedside states that she was doing better.  Denied any chest pain, lightheadedness or dizziness.  No nausea or vomiting.  Having a cough and states that is more productive today.  Asking about chest physiotherapy.  No other concerns or complaints at this time.  Objective: Vitals:   01/07/19 0500 01/07/19 0712 01/07/19 0718 01/07/19 1212  BP:   (!) 118/91   Pulse:  73 (!) 57 70  Resp:  16 17 17   Temp:   (!) 97.5 F (36.4 C)   TempSrc:   Oral   SpO2:  97% 100% 99%  Weight: 111.1 kg     Height:        Intake/Output Summary (Last 24 hours) at 01/07/2019 1223 Last data filed at 01/07/2019 1100 Gross per 24 hour  Intake 240 ml  Output 700 ml  Net -460 ml   Filed Weights   01/05/19 0607 01/06/19 0600 01/07/19 0500  Weight: 110.9 kg 113 kg 111.1 kg   Examination: Physical Exam:  Constitutional: WN/WD, morbidly obese AAF in NAD and appears calm and comfortable Eyes: Lids and conjunctivae normal, sclerae anicteric  ENMT: External Ears, Nose appear normal. Grossly normal hearing. Mucous membranes are moist. Neck: Appears normal, supple, no cervical masses, normal ROM, no appreciable thyromegaly; no JVD Respiratory: Diminished to auscultation bilaterally with coarse breath sounds but no appreciable wheezing, rales, rhonchi or crackles. Normal respiratory effort and patient is not tachypenic. No accessory muscle use. Unlabored breathing but is wearing supplemental O2 via [place] 3 Liters Cardiovascular: RRR, no murmurs / rubs / gallops. S1 and S2 auscultated. 1+ LE extremity edema.  Abdomen: Soft, non-tender, Distended 2/2 body habitus. No masses palpated. No appreciable hepatosplenomegaly. Bowel sounds positive x4.  GU: Deferred. Musculoskeletal: No clubbing / cyanosis of digits/nails. No joint deformity upper and lower extremities.  Skin: No rashes, lesions, ulcers on a limited skin evaluation. No induration; Warm and dry.   Neurologic: CN 2-12 grossly intact with no focal deficits. Romberg sign and cerebellar reflexes not assessed.  Psychiatric: Normal judgment and insight. Alert and oriented x 3. Slightly anxious mood and appropriate affect.   Data Reviewed: I have personally reviewed following labs and imaging studies  CBC: Recent Labs  Lab 01/01/19 1717 01/02/19 0250 01/03/19 0226 01/04/19 0218 01/06/19 0217 01/07/19 0931  WBC 41.3* 42.2* 31.5* 27.6* 20.2* 22.7*  NEUTROABS 33.8* 37.5*  --   --   --  18.0*  HGB 13.9 12.7 11.3* 11.3* 11.9* 13.0  HCT 46.5* 42.1 38.6 38.6 39.1 44.3  MCV 77.8* 77.1* 77.0* 77.0* 76.7* 77.2*  PLT 236 202 184 181 190 224   Basic Metabolic Panel: Recent Labs  Lab 01/02/19 0250 01/03/19 0226 01/04/19 0218 01/06/19 0217 01/07/19 0931  NA 136 135 136 135 139  K 4.8 4.7 4.7 4.3 4.5  CL 101 102 101 98 98  CO2 33*  GLUCOSE 447* 407* 230* 274* 171*  BUN 16 20 25* 32* 22  CREATININE 1.06* 0.82 0.76 0.89 0.65  CALCIUM 8.7* 8.7* 8.7* 8.6* 8.8*  MG  --   --   --   --  2.6*  PHOS  --   --   --   --  3.5   GFR: Estimated Creatinine Clearance: 79.5 mL/min (by C-G formula based on SCr of 0.65 mg/dL). Liver Function Tests: Recent Labs  Lab 01/01/19 1717 01/07/19 0931  AST 14* 14*  ALT 19 27  ALKPHOS 116 76  BILITOT 0.7 0.6  PROT 6.3* 6.5  ALBUMIN 3.0* 3.1*   No results for input(s): LIPASE, AMYLASE in the last 168 hours. No results for input(s): AMMONIA in the last 168 hours. Coagulation Profile: No results for input(s): INR, PROTIME in the last 168 hours. Cardiac Enzymes: No results for input(s): CKTOTAL, CKMB, CKMBINDEX, TROPONINI in the last 168 hours. BNP (last 3 results) No results for input(s): PROBNP in the last 8760 hours. HbA1C: No results for input(s): HGBA1C in the last 72 hours. CBG: Recent Labs  Lab 01/06/19 1203 01/06/19 1631 01/06/19 2125 01/07/19 0717 01/07/19 1153  GLUCAP 131* 189* 293* 176* 172*   Lipid Profile: No  results for input(s): CHOL, HDL, LDLCALC, TRIG, CHOLHDL, LDLDIRECT in the last 72 hours. Thyroid Function Tests: No results for input(s): TSH, T4TOTAL, FREET4, T3FREE, THYROIDAB in the last 72 hours. Anemia Panel: No results for input(s): VITAMINB12, FOLATE, FERRITIN, TIBC, IRON, RETICCTPCT in the last 72 hours. Sepsis Labs: Recent Labs  Lab 01/01/19 1717 01/01/19 2032  LATICACIDVEN 2.1* 1.5    Recent Results (from the past 240 hour(s))  MRSA PCR Screening     Status: Abnormal   Collection Time: 01/02/19 10:27 AM  Result Value Ref Range Status   MRSA by PCR POSITIVE (A) NEGATIVE Final    Comment:        The GeneXpert MRSA Assay (FDA approved for NASAL specimens only), is one component of a comprehensive MRSA colonization surveillance program. It is not intended to diagnose MRSA infection nor to guide or monitor treatment for MRSA infections. RESULT CALLED TO, READ BACK BY AND VERIFIED WITH: Linward Foster RN 12:30 01/02/19 ([email or web address]) Performed at [facility], [address] [place], [place] [address]     Radiology Studies: Dg Chest Port 1 View  Result Date: 01/07/2019 CLINICAL DATA:  Shortness of breath EXAM: PORTABLE CHEST 1 VIEW COMPARISON:  CT chest dated 01/04/2019 FINDINGS: Lungs are essentially clear. Mild left basilar atelectasis. No focal consolidation. No pleural effusion or pneumothorax. The heart is mildly enlarged. Thoracic aortic atherosclerosis. Degenerative changes of the visualized thoracolumbar spine. IMPRESSION: No evidence of acute cardiopulmonary disease. Electronically Signed   By: Charline Bills M.D.   On: 01/07/2019 09:44   Scheduled Meds: . arformoterol  15 mcg Nebulization BID  . budesonide (PULMICORT) nebulizer solution  0.5 mg Nebulization BID  . Chlorhexidine Gluconate Cloth  6 each Topical Q0600  . doxycycline  200 mg Oral Daily  . enoxaparin (LOVENOX) injection  55 mg Subcutaneous Q24H  . furosemide  40 mg Intravenous Daily  . guaiFENesin   1,200 mg Oral BID  . insulin aspart  0-15 Units Subcutaneous TID WC  . insulin aspart  0-5 Units Subcutaneous QHS  . insulin aspart  10 Units Subcutaneous TID WC  . insulin glargine  40 Units Subcutaneous BID  . ipratropium-albuterol  3 mL Nebulization BID  . methylPREDNISolone (SOLU-MEDROL) injection  40 mg Intravenous Q8H  . mupirocin ointment  1 application Nasal BID  . sodium chloride flush  3 mL Intravenous Q12H  . sodium chloride flush  3 mL Intravenous Q12H  . sodium chloride HYPERTONIC  4 mL Nebulization Daily   Continuous Infusions: . sodium chloride 10 mL (01/02/19 0135)  . ceFEPime (MAXIPIME) IV 1 g (01/07/19 0916)  . vancomycin 1,500 mg (01/06/19 1541)    LOS: 6 days   Merlene Laughter, DO [facility] PAGER is on AMION  If 7PM-7AM, please contact night-coverage [email or web address] Password [facility] 01/07/2019, 12:23 PM

## 2019-01-07 NOTE — Progress Notes (Addendum)
Pharmacy Antibiotic Note  Kelli Rivera is a 68 y.o. female admitted on 01/01/2019 with pneumonia.   Cefepime / Vancomycin Day # 4 Afebrile WBC = 22.7 (chronic leukocytosis)  MRSA PCR +  Plan: Continue Vancomycin 1500 mg iv Q 24 hours Continue Cefepime 1 gram iv Q 12  Doxycycline 100 mg po BID Continue to follow - consider de-escalating antibiotics?  Height: 5\' 3"  (160 cm) Weight: 244 lb 14.9 oz (111.1 kg) IBW/kg (Calculated) : 52.4  Temp (24hrs), Avg:97.8 F (36.6 C), Min:97.5 F (36.4 C), Max:98.3 F (36.8 C)  Recent Labs  Lab 01/01/19 1717 01/01/19 2032 01/02/19 0250 01/03/19 0226 01/04/19 0218 01/06/19 0217 01/07/19 0931  WBC 41.3*  --  42.2* 31.5* 27.6* 20.2* 22.7*  CREATININE 1.11*  --  1.06* 0.82 0.76 0.89 0.65  LATICACIDVEN 2.1* 1.5  --   --   --   --   --     Estimated Creatinine Clearance: 79.5 mL/min (by C-G formula based on SCr of 0.65 mg/dL).    Allergies  Allergen Reactions  . Liraglutide Nausea And Vomiting  . Pioglitazone Swelling  . Amoxicillin-Pot Clavulanate Nausea And Vomiting    Did it involve swelling of the face/tongue/throat, SOB, or low BP? No Did it involve sudden or severe rash/hives, skin peeling, or any reaction on the inside of your mouth or nose? No Did you need to seek medical attention at a hospital or doctor's office? No When did it last happen?less than 10 years If all above answers are "NO", may proceed with cephalosporin use.     Antimicrobials this admission: Vancomycin 3/12>> Cefepime 3/12>>  Dose adjustments this admission:   Microbiology results: 3/10 MRSA PCR : positive   Thank you Okey Regal, PharmD [phone] 01/07/2019 11:39 AM

## 2019-01-08 LAB — CBC WITH DIFFERENTIAL/PLATELET
Abs Immature Granulocytes: 1.34 10*3/uL — ABNORMAL HIGH (ref 0.00–0.07)
BASOS ABS: 0.1 10*3/uL (ref 0.0–0.1)
Basophils Relative: 0 %
Eosinophils Absolute: 0.3 10*3/uL (ref 0.0–0.5)
Eosinophils Relative: 1 %
HCT: 41.5 % (ref 36.0–46.0)
Hemoglobin: 12 g/dL (ref 12.0–15.0)
Immature Granulocytes: 4 %
Lymphocytes Relative: 22 %
Lymphs Abs: 7.8 10*3/uL — ABNORMAL HIGH (ref 0.7–4.0)
MCH: 22.4 pg — ABNORMAL LOW (ref 26.0–34.0)
MCHC: 28.9 g/dL — ABNORMAL LOW (ref 30.0–36.0)
MCV: 77.6 fL — ABNORMAL LOW (ref 80.0–100.0)
Monocytes Absolute: 2.6 10*3/uL — ABNORMAL HIGH (ref 0.1–1.0)
Monocytes Relative: 8 %
NEUTROS PCT: 65 %
NRBC: 0 % (ref 0.0–0.2)
Neutro Abs: 22.6 10*3/uL — ABNORMAL HIGH (ref 1.7–7.7)
PLATELETS: 243 10*3/uL (ref 150–400)
RBC: 5.35 MIL/uL — AB (ref 3.87–5.11)
RDW: 17.9 % — AB (ref 11.5–15.5)
WBC Morphology: ABNORMAL
WBC: 34.8 10*3/uL — ABNORMAL HIGH (ref 4.0–10.5)

## 2019-01-08 LAB — COMPREHENSIVE METABOLIC PANEL
ALT: 29 U/L (ref 0–44)
AST: 17 U/L (ref 15–41)
Albumin: 2.8 g/dL — ABNORMAL LOW (ref 3.5–5.0)
Alkaline Phosphatase: 73 U/L (ref 38–126)
Anion gap: 7 (ref 5–15)
BUN: 32 mg/dL — ABNORMAL HIGH (ref 8–23)
CO2: 31 mmol/L (ref 22–32)
Calcium: 8.9 mg/dL (ref 8.9–10.3)
Chloride: 101 mmol/L (ref 98–111)
Creatinine, Ser: 0.69 mg/dL (ref 0.44–1.00)
GFR calc non Af Amer: >60 mL/min (ref >60)
Glucose, Bld: 53 mg/dL — ABNORMAL LOW (ref 70–99)
Potassium: 3.8 mmol/L (ref 3.5–5.1)
Sodium: 139 mmol/L (ref 135–145)
Total Bilirubin: 0.7 mg/dL (ref 0.3–1.2)
Total Protein: 6 g/dL — ABNORMAL LOW (ref 6.5–8.1)

## 2019-01-08 LAB — GLUCOSE, CAPILLARY
GLUCOSE-CAPILLARY: 236 mg/dL — AB (ref 70–99)
Glucose-Capillary: 76 mg/dL (ref 70–99)
Glucose-Capillary: 198 mg/dL — ABNORMAL HIGH (ref 70–99)
Glucose-Capillary: 359 mg/dL — ABNORMAL HIGH (ref 70–99)
Glucose-Capillary: 225 mg/dL — ABNORMAL HIGH (ref 70–99)

## 2019-01-08 LAB — PHOSPHORUS: Phosphorus: 4.1 mg/dL (ref 2.5–4.6)

## 2019-01-08 LAB — MAGNESIUM: Magnesium: 2.7 mg/dL — ABNORMAL HIGH (ref 1.7–2.4)

## 2019-01-08 LAB — PROCALCITONIN: Procalcitonin: <0.1 ng/mL

## 2019-01-08 MED ORDER — FUROSEMIDE 10 MG/ML IJ SOLN
20.0000 mg | Freq: Once | INTRAMUSCULAR | Status: AC
  Administered 2019-01-08: 20 mg via INTRAVENOUS
  Filled 2019-01-08: qty 2

## 2019-01-08 MED ORDER — METHYLPREDNISOLONE SODIUM SUCC 40 MG IJ SOLR: 40.0000 mg | INTRAMUSCULAR | Status: DC

## 2019-01-08 MED ORDER — INSULIN GLARGINE 100 UNIT/ML ~~LOC~~ SOLN
25.0000 [IU] | Freq: Two times a day (BID) | SUBCUTANEOUS | Status: DC
  Administered 2019-01-08 – 2019-01-09 (×2): 25 [IU] via SUBCUTANEOUS
  Filled 2019-01-08 (×4): qty 0.25

## 2019-01-08 MED ORDER — FUROSEMIDE 10 MG/ML IJ SOLN
40.0000 mg | Freq: Once | INTRAMUSCULAR | Status: AC
  Administered 2019-01-08: 40 mg via INTRAVENOUS
  Filled 2019-01-08: qty 4

## 2019-01-08 MED ORDER — INSULIN ASPART 100 UNIT/ML ~~LOC~~ SOLN
0.0000 [IU] | Freq: Three times a day (TID) | SUBCUTANEOUS | Status: DC
  Administered 2019-01-08: 3 [IU] via SUBCUTANEOUS
  Administered 2019-01-08: 15 [IU] via SUBCUTANEOUS
  Administered 2019-01-09: 8 [IU] via SUBCUTANEOUS
  Administered 2019-01-09: 5 [IU] via SUBCUTANEOUS
  Administered 2019-01-09: 3 [IU] via SUBCUTANEOUS
  Administered 2019-01-10: 8 [IU] via SUBCUTANEOUS
  Administered 2019-01-10: 5 [IU] via SUBCUTANEOUS

## 2019-01-08 MED ORDER — INSULIN ASPART 100 UNIT/ML ~~LOC~~ SOLN
0.0000 [IU] | Freq: Every day | SUBCUTANEOUS | Status: DC
  Administered 2019-01-08: 2 [IU] via SUBCUTANEOUS

## 2019-01-08 MED ORDER — METHYLPREDNISOLONE SODIUM SUCC 125 MG IJ SOLR
60.0000 mg | Freq: Two times a day (BID) | INTRAMUSCULAR | Status: DC
  Administered 2019-01-08 – 2019-01-10 (×5): 60 mg via INTRAVENOUS
  Filled 2019-01-08 (×5): qty 2

## 2019-01-08 NOTE — Progress Notes (Signed)
Inpatient Diabetes Program Recommendations  AACE/ADA: New Consensus Statement on Inpatient Glycemic Control (2015)  Target Ranges:  Prepandial:   less than 140 mg/dL      Peak postprandial:   less than 180 mg/dL (1-2 hours)      Critically ill patients:  140 - 180 mg/dL    Review of Glycemic Control  Diabetes history: DM 2 Outpatient Diabetes medications: Toujeo 30 units Daily, Metformin 1000 mg BID, Trulicity weekly Current orders for Inpatient glycemic control: Lantus 40 units BID, Novolog 0-15 units tid, Novolog 0-5 units qhs, Novolog 10 units tid meal coverage  Inpatient Diabetes Program Recommendations:    Noted steroids decreased to IV Solumedrol 40 mg Daily. Consider decreasing Lantus dose to 25 units BID starting this evening.  Thanks,  Christena Deem RN, MSN, BC-ADM Inpatient Diabetes Coordinator Team Pager [phone] (8a-5p)

## 2019-01-08 NOTE — Care Management Important Message (Signed)
Important Message  Patient Details  Name: Kelli Rivera MRN: [id number] Date of Birth: 1951-06-01   Medicare Important Message Given:  Yes    Takira Sherrin Stefan Church 01/08/2019, 3:54 PM

## 2019-01-08 NOTE — Progress Notes (Signed)
Physical Therapy Treatment Patient Details Name: Kelli Rivera MRN: [id number] DOB: Apr 01, 1951 Today's Date: 01/08/2019    History of Present Illness Pt is a 68 y/o female who presents s/p COPD exacerbation. PMH significant for morbid obesity, DM, COPD.     PT Comments    Pt progressing well and reports feeling 60% better. Pt desired to try ambulating without O2, pt dec to 81% on RA and reports "Now I know I need it, I feel it." Pt placed back on 2LO2 via [place]< SpO2 at 91%. Pt safe to d/c home once medically stable. Pt provided with HEP to help build strength and endurance. Acute PT to cont to follow.    Follow Up Recommendations  No PT follow up;Supervision - Intermittent     Equipment Recommendations  None recommended by PT    Recommendations for Other Services       Precautions / Restrictions Precautions Precautions: Fall Precaution Comments: Watch O2 stats Restrictions Weight Bearing Restrictions: No    Mobility  Bed Mobility               General bed mobility comments: pt sitting EOB upon PT arrival  Transfers Overall transfer level: Needs assistance Equipment used: Rolling walker (2 wheeled) Transfers: Sit to/from Stand Sit to Stand: Supervision         General transfer comment: no difficulty, pushed up from bed, no physical assist needed  Ambulation/Gait Ambulation/Gait assistance: Supervision Gait Distance (Feet): 150 Feet Assistive device: Rolling walker (2 wheeled) Gait Pattern/deviations: Step-through pattern Gait velocity: decreased   General Gait Details: pt with fluid gait pattern, mild SOB, SpO2 dec to 81% on RA during the last 1/4 of the walk. Pt upon sitting returned into 90s% within 30 sec. Pt states, "I now know that I need it." "I felt like I needed it during walking."   Stairs             Wheelchair Mobility    Modified Rankin (Stroke Patients Only)       Balance Overall balance assessment: Needs  assistance Sitting-balance support: Feet supported;No upper extremity supported Sitting balance-Leahy Scale: Normal     Standing balance support: No upper extremity supported;During functional activity Standing balance-Leahy Scale: Fair Standing balance comment: can maintain static standing but requires RW for ambulation                            Cognition Arousal/Alertness: Awake/alert Behavior During Therapy: WFL for tasks assessed/performed Overall Cognitive Status: Within Functional Limits for tasks assessed                                        Exercises      General Comments General comments (skin integrity, edema, etc.): pt with productive cough      Pertinent Vitals/Pain Pain Assessment: No/denies pain    Home Living                      Prior Function            PT Goals (current goals can now be found in the care plan section) Acute Rehab PT Goals Patient Stated Goal: home today Progress towards PT goals: Progressing toward goals    Frequency    Min 3X/week      PT Plan Current plan remains appropriate    Co-evaluation  AM-PAC PT "6 Clicks" Mobility   Outcome Measure  Help needed turning from your back to your side while in a flat bed without using bedrails?: None Help needed moving from lying on your back to sitting on the side of a flat bed without using bedrails?: None Help needed moving to and from a bed to a chair (including a wheelchair)?: None Help needed standing up from a chair using your arms (e.g., wheelchair or bedside chair)?: None Help needed to walk in hospital room?: None Help needed climbing 3-5 steps with a railing? : A Little 6 Click Score: 23    End of Session Equipment Utilized During Treatment: Gait belt;Oxygen(2LO2 via [place]) Activity Tolerance: Patient tolerated treatment well Patient left: with call bell/phone within reach;with nursing/sitter in room(sittin  EOB) Nurse Communication: Mobility status PT Visit Diagnosis: Unsteadiness on feet (R26.81);Muscle weakness (generalized) (M62.81)     Time: 2878-6767 PT Time Calculation (min) (ACUTE ONLY): 17 min  Charges:  $Gait Training: 8-22 mins                     Lewis Shock, PT, DPT Acute Rehabilitation Services Pager #: [phone] Office #: [phone]    Iona Hansen 01/08/2019, 9:34 AM

## 2019-01-08 NOTE — Progress Notes (Signed)
PROGRESS NOTE    Kelli Rivera  JWJ:[id number] DOB: 10/18/1951 DOA: 01/01/2019 PCP: Angelica ChessmanAguiar, Rafaela M, MD   Brief Narrative:  Kelli Rivera a 68 y.o.femalewith medical history significant forCOPD, nocturnal supplemental oxygen requirement, insulin-dependent diabetes mellitus, chronic leukocytosis, rheumatoid arthritis, and other comorbidities who presented to the emergency department for worsening of shortness of breath, increased productive cough, and wheezing. The patient reports that symptoms developed proximately 2 weeks ago, she was started on prednisone taper and an unknown antibiotic, had no significant improvement, and was started on a second course of steroids and antibiotic. She reports that she may have improved some for a day or so with these outpatient treatments, but has begun to worsen again.    Work-up in the emergency department patient was found to have oxygen saturations of 90% on 2 L of oxygen, tachypneic, tachycardic with heart rate in 120s.  EKG showed sinus tachycardia. Chest x-ray was negative for any acute cardiopulmonary disease.  Patient had initial lactic acid of 2.1. Patient continued to have significant cough.  Patient was initially treated with Rocephin, doxycycline, patient continued to have significant productive sputum.  However patient could not provide sputum cultures.    Considering patient's course and significant cough, her Abx were broadened to with vancomycin, cefepime as patient has MRSA positive on 3/12.  Patient was also continued with duo nebs, Solu-Medrol, budesonide nebs also Lasix.  Patient is showing slow but gradual improvement.  Patient still has significant cough with any exertion including speaking.  Antibiotics were de-ecalated to just Doxycycline yesterday and they were stopped today. She is breathing better today and not as rhonchrous and she is 60%. Weaned to her Home O2. Respiratory Virus Panel was negative and Procalcitonin Level was  (<0.10 x2).  Assessment & Plan:   Principal Problem:   COPD with acute exacerbation (HCC) Active Problems:   Leukocytosis   Diabetes mellitus type 2 in obese (HCC)   RA (rheumatoid arthritis) (HCC)   OSA (obstructive sleep apnea)   Chronic diastolic CHF (congestive heart failure) (HCC)   Mild renal insufficiency  COPD with Acute Exacerbation, improving  -Presented with increased SOB, productive cough, and wheezing -Failed outpatient treatment with 2 courses of steroids and antibiotics -Checked Respiratory Virus Panel and was Negative; Empiric Droplet Precautions now D/C'd -C/w Albuterol Neb 2.5 mg Neb q4hprn Wheezing and SOB -C/w Brovana 15 mcg Neb BID, Budesonide 0.5 mg Neb BID, and DuoNeb BID -Added back Fluticasone 2 spray Each Nare Daily along with Montelukast 10 mg po every morning -Stopped current dose of Guaifenesin and changed to 1200 mg po BID -Was on Methylprednisolone 40 mg q8h and weaned to IV 40 mg q12h but was still wheezing significantly so increased to IV 60 mg q12h this AM  -Added the patient on Hypertonic Saline Nebs and Chest PT -Added Flutter Valve and Incentive Spirometry  -Was changed to  IV Vancomycin and IV Cefepime on 01/04/2019 covering for MRSA, Pseudomonas given concern for HCAP but will De-escalated and now Abx are discontinued.  -CT of the Chest showed No acute findings in the chest. Chronic Emphysema. Chronic central pulmonary artery enlargement suggesting a degree of pulmonary artery hypertension. -Repeat CXR yesterday AM showed Lungs are essentially clear. Mild left basilar atelectasis. No focal consolidation. No pleural effusion or pneumothorax. The heart is mildly enlarged. Thoracic aortic atherosclerosis. Degenerative changes of the visualized thoracolumbar spine. -Slow but gradual improvement -Continue Supplemental O2 via [place] and Wean as tolerated -Continuous Pulse Oximetry and Maintain O2 Saturations >  90% -Patient has improved and does not have  significant bilateral wheezing with prolonged expiratory phase today  -Will attempt to Wean O2 to Home 2 Liters; Home O2 Screen done and she did desaturate  -PT recommending Home Health PT at D/C  Chronic Diastolic CHF -Strict I's/O's, Daily Weights and will now Fluid Restrict to 1500 mL -Patient is down 8 lbs since admission and she is -7.836 Liters since -Given 60 mg IV Lasix this AM and will give 40 mg IV Lasix this evening -Continue to Monitor for S/Sx of Volume Overload -Still appears a little Volume overloaded.   Mild Renal Insufficiency, improved -SCr is 1.11 on admission, up from 0.92 in September -She was given 500 cc NS in ED -Renally-dose medications and avoid Nephrotoxic Medications if possible and Avoid Contrast Dyes and Hypotension -BUN/Cr is now stable at 32/0.69 -Continue to Monitor and Trend Renal Function -Repeat CMP in AM   Leukocytosis -Has Chronic Leukocytosis but likely to get worse in the setting of IV Steroid Demargination -WBC was 41,300 on admission and then went to 42.2 ->31.5->27.6->20.2 -> 22.7 -> 34.8; up from 23,500 in September -She has seen Hematology previously for chronic leukocytosis -Patient was currently on Methylprednisolone 40 mg q8h for COPD exacerbation and will wean to q12h  -Currently on antibiotic for severe COPD exacerbation and will C/w Empiric Abx as above; Will obtain Blood Cx if she becomes Febrile -PCT was <0.10 x2  -MRSA PCR was Positive -Continue to Monitor for S/Sx of Infection -Repeat CBC in AM   Insulin Dependent Diabetes Mellitus Type 2 with Hyperglycemia -Currently Holding Home Metformin 1000 mg po Daily with Breakfast, Trulicity 1.5 mg sq qWeekly, and Toujeo 30 units sq qMorning  -HbA1c was 7.6 -CBG's ranging from 76-236 -Changed Lantus 40 units BID to 25 units BID -C/w Moderate Novolog SSI AC/HS -Added Novolog 10 units TIDwm yesterday  -Continue to Adjust Insulin Regimen as Necessary  Rheumatoid Arthritis  -Currently holding Etanercept 50 mg Subdermal Injections  Microcytic Anemia/Hx of Iron Deficiency  -Patient's Hb/Hct went from 11.9/39.1 -> 13.0/44.3 -> 12.0/41.5 -Check Anemia Panel in AM -Continue to Monitor for S/Sx of Bleeding -Repeat CBC in AM   Morbid Obesity -Estimated body mass index is 43 kg/m as calculated from the following:   Height as of this encounter: 5\' 3"  (1.6 m).   Weight as of this encounter: 110.1 kg. -Weight Loss and Dietary Counseling given  -Encouraged patient to sit in the chair, walk tolerated  GERD -C/w Pantoprazole 40 mg po Daily   HLD -Not on any Statin at Home  Elevated BUN -In the setting of Steroid Demargination -Patient's BUN is 32 and dissociated  -Continue to Monitor closely   DVT prophylaxis: Enoxaparin 55 mg sq q24h Code Status: FULL CODE  Family Communication: No family present at bedside  Disposition Plan: Anticipate D/C Home in the next 24-48 Hours if medically stable and Respiratory status back to baseline but still diuresing and still on IV Steroids   Consultants:   None   Procedures: None  Antimicrobials:  Anti-infectives (From admission, onward)   Start     Dose/Rate Route Frequency Ordered Stop   01/05/19 1600  vancomycin (VANCOCIN) 1,500 mg in sodium chloride 0.9 % 500 mL IVPB  Status:  Discontinued     1,500 mg 250 mL/hr over 120 Minutes Intravenous Every 24 hours 01/04/19 1521 01/07/19 1252   01/04/19 1500  ceFEPIme (MAXIPIME) 1 g in sodium chloride 0.9 % 100 mL IVPB  Status:  Discontinued  1 g 200 mL/hr over 30 Minutes Intravenous Every 12 hours 01/04/19 1351 01/07/19 1252   01/04/19 1500  vancomycin (VANCOCIN) 2,000 mg in sodium chloride 0.9 % 500 mL IVPB  Status:  Discontinued     2,000 mg 250 mL/hr over 120 Minutes Intravenous  Once 01/04/19 1359 01/04/19 1400   01/04/19 1500  vancomycin (VANCOCIN) 2,500 mg in sodium chloride 0.9 % 500 mL IVPB     2,500 mg 250 mL/hr over 120 Minutes Intravenous  Once 01/04/19  1400 01/04/19 1930   01/03/19 0930  cefTRIAXone (ROCEPHIN) 1 g in sodium chloride 0.9 % 100 mL IVPB  Status:  Discontinued     1 g 200 mL/hr over 30 Minutes Intravenous Every 24 hours 01/03/19 0839 01/04/19 1351   01/02/19 1000  doxycycline (VIBRA-TABS) tablet 200 mg     200 mg Oral Daily 01/01/19 2251 01/08/19 1610     Subjective: Seen And examined at bedside states that she is doing 60% better since coming in but still not back to baseline.  Denies any chest pain, nausea, vomiting and ambulated with physical therapy and attempted to be weaned off oxygen but was unsuccessful.  Still having shortness of breath and a cough which is more productive.  Today she is wheezing worse than she was yesterday.  No other concerns or complaints at this time.  Objective: Vitals:   01/07/19 2035 01/08/19 0330 01/08/19 0737 01/08/19 0826  BP:   124/61   Pulse:   74 71  Resp:    18  Temp:   98.3 F (36.8 C)   TempSrc:   Oral   SpO2: 98%  98% 97%  Weight:  110.1 kg    Height:        Intake/Output Summary (Last 24 hours) at 01/08/2019 1456 Last data filed at 01/08/2019 1200 Gross per 24 hour  Intake 480 ml  Output -  Net 480 ml   Filed Weights   01/06/19 0600 01/07/19 0500 01/08/19 0330  Weight: 113 kg 111.1 kg 110.1 kg   Examination: Physical Exam:  Constitutional: Well-nourished, well-developed morbidly obese African-American female currently no acute distress appears calm but a little winded after walking with PT.  Eyes: Lids and conjunctive are normal.  Sclera anicteric ENMT: External ears and nose appear normal.  Grossly normal hearing.  Mucous members are moist Neck: Appears supple appreciable JVD but is difficult to assess due to her body habitus Respiratory: Diminished to auscultation bilaterally with coarse breath sounds and expiratory wheezing at the bases and mid lobes in the posterior lung fields.  Has a normal respiratory effort and she is not tachypneic but she is wearing  supplemental oxygen via nasal cannula Cardiovascular: Regular rate and rhythm.  No appreciable murmurs, rubs, gallops.  Has 1+ lower extremity edema Abdomen: Soft, nontender, distended secondary body habitus.  Bowel sounds present in all 4 quadrants GU: Deferred Musculoskeletal: No contractures or cyanosis.  No joint deformities in upper and lower extremities. Skin: No appreciable rashes or lesions on limited skin evaluation.  Skin is warm and dry. Neurologic: Cranial nerves II through XII grossly intact no appreciable focal deficits.  Romberg sign and cerebellar reflexes were not assessed Psychiatric: Normal judgment and insight.  Patient is awake and alert and oriented.  Not as anxious today.  Has an appropriate affect.  Data Reviewed: I have personally reviewed following labs and imaging studies  CBC: Recent Labs  Lab 01/01/19 1717 01/02/19 0250 01/03/19 0226 01/04/19 0218 01/06/19 0217 01/07/19 0931 01/08/19  0204  WBC 41.3* 42.2* 31.5* 27.6* 20.2* 22.7* 34.8*  NEUTROABS 33.8* 37.5*  --   --   --  18.0* 22.6*  HGB 13.9 12.7 11.3* 11.3* 11.9* 13.0 12.0  HCT 46.5* 42.1 38.6 38.6 39.1 44.3 41.5  MCV 77.8* 77.1* 77.0* 77.0* 76.7* 77.2* 77.6*  PLT 236 202 184 181 190 224 243   Basic Metabolic Panel: Recent Labs  Lab 01/03/19 0226 01/04/19 0218 01/06/19 0217 01/07/19 0931 01/08/19 0204  NA 135 136 135 139 139  K 4.7 4.7 4.3 4.5 3.8  CL 102 101 98 98 101  CO2 26 29 30  33* 31  GLUCOSE 407* 230* 274* 171* 53*  BUN 20 25* 32* 22 32*  CREATININE 0.82 0.76 0.89 0.65 0.69  CALCIUM 8.7* 8.7* 8.6* 8.8* 8.9  MG  --   --   --  2.6* 2.7*  PHOS  --   --   --  3.5 4.1   GFR: Estimated Creatinine Clearance: 79.1 mL/min (by C-G formula based on SCr of 0.69 mg/dL). Liver Function Tests: Recent Labs  Lab 01/01/19 1717 01/07/19 0931 01/08/19 0204  AST 14* 14* 17  ALT 19 27 29   ALKPHOS 116 76 73  BILITOT 0.7 0.6 0.7  PROT 6.3* 6.5 6.0*  ALBUMIN 3.0* 3.1* 2.8*   No results for  input(s): LIPASE, AMYLASE in the last 168 hours. No results for input(s): AMMONIA in the last 168 hours. Coagulation Profile: No results for input(s): INR, PROTIME in the last 168 hours. Cardiac Enzymes: No results for input(s): CKTOTAL, CKMB, CKMBINDEX, TROPONINI in the last 168 hours. BNP (last 3 results) No results for input(s): PROBNP in the last 8760 hours. HbA1C: No results for input(s): HGBA1C in the last 72 hours. CBG: Recent Labs  Lab 01/07/19 1618 01/07/19 2104 01/08/19 0544 01/08/19 0735 01/08/19 1138  GLUCAP 162* 204* 76 236* 198*   Lipid Profile: No results for input(s): CHOL, HDL, LDLCALC, TRIG, CHOLHDL, LDLDIRECT in the last 72 hours. Thyroid Function Tests: No results for input(s): TSH, T4TOTAL, FREET4, T3FREE, THYROIDAB in the last 72 hours. Anemia Panel: No results for input(s): VITAMINB12, FOLATE, FERRITIN, TIBC, IRON, RETICCTPCT in the last 72 hours. Sepsis Labs: Recent Labs  Lab 01/01/19 1717 01/01/19 2032 01/07/19 1312 01/08/19 0204  PROCALCITON  --   --  <0.10 <0.10  LATICACIDVEN 2.1* 1.5  --   --     Recent Results (from the past 240 hour(s))  MRSA PCR Screening     Status: Abnormal   Collection Time: 01/02/19 10:27 AM  Result Value Ref Range Status   MRSA by PCR POSITIVE (A) NEGATIVE Final    Comment:        The GeneXpert MRSA Assay (FDA approved for NASAL specimens only), is one component of a comprehensive MRSA colonization surveillance program. It is not intended to diagnose MRSA infection nor to guide or monitor treatment for MRSA infections. RESULT CALLED TO, READ BACK BY AND VERIFIED WITH: Linward Foster RN 12:30 01/02/19 ([email or web address]) Performed at [facility], [address] [place], [place] [address]   Respiratory Panel by PCR     Status: None   Collection Time: 01/07/19  7:58 AM  Result Value Ref Range Status   Adenovirus NOT DETECTED NOT DETECTED Final   Coronavirus 229E NOT DETECTED NOT DETECTED Final    Comment: (NOTE)  The Coronavirus on the Respiratory Panel, DOES NOT test for the novel  Coronavirus (2019 nCoV)    Coronavirus HKU1 NOT DETECTED NOT DETECTED  Final   Coronavirus NL63 NOT DETECTED NOT DETECTED Final   Coronavirus OC43 NOT DETECTED NOT DETECTED Final   Metapneumovirus NOT DETECTED NOT DETECTED Final   Rhinovirus / Enterovirus NOT DETECTED NOT DETECTED Final   Influenza A NOT DETECTED NOT DETECTED Final   Influenza B NOT DETECTED NOT DETECTED Final   Parainfluenza Virus 1 NOT DETECTED NOT DETECTED Final   Parainfluenza Virus 2 NOT DETECTED NOT DETECTED Final   Parainfluenza Virus 3 NOT DETECTED NOT DETECTED Final   Parainfluenza Virus 4 NOT DETECTED NOT DETECTED Final   Respiratory Syncytial Virus NOT DETECTED NOT DETECTED Final   Bordetella pertussis NOT DETECTED NOT DETECTED Final   Chlamydophila pneumoniae NOT DETECTED NOT DETECTED Final   Mycoplasma pneumoniae NOT DETECTED NOT DETECTED Final    Comment: Performed at [facility], [address], [place], [place] [address]    Radiology Studies: Dg Chest Port 1 View  Result Date: 01/07/2019 CLINICAL DATA:  Shortness of breath EXAM: PORTABLE CHEST 1 VIEW COMPARISON:  CT chest dated 01/04/2019 FINDINGS: Lungs are essentially clear. Mild left basilar atelectasis. No focal consolidation. No pleural effusion or pneumothorax. The heart is mildly enlarged. Thoracic aortic atherosclerosis. Degenerative changes of the visualized thoracolumbar spine. IMPRESSION: No evidence of acute cardiopulmonary disease. Electronically Signed   By: Charline Bills M.D.   On: 01/07/2019 09:44   Scheduled Meds: . arformoterol  15 mcg Nebulization BID  . aspirin EC  81 mg Oral Daily  . budesonide (PULMICORT) nebulizer solution  0.5 mg Nebulization BID  . Chlorhexidine Gluconate Cloth  6 each Topical Q0600  . enoxaparin (LOVENOX) injection  55 mg Subcutaneous Q24H  . furosemide  40 mg Intravenous Daily  . furosemide  40 mg Intravenous Once  .  guaiFENesin  1,200 mg Oral BID  . insulin aspart  0-15 Units Subcutaneous TID WC  . insulin aspart  0-5 Units Subcutaneous QHS  . insulin aspart  10 Units Subcutaneous TID WC  . insulin glargine  25 Units Subcutaneous BID  . ipratropium-albuterol  3 mL Nebulization BID  . methylPREDNISolone (SOLU-MEDROL) injection  60 mg Intravenous Q12H  . montelukast  10 mg Oral [id number]  . multivitamin with minerals  1 tablet Oral QAC lunch  . mupirocin ointment  1 application Nasal BID  . oxybutynin  15 mg Oral Daily  . pantoprazole  40 mg Oral Daily  . sodium chloride flush  3 mL Intravenous Q12H  . sodium chloride flush  3 mL Intravenous Q12H  . sodium chloride HYPERTONIC  4 mL Nebulization Daily   Continuous Infusions: . sodium chloride 10 mL (01/02/19 0135)    LOS: 7 days   Merlene Laughter, DO [facility] PAGER is on AMION  If 7PM-7AM, please contact night-coverage [email or web address] Password [facility] 01/08/2019, 2:56 PM

## 2019-01-09 ENCOUNTER — Inpatient Hospital Stay (HOSPITAL_COMMUNITY): Payer: Medicare Other

## 2019-01-09 LAB — COMPREHENSIVE METABOLIC PANEL
ALK PHOS: 71 U/L (ref 38–126)
ALT: 33 U/L (ref 0–44)
AST: 21 U/L (ref 15–41)
Albumin: 2.8 g/dL — ABNORMAL LOW (ref 3.5–5.0)
Anion gap: 11 (ref 5–15)
BUN: 35 mg/dL — ABNORMAL HIGH (ref 8–23)
CALCIUM: 8.6 mg/dL — AB (ref 8.9–10.3)
CO2: 28 mmol/L (ref 22–32)
Chloride: 96 mmol/L — ABNORMAL LOW (ref 98–111)
Creatinine, Ser: 0.92 mg/dL (ref 0.44–1.00)
GFR calc Af Amer: >60 mL/min (ref >60)
GFR calc non Af Amer: >60 mL/min (ref >60)
Glucose, Bld: 152 mg/dL — ABNORMAL HIGH (ref 70–99)
Potassium: 4.2 mmol/L (ref 3.5–5.1)
Sodium: 135 mmol/L (ref 135–145)
Total Bilirubin: 0.6 mg/dL (ref 0.3–1.2)
Total Protein: 6.1 g/dL — ABNORMAL LOW (ref 6.5–8.1)

## 2019-01-09 LAB — PROCALCITONIN: Procalcitonin: <0.1 ng/mL

## 2019-01-09 LAB — IRON AND TIBC
Iron: 42 ug/dL (ref 28–170)
Saturation Ratios: 13 % (ref 10.4–31.8)
TIBC: 335 ug/dL (ref 250–450)
UIBC: 293 ug/dL

## 2019-01-09 LAB — CBC WITH DIFFERENTIAL/PLATELET
Band Neutrophils: 0 %
Basophils Absolute: 0 10*3/uL (ref 0.0–0.1)
Basophils Relative: 0 %
Blasts: 0 %
Eosinophils Absolute: 0 10*3/uL (ref 0.0–0.5)
Eosinophils Relative: 0 %
HCT: 38.3 % (ref 36.0–46.0)
Hemoglobin: 11.8 g/dL — ABNORMAL LOW (ref 12.0–15.0)
Lymphocytes Relative: 9 %
Lymphs Abs: 2.4 10*3/uL (ref 0.7–4.0)
MCH: 23.6 pg — ABNORMAL LOW (ref 26.0–34.0)
MCHC: 30.8 g/dL (ref 30.0–36.0)
MCV: 76.6 fL — AB (ref 80.0–100.0)
Metamyelocytes Relative: 0 %
Monocytes Absolute: 1.3 10*3/uL — ABNORMAL HIGH (ref 0.1–1.0)
Monocytes Relative: 5 %
Myelocytes: 0 %
Neutro Abs: 23.2 10*3/uL — ABNORMAL HIGH (ref 1.7–7.7)
Neutrophils Relative %: 86 %
Other: 0 %
Platelets: 225 10*3/uL (ref 150–400)
Promyelocytes Relative: 0 %
RBC: 5 MIL/uL (ref 3.87–5.11)
RDW: 17.6 % — ABNORMAL HIGH (ref 11.5–15.5)
WBC: 26.9 10*3/uL — AB (ref 4.0–10.5)
nRBC: 0 % (ref 0.0–0.2)
nRBC: 0 /100 WBC

## 2019-01-09 LAB — GLUCOSE, CAPILLARY
GLUCOSE-CAPILLARY: 169 mg/dL — AB (ref 70–99)
Glucose-Capillary: 180 mg/dL — ABNORMAL HIGH (ref 70–99)
Glucose-Capillary: 222 mg/dL — ABNORMAL HIGH (ref 70–99)
Glucose-Capillary: 299 mg/dL — ABNORMAL HIGH (ref 70–99)
Glucose-Capillary: 71 mg/dL (ref 70–99)

## 2019-01-09 LAB — FOLATE: FOLATE: 11.7 ng/mL (ref >5.9)

## 2019-01-09 LAB — MAGNESIUM: Magnesium: 2.4 mg/dL (ref 1.7–2.4)

## 2019-01-09 LAB — RETICULOCYTES
Immature Retic Fract: 9 % (ref 2.3–15.9)
RBC.: 5 MIL/uL (ref 3.87–5.11)
RETIC COUNT ABSOLUTE: 55 10*3/uL (ref 19.0–186.0)
Retic Ct Pct: 1.1 % (ref 0.4–3.1)

## 2019-01-09 LAB — FERRITIN: Ferritin: 54 ng/mL (ref 11–307)

## 2019-01-09 LAB — PATHOLOGIST SMEAR REVIEW

## 2019-01-09 LAB — VITAMIN B12: Vitamin B-12: 786 pg/mL (ref 180–914)

## 2019-01-09 MED ORDER — INSULIN GLARGINE 100 UNIT/ML ~~LOC~~ SOLN
28.0000 [IU] | Freq: Two times a day (BID) | SUBCUTANEOUS | Status: DC
  Administered 2019-01-10: 28 [IU] via SUBCUTANEOUS
  Filled 2019-01-09 (×3): qty 0.28

## 2019-01-09 MED ORDER — INSULIN ASPART 100 UNIT/ML ~~LOC~~ SOLN
12.0000 [IU] | Freq: Three times a day (TID) | SUBCUTANEOUS | Status: DC
  Administered 2019-01-09 – 2019-01-10 (×3): 12 [IU] via SUBCUTANEOUS

## 2019-01-09 MED ORDER — FUROSEMIDE 10 MG/ML IJ SOLN
60.0000 mg | Freq: Two times a day (BID) | INTRAMUSCULAR | Status: DC
  Administered 2019-01-09 – 2019-01-10 (×3): 60 mg via INTRAVENOUS
  Filled 2019-01-09 (×3): qty 6

## 2019-01-09 NOTE — Progress Notes (Signed)
SATURATION QUALIFICATIONS: (This note is used to comply with regulatory documentation for home oxygen)  Patient Saturations on Room Air at Rest = 95%  Patient Saturations on Room Air while Ambulating = 91%  Patient Saturations on  Liters of oxygen while Ambulating = % Did not use O2 as pt's sats remained in 90's on RA  Please briefly explain why patient needs home oxygen: Did not drop below 90% with ambulation this visit.   Lyanne Co, PT  Acute Rehab Services  Pager [phone] Office [phone]

## 2019-01-09 NOTE — Progress Notes (Signed)
Inpatient Diabetes Program Recommendations  AACE/ADA: New Consensus Statement on Inpatient Glycemic Control (2015)  Target Ranges:  Prepandial:   less than 140 mg/dL      Peak postprandial:   less than 180 mg/dL (1-2 hours)      Critically ill patients:  140 - 180 mg/dL   Results for Kelli Rivera, Kelli Rivera (MRN [id number]) as of 01/09/2019 11:37  Ref. Range 01/08/2019 05:44 01/08/2019 07:35 01/08/2019 11:38 01/08/2019 16:45 01/08/2019 20:44  Glucose-Capillary Latest Ref Range: 70 - 99 mg/dL 76 212 (H)  10 units NOVOLOG given at 9:30am +  40 units LANTUS given at 9:30am 198 (H)  13 units NOVOLOG  359 (H)  15 units NOVOLOG  225 (H)  2 units NOVOLOG +    25 units LANTUS given at 10pm    Results for Kelli Rivera, Kelli Rivera (MRN [id number]) as of 01/09/2019 11:37  Ref. Range 01/09/2019 03:05 01/09/2019 08:16 01/09/2019 11:23  Glucose-Capillary Latest Ref Range: 70 - 99 mg/dL 048 (H) 889 (H)  13 units NOVOLOG given at 10am +  25 units LANTUS given at 10am 222 (H)    Home DM Meds: Toujeo 30 units Daily       Metformin 1000 mg Daily       Trulicity 1.5 mg Qweek   Current Orders: Lantus 25 units BID       Novolog Moderate Correction Scale/ SSI (0-15 units) TID AC + HS      Novolog 10 units TID with meals    Lantus was reduced yest b/c steroids were reduced, however, Solumedrol increased back to 60 mg BID yesterday.  Patient received a total of 65 units Lantus yesterday--Will get total of 50 units Lantus today.  CBGs a bit elevated today.    MD- If patient to remain on present dose of Solumedrol, please consider the following:  1. Increase Lantus slightly to 28 units BID  2. Increase Novolog Meal Coverage to: Novolog 12 units TID with meals    --Will follow patient during hospitalization--  Ambrose Finland RN, MSN, CDE Diabetes Coordinator Inpatient Glycemic Control Team Team Pager: [phone] (8a-5p)

## 2019-01-09 NOTE — Progress Notes (Signed)
PROGRESS NOTE    Kelli Rivera  WGN:[id number] DOB: 01/06/1951 DOA: 01/01/2019 PCP: Angelica ChessmanAguiar, Rafaela M, MD   Brief Narrative:  Kelli MuseLucy P Robertsis a 68 y.o.femalewith medical history significant forCOPD, nocturnal supplemental oxygen requirement, insulin-dependent diabetes mellitus, chronic leukocytosis, rheumatoid arthritis, and other comorbidities who presented to the emergency department for worsening of shortness of breath, increased productive cough, and wheezing. The patient reports that symptoms developed proximately 2 weeks ago, she was started on prednisone taper and an unknown antibiotic, had no significant improvement, and was started on a second course of steroids and antibiotic. She reports that she may have improved some for a day or so with these outpatient treatments, but has begun to worsen again.    Work-up in the emergency department patient was found to have oxygen saturations of 90% on 2 L of oxygen, tachypneic, tachycardic with heart rate in 120s.  EKG showed sinus tachycardia. Chest x-ray was negative for any acute cardiopulmonary disease.  Patient had initial lactic acid of 2.1. Patient continued to have significant cough.  Patient was initially treated with Rocephin, doxycycline, patient continued to have significant productive sputum.  However patient could not provide sputum cultures.    Considering patient's course and significant cough, her Abx were broadened to with vancomycin, cefepime as patient has MRSA positive on 3/12.  Patient was also continued with duo nebs, Solu-Medrol, budesonide nebs also Lasix.  Patient is showing slow but gradual improvement.  Patient still has significant cough with any exertion including speaking.  Antibiotics were de-ecalated to just Doxycycline the day before yesterday and they were stopped yesterday. She is breathing better today and not as rhonchrous but still has signficant wheezing. Weaned to her Home O2. Respiratory Virus Panel was  negative and Procalcitonin Level was (<0.10 x2). Diuresis increased today as she still appears slightly volume overloaded.   Assessment & Plan:   Principal Problem:   COPD with acute exacerbation (HCC) Active Problems:   Leukocytosis   Diabetes mellitus type 2 in obese (HCC)   RA (rheumatoid arthritis) (HCC)   OSA (obstructive sleep apnea)   Chronic diastolic CHF (congestive heart failure) (HCC)   Mild renal insufficiency  COPD with Acute Exacerbation, improving  -Presented with increased SOB, productive cough, and wheezing -Failed outpatient treatment with 2 courses of steroids and antibiotics -Checked Respiratory Virus Panel and was Negative; Empiric Droplet Precautions now D/C'd -C/w Albuterol Neb 2.5 mg Neb q4hprn Wheezing and SOB -C/w Brovana 15 mcg Neb BID, Budesonide 0.5 mg Neb BID, and DuoNeb BID -Added back Fluticasone 2 spray Each Nare Daily along with Montelukast 10 mg po every morning -Stopped current dose of Guaifenesin and changed to 1200 mg po BID -Was on Methylprednisolone 40 mg q8h and weaned to IV 40 mg q12h but was still wheezing significantly so increased to IV 60 mg q12h yesteday AM and will continue today  -Added the patient on Hypertonic Saline Nebs (D/C'd today) and Chest PT -Added Flutter Valve and Incentive Spirometry  -Was changed to  IV Vancomycin and IV Cefepime on 01/04/2019 covering for MRSA, Pseudomonas given concern for HCAP but will De-escalated and now Abx are discontinued.  -CT of the Chest showed No acute findings in the chest. Chronic Emphysema. Chronic central pulmonary artery enlargement suggesting a degree of pulmonary artery hypertension. -Repeat CXR this AM showed "Cardiomediastinal silhouette unchanged in size and contour. Calcifications of the aortic arch. No pneumothorax or pleural effusion. Similar appearance of coarsened interstitial markings without evidence of central vascular congestion or  interlobular septal thickening. Linear opacity  at the left lung base. No confluent airspace disease. No displaced fracture-Slow but gradual improvement." -Continue Supplemental O2 via [place] and Wean as tolerated -Continuous Pulse Oximetry and Maintain O2 Saturations >90% -Patient has improved and does not have significant bilateral wheezing with prolonged expiratory phase today  -Will attempt to Wean O2 to Home 2 Liters; Home O2 Screen done and she did desaturate yesterday but tody she improved and did not desaturate below 90% on Room Air -PT was recommending Home Health PT at D/C but now recommending no follow up  Chronic Diastolic CHF -Strict I's/O's, Daily Weights and will now Fluid Restrict to 1500 mL -Patient is down 8 lbs since admission and she is -7.360 Liters since admission -Diuresis changed to IV 60 mg BID given continued Volume Overload -Continue to Monitor for S/Sx of Volume Overload -Will get TED Hose -Still appears a little Volume overloaded again today .   Mild Renal Insufficiency, improved -SCr is 1.11 on admission, up from 0.92 in September -She was given 500 cc NS in ED -Renally-dose medications and avoid Nephrotoxic Medications if possible and Avoid Contrast Dyes and Hypotension -BUN/Cr is now stable at 35/0.92 -Continue to Monitor and Trend Renal Function -Repeat CMP in AM   Leukocytosis -Has Chronic Leukocytosis but likely to get worse in the setting of IV Steroid Demargination -WBC was 41,300 on admission and then went to 42.2 ->31.5->27.6->20.2 -> 22.7 -> 34.8 -> 26.9; up from 23,500 in September -She has seen Hematology previously for chronic leukocytosis -Solumedrol as Above -Currently on antibiotic for severe COPD exacerbation and will C/w Empiric Abx as above; Will obtain Blood Cx if she becomes Febrile -PCT was <0.10 x2  -MRSA PCR was Positive -Continue to Monitor for S/Sx of Infection -Repeat CBC in AM   Insulin Dependent Diabetes Mellitus Type 2 with Hyperglycemia -Currently Holding Home  Metformin 1000 mg po Daily with Breakfast, Trulicity 1.5 mg sq qWeekly, and Toujeo 30 units sq qMorning  -HbA1c was 7.6 -CBG's ranging from 169-359 -Changed Lantus 40 units BID to 25 units BID yesterday and will increase to 28 units today  -C/w Moderate Novolog SSI AC/HS -Added Novolog 10 units TIDwm and increased to 12 units  -Continue to Adjust Insulin Regimen as Necessary  Rheumatoid Arthritis -Currently holding Etanercept 50 mg Subdermal Injections  Microcytic Anemia/Hx of Iron Deficiency  -Patient's Hb/Hct went from 11.9/39.1 -> 13.0/44.3 -> 12.0/41.5 -> 11.8/38.3 -Check Anemia Panel showed iron level 42, U IBC of 293, TIBC of 335, saturation ratios of 13%, ferritin level 54, folate level 11.7, and vitamin B12 of 786 -Continue to Monitor for S/Sx of Bleeding -Repeat CBC in AM   Morbid Obesity -Estimated body mass index is 43 kg/m as calculated from the following:   Height as of this encounter: 5\' 3"  (1.6 m).   Weight as of this encounter: 110.1 kg. -Weight Loss and Dietary Counseling given  -Encouraged patient to sit in the chair, walk tolerated  GERD -C/w Pantoprazole 40 mg po Daily   HLD -Not on any Statin at Home  Elevated BUN -In the setting of Steroid Demargination -Patient's BUN is 35 and dissociated from Cr -Continue to Monitor closely   DVT prophylaxis: Enoxaparin 55 mg sq q24h Code Status: FULL CODE  Family Communication: No family present at bedside  Disposition Plan: Anticipate D/C Home in the next 24-48 Hours if medically stable and Respiratory status back to baseline but still diuresing and still on IV Steroids and will need  to re-evaluate in the AM   Consultants:   None   Procedures: None  Antimicrobials:  Anti-infectives (From admission, onward)   Start     Dose/Rate Route Frequency Ordered Stop   01/05/19 1600  vancomycin (VANCOCIN) 1,500 mg in sodium chloride 0.9 % 500 mL IVPB  Status:  Discontinued     1,500 mg 250 mL/hr over 120 Minutes  Intravenous Every 24 hours 01/04/19 1521 01/07/19 1252   01/04/19 1500  ceFEPIme (MAXIPIME) 1 g in sodium chloride 0.9 % 100 mL IVPB  Status:  Discontinued     1 g 200 mL/hr over 30 Minutes Intravenous Every 12 hours 01/04/19 1351 01/07/19 1252   01/04/19 1500  vancomycin (VANCOCIN) 2,000 mg in sodium chloride 0.9 % 500 mL IVPB  Status:  Discontinued     2,000 mg 250 mL/hr over 120 Minutes Intravenous  Once 01/04/19 1359 01/04/19 1400   01/04/19 1500  vancomycin (VANCOCIN) 2,500 mg in sodium chloride 0.9 % 500 mL IVPB     2,500 mg 250 mL/hr over 120 Minutes Intravenous  Once 01/04/19 1400 01/04/19 1930   01/03/19 0930  cefTRIAXone (ROCEPHIN) 1 g in sodium chloride 0.9 % 100 mL IVPB  Status:  Discontinued     1 g 200 mL/hr over 30 Minutes Intravenous Every 24 hours 01/03/19 0839 01/04/19 1351   01/02/19 1000  doxycycline (VIBRA-TABS) tablet 200 mg     200 mg Oral Daily 01/01/19 2251 01/08/19 8757     Subjective: Seen And examined at bedside states that he is "getting there".  Still does not feel back to her full baseline but feels closer to it today and then she did yesterday.  No nausea or vomiting.  Still had some wheezing.  Feels a little swollen today.  No lightheadedness or dizziness.  Is able to ambulate without oxygen today which is a change from her baseline and improvement.  We will continue diuresis another day and she is agreeable for this and reevaluate in the a.m. for possible discharge and she is agreeable with the plan.   Objective: Vitals:   01/08/19 2008 01/08/19 2257 01/09/19 0819 01/09/19 0856  BP:  (!) 100/50 126/61   Pulse:  71 64 73  Resp:  18 18 18   Temp:  98 F (36.7 C) 98.2 F (36.8 C)   TempSrc:  Oral Oral   SpO2: 99% (!) 85% 95% 97%  Weight:      Height:        Intake/Output Summary (Last 24 hours) at 01/09/2019 1506 Last data filed at 01/09/2019 9728 Gross per 24 hour  Intake 236 ml  Output --  Net 236 ml   Filed Weights   01/06/19 0600 01/07/19  0500 01/08/19 0330  Weight: 113 kg 111.1 kg 110.1 kg   Examination: Physical Exam:  Constitutional: Well-developed morbidly obese African-American female currently no acute distress appears calm this morning and appears improved from yesterday and states that she is getting closer to baseline Eyes: Conjunctive are normal.  Sclera nonicteric ENMT: Normal ears nose appear normal.  Grossly normal hearing.  Mucous murmurs are moist Neck: Appears supple and JVD is difficult to assess given her body habitus Respiratory: Diminished to Auscultation bilaterally coarse breath sounds and still had some expiratory wheezing at the bases in the posterior lung fields.  Has a normal respiratory effort and she is not tachypneic but is still wearing supplemental oxygen via nasal cannula this morning Cardiovascular: Rate and rhythm.  No appreciable murmurs, rubs, gallops.  Has some lower extremity edema  Abdomen: Soft, nontender, distended secondary body habitus.  Bowel sounds present GU: Deferred Musculoskeletal: No contractures or cyanosis.  No joint deformities in the upper lower extremities Skin: Is warm and dry with no appreciable rashes or lesions on today's evaluation Neurologic: CN 2-12 intact no appreciable focal deficits.  Romberg sign and cerebellar reflexes were not assessed Psychiatric: Has a normal judgment and insight.  Patient is awake and alert and oriented x3  Data Reviewed: I have personally reviewed following labs and imaging studies  CBC: Recent Labs  Lab 01/04/19 0218 01/06/19 0217 01/07/19 0931 01/08/19 0204 01/09/19 0141  WBC 27.6* 20.2* 22.7* 34.8* 26.9*  NEUTROABS  --   --  18.0* 22.6* 23.2*  HGB 11.3* 11.9* 13.0 12.0 11.8*  HCT 38.6 39.1 44.3 41.5 38.3  MCV 77.0* 76.7* 77.2* 77.6* 76.6*  PLT 181 190 224 243 225   Basic Metabolic Panel: Recent Labs  Lab 01/04/19 0218 01/06/19 0217 01/07/19 0931 01/08/19 0204 01/09/19 0141  NA 136 135 139 139 135  K 4.7 4.3 4.5 3.8  4.2  CL 101 98 98 101 96*  CO2 29 30 33* 31 28  GLUCOSE 230* 274* 171* 53* 152*  BUN 25* 32* 22 32* 35*  CREATININE 0.76 0.89 0.65 0.69 0.92  CALCIUM 8.7* 8.6* 8.8* 8.9 8.6*  MG  --   --  2.6* 2.7* 2.4  PHOS  --   --  3.5 4.1  --    GFR: Estimated Creatinine Clearance: 68.8 mL/min (by C-G formula based on SCr of 0.92 mg/dL). Liver Function Tests: Recent Labs  Lab 01/07/19 0931 01/08/19 0204 01/09/19 0141  AST 14* 17 21  ALT 27 29 33  ALKPHOS 76 73 71  BILITOT 0.6 0.7 0.6  PROT 6.5 6.0* 6.1*  ALBUMIN 3.1* 2.8* 2.8*   No results for input(s): LIPASE, AMYLASE in the last 168 hours. No results for input(s): AMMONIA in the last 168 hours. Coagulation Profile: No results for input(s): INR, PROTIME in the last 168 hours. Cardiac Enzymes: No results for input(s): CKTOTAL, CKMB, CKMBINDEX, TROPONINI in the last 168 hours. BNP (last 3 results) No results for input(s): PROBNP in the last 8760 hours. HbA1C: No results for input(s): HGBA1C in the last 72 hours. CBG: Recent Labs  Lab 01/08/19 1645 01/08/19 2044 01/09/19 0305 01/09/19 0816 01/09/19 1123  GLUCAP 359* 225* 169* 180* 222*   Lipid Profile: No results for input(s): CHOL, HDL, LDLCALC, TRIG, CHOLHDL, LDLDIRECT in the last 72 hours. Thyroid Function Tests: No results for input(s): TSH, T4TOTAL, FREET4, T3FREE, THYROIDAB in the last 72 hours. Anemia Panel: Recent Labs    01/09/19 0141  VITAMINB12 786  FOLATE 11.7  FERRITIN 54  TIBC 335  IRON 42  RETICCTPCT 1.1   Sepsis Labs: Recent Labs  Lab 01/07/19 1312 01/08/19 0204 01/09/19 0141  PROCALCITON <0.10 <0.10 <0.10    Recent Results (from the past 240 hour(s))  MRSA PCR Screening     Status: Abnormal   Collection Time: 01/02/19 10:27 AM  Result Value Ref Range Status   MRSA by PCR POSITIVE (A) NEGATIVE Final    Comment:        The GeneXpert MRSA Assay (FDA approved for NASAL specimens only), is one component of a comprehensive MRSA  colonization surveillance program. It is not intended to diagnose MRSA infection nor to guide or monitor treatment for MRSA infections. RESULT CALLED TO, READ BACK BY AND VERIFIED WITH: Linward Foster RN 12:30 01/02/19 ([email or web address])  Performed at [facility], [address], [place], [place] [address]   Respiratory Panel by PCR     Status: None   Collection Time: 01/07/19  7:58 AM  Result Value Ref Range Status   Adenovirus NOT DETECTED NOT DETECTED Final   Coronavirus 229E NOT DETECTED NOT DETECTED Final    Comment: (NOTE) The Coronavirus on the Respiratory Panel, DOES NOT test for the novel  Coronavirus (2019 nCoV)    Coronavirus HKU1 NOT DETECTED NOT DETECTED Final   Coronavirus NL63 NOT DETECTED NOT DETECTED Final   Coronavirus OC43 NOT DETECTED NOT DETECTED Final   Metapneumovirus NOT DETECTED NOT DETECTED Final   Rhinovirus / Enterovirus NOT DETECTED NOT DETECTED Final   Influenza A NOT DETECTED NOT DETECTED Final   Influenza B NOT DETECTED NOT DETECTED Final   Parainfluenza Virus 1 NOT DETECTED NOT DETECTED Final   Parainfluenza Virus 2 NOT DETECTED NOT DETECTED Final   Parainfluenza Virus 3 NOT DETECTED NOT DETECTED Final   Parainfluenza Virus 4 NOT DETECTED NOT DETECTED Final   Respiratory Syncytial Virus NOT DETECTED NOT DETECTED Final   Bordetella pertussis NOT DETECTED NOT DETECTED Final   Chlamydophila pneumoniae NOT DETECTED NOT DETECTED Final   Mycoplasma pneumoniae NOT DETECTED NOT DETECTED Final    Comment: Performed at [facility], [address] [place], [place] [address]    Radiology Studies: Dg Chest [place] 1 View  Result Date: 01/09/2019 CLINICAL DATA:  68 year old female with a history of shortness of breath EXAM: PORTABLE CHEST 1 VIEW COMPARISON:  01/07/2019 FINDINGS: Cardiomediastinal silhouette unchanged in size and contour. Calcifications of the aortic arch. No pneumothorax or pleural effusion. Similar appearance of coarsened interstitial markings  without evidence of central vascular congestion or interlobular septal thickening. Linear opacity at the left lung base. No confluent airspace disease. No displaced fracture IMPRESSION: Chronic changes and basilar atelectasis/scarring with no evidence of acute cardiopulmonary disease Electronically Signed   By: Gilmer Mor D.O.   On: 01/09/2019 09:05   Scheduled Meds:  arformoterol  15 mcg Nebulization BID   aspirin EC  81 mg Oral Daily   budesonide (PULMICORT) nebulizer solution  0.5 mg Nebulization BID   Chlorhexidine Gluconate Cloth  6 each Topical Q0600   enoxaparin (LOVENOX) injection  55 mg Subcutaneous Q24H   furosemide  60 mg Intravenous BID   guaiFENesin  1,200 mg Oral BID   insulin aspart  0-15 Units Subcutaneous TID WC   insulin aspart  0-5 Units Subcutaneous QHS   insulin aspart  12 Units Subcutaneous TID WC   insulin glargine  28 Units Subcutaneous BID   ipratropium-albuterol  3 mL Nebulization BID   methylPREDNISolone (SOLU-MEDROL) injection  60 mg Intravenous Q12H   montelukast  10 mg Oral BH-q7a   multivitamin with minerals  1 tablet Oral QAC lunch   oxybutynin  15 mg Oral Daily   pantoprazole  40 mg Oral Daily   sodium chloride flush  3 mL Intravenous Q12H   Continuous Infusions:  sodium chloride 10 mL (01/02/19 0135)    LOS: 8 days   Merlene Laughter, DO [facility] PAGER is on AMION  If 7PM-7AM, please contact night-coverage [email or web address] Password [facility] 01/09/2019, 3:06 PM

## 2019-01-09 NOTE — Progress Notes (Signed)
Physical Therapy Treatment Patient Details Name: Kelli Rivera Nolden MRN: [id number] DOB: 04/30/1951 Today's Date: 01/09/2019    History of Present Illness Pt is a 68 y/o female who presents s/Rivera COPD exacerbation. PMH significant for morbid obesity, DM, COPD.     PT Comments    Pt progressing well, ambulated >400 with RW on RA with SpO2 in 90's. However, pt unable to pick up pace to community speed without increased SOB. Discussed energy conservation techniques as well as HEP for strengthening and balance. PT will continue to follow.    Follow Up Recommendations  No PT follow up;Supervision - Intermittent     Equipment Recommendations  None recommended by PT    Recommendations for Other Services       Precautions / Restrictions Precautions Precautions: Fall Precaution Comments: Watch O2 stats Restrictions Weight Bearing Restrictions: No    Mobility  Bed Mobility               General bed mobility comments: pt sitting  in chair upon PT arrival  Transfers Overall transfer level: Needs assistance Equipment used: Rolling walker (2 wheeled) Transfers: Sit to/from Stand Sit to Stand: Supervision         General transfer comment: no difficulty from chair with arms  Ambulation/Gait Ambulation/Gait assistance: Supervision Gait Distance (Feet): 405 Feet Assistive device: Rolling walker (2 wheeled) Gait Pattern/deviations: Step-through pattern Gait velocity: decreased Gait velocity interpretation: <1.31 ft/sec, indicative of household ambulator General Gait Details: slow, steady gait, pt unable to increase pace due to SOB. 2 standing rest breaks taken. SpO2 remained in 90's on RA.    Stairs             Wheelchair Mobility    Modified Rankin (Stroke Patients Only)       Balance Overall balance assessment: Needs assistance Sitting-balance support: Feet supported;No upper extremity supported Sitting balance-Leahy Scale: Normal     Standing balance  support: No upper extremity supported;During functional activity Standing balance-Leahy Scale: Fair Standing balance comment: can maintain static standing but requires RW for ambulation                            Cognition Arousal/Alertness: Awake/alert Behavior During Therapy: WFL for tasks assessed/performed Overall Cognitive Status: Within Functional Limits for tasks assessed                                        Exercises General Exercises - Lower Extremity Hip Flexion/Marching: AROM;Both;10 reps;Standing Heel Raises: AROM;Both;10 reps;Standing Other Exercises Other Exercises: standing eyes closed x 30 sec Other Exercises: standing torso twists Other Exercises: standing lateral flexion, UE unsupported    General Comments General comments (skin integrity, edema, etc.): continues to have productive cough      Pertinent Vitals/Pain Pain Assessment: No/denies pain    Home Living                      Prior Function            PT Goals (current goals can now be found in the care plan section) Acute Rehab PT Goals Patient Stated Goal: home soon PT Goal Formulation: With patient Time For Goal Achievement: 01/11/19 Potential to Achieve Goals: Good Progress towards PT goals: Progressing toward goals    Frequency    Min 3X/week      PT Plan  Current plan remains appropriate    Co-evaluation              AM-PAC PT "6 Clicks" Mobility   Outcome Measure  Help needed turning from your back to your side while in a flat bed without using bedrails?: None Help needed moving from lying on your back to sitting on the side of a flat bed without using bedrails?: None Help needed moving to and from a bed to a chair (including a wheelchair)?: None Help needed standing up from a chair using your arms (e.g., wheelchair or bedside chair)?: None Help needed to walk in hospital room?: None Help needed climbing 3-5 steps with a railing? : A  Little 6 Click Score: 23    End of Session Equipment Utilized During Treatment: (O2 taken but not needed) Activity Tolerance: Patient tolerated treatment well Patient left: with call bell/phone within reach;in chair Nurse Communication: Mobility status PT Visit Diagnosis: Unsteadiness on feet (R26.81);Muscle weakness (generalized) (M62.81)     Time: 7408-1448 PT Time Calculation (min) (ACUTE ONLY): 24 min  Charges:  $Gait Training: 8-22 mins $Therapeutic Exercise: 8-22 mins                     Lyanne Co, PT  Acute Rehab Services  Pager [phone] Office [phone]    Lawana Chambers Vickye Astorino 01/09/2019, 10:38 AM

## 2019-01-10 ENCOUNTER — Inpatient Hospital Stay (HOSPITAL_COMMUNITY): Payer: Medicare Other

## 2019-01-10 LAB — CBC WITH DIFFERENTIAL/PLATELET
Abs Immature Granulocytes: 0.68 10*3/uL — ABNORMAL HIGH (ref 0.00–0.07)
Basophils Absolute: 0 10*3/uL (ref 0.0–0.1)
Basophils Relative: 0 %
EOS ABS: 0 10*3/uL (ref 0.0–0.5)
EOS PCT: 0 %
HCT: 40.3 % (ref 36.0–46.0)
Hemoglobin: 12 g/dL (ref 12.0–15.0)
Immature Granulocytes: 3 %
Lymphocytes Relative: 9 %
Lymphs Abs: 2.1 10*3/uL (ref 0.7–4.0)
MCH: 22.9 pg — AB (ref 26.0–34.0)
MCHC: 29.8 g/dL — AB (ref 30.0–36.0)
MCV: 76.9 fL — ABNORMAL LOW (ref 80.0–100.0)
MONO ABS: 0.6 10*3/uL (ref 0.1–1.0)
Monocytes Relative: 3 %
Neutro Abs: 19.7 10*3/uL — ABNORMAL HIGH (ref 1.7–7.7)
Neutrophils Relative %: 85 %
Platelets: 233 10*3/uL (ref 150–400)
RBC: 5.24 MIL/uL — ABNORMAL HIGH (ref 3.87–5.11)
RDW: 17.9 % — ABNORMAL HIGH (ref 11.5–15.5)
WBC: 23.1 10*3/uL — ABNORMAL HIGH (ref 4.0–10.5)
nRBC: 0 % (ref 0.0–0.2)

## 2019-01-10 LAB — GLUCOSE, CAPILLARY
Glucose-Capillary: 198 mg/dL — ABNORMAL HIGH (ref 70–99)
Glucose-Capillary: 204 mg/dL — ABNORMAL HIGH (ref 70–99)
Glucose-Capillary: 273 mg/dL — ABNORMAL HIGH (ref 70–99)

## 2019-01-10 LAB — COMPREHENSIVE METABOLIC PANEL
ALT: 30 U/L (ref 0–44)
ANION GAP: 9 (ref 5–15)
AST: 14 U/L — ABNORMAL LOW (ref 15–41)
Albumin: 2.9 g/dL — ABNORMAL LOW (ref 3.5–5.0)
Alkaline Phosphatase: 69 U/L (ref 38–126)
BILIRUBIN TOTAL: 0.8 mg/dL (ref 0.3–1.2)
BUN: 36 mg/dL — ABNORMAL HIGH (ref 8–23)
CO2: 32 mmol/L (ref 22–32)
Calcium: 8.8 mg/dL — ABNORMAL LOW (ref 8.9–10.3)
Chloride: 96 mmol/L — ABNORMAL LOW (ref 98–111)
Creatinine, Ser: 0.84 mg/dL (ref 0.44–1.00)
GFR calc Af Amer: >60 mL/min (ref >60)
GFR calc non Af Amer: >60 mL/min (ref >60)
Glucose, Bld: 177 mg/dL — ABNORMAL HIGH (ref 70–99)
Potassium: 4.6 mmol/L (ref 3.5–5.1)
Sodium: 137 mmol/L (ref 135–145)
Total Protein: 6 g/dL — ABNORMAL LOW (ref 6.5–8.1)

## 2019-01-10 LAB — PHOSPHORUS: PHOSPHORUS: 4.4 mg/dL (ref 2.5–4.6)

## 2019-01-10 LAB — MAGNESIUM: Magnesium: 2.5 mg/dL — ABNORMAL HIGH (ref 1.7–2.4)

## 2019-01-10 MED ORDER — INSULIN ASPART 100 UNIT/ML FLEXPEN: 1.0000 [IU] | PEN_INJECTOR | Freq: Three times a day (TID) | SUBCUTANEOUS | 11 refills | Status: DC

## 2019-01-10 MED ORDER — PREDNISONE 20 MG PO TABS: 40.0000 mg | ORAL_TABLET | Freq: Every day | ORAL | 0 refills | Status: DC

## 2019-01-10 MED ORDER — HYDROCOD POLST-CPM POLST ER 10-8 MG/5ML PO SUER: 5.0000 mL | Freq: Two times a day (BID) | ORAL | 0 refills | Status: DC | PRN

## 2019-01-10 NOTE — Discharge Summary (Signed)
Physician Discharge Summary  Kelli Rivera WUJ:[id number] DOB: 02/07/1951 DOA: 01/01/2019  PCP: Angelica ChessmanAguiar, Rafaela M, MD  Admit date: 01/01/2019 Discharge date: 01/10/2019  Admitted From: Home Disposition: Home  Recommendations for Outpatient Follow-up:  1. Resume Enbrel the following week as long as she is stable     Discharge Condition: Stable CODE STATUS: Full code Diet recommendation: Heart healthy and diabetic Consultations:  None   Discharge Diagnoses:  Principal Problem:   COPD with acute exacerbation (HCC) Active Problems:   Leukocytosis   Diabetes mellitus type 2 in obese (HCC)   RA (rheumatoid arthritis) (HCC) Morbid obesity   Chronic diastolic CHF (congestive heart failure) (HCC)  Brief Summary: Kelli P Robertsis a 68 y.o.femalewith medical history significant forCOPD on 2 L of oxygen at home, nocturnal supplemental oxygen requirement, insulin-dependent diabetes mellitus, chronic leukocytosis, rheumatoid arthritis, and other comorbidities who presented to the emergency department for worsening of shortness of breath, increased productive cough, and wheezing.  The patient reports that symptoms developed proximately 2 weeks ago, she was started on prednisone taper and an unknown antibiotic, had no significant improvement, and was started on a second course of steroids and antibiotic.  In the emergency department patient was found to have oxygen saturations of 90% on 2 L of oxygen, tachypneic, tachycardic with heart rate in 120s. Respiratory Virus Panel was negative and Procalcitonin Level was (<0.10 x2). CT scan of the chest was negative.   Hospital Course:  Acute hypoxic respiratory failure secondary to COPD exacerbation - Treated with IV steroids, IV antibiotics, nebulizer treatments and oxygen -She has completed antibiotics -She has now been weaned down to 2 L of oxygen which she uses at home-she has nebulizer treatments at home and Advair which she can continue -  She will continue 5 more days of prednisone and it is advised to follow-up with either her PCP or her pulmonologist next week  Chronic diastolic heart failure - Continue Lasix  Acute on chronic leukocytosis - WBC count was 41 on admission and has improved down to the 20s-of note she is still on oral steroids  Diabetes mellitus type 2 on insulin -Hemoglobin A1c 7.6 - Continue metformin, Trulicity and Toujeo- she was not using any NovoLog at home but I have ordered this for her to be used with sliding scale as she will be on Prednisone and her sugars may be higher than normal  Rheumatoid arthritis - Continue to hold Enbrel this weekend - She can most likely take it next week and  Anemia of chronic disease - Anemia panel reveals a normal ferritin, normal iron binding capacity, normal iron saturation, normal B12 level normal folate level  Morbid obesity Body mass index is 43 kg/m.  Gastroesophageal reflux disease - Continue PPI  Discharge Exam: Vitals:   01/10/19 0759 01/10/19 0801  BP:    Pulse:    Resp:    Temp:    SpO2: 98% 98%   Vitals:   01/10/19 0724 01/10/19 0758 01/10/19 0759 01/10/19 0801  BP: 130/73     Pulse: 63     Resp: 18     Temp: 98 F (36.7 C)     TempSrc: Oral     SpO2:  98% 98% 98%  Weight:      Height:        General: Pt is alert, awake, not in acute distress Cardiovascular: RRR, S1/S2 +, no rubs, no gallops Respiratory: CTA bilaterally, no wheezing, no rhonchi Abdominal: Soft, NT, ND, bowel sounds + Extremities:  no edema, no cyanosis   Discharge Instructions  Discharge Instructions    Diet - low sodium heart healthy   Complete by:  As directed    Diet Carb Modified   Complete by:  As directed    Increase activity slowly   Complete by:  As directed      Allergies as of 01/10/2019      Reactions   Liraglutide Nausea And Vomiting   Pioglitazone Swelling   Amoxicillin-pot Clavulanate Nausea And Vomiting   Did it involve swelling of  the face/tongue/throat, SOB, or low BP? No Did it involve sudden or severe rash/hives, skin peeling, or any reaction on the inside of your mouth or nose? No Did you need to seek medical attention at a hospital or doctor's office? No When did it last happen?less than 10 years If all above answers are "NO", may proceed with cephalosporin use.      Medication List    STOP taking these medications   benzonatate 200 MG capsule Commonly known as:  TESSALON   diphenhydrAMINE 50 MG tablet Commonly known as:  BENADRYL   Enbrel SureClick 50 MG/ML injection Generic drug:  etanercept     TAKE these medications   Advair Diskus 500-50 MCG/DOSE Aepb Generic drug:  Fluticasone-Salmeterol Inhale 1 puff into the lungs 2 (two) times daily.   albuterol 108 (90 Base) MCG/ACT inhaler Commonly known as:  PROVENTIL HFA;VENTOLIN HFA Inhale 2 puffs into the lungs every 4 (four) hours as needed for wheezing or shortness of breath.   aspirin EC 81 MG tablet Take 81 mg by mouth daily.   CENTRUM SILVER 50+WOMEN PO Take 1 tablet by mouth daily.   chlorpheniramine-HYDROcodone 10-8 MG/5ML Suer Commonly known as:  TUSSIONEX Take 5 mLs by mouth every 12 (twelve) hours as needed for cough.   fluticasone 50 MCG/ACT nasal spray Commonly known as:  FLONASE Place 2 sprays into both nostrils daily as needed for allergies.   furosemide 40 MG tablet Commonly known as:  LASIX Take 1 tablet (40 mg total) by mouth daily.   insulin aspart 100 UNIT/ML FlexPen Commonly known as:  NovoLOG FlexPen Inject 1-15 Units into the skin 3 (three) times daily with meals. CBG 121 - 150: 2 units  CBG 151 - 200: 3 units  CBG 201 - 250: 5 units  CBG 251 - 300: 8 units  CBG 301 - 350: 11 units  CBG 351 - 400: 15 units   ipratropium-albuterol 0.5-2.5 (3) MG/3ML Soln Commonly known as:  DUONEB Take 3 mLs by nebulization 4 (four) times daily. What changed:    when to take this  reasons to take this   metFORMIN  1000 MG tablet Commonly known as:  GLUCOPHAGE Take 1,000 mg by mouth daily with breakfast.   mometasone 0.1 % cream Commonly known as:  ELOCON Apply 1 application topically 2 (two) times daily as needed (irritation).   montelukast 10 MG tablet Commonly known as:  SINGULAIR Take 10 mg by mouth every morning.   omeprazole 40 MG capsule Commonly known as:  PRILOSEC Take 40 mg by mouth every morning.   oxybutynin 15 MG 24 hr tablet Commonly known as:  DITROPAN XL Take 15 mg by mouth daily.   predniSONE 20 MG tablet Commonly known as:  DELTASONE Take 2 tablets (40 mg total) by mouth daily.   Toujeo SoloStar 300 UNIT/ML Sopn Generic drug:  Insulin Glargine (1 Unit Dial) Inject 30 Units into the skin every morning.   Trulicity 1.5  MG/0.5ML Sopn Generic drug:  Dulaglutide Inject 1.5 mg into the skin every Thursday.       Allergies  Allergen Reactions  . Liraglutide Nausea And Vomiting  . Pioglitazone Swelling  . Amoxicillin-Pot Clavulanate Nausea And Vomiting    Did it involve swelling of the face/tongue/throat, SOB, or low BP? No Did it involve sudden or severe rash/hives, skin peeling, or any reaction on the inside of your mouth or nose? No Did you need to seek medical attention at a hospital or doctor's office? No When did it last happen?less than 10 years If all above answers are "NO", may proceed with cephalosporin use.      Procedures/Studies:   Dg Chest 2 View  Result Date: 01/01/2019 CLINICAL DATA:  68 year old female with 2 weeks of shortness of breath and congestion. EXAM: CHEST - 2 VIEW COMPARISON:  Chest radiographs 12/20/2018 and earlier. FINDINGS: Stable lung volumes. Mild chronic linear scarring in the lingula. Normal cardiac size and mediastinal contours. Visualized tracheal air column is within normal limits. No pneumothorax, pulmonary edema, pleural effusion or acute pulmonary opacity. No acute osseous abnormality identified. Stable cholecystectomy  clips. IMPRESSION: No acute cardiopulmonary abnormality. Electronically Signed   By: Odessa Fleming M.D.   On: 01/01/2019 17:48   Ct Chest Wo Contrast  Result Date: 01/04/2019 CLINICAL DATA:  68 year old female with persistent cough. EXAM: CT CHEST WITHOUT CONTRAST TECHNIQUE: Multidetector CT imaging of the chest was performed following the standard protocol without IV contrast. COMPARISON:  Chest radiographs 01/01/2019. CT Chest, Abdomen, and Pelvis 09/24/2017. FINDINGS: Cardiovascular: Mild Calcified aortic atherosclerosis. Chronic central pulmonary artery enlargement appears stable since 2018, better demonstrated on the prior study with contrast. Mild cardiomegaly is stable. No pericardial effusion. Vascular patency is not evaluated in the absence of IV contrast. Mediastinum/Nodes: Stable thyromegaly. No mediastinal lymphadenopathy. Lungs/Pleura: Major airways are patent. Evidence of centrilobular emphysema in both lungs. Mild scarring in the lingula and lateral basal segment of the left lower lobe is stable. Mild scarring in the medial segment of the right middle lobe is stable. No acute pulmonary opacity or pleural effusion. Upper Abdomen: Surgically absent gallbladder. Negative visible noncontrast liver, spleen, pancreas, adrenal glands, kidneys, and bowel. Musculoskeletal: No acute osseous abnormality identified. IMPRESSION: 1. No acute findings in the chest. 2. Chronic Emphysema (ICD10-J43.9). Chronic central pulmonary artery enlargement suggesting a degree of pulmonary artery hypertension. Electronically Signed   By: Odessa Fleming M.D.   On: 01/04/2019 19:14   Dg Chest Port 1 View  Result Date: 01/10/2019 CLINICAL DATA:  Shortness of breath EXAM: PORTABLE CHEST 1 VIEW COMPARISON:  01/09/2019 FINDINGS: Unchanged AP portable examination with cardiomegaly and scarring or atelectasis of the left lung base. No new airspace opacity. IMPRESSION: Unchanged AP portable examination with cardiomegaly and scarring or  atelectasis of the left lung base. No new airspace opacity. Electronically Signed   By: Lauralyn Primes M.D.   On: 01/10/2019 08:10   Dg Chest Port 1 View  Result Date: 01/09/2019 CLINICAL DATA:  68 year old female with a history of shortness of breath EXAM: PORTABLE CHEST 1 VIEW COMPARISON:  01/07/2019 FINDINGS: Cardiomediastinal silhouette unchanged in size and contour. Calcifications of the aortic arch. No pneumothorax or pleural effusion. Similar appearance of coarsened interstitial markings without evidence of central vascular congestion or interlobular septal thickening. Linear opacity at the left lung base. No confluent airspace disease. No displaced fracture IMPRESSION: Chronic changes and basilar atelectasis/scarring with no evidence of acute cardiopulmonary disease Electronically Signed   By: Gilmer Mor  D.O.   On: 01/09/2019 09:05   Dg Chest Port 1 View  Result Date: 01/07/2019 CLINICAL DATA:  Shortness of breath EXAM: PORTABLE CHEST 1 VIEW COMPARISON:  CT chest dated 01/04/2019 FINDINGS: Lungs are essentially clear. Mild left basilar atelectasis. No focal consolidation. No pleural effusion or pneumothorax. The heart is mildly enlarged. Thoracic aortic atherosclerosis. Degenerative changes of the visualized thoracolumbar spine. IMPRESSION: No evidence of acute cardiopulmonary disease. Electronically Signed   By: Charline BillsSriyesh  Krishnan M.D.   On: 01/07/2019 09:44      The results of significant diagnostics from this hospitalization (including imaging, microbiology, ancillary and laboratory) are listed below for reference.     Microbiology: Recent Results (from the past 240 hour(s))  MRSA PCR Screening     Status: Abnormal   Collection Time: 01/02/19 10:27 AM  Result Value Ref Range Status   MRSA by PCR POSITIVE (A) NEGATIVE Final    Comment:        The GeneXpert MRSA Assay (FDA approved for NASAL specimens only), is one component of a comprehensive MRSA colonization surveillance  program. It is not intended to diagnose MRSA infection nor to guide or monitor treatment for MRSA infections. RESULT CALLED TO, READ BACK BY AND VERIFIED WITH: Linward Foster. Duncan RN 12:30 01/02/19 ([email or web address]) Performed at [facility], [address] [place], [place] [address]   Respiratory Panel by PCR     Status: None   Collection Time: 01/07/19  7:58 AM  Result Value Ref Range Status   Adenovirus NOT DETECTED NOT DETECTED Final   Coronavirus 229E NOT DETECTED NOT DETECTED Final    Comment: (NOTE) The Coronavirus on the Respiratory Panel, DOES NOT test for the novel  Coronavirus (2019 nCoV)    Coronavirus HKU1 NOT DETECTED NOT DETECTED Final   Coronavirus NL63 NOT DETECTED NOT DETECTED Final   Coronavirus OC43 NOT DETECTED NOT DETECTED Final   Metapneumovirus NOT DETECTED NOT DETECTED Final   Rhinovirus / Enterovirus NOT DETECTED NOT DETECTED Final   Influenza A NOT DETECTED NOT DETECTED Final   Influenza B NOT DETECTED NOT DETECTED Final   Parainfluenza Virus 1 NOT DETECTED NOT DETECTED Final   Parainfluenza Virus 2 NOT DETECTED NOT DETECTED Final   Parainfluenza Virus 3 NOT DETECTED NOT DETECTED Final   Parainfluenza Virus 4 NOT DETECTED NOT DETECTED Final   Respiratory Syncytial Virus NOT DETECTED NOT DETECTED Final   Bordetella pertussis NOT DETECTED NOT DETECTED Final   Chlamydophila pneumoniae NOT DETECTED NOT DETECTED Final   Mycoplasma pneumoniae NOT DETECTED NOT DETECTED Final    Comment: Performed at [facility], [address] [place], [place] [address]     Labs: BNP (last 3 results) Recent Labs    01/01/19 1717  BNP 22.5   Basic Metabolic Panel: Recent Labs  Lab 01/06/19 0217 01/07/19 0931 01/08/19 0204 01/09/19 0141 01/10/19 0206  NA 135 139 139 135 137  K 4.3 4.5 3.8 4.2 4.6  CL 98 98 101 96* 96*  CO2 30 33* 31 28 32  GLUCOSE 274* 171* 53* 152* 177*  BUN 32* 22 32* 35* 36*  CREATININE 0.89 0.65 0.69 0.92 0.84  CALCIUM 8.6* 8.8* 8.9 8.6*  8.8*  MG  --  2.6* 2.7* 2.4 2.5*  PHOS  --  3.5 4.1  --  4.4   Liver Function Tests: Recent Labs  Lab 01/07/19 0931 01/08/19 0204 01/09/19 0141 01/10/19 0206  AST 14* 17 21 14*  ALT 27 29 33 30  ALKPHOS  76 73 71 69  BILITOT 0.6 0.7 0.6 0.8  PROT 6.5 6.0* 6.1* 6.0*  ALBUMIN 3.1* 2.8* 2.8* 2.9*   No results for input(s): LIPASE, AMYLASE in the last 168 hours. No results for input(s): AMMONIA in the last 168 hours. CBC: Recent Labs  Lab 01/06/19 0217 01/07/19 0931 01/08/19 0204 01/09/19 0141 01/10/19 0206  WBC 20.2* 22.7* 34.8* 26.9* 23.1*  NEUTROABS  --  18.0* 22.6* 23.2* 19.7*  HGB 11.9* 13.0 12.0 11.8* 12.0  HCT 39.1 44.3 41.5 38.3 40.3  MCV 76.7* 77.2* 77.6* 76.6* 76.9*  PLT 190 224 243 225 233   Cardiac Enzymes: No results for input(s): CKTOTAL, CKMB, CKMBINDEX, TROPONINI in the last 168 hours. BNP: Invalid input(s): POCBNP CBG: Recent Labs  Lab 01/09/19 1531 01/09/19 2153 01/10/19 0325 01/10/19 0721 01/10/19 1125  GLUCAP 299* 71 198* 204* 273*   D-Dimer No results for input(s): DDIMER in the last 72 hours. Hgb A1c No results for input(s): HGBA1C in the last 72 hours. Lipid Profile No results for input(s): CHOL, HDL, LDLCALC, TRIG, CHOLHDL, LDLDIRECT in the last 72 hours. Thyroid function studies No results for input(s): TSH, T4TOTAL, T3FREE, THYROIDAB in the last 72 hours.  Invalid input(s): FREET3 Anemia work up Recent Labs    01/09/19 0141  VITAMINB12 786  FOLATE 11.7  FERRITIN 54  TIBC 335  IRON 42  RETICCTPCT 1.1   Urinalysis    Component Value Date/Time   COLORURINE YELLOW 07/05/2018 2327   APPEARANCEUR HAZY (A) 07/05/2018 2327   LABSPEC 1.025 07/05/2018 2327   PHURINE 6.0 07/05/2018 2327   GLUCOSEU NEGATIVE 07/05/2018 2327   HGBUR NEGATIVE 07/05/2018 2327   BILIRUBINUR SMALL (A) 07/05/2018 2327   KETONESUR 15 (A) 07/05/2018 2327   PROTEINUR NEGATIVE 07/05/2018 2327   NITRITE POSITIVE (A) 07/05/2018 2327   LEUKOCYTESUR TRACE  (A) 07/05/2018 2327   Sepsis Labs Invalid input(s): PROCALCITONIN,  WBC,  LACTICIDVEN Microbiology Recent Results (from the past 240 hour(s))  MRSA PCR Screening     Status: Abnormal   Collection Time: 01/02/19 10:27 AM  Result Value Ref Range Status   MRSA by PCR POSITIVE (A) NEGATIVE Final    Comment:        The GeneXpert MRSA Assay (FDA approved for NASAL specimens only), is one component of a comprehensive MRSA colonization surveillance program. It is not intended to diagnose MRSA infection nor to guide or monitor treatment for MRSA infections. RESULT CALLED TO, READ BACK BY AND VERIFIED WITH: Linward Foster RN 12:30 01/02/19 ([email or web address]) Performed at [facility], [address], [place], [place] [address]   Respiratory Panel by PCR     Status: None   Collection Time: 01/07/19  7:58 AM  Result Value Ref Range Status   Adenovirus NOT DETECTED NOT DETECTED Final   Coronavirus 229E NOT DETECTED NOT DETECTED Final    Comment: (NOTE) The Coronavirus on the Respiratory Panel, DOES NOT test for the novel  Coronavirus (2019 nCoV)    Coronavirus HKU1 NOT DETECTED NOT DETECTED Final   Coronavirus NL63 NOT DETECTED NOT DETECTED Final   Coronavirus OC43 NOT DETECTED NOT DETECTED Final   Metapneumovirus NOT DETECTED NOT DETECTED Final   Rhinovirus / Enterovirus NOT DETECTED NOT DETECTED Final   Influenza A NOT DETECTED NOT DETECTED Final   Influenza B NOT DETECTED NOT DETECTED Final   Parainfluenza Virus 1 NOT DETECTED NOT DETECTED Final   Parainfluenza Virus 2 NOT DETECTED NOT DETECTED Final   Parainfluenza Virus 3 NOT DETECTED  NOT DETECTED Final   Parainfluenza Virus 4 NOT DETECTED NOT DETECTED Final   Respiratory Syncytial Virus NOT DETECTED NOT DETECTED Final   Bordetella pertussis NOT DETECTED NOT DETECTED Final   Chlamydophila pneumoniae NOT DETECTED NOT DETECTED Final   Mycoplasma pneumoniae NOT DETECTED NOT DETECTED Final    Comment: Performed at [facility],  [address], [place], [place] [address]     Time coordinating discharge in minutes: 60  SIGNED:   Calvert Cantor, MD  [facility] 01/10/2019, 1:07 PM Pager   If 7PM-7AM, please contact night-coverage [email or web address] Password [email or web address]

## 2019-02-01 ENCOUNTER — Telehealth (INDEPENDENT_AMBULATORY_CARE_PROVIDER_SITE_OTHER): Payer: Self-pay

## 2019-02-01 NOTE — Telephone Encounter (Signed)
Talked with patient to confirm appointment for Monday, 02/05/2019.  Patient stated that she will call back to R/S.

## 2019-02-05 ENCOUNTER — Ambulatory Visit (INDEPENDENT_AMBULATORY_CARE_PROVIDER_SITE_OTHER): Payer: Medicare Other | Admitting: Orthopaedic Surgery

## 2019-12-24 DIAGNOSIS — R0789 Other chest pain: Secondary | ICD-10-CM

## 2019-12-24 HISTORY — DX: Other chest pain: R07.89

## 2020-01-22 ENCOUNTER — Emergency Department (HOSPITAL_BASED_OUTPATIENT_CLINIC_OR_DEPARTMENT_OTHER): Payer: Medicare Other

## 2020-01-22 ENCOUNTER — Other Ambulatory Visit: Payer: Self-pay

## 2020-01-22 ENCOUNTER — Encounter (HOSPITAL_BASED_OUTPATIENT_CLINIC_OR_DEPARTMENT_OTHER): Payer: Self-pay | Admitting: Emergency Medicine

## 2020-01-22 ENCOUNTER — Inpatient Hospital Stay (HOSPITAL_BASED_OUTPATIENT_CLINIC_OR_DEPARTMENT_OTHER)
Admission: EM | Admit: 2020-01-22 | Discharge: 2020-01-24 | DRG: 313 | Disposition: A | Discharge status: Home / Self Care | Payer: Medicare Other | Attending: Cardiovascular Disease | Admitting: Cardiovascular Disease

## 2020-01-22 DIAGNOSIS — Z7952 Long term (current) use of systemic steroids: Secondary | ICD-10-CM

## 2020-01-22 DIAGNOSIS — Z6841 Body Mass Index (BMI) 40.0 and over, adult: Secondary | ICD-10-CM

## 2020-01-22 DIAGNOSIS — R072 Precordial pain: Principal | ICD-10-CM | POA: Diagnosis present

## 2020-01-22 DIAGNOSIS — Z20822 Contact with and (suspected) exposure to covid-19: Secondary | ICD-10-CM | POA: Diagnosis present

## 2020-01-22 DIAGNOSIS — E119 Type 2 diabetes mellitus without complications: Secondary | ICD-10-CM | POA: Diagnosis present

## 2020-01-22 DIAGNOSIS — Z8249 Family history of ischemic heart disease and other diseases of the circulatory system: Secondary | ICD-10-CM

## 2020-01-22 DIAGNOSIS — R931 Abnormal findings on diagnostic imaging of heart and coronary circulation: Secondary | ICD-10-CM

## 2020-01-22 DIAGNOSIS — Z7951 Long term (current) use of inhaled steroids: Secondary | ICD-10-CM

## 2020-01-22 DIAGNOSIS — Z880 Allergy status to penicillin: Secondary | ICD-10-CM | POA: Diagnosis not present

## 2020-01-22 DIAGNOSIS — Z794 Long term (current) use of insulin: Secondary | ICD-10-CM | POA: Diagnosis not present

## 2020-01-22 DIAGNOSIS — Z888 Allergy status to other drugs, medicaments and biological substances status: Secondary | ICD-10-CM | POA: Diagnosis not present

## 2020-01-22 DIAGNOSIS — G4733 Obstructive sleep apnea (adult) (pediatric): Secondary | ICD-10-CM | POA: Diagnosis present

## 2020-01-22 DIAGNOSIS — Z7982 Long term (current) use of aspirin: Secondary | ICD-10-CM | POA: Diagnosis not present

## 2020-01-22 DIAGNOSIS — Z79899 Other long term (current) drug therapy: Secondary | ICD-10-CM

## 2020-01-22 DIAGNOSIS — Z87891 Personal history of nicotine dependence: Secondary | ICD-10-CM

## 2020-01-22 DIAGNOSIS — I214 Non-ST elevation (NSTEMI) myocardial infarction: Secondary | ICD-10-CM | POA: Diagnosis not present

## 2020-01-22 DIAGNOSIS — E785 Hyperlipidemia, unspecified: Secondary | ICD-10-CM | POA: Diagnosis present

## 2020-01-22 DIAGNOSIS — I2510 Atherosclerotic heart disease of native coronary artery without angina pectoris: Secondary | ICD-10-CM | POA: Diagnosis present

## 2020-01-22 DIAGNOSIS — J449 Chronic obstructive pulmonary disease, unspecified: Secondary | ICD-10-CM | POA: Diagnosis present

## 2020-01-22 DIAGNOSIS — Z90710 Acquired absence of both cervix and uterus: Secondary | ICD-10-CM | POA: Diagnosis not present

## 2020-01-22 DIAGNOSIS — I083 Combined rheumatic disorders of mitral, aortic and tricuspid valves: Secondary | ICD-10-CM | POA: Diagnosis present

## 2020-01-22 DIAGNOSIS — R079 Chest pain, unspecified: Secondary | ICD-10-CM

## 2020-01-22 LAB — CBC WITH DIFFERENTIAL/PLATELET
Abs Immature Granulocytes: 0.07 10*3/uL (ref 0.00–0.07)
Basophils Absolute: 0.1 10*3/uL (ref 0.0–0.1)
Basophils Relative: 0 %
Eosinophils Absolute: 1.5 10*3/uL — ABNORMAL HIGH (ref 0.0–0.5)
Eosinophils Relative: 12 %
HCT: 42.7 % (ref 36.0–46.0)
Hemoglobin: 12.7 g/dL (ref 12.0–15.0)
Immature Granulocytes: 1 %
Lymphocytes Relative: 24 %
Lymphs Abs: 3 10*3/uL (ref 0.7–4.0)
MCH: 24.3 pg — ABNORMAL LOW (ref 26.0–34.0)
MCHC: 29.7 g/dL — ABNORMAL LOW (ref 30.0–36.0)
MCV: 81.6 fL (ref 80.0–100.0)
Monocytes Absolute: 0.8 10*3/uL (ref 0.1–1.0)
Monocytes Relative: 6 %
Neutro Abs: 7 10*3/uL (ref 1.7–7.7)
Neutrophils Relative %: 57 %
Platelets: 205 10*3/uL (ref 150–400)
RBC: 5.23 MIL/uL — ABNORMAL HIGH (ref 3.87–5.11)
RDW: 16.8 % — ABNORMAL HIGH (ref 11.5–15.5)
WBC: 12.4 10*3/uL — ABNORMAL HIGH (ref 4.0–10.5)
nRBC: 0 % (ref 0.0–0.2)

## 2020-01-22 LAB — COMPREHENSIVE METABOLIC PANEL
ALT: 10 U/L (ref 0–44)
AST: 12 U/L — ABNORMAL LOW (ref 15–41)
Albumin: 3.4 g/dL — ABNORMAL LOW (ref 3.5–5.0)
Alkaline Phosphatase: 61 U/L (ref 38–126)
Anion gap: 7 (ref 5–15)
BUN: 16 mg/dL (ref 8–23)
CO2: 27 mmol/L (ref 22–32)
Calcium: 8.6 mg/dL — ABNORMAL LOW (ref 8.9–10.3)
Chloride: 105 mmol/L (ref 98–111)
Creatinine, Ser: 0.67 mg/dL (ref 0.44–1.00)
GFR calc Af Amer: >60 mL/min (ref >60)
GFR calc non Af Amer: >60 mL/min (ref >60)
Glucose, Bld: 106 mg/dL — ABNORMAL HIGH (ref 70–99)
Potassium: 3.7 mmol/L (ref 3.5–5.1)
Sodium: 139 mmol/L (ref 135–145)
Total Bilirubin: 0.7 mg/dL (ref 0.3–1.2)
Total Protein: 6.4 g/dL — ABNORMAL LOW (ref 6.5–8.1)

## 2020-01-22 LAB — TROPONIN I (HIGH SENSITIVITY): Troponin I (High Sensitivity): 212 ng/L (ref <18)

## 2020-01-22 MED ORDER — HEPARIN BOLUS VIA INFUSION
4000.0000 [IU] | Freq: Once | INTRAVENOUS | Status: AC
  Administered 2020-01-22: 4000 [IU] via INTRAVENOUS

## 2020-01-22 MED ORDER — HEPARIN (PORCINE) 25000 UT/250ML-% IV SOLN
1300.0000 [IU]/h | INTRAVENOUS | Status: DC
  Administered 2020-01-22: 1000 [IU]/h via INTRAVENOUS
  Administered 2020-01-23: 1300 [IU]/h via INTRAVENOUS
  Filled 2020-01-22 (×2): qty 250

## 2020-01-22 MED ORDER — ASPIRIN 81 MG PO CHEW
324.0000 mg | CHEWABLE_TABLET | Freq: Once | ORAL | Status: AC
  Administered 2020-01-22: 324 mg via ORAL
  Filled 2020-01-22: qty 4

## 2020-01-22 NOTE — ED Notes (Signed)
Pt on monitor 

## 2020-01-22 NOTE — Progress Notes (Signed)
ANTICOAGULATION CONSULT NOTE - Initial Consult  Pharmacy Consult for heparin Indication: chest pain/ACS  Allergies  Allergen Reactions  . Liraglutide Nausea And Vomiting  . Pioglitazone Swelling  . Amoxicillin-Pot Clavulanate Nausea And Vomiting    Did it involve swelling of the face/tongue/throat, SOB, or low BP? No Did it involve sudden or severe rash/hives, skin peeling, or any reaction on the inside of your mouth or nose? No Did you need to seek medical attention at a hospital or doctor's office? No When did it last happen?less than 10 years If all above answers are "NO", may proceed with cephalosporin use.     Patient Measurements: Height: 5\' 3"  (160 cm) Weight: 260 lb (117.9 kg) IBW/kg (Calculated) : 52.4 Heparin Dosing Weight: 81.2kg  Vital Signs: Temp: 97.9 F (36.6 C) (03/30 2214) Temp Source: Oral (03/30 2214) BP: 119/67 (03/30 2214) Pulse Rate: 74 (03/30 2214)  Labs: Recent Labs    01/22/20 2216  TROPONINIHS 212*    CrCl cannot be calculated (Patient's most recent lab result is older than the maximum 21 days allowed.).   Medical History: Past Medical History:  Diagnosis Date  . Asthma   . Collagen vascular disease (HCC)   . COPD (chronic obstructive pulmonary disease) (HCC)    O2 increased to 3L while ambulating, 2L at rest as of last week  . Diabetes mellitus without complication (HCC)   . High cholesterol   . History of leukocytosis    negative bone marrow, followed by heme q86mo  . Morbid obesity (HCC)    Assessment: Kelli Rivera presenting with CP, elevated trops.  Not on anticoagulation PTA.    Goal of Therapy:  Heparin level 0.3-0.7 units/ml Monitor platelets by anticoagulation protocol: Yes   Plan:  Heparin 4000 units IV x 1, and gtt at 1000 units/hr F/u 8 hour heparin level  11mo, PharmD Clinical Pharmacist ED Pharmacist Phone # [phone] 01/22/2020 11:23 PM

## 2020-01-22 NOTE — ED Provider Notes (Signed)
  Physical Exam  BP 119/67 (BP Location: Right Arm)   Pulse 74   Temp 97.9 F (36.6 C) (Oral)   Resp 20   Ht 5\' 3"  (1.6 m)   Wt 117.9 kg   SpO2 97%   BMI 46.06 kg/m   Physical Exam  ED Course/Procedures     Procedures  MDM    Assuming care of patient from Dr. .   Patient in the ED for chest pain. Currently chest pain free. Workup thus far shows elevated hs-tn. EKG has no acute changes.  Concerning findings are as following : elevated hs-tn. HEAR score is 6. Important pending results are all labs.  According to Dr. Jacqulyn Bath, plan is to transfer patient to [facility] ED if patient doesn't get a bed soon at [facility].   Patient had no complains, no concerns from the nursing side. Will continue to monitor.        [facility], MD 01/22/20 [phone]

## 2020-01-22 NOTE — ED Triage Notes (Signed)
Chest pain at 1950 tonight in grocery store. Pt went to [facility] then came here. States did not see an MD. Left sided chest pain. Aching. No SOB noted. Denies n/v.

## 2020-01-22 NOTE — ED Notes (Signed)
Date and time results received: 01/22/20 2317 (use smartphrase ".now" to insert current time)  Test: troponin Critical Value: 212  Name of Provider Notified: Dr. Jacqulyn Bath  Orders Received? Or Actions Taken?: no new orders

## 2020-01-22 NOTE — ED Provider Notes (Signed)
Emergency Department Provider Note   I have reviewed the triage vital signs and the nursing notes.   HISTORY  Chief Complaint No chief complaint on file.   HPI Kelli Rivera is a 69 y.o. female with PMH of asthma, COPD, DM, HLD, and elevated BMI presents to the emergency department with acute onset discomfort in her chest with diaphoresis.  Patient was shopping this evening at around 7:30 PM she developed symptoms.  She felt lightheaded but did not pass out.  She describes a feeling of abnormal heartbeats with some associated pressure.  She went out to the car to take a rest and continued to have symptoms so was initially transported to [facility].  She walked into the emergency department and symptoms seem to have resolved.  The weight was very Advaith Lamarque and so she left and came here for evaluation.  She is not having any symptoms at this time.  She has seen a cardiologist in the distant past but does not follow with anyone regularly.  No fever, chills, URI symptoms.   Past Medical History:  Diagnosis Date  . Asthma   . Collagen vascular disease ([place])   . COPD (chronic obstructive pulmonary disease) (HCC)    O2 increased to 3L while ambulating, 2L at rest as of last week  . Diabetes mellitus without complication ([place])   . High cholesterol   . History of leukocytosis    negative bone marrow, followed by heme q59mo  . Morbid obesity [facility])     Patient Active Problem List   Diagnosis Date Noted  . NSTEMI (non-ST elevated myocardial infarction) ([place]) 01/22/2020  . Acute respiratory failure with hypoxia ([place]) 01/01/2019  . Chronic diastolic CHF (congestive heart failure) ([place]) 01/01/2019  . Mild renal insufficiency 01/01/2019  . Chronic dermatitis 07/06/2018  . Venous insufficiency of both lower extremities 07/06/2018  . Palmar erythema 07/06/2018  . RA (rheumatoid arthritis) ([place]) 07/06/2018  . DJD (degenerative joint disease) 07/06/2018  . OSA (obstructive sleep apnea)  07/06/2018  . Eosinophilia 07/06/2018  . GERD (gastroesophageal reflux disease) 09/24/2017  . Iron deficiency 09/24/2017  . Leukocytosis 09/23/2016  . Morbid obesity ([place]) 09/23/2016  . COPD with acute exacerbation ([place]) 09/23/2016  . Hyperlipidemia 09/23/2016  . Diabetes mellitus type 2 in obese ([place]) 09/23/2016    Past Surgical History:  Procedure Laterality Date  . ABDOMINAL HYSTERECTOMY    . APPENDECTOMY     age 69 yrs  . bladder tuck    . CHOLECYSTECTOMY      Allergies Liraglutide, Pioglitazone, and Amoxicillin-pot clavulanate  Family History  Problem Relation Age of Onset  . Heart failure Mother 35  . Lung cancer Father 71    Social History Social History   Tobacco Use  . Smoking status: Former Smoker    Packs/day: 1.00    Years: 42.00    Pack years: 42.00    Quit date: 2007    Years since quitting: 14.2  . Smokeless tobacco: Never Used  Substance Use Topics  . Alcohol use: No  . Drug use: No    Review of Systems  Constitutional: No fever/chills Eyes: No visual changes. ENT: No sore throat. Cardiovascular: Positive chest pain and diaphoresis.  Respiratory: Denies shortness of breath. Gastrointestinal: No abdominal pain.  No nausea, no vomiting.  No diarrhea.  No constipation. Genitourinary: Negative for dysuria. Musculoskeletal: Negative for back pain. Skin: Negative for rash. Neurological: Negative for headaches, focal weakness or numbness.  10-point ROS otherwise negative.  ____________________________________________   PHYSICAL EXAM:  VITAL SIGNS: ED Triage Vitals  Enc Vitals Group     BP 01/22/20 2214 119/67     Pulse Rate 01/22/20 2214 74     Resp 01/22/20 2214 20     Temp 01/22/20 2214 97.9 F (36.6 C)     Temp Source 01/22/20 2214 Oral     SpO2 01/22/20 2214 97 %     Weight 01/22/20 2210 260 lb (117.9 kg)     Height 01/22/20 2210 5\' 3"  (1.6 m)   Constitutional: Alert and oriented. Well appearing and in no acute  distress. Eyes: Conjunctivae are normal.  Head: Atraumatic. Nose: No congestion/rhinnorhea. Mouth/Throat: Mucous membranes are moist.  Neck: No stridor.   Cardiovascular: Normal rate, regular rhythm. Good peripheral circulation. Grossly normal heart sounds.   Respiratory: Normal respiratory effort.  No retractions. Lungs CTAB. Gastrointestinal: Soft and nontender. No distention.  Musculoskeletal: No lower extremity tenderness with trace bilateral LE edema. No gross deformities of extremities. Neurologic:  Normal speech and language. No gross focal neurologic deficits are appreciated.  Skin:  Skin is warm, dry and intact. No rash noted.  ____________________________________________   LABS (all labs ordered are listed, but only abnormal results are displayed)  Labs Reviewed  TROPONIN I (HIGH SENSITIVITY) - Abnormal; Notable for the following components:      Result Value   Troponin I (High Sensitivity) 212 (*)    All other components within normal limits  RESPIRATORY PANEL BY RT PCR (FLU A&B, COVID)  CBC WITH DIFFERENTIAL/PLATELET  HEPARIN LEVEL (UNFRACTIONATED)  CBC   ____________________________________________  EKG   EKG Interpretation  Date/Time:  Tuesday January 22 2020 22:12:42 EDT Ventricular Rate:  73 PR Interval:    QRS Duration: 97 QT Interval:  464 QTC Calculation: 512 R Axis:   -18 Text Interpretation: Sinus rhythm Borderline left axis deviation Nonspecific T abnormalities, lateral leads Prolonged QT interval No STEMI Confirmed by 01-18-1990 [id number]) on 01/22/2020 10:14:20 PM       ____________________________________________  RADIOLOGY  DG Chest Portable 1 View  Result Date: 01/22/2020 CLINICAL DATA:  Chest pain for several hours EXAM: PORTABLE CHEST 1 VIEW COMPARISON:  Film from earlier in the same day. FINDINGS: Cardiac shadow is within normal limits. Aortic calcifications are again seen. The lungs are clear. Previously seen left basilar atelectasis is  not well appreciated. No bony abnormality is noted. IMPRESSION: No acute abnormality seen. Electronically Signed   By: 01/24/2020 M.D.   On: 01/22/2020 22:49    ____________________________________________   PROCEDURES  Procedure(s) performed:   Procedures  CRITICAL CARE Performed by: 01/24/2020 Total critical care time: 35 minutes Critical care time was exclusive of separately billable procedures and treating other patients. Critical care was necessary to treat or prevent imminent or life-threatening deterioration. Critical care was time spent personally by me on the following activities: development of treatment plan with patient and/or surrogate as well as nursing, discussions with consultants, evaluation of patient's response to treatment, examination of patient, obtaining history from patient or surrogate, ordering and performing treatments and interventions, ordering and review of laboratory studies, ordering and review of radiographic studies, pulse oximetry and re-evaluation of patient's condition.  Maia Plan, MD Emergency Medicine ____________________________________________   INITIAL IMPRESSION / ASSESSMENT AND PLAN / ED COURSE  Pertinent labs & imaging results that were available during my care of the patient were reviewed by me and considered in my medical decision making (see chart for details).   Patient  presents to the emergency department for evaluation of acute onset chest pressure/palpitations with diaphoresis.  Symptoms have resolved.  Vitals are within normal limits.  Patient has several ACS risk factors with a HEART score of 6.  EKG shows no evidence of acute ischemia and is interpreted as above.  Patient with elevated troponin. Spoke with fellow on call who accepts the patient for admission. No beds at this time. Remaining labs reviewed. Will hold briefly in the ED but plan for ED-ED transfer if bed assignment is delayed.   Discussed patient's case with  Cardiology to request admission. Patient and family (if present) updated with plan. Care transferred to Cardiology service.  I reviewed all nursing notes, vitals, pertinent old records, EKGs, labs, imaging (as available).  ____________________________________________  FINAL CLINICAL IMPRESSION(S) / ED DIAGNOSES  Final diagnoses:  NSTEMI (non-ST elevated myocardial infarction) (HCC)     MEDICATIONS GIVEN DURING THIS VISIT:  Medications  heparin bolus via infusion 4,000 Units (has no administration in time range)  heparin ADULT infusion 100 units/mL (25000 units/276mL sodium chloride 0.45%) (has no administration in time range)  aspirin chewable tablet 324 mg (324 mg Oral Given 01/22/20 2243)    Note:  This document was prepared using Dragon voice recognition software and may include unintentional dictation errors.  Alona Bene, MD, [facility] Emergency Medicine    Hamna Asa, Arlyss Repress, MD 01/23/20 1247

## 2020-01-22 NOTE — Progress Notes (Signed)
Patient has been placed on a 2 liter nasal cannula QHS per her stated home regimen

## 2020-01-22 NOTE — ED Notes (Signed)
Pt. Went into grocery store tonight and felt a sinking feeling and her heart skipping beats she became sweaty with no nausea.  Pt. Said she just did not feel right.  Pt. Reports now just feels real tired.

## 2020-01-23 ENCOUNTER — Encounter (HOSPITAL_COMMUNITY): Payer: Self-pay | Admitting: Cardiovascular Disease

## 2020-01-23 ENCOUNTER — Other Ambulatory Visit: Payer: Self-pay

## 2020-01-23 ENCOUNTER — Inpatient Hospital Stay (HOSPITAL_COMMUNITY): Payer: Medicare Other

## 2020-01-23 DIAGNOSIS — E119 Type 2 diabetes mellitus without complications: Secondary | ICD-10-CM

## 2020-01-23 DIAGNOSIS — Z794 Long term (current) use of insulin: Secondary | ICD-10-CM

## 2020-01-23 DIAGNOSIS — I2510 Atherosclerotic heart disease of native coronary artery without angina pectoris: Secondary | ICD-10-CM

## 2020-01-23 DIAGNOSIS — R079 Chest pain, unspecified: Secondary | ICD-10-CM

## 2020-01-23 DIAGNOSIS — E785 Hyperlipidemia, unspecified: Secondary | ICD-10-CM

## 2020-01-23 DIAGNOSIS — I214 Non-ST elevation (NSTEMI) myocardial infarction: Secondary | ICD-10-CM

## 2020-01-23 LAB — GLUCOSE, CAPILLARY
Glucose-Capillary: 99 mg/dL (ref 70–99)
Glucose-Capillary: 88 mg/dL (ref 70–99)
Glucose-Capillary: 118 mg/dL — ABNORMAL HIGH (ref 70–99)
Glucose-Capillary: 118 mg/dL — ABNORMAL HIGH (ref 70–99)

## 2020-01-23 LAB — LIPID PANEL
Cholesterol: 147 mg/dL (ref 0–200)
HDL: 28 mg/dL — ABNORMAL LOW (ref >40)
LDL Cholesterol: 86 mg/dL (ref 0–99)
Total CHOL/HDL Ratio: 5.3 RATIO
Triglycerides: 166 mg/dL — ABNORMAL HIGH (ref <150)
VLDL: 33 mg/dL (ref 0–40)

## 2020-01-23 LAB — BRAIN NATRIURETIC PEPTIDE: B Natriuretic Peptide: 12.7 pg/mL (ref 0.0–100.0)

## 2020-01-23 LAB — RESPIRATORY PANEL BY RT PCR (FLU A&B, COVID)
Influenza A by PCR: NEGATIVE
Influenza B by PCR: NEGATIVE
SARS Coronavirus 2 by RT PCR: NEGATIVE

## 2020-01-23 LAB — HEMOGLOBIN A1C
Hgb A1c MFr Bld: 7 % — ABNORMAL HIGH (ref 4.8–5.6)
Mean Plasma Glucose: 154.2 mg/dL

## 2020-01-23 LAB — HEPARIN LEVEL (UNFRACTIONATED)
Heparin Unfractionated: 0.18 IU/mL — ABNORMAL LOW (ref 0.30–0.70)
Heparin Unfractionated: 0.3 IU/mL (ref 0.30–0.70)

## 2020-01-23 LAB — ECHOCARDIOGRAM COMPLETE
Height: 63 in
Weight: 4203.2 oz

## 2020-01-23 LAB — TROPONIN I (HIGH SENSITIVITY)
Troponin I (High Sensitivity): 3 ng/L (ref <18)
Troponin I (High Sensitivity): 3 ng/L (ref <18)
Troponin I (High Sensitivity): 3 ng/L (ref <18)

## 2020-01-23 LAB — HIV ANTIBODY (ROUTINE TESTING W REFLEX): HIV Screen 4th Generation wRfx: NONREACTIVE

## 2020-01-23 MED ORDER — ACETAMINOPHEN 325 MG PO TABS: 650.0000 mg | ORAL_TABLET | ORAL | Status: DC | PRN

## 2020-01-23 MED ORDER — IOHEXOL 350 MG/ML SOLN
90.0000 mL | Freq: Once | INTRAVENOUS | Status: AC | PRN
  Administered 2020-01-23: 90 mL via INTRAVENOUS

## 2020-01-23 MED ORDER — NITROGLYCERIN 0.4 MG SL SUBL: 0.4000 mg | SUBLINGUAL_TABLET | SUBLINGUAL | Status: DC | PRN

## 2020-01-23 MED ORDER — INSULIN ASPART 100 UNIT/ML ~~LOC~~ SOLN
0.0000 [IU] | Freq: Three times a day (TID) | SUBCUTANEOUS | Status: DC
  Administered 2020-01-24: 2 [IU] via SUBCUTANEOUS

## 2020-01-23 MED ORDER — ATORVASTATIN CALCIUM 80 MG PO TABS
80.0000 mg | ORAL_TABLET | Freq: Every day | ORAL | Status: DC
  Administered 2020-01-23: 80 mg via ORAL
  Filled 2020-01-23: qty 1

## 2020-01-23 MED ORDER — ASPIRIN EC 81 MG PO TBEC
81.0000 mg | DELAYED_RELEASE_TABLET | Freq: Every day | ORAL | Status: DC
  Administered 2020-01-24: 81 mg via ORAL
  Filled 2020-01-23: qty 1

## 2020-01-23 MED ORDER — INSULIN ASPART 100 UNIT/ML ~~LOC~~ SOLN: 0.0000 [IU] | Freq: Every day | SUBCUTANEOUS | Status: DC

## 2020-01-23 MED ORDER — NITROGLYCERIN 0.4 MG SL SUBL
SUBLINGUAL_TABLET | SUBLINGUAL | Status: AC
  Administered 2020-01-23: 0.8 mg via SUBLINGUAL
  Filled 2020-01-23: qty 2

## 2020-01-23 MED ORDER — ONDANSETRON HCL 4 MG/2ML IJ SOLN: 4.0000 mg | Freq: Four times a day (QID) | INTRAMUSCULAR | Status: DC | PRN

## 2020-01-23 NOTE — Progress Notes (Signed)
ANTICOAGULATION CONSULT NOTE  Pharmacy Consult for heparin Indication: chest pain/ACS  Allergies  Allergen Reactions  . Liraglutide Nausea And Vomiting  . Pioglitazone Swelling  . Amoxicillin-Pot Clavulanate Nausea And Vomiting    Did it involve swelling of the face/tongue/throat, SOB, or low BP? No Did it involve sudden or severe rash/hives, skin peeling, or any reaction on the inside of your mouth or nose? No Did you need to seek medical attention at a hospital or doctor's office? No When did it last happen?less than 10 years If all above answers are "NO", may proceed with cephalosporin use.     Patient Measurements: Height: 5\' 3"  (160 cm) Weight: 262 lb 11.2 oz (119.2 kg) IBW/kg (Calculated) : 52.4 Heparin Dosing Weight: 81.2kg  Vital Signs: Temp: 98 F (36.7 C) (03/31 0812) Temp Source: Oral (03/31 0812) BP: 118/77 (03/31 0812) Pulse Rate: 63 (03/31 0812)  Labs: Recent Labs    01/22/20 2216 01/22/20 2305 01/23/20 0030 01/23/20 0341 01/23/20 0656  HGB  --  12.7  --   --   --   HCT  --  42.7  --   --   --   PLT  --  205  --   --   --   HEPARINUNFRC  --   --   --   --  0.18*  CREATININE  --  0.67  --   --   --   TROPONINIHS   < >  --  3 3 3    < > = values in this interval not displayed.    Estimated Creatinine Clearance: 81.7 mL/min (by C-G formula based on SCr of 0.67 mg/dL).   Medical History: Past Medical History:  Diagnosis Date  . Asthma   . Collagen vascular disease (HCC)   . COPD (chronic obstructive pulmonary disease) (HCC)    O2 increased to 3L while ambulating, 2L at rest as of last week  . Diabetes mellitus without complication (HCC)   . High cholesterol   . History of leukocytosis    negative bone marrow, followed by heme q50mo  . Morbid obesity (HCC)    Assessment: 69 YOF presenting with CP, elevated trops, started on IV heparin.  Heparin level subtherapeutic at 0.18, CBC not collected, trops normalized.  Goal of Therapy:   Heparin level 0.3-0.7 units/ml Monitor platelets by anticoagulation protocol: Yes   Plan:  Increase heparin to 1300 units/h Recheck heparin level in 8hr   , PharmD, BCPS Clinical Pharmacist [phone] Please check AMION for all [facility] Pharmacy numbers 01/23/2020

## 2020-01-23 NOTE — Progress Notes (Signed)
ANTICOAGULATION CONSULT NOTE  Pharmacy Consult for heparin Indication: chest pain/ACS  Allergies  Allergen Reactions  . Liraglutide Nausea And Vomiting  . Pioglitazone Swelling  . Amoxicillin-Pot Clavulanate Nausea And Vomiting    Did it involve swelling of the face/tongue/throat, SOB, or low BP? No Did it involve sudden or severe rash/hives, skin peeling, or any reaction on the inside of your mouth or nose? No Did you need to seek medical attention at a hospital or doctor's office? No When did it last happen?less than 10 years If all above answers are "NO", may proceed with cephalosporin use.     Patient Measurements: Height: 5\' 3"  (160 cm) Weight: 262 lb 11.2 oz (119.2 kg) IBW/kg (Calculated) : 52.4 Heparin Dosing Weight: 81.2 kg  Vital Signs: Temp: 98.1 F (36.7 C) (03/31 1140) Temp Source: Oral (03/31 1140) BP: 128/58 (03/31 1140) Pulse Rate: 61 (03/31 1140)  Labs: Recent Labs    01/22/20 2216 01/22/20 2305 01/23/20 0030 01/23/20 0341 01/23/20 0656 01/23/20 1906  HGB  --  12.7  --   --   --   --   HCT  --  42.7  --   --   --   --   PLT  --  205  --   --   --   --   HEPARINUNFRC  --   --   --   --  0.18* 0.30  CREATININE  --  0.67  --   --   --   --   TROPONINIHS   < >  --  3 3 3   --    < > = values in this interval not displayed.    Estimated Creatinine Clearance: 81.7 mL/min (by C-G formula based on SCr of 0.67 mg/dL).   Medical History: Past Medical History:  Diagnosis Date  . Asthma   . Collagen vascular disease (HCC)   . COPD (chronic obstructive pulmonary disease) (HCC)    O2 increased to 3L while ambulating, 2L at rest as of last week  . Diabetes mellitus without complication (HCC)   . Feeling of chest tightness 12/2019  . High cholesterol   . History of leukocytosis    negative bone marrow, followed by heme q44mo  . Morbid obesity [facility])    Assessment: 69 yr old female presented with CP, elevated initial troponin, and was started on IV  heparin.    Heparin level ~9.5 hrs after heparin infusion was increased to 1300 units/hr was 0.30 units/ml, which is at the low end of the goal range for this pt. CBC not done today; troponins have normalized. Pt had echo today; EF 40-45%.  Goal of Therapy:  Heparin level 0.3-0.7 units/ml Monitor platelets by anticoagulation protocol: Yes   Plan:  Continue heparin infusion at 1300 units/hr Check confirmatory heparin level in 8 hrs Monitor daily heparin level, CBC Monitor for signs/symptoms of bleeding  [facility], PharmD, BCPS, [facility] Clinical Pharmacist 01/23/2020

## 2020-01-23 NOTE — H&P (Signed)
Cardiology Admission History and Physical:   Patient ID: Kelli Rivera; MRN: [id number]; DOB: 12-04-1950   Admission date: 01/22/2020  Primary Care Provider: Angelica Chessman, MD Primary Cardiologist: No primary care provider on file.    Chief Complaint:  Chest pain  History of Present Illness:   Kelli Rivera is a 69 y.o. female with a history of COPD, type 2 diabetes mellitus, hyperlipidemia, and morbid obesity who presented to the hospital after a single episode of chest tightness while she was shopping at a grocery store.  The patient states that she developed substernal chest pain as she was exerting herself.  The sensation lasted for less than 5 minutes.  There was no radiation of the chest pain.  She had some associated diaphoresis.  She denied any nausea, vomiting or dyspnea.  For the past few weeks she has noticed similar discomfort on exertion.  Greater than 5 years ago she recalls having had a stress test.  At that time her cardiac work-up was negative.  Currently she denies any orthopnea, paroxysmal nocturnal dyspnea or lower extremity edema.  She has not had any fevers or chills.  In the hospital her blood pressure is 119/67 mmHg with a heart rate of 74 bpm.  Her oxygen saturation is 97%.  The electrocardiogram revealed nonspecific ST-T wave abnormalities in the lateral leads.  There was also a prolonged QT interval.  The high-sensitivity troponins were 212 and 3.  Her labs were as follows: Potassium 3.7, creatinine 0.67, WBC 12.4, hemoglobin 12.7, hematocrit 42.7 and platelets 205.  The chest x-ray was unremarkable.  Previous cardiac testing Echocardiogram 09/28/2017 Study Conclusions  - Left ventricle: The cavity size was normal. Wall thickness was  normal. Systolic function was normal. The estimated ejection  fraction was in the range of 55% to 60%. Wall motion was normal;  there were no regional wall motion abnormalities. Doppler  parameters are consistent with  abnormal left ventricular  relaxation (grade 1 diastolic dysfunction). The E/e&' ratio is  between 8-15, suggesting indeterminate LV filling pressure.  - Mitral valve: Mildly thickened leaflets . There was trivial  regurgitation.  - Left atrium: The atrium was normal in size.  - Right atrium: The atrium was mildly dilated.  - Atrial septum: Possible small mobile mass on the left atrial side  of the septum - could be artifact, myxoma or possibly septal  aneurysm, not well visualized and not seen in the 4 chamber view  - consider repeat outpatient TTE or TEE to further evaluate.  - Inferior vena cava: The vessel was dilated. The respirophasic  diameter changes were blunted (< 50%), consistent with elevated  central venous pressure.  Impressions:  - LVEF 55-60%, normal wall thickness, normal wall motion, grade 1  DD, indeterminate LV filling pressure, trivial MR, normal LA  size, mild RAE, possible small mobile mass adherent to the atrial  septum in the LA - consider further imaging if clinically  indicated., dilated IVC.    Past Medical History:  Diagnosis Date  . Asthma   . Collagen vascular disease (HCC)   . COPD (chronic obstructive pulmonary disease) (HCC)    O2 increased to 3L while ambulating, 2L at rest as of last week  . Diabetes mellitus without complication (HCC)   . High cholesterol   . History of leukocytosis    negative bone marrow, followed by heme q17mo  . Morbid obesity (HCC)     Past Surgical History:  Procedure Laterality Date  . ABDOMINAL  HYSTERECTOMY    . APPENDECTOMY     age 63 yrs  . bladder tuck    . CHOLECYSTECTOMY       Medications Prior to Admission: Prior to Admission medications   Medication Sig Start Date End Date Taking? Authorizing Provider  albuterol (PROVENTIL HFA;VENTOLIN HFA) 108 (90 Base) MCG/ACT inhaler Inhale 2 puffs into the lungs every 4 (four) hours as needed for wheezing or shortness of breath. 12/17/15    [provider]  aspirin EC 81 MG tablet Take 81 mg by mouth daily.    [provider]  chlorpheniramine-HYDROcodone (TUSSIONEX) 10-8 MG/5ML SUER Take 5 mLs by mouth every 12 (twelve) hours as needed for cough. 01/10/19   Calvert Cantor, MD  fluticasone (FLONASE) 50 MCG/ACT nasal spray Place 2 sprays into both nostrils daily as needed for allergies. 10/11/18   [provider]  Fluticasone-Salmeterol (ADVAIR DISKUS) 500-50 MCG/DOSE AEPB Inhale 1 puff into the lungs 2 (two) times daily.     [provider]  furosemide (LASIX) 40 MG tablet Take 1 tablet (40 mg total) by mouth daily. 10/01/17   Ghimire, Werner Lean, MD  insulin aspart (NOVOLOG FLEXPEN) 100 UNIT/ML FlexPen Inject 1-15 Units into the skin 3 (three) times daily with meals. CBG 121 - 150: 2 units  CBG 151 - 200: 3 units  CBG 201 - 250: 5 units  CBG 251 - 300: 8 units  CBG 301 - 350: 11 units  CBG 351 - 400: 15 units 01/10/19   Rizwan, Ladell Heads, MD  ipratropium-albuterol (DUONEB) 0.5-2.5 (3) MG/3ML SOLN Take 3 mLs by nebulization 4 (four) times daily. Patient taking differently: Take 3 mLs by nebulization 4 (four) times daily as needed (shortness of breath).  10/01/17   Ghimire, Werner Lean, MD  metFORMIN (GLUCOPHAGE) 1000 MG tablet Take 1,000 mg by mouth daily with breakfast.     [provider]  mometasone (ELOCON) 0.1 % cream Apply 1 application topically 2 (two) times daily as needed (irritation).  04/12/18   [provider]  montelukast (SINGULAIR) 10 MG tablet Take 10 mg by mouth every morning.  08/18/15   [provider]  Multiple Vitamins-Minerals (CENTRUM SILVER 50+WOMEN PO) Take 1 tablet by mouth daily.    [provider]  omeprazole (PRILOSEC) 40 MG capsule Take 40 mg by mouth every morning.  08/31/16   [provider]  oxybutynin (DITROPAN XL) 15 MG 24 hr tablet Take 15 mg by mouth daily. 06/01/18   [provider]  predniSONE (DELTASONE) 20 MG tablet  Take 2 tablets (40 mg total) by mouth daily. 01/10/19   Calvert Cantor, MD  TOUJEO SOLOSTAR 300 UNIT/ML SOPN Inject 30 Units into the skin every morning.  08/31/16   [provider]  TRULICITY 1.5 MG/0.5ML SOPN Inject 1.5 mg into the skin every Thursday. 09/19/17   [provider]     Allergies:    Allergies  Allergen Reactions  . Liraglutide Nausea And Vomiting  . Pioglitazone Swelling  . Amoxicillin-Pot Clavulanate Nausea And Vomiting    Did it involve swelling of the face/tongue/throat, SOB, or low BP? No Did it involve sudden or severe rash/hives, skin peeling, or any reaction on the inside of your mouth or nose? No Did you need to seek medical attention at a hospital or doctor's office? No When did it last happen?less than 10 years If all above answers are "NO", may proceed with cephalosporin use.     Social History:   Social History  Socioeconomic History  . Marital status: Divorced    Spouse name: Not on file  . Number of children: Not on file  . Years of education: Not on file  . Highest education level: Not on file  Occupational History  . Not on file  Tobacco Use  . Smoking status: Former Smoker    Packs/day: 1.00    Years: 42.00    Pack years: 42.00    Quit date: 2007    Years since quitting: 14.2  . Smokeless tobacco: Never Used  Substance and Sexual Activity  . Alcohol use: No  . Drug use: No  . Sexual activity: Not on file  Other Topics Concern  . Not on file  Social History Narrative  . Not on file   Social Determinants of Health   Financial Resource Strain:   . Difficulty of Paying Living Expenses:   Food Insecurity:   . Worried About Programme researcher, broadcasting/film/video in the Last Year:   . Barista in the Last Year:   Transportation Needs:   . Freight forwarder (Medical):   Marland Kitchen Lack of Transportation (Non-Medical):   Physical Activity:   . Days of Exercise per Week:   . Minutes of Exercise per Session:   Stress:   .  Feeling of Stress :   Social Connections:   . Frequency of Communication with Friends and Family:   . Frequency of Social Gatherings with Friends and Family:   . Attends Religious Services:   . Active Member of Clubs or Organizations:   . Attends Banker Meetings:   Marland Kitchen Marital Status:   Intimate Partner Violence:   . Fear of Current or Ex-Partner:   . Emotionally Abused:   Marland Kitchen Physically Abused:   . Sexually Abused:      Family History:   The patient's family history includes Heart failure (age of onset: 50) in her mother; Lung cancer (age of onset: 53) in her father.     Review of Systems: [y] = yes, [ ]  = no   . General: Weight gain [ ] ; Weight loss [ ] ; Anorexia [ ] ; Fatigue [ ] ; Fever [ ] ; Chills [ ] ; Weakness [ ]   . Cardiac: Chest pain/pressure [Y]; Resting SOB [ ] ; Exertional SOB [ ] ; Orthopnea [ ] ; Pedal Edema [ ] ; Palpitations [ ] ; Syncope [ ] ; Presyncope [ ] ; Paroxysmal nocturnal dyspnea[ ]   . Pulmonary: Cough [ ] ; Wheezing[ ] ; Hemoptysis[ ] ; Sputum [ ] ; Snoring [ ]   . GI: Vomiting[ ] ; Dysphagia[ ] ; Melena[ ] ; Hematochezia [ ] ; Heartburn[ ] ; Abdominal pain [ ] ; Constipation [ ] ; Diarrhea [ ] ; BRBPR [ ]   . GU: Hematuria[ ] ; Dysuria [ ] ; Nocturia[ ]   . Vascular: Pain in legs with walking [ ] ; Pain in feet with lying flat [ ] ; Non-healing sores [ ] ; Stroke [ ] ; TIA [ ] ; Slurred speech [ ] ;  . Neuro: Headaches[ ] ; Vertigo[ ] ; Seizures[ ] ; Paresthesias[ ] ;Blurred vision [ ] ; Diplopia [ ] ; Vision changes [ ]   . Ortho/Skin: Arthritis [ ] ; Joint pain [ ] ; Muscle pain [ ] ; Joint swelling [ ] ; Back Pain [ ] ; Rash [ ]   . Psych: Depression[ ] ; Anxiety[ ]   . Heme: Bleeding problems [ ] ; Clotting disorders [ ] ; Anemia [ ]   . Endocrine: Diabetes [ ] ; Thyroid dysfunction[ ]      Physical Exam/Data:   Vitals:   01/22/20 2214 01/22/20 2355 01/23/20 0030 01/23/20 0139  BP: 119/67 (!) 125/59  122/60 128/65  Pulse: 74 68 73 79  Resp: 20 16 (!) 21 20  Temp: 97.9 F (36.6 C)    98.2 F (36.8 C)  TempSrc: Oral   Oral  SpO2: 97% 100% 100% 100%  Weight:    119.2 kg  Height:    5\' 3"  (1.6 m)   No intake or output data in the 24 hours ending 01/23/20 0253 Filed Weights   01/22/20 2210 01/23/20 0139  Weight: 117.9 kg 119.2 kg   Body mass index is 46.54 kg/m.  General:  Well nourished, well developed, in no acute distress HEENT: normal Lymph: no adenopathy Neck: no JVD Endocrine:  No thryomegaly Vascular: No carotid bruits; FA pulses 2+ bilaterally without bruits  Cardiac:  normal S1, S2; RRR; no murmur  Lungs:  clear to auscultation bilaterally, no wheezing, rhonchi or rales  Abd: soft, nontender, no hepatomegaly  Ext: no edema Musculoskeletal:  No deformities, BUE and BLE strength normal and equal Skin: warm and dry  Neuro:  CNs 2-12 intact, no focal abnormalities noted Psych:  Normal affect    Laboratory Data:  Chemistry Recent Labs  Lab 01/22/20 2305  NA 139  K 3.7  CL 105  CO2 27  GLUCOSE 106*  BUN 16  CREATININE 0.67  CALCIUM 8.6*  GFRNONAA >60  GFRAA >60  ANIONGAP 7    Recent Labs  Lab 01/22/20 2305  PROT 6.4*  ALBUMIN 3.4*  AST 12*  ALT 10  ALKPHOS 61  BILITOT 0.7   Hematology Recent Labs  Lab 01/22/20 2305  WBC 12.4*  RBC 5.23*  HGB 12.7  HCT 42.7  MCV 81.6  MCH 24.3*  MCHC 29.7*  RDW 16.8*  PLT 205   Cardiac EnzymesNo results for input(s): TROPONINI in the last 168 hours. No results for input(s): TROPIPOC in the last 168 hours.  BNPNo results for input(s): BNP, PROBNP in the last 168 hours.  DDimer No results for input(s): DDIMER in the last 168 hours.  Radiology/Studies:  DG Chest Portable 1 View  Result Date: 01/22/2020 CLINICAL DATA:  Chest pain for several hours EXAM: PORTABLE CHEST 1 VIEW COMPARISON:  Film from earlier in the same day. FINDINGS: Cardiac shadow is within normal limits. Aortic calcifications are again seen. The lungs are clear. Previously seen left basilar atelectasis is not well  appreciated. No bony abnormality is noted. IMPRESSION: No acute abnormality seen. Electronically Signed   By: 01/24/2020 M.D.   On: 01/22/2020 22:49    Assessment and Plan:   1. Non-ST elevation myocardial infarction The patient presents to the hospital after developing substernal chest pain yesterday while out at a grocery store.  She does have multiple risk factors for coronary artery disease such as diabetes mellitus, former smoking and obesity.  The initial high-sensitivity troponin was abnormal at 212.  The repeat value however was normal.  Her ECG reveals nonspecific ST-T wave abnormalities in the lateral leads.  -Monitor on telemetry -Aspirin 81 mg daily -High-dose statins -Check a lipid panel -Nitroglycerin as needed for chest pain -Unfractionated IV heparin infusion -Transthoracic echocardiogram to evaluate LV function -Keep NPO  In the morning will decide whether the patient should undergo a myocardial perfusion imaging test versus a cardiac catheterization.   Severity of Illness: The appropriate patient status for this patient is INPATIENT. Inpatient status is judged to be reasonable and necessary in order to provide the required intensity of service to ensure the patient's safety. The patient's presenting symptoms, physical exam findings, and  initial radiographic and laboratory data in the context of their chronic comorbidities is felt to place them at high risk for further clinical deterioration. Furthermore, it is not anticipated that the patient will be medically stable for discharge from the hospital within 2 midnights of admission. The following factors support the patient status of inpatient.   " The patient's presenting symptoms include chest pain . " The physical exam findings include normal cardiac auscultation. " The initial radiographic and laboratory data are worrisome because of abnormal ECG. " The chronic co-morbidities include diabetes mellitus and  hyperlipidemia.   * I certify that at the point of admission it is my clinical judgment that the patient will require inpatient hospital care spanning beyond 2 midnights from the point of admission due to high intensity of service, high risk for further deterioration and high frequency of surveillance required.*    For questions or updates, please contact [place] Please consult [email or web address] for contact info under Cardiology/STEMI.    Signed, Meade Maw, MD  01/23/2020 2:53 AM

## 2020-01-23 NOTE — Progress Notes (Signed)
Echocardiogram 2D Echocardiogram has been performed.  Kelli Rivera 01/23/2020, 11:06 AM

## 2020-01-23 NOTE — Progress Notes (Addendum)
Progress Note  Patient Name: Kelli Rivera Date of Encounter: 01/23/2020  Primary Cardiologist: No primary care provider on file.   Subjective   No chest pain this morning.   Inpatient Medications    Scheduled Meds: . [START ON 01/24/2020] aspirin EC  81 mg Oral Daily  . atorvastatin  80 mg Oral q1800  . insulin aspart  0-15 Units Subcutaneous TID WC  . insulin aspart  0-5 Units Subcutaneous QHS   Continuous Infusions: . heparin 1,000 Units/hr (01/22/20 2341)   PRN Meds: acetaminophen, nitroGLYCERIN, ondansetron (ZOFRAN) IV   Vital Signs    Vitals:   01/23/20 0030 01/23/20 0139 01/23/20 0610 01/23/20 0812  BP: 122/60 128/65 (!) 109/44 118/77  Pulse: 73 79 67 63  Resp: (!) 21 20 20 18   Temp:  98.2 F (36.8 C) 98 F (36.7 C) 98 F (36.7 C)  TempSrc:  Oral Oral Oral  SpO2: 100% 100% 98% 98%  Weight:  119.2 kg 119.2 kg   Height:  5\' 3"  (1.6 m)      Intake/Output Summary (Last 24 hours) at 01/23/2020 0913 Last data filed at 01/23/2020 0854 Gross per 24 hour  Intake 0 ml  Output --  Net 0 ml   Last 3 Weights 01/23/2020 01/23/2020 01/22/2020  Weight (lbs) 262 lb 11.2 oz 262 lb 11.2 oz 260 lb  Weight (kg) 119.16 kg 119.16 kg 117.935 kg      Telemetry    SR - Personally Reviewed  ECG    SR with nonspecific TW changes - Personally Reviewed  Physical Exam  Pleasant older AAF, laying in bed.  GEN: No acute distress.   Neck: No JVD Cardiac: RRR, no murmurs, rubs, or gallops.  Respiratory: Clear to auscultation bilaterally. GI: Soft, nontender, non-distended  MS: No edema; No deformity. Neuro:  Nonfocal  Psych: Normal affect   Labs    High Sensitivity Troponin:   Recent Labs  Lab 01/22/20 2216 01/23/20 0030 01/23/20 0341 01/23/20 0656  TROPONINIHS 212* 3 3 3       Chemistry Recent Labs  Lab 01/22/20 2305  NA 139  K 3.7  CL 105  CO2 27  GLUCOSE 106*  BUN 16  CREATININE 0.67  CALCIUM 8.6*  PROT 6.4*  ALBUMIN 3.4*  AST 12*  ALT 10   ALKPHOS 61  BILITOT 0.7  GFRNONAA >60  GFRAA >60  ANIONGAP 7     Hematology Recent Labs  Lab 01/22/20 2305  WBC 12.4*  RBC 5.23*  HGB 12.7  HCT 42.7  MCV 81.6  MCH 24.3*  MCHC 29.7*  RDW 16.8*  PLT 205    BNP Recent Labs  Lab 01/23/20 0341  BNP 12.7     DDimer No results for input(s): DDIMER in the last 168 hours.   Radiology    DG Chest Portable 1 View  Result Date: 01/22/2020 CLINICAL DATA:  Chest pain for several hours EXAM: PORTABLE CHEST 1 VIEW COMPARISON:  Film from earlier in the same day. FINDINGS: Cardiac shadow is within normal limits. Aortic calcifications are again seen. The lungs are clear. Previously seen left basilar atelectasis is not well appreciated. No bony abnormality is noted. IMPRESSION: No acute abnormality seen. Electronically Signed   By: Kelli Rivera M.D.   On: 01/22/2020 22:49    Cardiac Studies   N/a   Patient Profile     69 y.o. female with a history of COPD, type 2 diabetes mellitus, hyperlipidemia, and morbid obesity who presented to the hospital  after a single episode of chest tightness while she was shopping at a grocery store. Admitted for further management.   Assessment & Plan    1. Chest pain: initial hsTn was elevated at 212 but repeat were normal. States she has had several episodes of chest tightness while at rest over the past 2 weeks, but episode yesterday was the worst. EKG with nonspecific TW changes in lateral leads.  -- on ASA, added statin -- will review with MD regarding further imaging with stress vs cath. I reviewed the risk/benefit of possible cath -- remains on IV heparin -- echo pending  2. IDDM: on metformin, Toujeo and trulicityy prior to admission. Hgb A1c 7.0  3. HL: not on statin prior admission. LDL 86  4. COPD: stable For questions or updates, please contact [facility] Please consult [email or web address] for contact info under        Signed, Kelli Page, NP  01/23/2020, 9:13 AM    Patient  seen and examined. Agree with assessment and plan. Kelli Rivera is a 69 year old female who denies any clear-cut exertional precipitation of chest pain. However she has noticed a vague chest sensation over the past 2 weeks. Yesterday she experienced sensation where she felt her chest was caving in which lasted for 2 to 3 minutes and ultimately resolved and then had some residual sensation which persisted. Her initial high-sensitivity troponin was minimally increased at 212 but subsequent troponin levels have been 3 on several repeat evaluations. Her ECG does not show definitive ST changes although nondiagnostic T wave abnormalities are present. Risk factor includes diabetes mellitus. LDL cholesterol is 86 and has not been on prior statin therapy. I discussed diagnostic options with the patient including definitive diagnostic cardiac catheterization versus initial evaluation with coronary CTA. With her somewhat atypical discomfort, I have suggested that we initially proceed with coronary CTA imaging. We will try to schedule for today.   Kelli Bihari, MD, [facility] 01/23/2020 11:15 AM

## 2020-01-24 DIAGNOSIS — R931 Abnormal findings on diagnostic imaging of heart and coronary circulation: Secondary | ICD-10-CM

## 2020-01-24 LAB — CBC
HCT: 39.3 % (ref 36.0–46.0)
Hemoglobin: 11.4 g/dL — ABNORMAL LOW (ref 12.0–15.0)
MCH: 24 pg — ABNORMAL LOW (ref 26.0–34.0)
MCHC: 29 g/dL — ABNORMAL LOW (ref 30.0–36.0)
MCV: 82.7 fL (ref 80.0–100.0)
Platelets: 205 10*3/uL (ref 150–400)
RBC: 4.75 MIL/uL (ref 3.87–5.11)
RDW: 16.6 % — ABNORMAL HIGH (ref 11.5–15.5)
WBC: 11 10*3/uL — ABNORMAL HIGH (ref 4.0–10.5)
nRBC: 0 % (ref 0.0–0.2)

## 2020-01-24 LAB — HEPARIN LEVEL (UNFRACTIONATED): Heparin Unfractionated: 0.43 IU/mL (ref 0.30–0.70)

## 2020-01-24 LAB — GLUCOSE, CAPILLARY: Glucose-Capillary: 136 mg/dL — ABNORMAL HIGH (ref 70–99)

## 2020-01-24 MED ORDER — ATORVASTATIN CALCIUM 80 MG PO TABS: 80.0000 mg | ORAL_TABLET | Freq: Every day | ORAL | 1 refills | Status: DC

## 2020-01-24 NOTE — Plan of Care (Signed)
  Problem: Education: Goal: Knowledge of General Education information will improve Description Including pain rating scale, medication(s)/side effects and non-pharmacologic comfort measures Outcome: Progressing   

## 2020-01-24 NOTE — Discharge Summary (Addendum)
Discharge Summary    Patient ID: Kelli Rivera,  MRN: [id number], DOB/AGE: 04/17/1951 69 y.o.  Admit date: 01/22/2020 Discharge date: 01/24/2020  Primary Care Provider: Angelica Chessman Primary Cardiologist: Nicki Guadalajara, MD  Discharge Diagnoses    Principal Problem:   Chest pain Active Problems:   Type 2 diabetes mellitus without complication, with long-term current use of insulin (HCC)   Agatston coronary artery calcium score less than 100   Allergies Allergies  Allergen Reactions  . Liraglutide Nausea And Vomiting  . Pioglitazone Swelling  . Amoxicillin-Pot Clavulanate Nausea And Vomiting    Did it involve swelling of the face/tongue/throat, SOB, or low BP? No Did it involve sudden or severe rash/hives, skin peeling, or any reaction on the inside of your mouth or nose? No Did you need to seek medical attention at a hospital or doctor's office? No When did it last happen?less than 10 years If all above answers are "NO", may proceed with cephalosporin use.    Diagnostic Studies/Procedures    Echo: 01/23/20  IMPRESSIONS   1. Left ventricular ejection fraction, by estimation, is 40 to 45%. The  left ventricle has mildly decreased function. The left ventricle  demonstrates global hypokinesis. Left ventricular diastolic parameters are  indeterminate.  2. Right ventricular systolic function is normal. The right ventricular  size is normal.  3. The mitral valve is normal in structure. Trivial mitral valve  regurgitation. No evidence of mitral stenosis.  4. The aortic valve is tricuspid. Aortic valve regurgitation is trivial.  No aortic stenosis is present.  5. The inferior vena cava is dilated in size with <50% respiratory  variability, suggesting right atrial pressure of 15 mmHg.   Comparison(s): No significant change from prior study.  _____________   History of Present Illness     [name] is a 69 y.o. female with a history of COPD, type 2  diabetes mellitus, hyperlipidemia, and morbid obesity who presented to the hospital after a single episode of chest tightness while she was shopping at a grocery store. The patient stated that she developed substernal chest pain as she was exerting herself. The sensation lasted for less than 5 minutes.  There was no radiation of the chest pain. She had some associated diaphoresis.  She denied any nausea, vomiting or dyspnea. For the past few weeks she has noticed similar discomfort on exertion. Greater than 5 years ago she recalled having had a stress test.  At that time her cardiac work-up was negative.   In the hospital her blood pressure is 119/67 mmHg with a heart rate of 74 bpm.  Her oxygen saturation is 97%.  The electrocardiogram revealed nonspecific ST-T wave abnormalities in the lateral leads.  There was also a prolonged QT interval.  The high-sensitivity troponins were 212 and 3.  Her labs were as follows: Potassium 3.7, creatinine 0.67, WBC 12.4, hemoglobin 12.7, hematocrit 42.7 and platelets 205.  The chest x-ray was unremarkable. She was admitted for further work up.   Hospital Course     1. Chest pain: initial hsTn was elevated at 212 but repeat were normal. Stated she had several episodes of chest tightness while at rest over the past 2 weeks, but episode the day of admission was the worst. EKG with nonspecific TW changes in lateral leads. Underwent coronary CT with coronary Ca+ score of 5 with mild CAD in the mLAD. Follow up echo showed normal EF with no WMA. No further chest pain during admission.  Plan to treat medically.   2. IDDM: on metformin, Toujeo and Trulicity prior to admission. Hgb A1c 7.0  3. HL: not on statin prior admission. LDL 86. Started on high dose statin.   General: Well developed, well nourished, obese older AA female appearing in no acute distress. Head: Normocephalic, atraumatic.  Neck: Supple without bruits, JVD. Lungs:  Resp regular and unlabored,  CTA. Heart: RRR, S1, S2, no S3, S4, or murmur; no rub. Abdomen: Soft, non-tender, non-distended with normoactive bowel sounds. No hepatomegaly. No rebound/guarding. No obvious abdominal masses. Extremities: No clubbing, cyanosis, edema. Distal pedal pulses are 2+ bilaterally.  Neuro: Alert and oriented X 3. Moves all extremities spontaneously. Psych: Normal affect.  PAYTIENCE BURES was seen by Dr. Tresa Endo and determined stable for discharge home. Follow up in the office has been arranged. Medications are listed below.   _____________  Discharge Vitals Blood pressure (!) 114/51, pulse 71, temperature 98 F (36.7 C), temperature source Oral, resp. rate 16, height  (1.6 m), weight 119.2 kg, SpO2 100 %.  Filed Weights   01/22/20 2210 01/23/20 0139 01/23/20 0610  Weight: 117.9 kg 119.2 kg 119.2 kg    Labs & Radiologic Studies    CBC Recent Labs    01/22/20 2305 01/24/20 0407  WBC 12.4* 11.0*  NEUTROABS 7.0  --   HGB 12.7 11.4*  HCT 42.7 39.3  MCV 81.6 82.7  PLT 205 205   Basic Metabolic Panel Recent Labs    16/10/96 2305  NA 139  K 3.7  CL 105  CO2 27  GLUCOSE 106*  BUN 16  CREATININE 0.67  CALCIUM 8.6*   Liver Function Tests Recent Labs    01/22/20 2305  AST 12*  ALT 10  ALKPHOS 61  BILITOT 0.7  PROT 6.4*  ALBUMIN 3.4*   No results for input(s): LIPASE, AMYLASE in the last 72 hours. Cardiac Enzymes No results for input(s): CKTOTAL, CKMB, CKMBINDEX, TROPONINI in the last 72 hours. BNP Invalid input(s): POCBNP D-Dimer No results for input(s): DDIMER in the last 72 hours. Hemoglobin A1C Recent Labs    01/23/20 0341  HGBA1C 7.0*   Fasting Lipid Panel Recent Labs    01/23/20 0341  CHOL 147  HDL 28*  LDLCALC 86  TRIG 045*  CHOLHDL 5.3   Thyroid Function Tests No results for input(s): TSH, T4TOTAL, T3FREE, THYROIDAB in the last 72 hours.  Invalid input(s): FREET3 _____________  CT CORONARY MORPH W/CTA COR W/SCORE W/CA W/CM &/OR  WO/CM  Addendum Date: 01/23/2020   ADDENDUM REPORT: 01/23/2020 16:55 CLINICAL DATA:  Chest pain EXAM: Cardiac/Coronary CTA TECHNIQUE: The patient was scanned on a [employer]. A 100 kV prospective scan was triggered in the descending thoracic aorta at 111 HU's. Axial non-contrast 3 mm slices were carried out through the heart. The data set was analyzed on a dedicated work station and scored using the Agatson method. Gantry rotation speed was 250 msecs and collimation was .6 mm. No beta blockade and 0.8 mg of sl NTG was given. The 3D data set was reconstructed in 5% intervals of the 35-75 % of the R-R cycle. Diastolic phases were analyzed on a dedicated work station using MPR, MIP and VRT modes. The patient received 80 cc of contrast. FINDINGS: Image quality: Average. Noise artifact is: Moderate signal-to-noise due to patient obesity (BMI 46). This prohibited evaluation of the distal LCX and PDA. Coronary Arteries:  Normal coronary origin.  Left dominance. Left main: The left main is a large  caliber, short vessel with a normal take off from the left coronary cusp that bifurcates to form a left anterior descending artery and a left circumflex artery. There is no plaque or stenosis. Left anterior descending artery: The proximal LAD contains minimal calcified plaque. The mid LAD contains mild non-calcified plaque (25-49%). The distal LAD is patent. Left circumflex artery: The LCX is dominant. The LCX contains minimal non-calcified plaque (<25%). The LCX gives off 1 large obtuse marginal branch with minimal non-calcified plaque. The LCX appears to supply the PDA but evaluation of this is limited due to moderate signal to noise artifact. Right coronary artery: The RCA is non-dominant with normal take off from the right coronary cusp. There is minimal non-calcified plaque (<25%). Right Atrium: Right atrial size is within normal limits. Right Ventricle: The right ventricular cavity is within normal limits. Left  Atrium: Left atrial size is normal in size with no left atrial appendage filling defect. Left Ventricle: The ventricular cavity size is within normal limits. There are no stigmata of prior infarction. There is no abnormal filling defect. Pulmonary arteries: Mildly dilated without proximal filling defect. Pulmonary veins: Normal pulmonary venous drainage. Pericardium: Normal thickness with no significant effusion or calcium present. Cardiac valves: The aortic valve is trileaflet without significant calcification. The mitral valve is normal structure without significant calcification. Aorta: Normal caliber with no significant disease. Extra-cardiac findings: See attached radiology report for non-cardiac structures. IMPRESSION: 1. Coronary calcium score of 5. This was 54th percentile for age and sex matched controls. 2. Normal coronary origin with left dominance. 3. Mild CAD in the mid LAD (25-49%). 4. Minimal CAD in the LCX and non-dominant RCA. 5. Limited evaluation of the distal LCX/PDA due to moderate signal to noise artifact due to obesity (BMI 46). RECOMMENDATIONS: 1. Mild non-obstructive CAD (25-49%). Consider non-atherosclerotic causes of chest pain. Consider preventive therapy and risk factor modification. Lennie Odor, MD Electronically Signed   By: Lennie Odor   On: 01/23/2020 16:55   Result Date: 01/23/2020 EXAM: OVER-READ INTERPRETATION  CT CHEST The following report is an over-read performed by radiologist Dr. Trudie Reed of [facility] on 01/23/2020. This over-read does not include interpretation of cardiac or coronary anatomy or pathology. The coronary calcium score/coronary CTA interpretation by the cardiologist is attached. COMPARISON:  None. FINDINGS: Within the visualized portions of the thorax there are no suspicious appearing pulmonary nodules or masses, there is no acute consolidative airspace disease, no pleural effusions, no pneumothorax and no lymphadenopathy. Visualized  portions of the upper abdomen are unremarkable. There are no aggressive appearing lytic or blastic lesions noted in the visualized portions of the skeleton. IMPRESSION: 1. No significant incidental noncardiac findings are noted Electronically Signed: By: Trudie Reed M.D. On: 01/23/2020 16:06   DG Chest Portable 1 View  Result Date: 01/22/2020 CLINICAL DATA:  Chest pain for several hours EXAM: PORTABLE CHEST 1 VIEW COMPARISON:  Film from earlier in the same day. FINDINGS: Cardiac shadow is within normal limits. Aortic calcifications are again seen. The lungs are clear. Previously seen left basilar atelectasis is not well appreciated. No bony abnormality is noted. IMPRESSION: No acute abnormality seen. Electronically Signed   By: Alcide Clever M.D.   On: 01/22/2020 22:49   ECHOCARDIOGRAM COMPLETE  Result Date: 01/23/2020    ECHOCARDIOGRAM REPORT   Patient Name:   JAMISYN LANGER Date of Exam: 01/23/2020 Medical Rec #:  [id number]      Height:       63.0 in Accession #:  [id number]     Weight:       262.7 lb Date of Birth:  03-26-1951       BSA:          2.171 m Patient Age:    69 years       BP:           109/44 mmHg Patient Gender: F              HR:           65 bpm. Exam Location:  Inpatient Procedure: 2D Echo, Color Doppler and Cardiac Doppler Indications:    R07.9* Chest pain, unspecified  History:        Patient has prior history of Echocardiogram examinations, most                 recent 09/28/2017. CHF, NSTEMI, COPD; Risk Factors:Diabetes,                 Dyslipidemia and Sleep Apnea.  Sonographer:    Irving Burton Senior RDCS Referring Phys: [id number] Lonie Peak IMPRESSIONS  1. Left ventricular ejection fraction, by estimation, is 40 to 45%. The left ventricle has mildly decreased function. The left ventricle demonstrates global hypokinesis. Left ventricular diastolic parameters are indeterminate.  2. Right ventricular systolic function is normal. The right ventricular size is normal.  3. The mitral  valve is normal in structure. Trivial mitral valve regurgitation. No evidence of mitral stenosis.  4. The aortic valve is tricuspid. Aortic valve regurgitation is trivial. No aortic stenosis is present.  5. The inferior vena cava is dilated in size with <50% respiratory variability, suggesting right atrial pressure of 15 mmHg. Comparison(s): No significant change from prior study. Conclusion(s)/Recommendation(s): Difficult images, but there is not a clear focal wall motion abnormality (though septum appears sluggish). Overall slightly reduced LVEF. FINDINGS  Left Ventricle: Left ventricular ejection fraction, by estimation, is 40 to 45%. The left ventricle has mildly decreased function. The left ventricle demonstrates global hypokinesis. The left ventricular internal cavity size was normal in size. There is  borderline asymmetric left ventricular hypertrophy. Left ventricular diastolic parameters are indeterminate. Right Ventricle: The right ventricular size is normal. No increase in right ventricular wall thickness. Right ventricular systolic function is normal. Left Atrium: Left atrial size was normal in size. Right Atrium: Right atrial size was normal in size. Pericardium: There is no evidence of pericardial effusion. Mitral Valve: The mitral valve is normal in structure. There is mild thickening of the mitral valve leaflet(s). Trivial mitral valve regurgitation. No evidence of mitral valve stenosis. Tricuspid Valve: The tricuspid valve is normal in structure. Tricuspid valve regurgitation is trivial. No evidence of tricuspid stenosis. Aortic Valve: The aortic valve is tricuspid. Aortic valve regurgitation is trivial. No aortic stenosis is present. Pulmonic Valve: The pulmonic valve was not well visualized. Pulmonic valve regurgitation is not visualized. No evidence of pulmonic stenosis. Aorta: The aortic root, ascending aorta and aortic arch are all structurally normal, with no evidence of dilitation or  obstruction. Venous: The inferior vena cava is dilated in size with less than 50% respiratory variability, suggesting right atrial pressure of 15 mmHg. IAS/Shunts: The atrial septum is grossly normal.  LEFT VENTRICLE PLAX 2D LVIDd:         4.80 cm      Diastology LVIDs:         4.40 cm      LV e' lateral:   7.51 cm/s LV PW:  1.30 cm      LV E/e' lateral: 11.5 LV IVS:        0.80 cm      LV e' medial:    6.85 cm/s LVOT diam:     2.20 cm      LV E/e' medial:  12.6 LV SV:         95 LV SV Index:   44 LVOT Area:     3.80 cm  LV Volumes (MOD) LV vol d, MOD A2C: 141.0 ml LV vol d, MOD A4C: 137.0 ml LV vol s, MOD A2C: 77.4 ml LV vol s, MOD A4C: 84.8 ml LV SV MOD A2C:     63.6 ml LV SV MOD A4C:     137.0 ml LV SV MOD BP:      59.8 ml RIGHT VENTRICLE RV S prime:     13.50 cm/s TAPSE (M-mode): 2.2 cm LEFT ATRIUM             Index       RIGHT ATRIUM           Index LA diam:        3.70 cm 1.70 cm/m  RA Area:     15.70 cm LA Vol (A2C):   63.5 ml 29.25 ml/m RA Volume:   39.00 ml  17.96 ml/m LA Vol (A4C):   51.5 ml 23.72 ml/m LA Biplane Vol: 58.9 ml 27.13 ml/m  AORTIC VALVE LVOT Vmax:   103.00 cm/s LVOT Vmean:  73.700 cm/s LVOT VTI:    0.251 m  AORTA Ao Root diam: 2.80 cm Ao Asc diam:  3.30 cm MITRAL VALVE MV Area (PHT): 3.60 cm     SHUNTS MV Decel Time: 211 msec     Systemic VTI:  0.25 m MV E velocity: 86.60 cm/s   Systemic Diam: 2.20 cm MV A velocity: 108.00 cm/s MV E/A ratio:  0.80 Buford Dresser MD Electronically signed by Buford Dresser MD Signature Date/Time: 01/23/2020/5:39:24 PM    Final    Disposition   Pt is being discharged home today in good condition.  Follow-up Plans & Appointments    Follow-up Information    Deberah Pelton, NP Follow up on 02/26/2020.   Specialty: Cardiology Why: at 8:45am for your follow up appt.  Contact information: [address] [place] [address] [phone]          Discharge Instructions    Diet - low sodium heart healthy    Complete by: As directed    Increase activity slowly   Complete by: As directed        Discharge Medications     Medication List    TAKE these medications   Advair Diskus 500-50 MCG/DOSE Aepb Generic drug: Fluticasone-Salmeterol Inhale 1 puff into the lungs 2 (two) times daily.   albuterol 108 (90 Base) MCG/ACT inhaler Commonly known as: VENTOLIN HFA Inhale 2 puffs into the lungs every 4 (four) hours as needed for wheezing or shortness of breath.   aspirin EC 81 MG tablet Take 81 mg by mouth daily.   atorvastatin 80 MG tablet Commonly known as: LIPITOR Take 1 tablet (80 mg total) by mouth daily at 6 PM.   CENTRUM SILVER 50+WOMEN PO Take 1 tablet by mouth daily.   Enbrel SureClick 50 MG/ML injection Generic drug: etanercept Inject 50 mg into the skin once a week.   fluticasone 50 MCG/ACT nasal spray Commonly known as: FLONASE Place 2 sprays into both nostrils daily as  needed for allergies.   furosemide 40 MG tablet Commonly known as: LASIX Take 1 tablet (40 mg total) by mouth daily.   ipratropium-albuterol 0.5-2.5 (3) MG/3ML Soln Commonly known as: DUONEB Take 3 mLs by nebulization 4 (four) times daily. What changed:   when to take this  reasons to take this   metFORMIN 1000 MG tablet Commonly known as: GLUCOPHAGE Take 1,000 mg by mouth 2 (two) times daily with a meal.   montelukast 10 MG tablet Commonly known as: SINGULAIR Take 10 mg by mouth every morning.   omeprazole 40 MG capsule Commonly known as: PRILOSEC Take 40 mg by mouth every morning.   oxybutynin 15 MG 24 hr tablet Commonly known as: DITROPAN XL Take 15 mg by mouth daily.   Toujeo SoloStar 300 UNIT/ML Solostar Pen Generic drug: insulin glargine (1 Unit Dial) Inject 25 Units into the skin at bedtime.   traMADol 50 MG tablet Commonly known as: ULTRAM Take 50 mg by mouth every 6 (six) hours as needed for pain.   Trulicity 1.5 MG/0.5ML Sopn Generic drug: Dulaglutide Inject 1.5 mg  into the skin every Thursday.       No                               Did the patient have a percutaneous coronary intervention (stent / angioplasty)?:  No.      Outstanding Labs/Studies   FLP/LFTs in 8 weeks.  Duration of Discharge Encounter   Greater than 30 minutes including physician time.  Signed, Latanga Nedrow NP-C 01/24/2020, 10:51 AM   Patient seen and examined. Agree with assessment and plan.  Ms. Eastlick feels well today.  I reviewed her CT angio study and findings.  Calcium score 5.  No significant obstructive disease with mild nonobstructive plaque.  I discussed the importance of lifestyle adjustment, improve diet, and medical management.  Target LDL is less than 70.  She is tolerating atorvastatin.  Chest pain is atypical for CAD.  We will plan for discharge today with follow-up as outpatient with lifestyle modification.   Lennette Bihari, MD, [facility] 01/24/2020 12:02 PM

## 2020-01-24 NOTE — Progress Notes (Signed)
ANTICOAGULATION CONSULT NOTE  Pharmacy Consult for heparin Indication: chest pain/ACS  Allergies  Allergen Reactions  . Liraglutide Nausea And Vomiting  . Pioglitazone Swelling  . Amoxicillin-Pot Clavulanate Nausea And Vomiting    Did it involve swelling of the face/tongue/throat, SOB, or low BP? No Did it involve sudden or severe rash/hives, skin peeling, or any reaction on the inside of your mouth or nose? No Did you need to seek medical attention at a hospital or doctor's office? No When did it last happen?less than 10 years If all above answers are "NO", may proceed with cephalosporin use.     Patient Measurements: Height: 5\' 3"  (160 cm) Weight: 119.2 kg (262 lb 11.2 oz) IBW/kg (Calculated) : 52.4 Heparin Dosing Weight: 81.2 kg  Vital Signs: Temp: 98 F (36.7 C) (04/01 0643) Temp Source: Oral (04/01 0643) BP: 114/51 (04/01 0643) Pulse Rate: 71 (04/01 0643)  Labs: Recent Labs    01/22/20 2216 01/22/20 2305 01/23/20 0030 01/23/20 0341 01/23/20 0656 01/23/20 1906 01/24/20 0407  HGB  --  12.7  --   --   --   --  11.4*  HCT  --  42.7  --   --   --   --  39.3  PLT  --  205  --   --   --   --  205  HEPARINUNFRC  --   --   --   --  0.18* 0.30 0.43  CREATININE  --  0.67  --   --   --   --   --   TROPONINIHS   < >  --  3 3 3   --   --    < > = values in this interval not displayed.    Estimated Creatinine Clearance: 81.7 mL/min (by C-G formula based on SCr of 0.67 mg/dL).   Medical History: Past Medical History:  Diagnosis Date  . Asthma   . Collagen vascular disease (HCC)   . COPD (chronic obstructive pulmonary disease) (HCC)    O2 increased to 3L while ambulating, 2L at rest as of last week  . Diabetes mellitus without complication (HCC)   . Feeling of chest tightness 12/2019  . High cholesterol   . 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 Morbid obesity Kelli Rivera)    Assessment: 69 yr old female presented with CP,  elevated initial troponin, and was started on IV heparin.    Heparin level was 0.43 units/mL (within goal) on heparin infusion of 1300 units/hr. CBC stable.   Goal of Therapy:  Heparin level 0.3-0.7 units/ml Monitor platelets by anticoagulation protocol: Yes   Plan:  Continue heparin infusion at 1300 units/hr Monitor daily heparin level, CBC Monitor for signs/symptoms of bleeding  [facility], PharmD  PGY1 Acute Care Pharmacy Resident 01/24/2020

## 2020-02-25 NOTE — Progress Notes (Signed)
Cardiology Clinic Note   Patient Name: Kelli Rivera Date of Encounter: 02/26/2020  Primary Care Provider:  Angelica Chessman, MD Primary Cardiologist:  Nicki Guadalajara, MD  Patient Profile    Kelli Rivera. Kelli Rivera 69 year old female presents in the office today for an evaluation of her chest pain.  Past Medical History    Past Medical History:  Diagnosis Date  . Asthma   . Collagen vascular disease (HCC)   . COPD (chronic obstructive pulmonary disease) (HCC)    O2 increased to 3L while ambulating, 2L at rest as of last week  . Diabetes mellitus without complication (HCC)   . Feeling of chest tightness 12/2019  . High cholesterol   . History of leukocytosis    negative bone marrow, followed by heme q42mo  . Morbid obesity (HCC)    Past Surgical History:  Procedure Laterality Date  . ABDOMINAL HYSTERECTOMY    . APPENDECTOMY     age 89 yrs  . bladder tuck    . CHOLECYSTECTOMY      Allergies  Allergies  Allergen Reactions  . Liraglutide Nausea And Vomiting  . Pioglitazone Swelling  . Amoxicillin-Pot Clavulanate Nausea And Vomiting    Did it involve swelling of the face/tongue/throat, SOB, or low BP? No Did it involve sudden or severe rash/hives, skin peeling, or any reaction on the inside of your mouth or nose? No Did you need to seek medical attention at a hospital or doctor's office? No When did it last happen?less than 10 years If all above answers are "NO", may proceed with cephalosporin use.     History of Present Illness    Kelli Rivera has a PMH of venous insufficiency bilateral lower extremities, chronic diastolic CHF, NSTEMI, COPD, OSA, DM 2, GERD, and mild renal insufficiency.   She was recently admitted to [facility] 3/30/ 21-01/24/20.  For chest tightness while shopping at the grocery store.  The pain lasted for 5 minutes and subsided there was no radiation.  She has some associated diaphoresis but denied vomiting and dyspnea.  She indicated that for the past  several weeks she had noticed similar discomfort with exertion.  Her high-sensitivity troponins were elevated at 212 but on repeat were normal.  EKG showed nonspecific T wave changes in lateral leads.  She underwent a coronary CT which showed a coronary calcium score of 5 with mild CAD in the mid LAD.  A plan was made to treat medically.  Echocardiogram from 01/23/2020 showed an LVEF of 40-45%, intermediate diastolic parameters, trivial aortic regurgitation and no significant changes from prior study.  She presents the clinic today for follow-up evaluation and states she notices a fullness in her throat when she lays down in the evening.  She denies chest pain with exertion.  She has taken Prilosec for the last several years and continues to take it.  She does notice some activity intolerance with normal daily activities.  She continues on 2 L of home oxygen.  Her symptoms appear to be related to GERD.  I will switch her from Prilosec to Protonix and have her follow-up with pulmonology for further pulmonary evaluation.  Follow-up with Dr. Tresa Endo in 3 months.  She denies increased shortness of breath, lower extremity edema, fatigue, palpitations, melena, hematuria, hemoptysis, diaphoresis, weakness, presyncope, syncope, orthopnea, and PND.  Home Medications    Prior to Admission medications   Medication Sig Start Date End Date Taking? Authorizing Provider  albuterol (PROVENTIL HFA;VENTOLIN HFA) 108 (90 Base) MCG/ACT inhaler Inhale  2 puffs into the lungs every 4 (four) hours as needed for wheezing or shortness of breath. 12/17/15   [provider]  aspirin EC 81 MG tablet Take 81 mg by mouth daily.    [provider]  atorvastatin (LIPITOR) 80 MG tablet Take 1 tablet (80 mg total) by mouth daily at 6 PM. 01/24/20   Arty Baumgartner, NP  ENBREL SURECLICK 50 MG/ML injection Inject 50 mg into the skin once a week. 01/15/20   [provider]  fluticasone (FLONASE) 50 MCG/ACT nasal  spray Place 2 sprays into both nostrils daily as needed for allergies. 10/11/18   [provider]  Fluticasone-Salmeterol (ADVAIR DISKUS) 500-50 MCG/DOSE AEPB Inhale 1 puff into the lungs 2 (two) times daily.     [provider]  furosemide (LASIX) 40 MG tablet Take 1 tablet (40 mg total) by mouth daily. 10/01/17   Ghimire, Werner Lean, MD  ipratropium-albuterol (DUONEB) 0.5-2.5 (3) MG/3ML SOLN Take 3 mLs by nebulization 4 (four) times daily. Patient taking differently: Take 3 mLs by nebulization 4 (four) times daily as needed (shortness of breath).  10/01/17   Ghimire, Werner Lean, MD  metFORMIN (GLUCOPHAGE) 1000 MG tablet Take 1,000 mg by mouth 2 (two) times daily with a meal.     [provider]  montelukast (SINGULAIR) 10 MG tablet Take 10 mg by mouth every morning.  08/18/15   [provider]  Multiple Vitamins-Minerals (CENTRUM SILVER 50+WOMEN PO) Take 1 tablet by mouth daily.    [provider]  omeprazole (PRILOSEC) 40 MG capsule Take 40 mg by mouth every morning.  08/31/16   [provider]  oxybutynin (DITROPAN XL) 15 MG 24 hr tablet Take 15 mg by mouth daily. 06/01/18   [provider]  TOUJEO SOLOSTAR 300 UNIT/ML SOPN Inject 25 Units into the skin at bedtime.  08/31/16   [provider]  traMADol (ULTRAM) 50 MG tablet Take 50 mg by mouth every 6 (six) hours as needed for pain. 01/04/20   [provider]  TRULICITY 1.5 MG/0.5ML SOPN Inject 1.5 mg into the skin every Thursday. 09/19/17   [provider]    Family History    Family History  Problem Relation Age of Onset  . Heart failure Mother 18  . Lung cancer Father 49   She indicated that her mother is deceased. She indicated that her father is deceased.  Social History    Social History   Socioeconomic History  . Marital status: Divorced    Spouse name: Not on file  . Number of children: Not on file  . Years of education: Not on file  .  Highest education level: Not on file  Occupational History  . Not on file  Tobacco Use  . Smoking status: Former Smoker    Packs/day: 1.00    Years: 42.00    Pack years: 42.00    Quit date: 2007    Years since quitting: 14.3  . Smokeless tobacco: Never Used  Substance and Sexual Activity  . Alcohol use: No  . Drug use: No  . Sexual activity: Not on file  Other Topics Concern  . Not on file  Social History Narrative  . Not on file   Social Determinants of Health   Financial Resource Strain:   . Difficulty of Paying Living Expenses:   Food Insecurity:   . Worried About Programme researcher, broadcasting/film/video in the Last Year:   . [employer] of Food in the  Last Year:   Transportation Needs:   . Film/video editor (Medical):   Marland Kitchen Lack of Transportation (Non-Medical):   Physical Activity:   . Days of Exercise per Week:   . Minutes of Exercise per Session:   Stress:   . Feeling of Stress :   Social Connections:   . Frequency of Communication with Friends and Family:   . Frequency of Social Gatherings with Friends and Family:   . Attends Religious Services:   . Active Member of Clubs or Organizations:   . Attends Archivist Meetings:   Marland Kitchen Marital Status:   Intimate Partner Violence:   . Fear of Current or Ex-Partner:   . Emotionally Abused:   Marland Kitchen Physically Abused:   . Sexually Abused:      Review of Systems    General:  No chills, fever, night sweats or weight changes.  Cardiovascular:  No chest pain, dyspnea on exertion, edema, orthopnea, palpitations, paroxysmal nocturnal dyspnea. Dermatological: No rash, lesions/masses Respiratory: No cough, dyspnea Urologic: No hematuria, dysuria Abdominal:   No nausea, vomiting, diarrhea, bright red blood per rectum, melena, or hematemesis Neurologic:  No visual changes, wkns, changes in mental status. All other systems reviewed and are otherwise negative except as noted above.  Physical Exam    VS:  BP 122/72   Pulse 98   Ht 5\' 3"   (1.6 m)   Wt 269 lb (122 kg)   SpO2 98%   BMI 47.65 kg/m  , BMI Body mass index is 47.65 kg/m. GEN: Well nourished, well developed, in no acute distress. HEENT: normal. Neck: Supple, no JVD, carotid bruits, or masses. Cardiac: RRR, no murmurs, rubs, or gallops. No clubbing, cyanosis, edema.  Radials/DP/PT 2+ and equal bilaterally.  Respiratory:  Respirations regular and unlabored, clear to auscultation bilaterally. GI: Soft, nontender, nondistended, BS + x 4. MS: no deformity or atrophy. Skin: warm and dry, no rash. Neuro:  Strength and sensation are intact. Psych: Normal affect.  Accessory Clinical Findings    ECG personally reviewed by me today-none today. EKG 01/23/2020 Sinus rhythm with occasional PVCs low voltage QRS anterior MI undetermined age 56 bpm  Echocardiogram 01/23/2020 IMPRESSIONS    1. Left ventricular ejection fraction, by estimation, is 40 to 45%. The  left ventricle has mildly decreased function. The left ventricle  demonstrates global hypokinesis. Left ventricular diastolic parameters are  indeterminate.  2. Right ventricular systolic function is normal. The right ventricular  size is normal.  3. The mitral valve is normal in structure. Trivial mitral valve  regurgitation. No evidence of mitral stenosis.  4. The aortic valve is tricuspid. Aortic valve regurgitation is trivial.  No aortic stenosis is present.  5. The inferior vena cava is dilated in size with <50% respiratory  variability, suggesting right atrial pressure of 15 mmHg.   Comparison(s): No significant change from prior study.    Assessment & Plan   1.  Chest pain-no chest pain today.  High-sensitivity troponins were elevated at 212 but on repeat were normal.  EKG showed nonspecific T wave changes in lateral leads.  She underwent a coronary CT which showed a coronary calcium score of 5 with mild CAD in the mid LAD.  A plan was made to treat medically.  Echocardiogram from 01/23/2020  showed an LVEF of 40-45%, intermediate diastolic parameters, trivial aortic regurgitation and no significant changes from prior study.  This appears to be related to acid reflux. Continue aspirin, atorvastatin Heart healthy low-sodium diet-salty 6 given Increase  physical activity as tolerated Stop Prilosec Start Protonix 20 mg daily  Bilateral lower extremity edema-generalized bilateral lower extremity nonpitting edema. Lower extremity support stockings-8 given Heart healthy low-sodium diet Increase physical activity as tolerated Elevate extremities when not active  Hyperlipidemia-LDL 86 Continue atorvastatin Heart healthy low-sodium high-fiber diet Increase physical activity as tolerated  Diabetes-A1c 7.0 on 01/23/2020. Continue current therapy Monitored by PCP  COPD-no increased work of breathing today.  On oxygen 2 L unchanged in the past 5 years.  Oxygen saturation 98% today. Followed by pulmonary  Disposition: Follow-up with Dr. Tresa Endo in 3 months.  Thomasene Ripple. Gwen Edler NP-C    02/26/2020, 9:55 AM [facility] [address] Office [phone] Fax [phone]

## 2020-02-26 ENCOUNTER — Other Ambulatory Visit: Payer: Self-pay

## 2020-02-26 ENCOUNTER — Encounter: Payer: Self-pay | Admitting: General Practice

## 2020-02-26 ENCOUNTER — Ambulatory Visit (INDEPENDENT_AMBULATORY_CARE_PROVIDER_SITE_OTHER): Payer: Medicare Other | Admitting: General Practice

## 2020-02-26 VITALS — BP 122/72 | HR 98 | Ht 63.0 in | Wt 269.0 lb

## 2020-02-26 DIAGNOSIS — R600 Localized edema: Secondary | ICD-10-CM

## 2020-02-26 DIAGNOSIS — J449 Chronic obstructive pulmonary disease, unspecified: Secondary | ICD-10-CM

## 2020-02-26 DIAGNOSIS — R0789 Other chest pain: Secondary | ICD-10-CM | POA: Diagnosis not present

## 2020-02-26 DIAGNOSIS — E7800 Pure hypercholesterolemia, unspecified: Secondary | ICD-10-CM | POA: Diagnosis not present

## 2020-02-26 DIAGNOSIS — Z794 Long term (current) use of insulin: Secondary | ICD-10-CM

## 2020-02-26 DIAGNOSIS — E118 Type 2 diabetes mellitus with unspecified complications: Secondary | ICD-10-CM

## 2020-02-26 MED ORDER — PANTOPRAZOLE SODIUM 20 MG PO TBEC: 20.0000 mg | DELAYED_RELEASE_TABLET | Freq: Every day | ORAL | 3 refills | Status: DC

## 2020-02-26 NOTE — Patient Instructions (Addendum)
Medication Instructions:   STOP Prilosec  START Protonix 20 mg daily.  *If you need a refill on your cardiac medications before your next appointment, please call your pharmacy*    Follow-Up: At [facility], you and your health needs are our priority.  As part of our continuing mission to provide you with exceptional heart care, we have created designated Provider Care Teams.  These Care Teams include your primary Cardiologist (physician) and Advanced Practice Providers (APPs -  Physician Assistants and Nurse Practitioners) who all work together to provide you with the care you need, when you need it.  We recommend signing up for the patient portal called "MyChart".  Sign up information is provided on this After Visit Summary.  MyChart is used to connect with patients for Virtual Visits (Telemedicine).  Patients are able to view lab/test results, encounter notes, upcoming appointments, etc.  Non-urgent messages can be sent to your provider as well.   To learn more about what you can do with MyChart, go to [email or web address].    Your next appointment:   3 month(s)  The format for your next appointment:   In Person  Provider:   Nicki Guadalajara, MD   Other Instructions Please purchase compression stockings.  Please schedule an appointment with your Pulmonologist.  Please review the [place] sheet given to you today along with the GERD Diet information below.   Food Choices for Gastroesophageal Reflux Disease, Adult When you have gastroesophageal reflux disease (GERD), the foods you eat and your eating habits are very important. Choosing the right foods can help ease your discomfort. Think about working with a nutrition specialist (dietitian) to help you make good choices. What are tips for following this plan?  Meals  Choose healthy foods that are low in fat, such as fruits, vegetables, whole grains, low-fat dairy products, and lean meat, fish, and poultry.  Eat small  meals often instead of 3 large meals a day. Eat your meals slowly, and in a place where you are relaxed. Avoid bending over or lying down until 2-3 hours after eating.  Avoid eating meals 2-3 hours before bed.  Avoid drinking a lot of liquid with meals.  Cook foods using methods other than frying. Bake, grill, or broil food instead.  Avoid or limit: ? Chocolate. ? Peppermint or spearmint. ? Alcohol. ? Pepper. ? Black and decaffeinated coffee. ? Black and decaffeinated tea. ? Bubbly (carbonated) soft drinks. ? Caffeinated energy drinks and soft drinks.  Limit high-fat foods such as: ? Fatty meat or fried foods. ? Whole milk, cream, butter, or ice cream. ? Nuts and nut butters. ? Pastries, donuts, and sweets made with butter or shortening.  Avoid foods that cause symptoms. These foods may be different for everyone. Common foods that cause symptoms include: ? Tomatoes. ? Oranges, lemons, and limes. ? Peppers. ? Spicy food. ? Onions and garlic. ? Vinegar. Lifestyle  Maintain a healthy weight. Ask your doctor what weight is healthy for you. If you need to lose weight, work with your doctor to do so safely.  Exercise for at least 30 minutes for 5 or more days each week, or as told by your doctor.  Wear loose-fitting clothes.  Do not smoke. If you need help quitting, ask your doctor.  Sleep with the head of your bed higher than your feet. Use a wedge under the mattress or blocks under the bed frame to raise the head of the bed. Summary  When you have gastroesophageal reflux disease (  GERD), food and lifestyle choices are very important in easing your symptoms.  Eat small meals often instead of 3 large meals a day. Eat your meals slowly, and in a place where you are relaxed.  Limit high-fat foods such as fatty meat or fried foods.  Avoid bending over or lying down until 2-3 hours after eating.  Avoid peppermint and spearmint, caffeine, alcohol, and chocolate. This  information is not intended to replace advice given to you by your health care provider. Make sure you discuss any questions you have with your health care provider. Document Revised: 02/01/2019 Document Reviewed: 11/16/2016 Elsevier Patient Education  [place].

## 2020-06-02 ENCOUNTER — Ambulatory Visit: Payer: Medicare Other | Admitting: Cardiovascular Disease

## 2020-06-06 ENCOUNTER — Emergency Department (HOSPITAL_BASED_OUTPATIENT_CLINIC_OR_DEPARTMENT_OTHER): Payer: Medicare Other

## 2020-06-06 ENCOUNTER — Encounter (HOSPITAL_BASED_OUTPATIENT_CLINIC_OR_DEPARTMENT_OTHER): Payer: Self-pay

## 2020-06-06 ENCOUNTER — Emergency Department (HOSPITAL_BASED_OUTPATIENT_CLINIC_OR_DEPARTMENT_OTHER)
Admission: EM | Admit: 2020-06-06 | Discharge: 2020-06-06 | Disposition: A | Discharge status: Home / Self Care | Payer: Medicare Other | Attending: Emergency Medicine | Admitting: Emergency Medicine

## 2020-06-06 ENCOUNTER — Other Ambulatory Visit: Payer: Self-pay

## 2020-06-06 DIAGNOSIS — I5032 Chronic diastolic (congestive) heart failure: Secondary | ICD-10-CM | POA: Diagnosis not present

## 2020-06-06 DIAGNOSIS — R791 Abnormal coagulation profile: Secondary | ICD-10-CM | POA: Diagnosis not present

## 2020-06-06 DIAGNOSIS — Z20822 Contact with and (suspected) exposure to covid-19: Secondary | ICD-10-CM | POA: Diagnosis not present

## 2020-06-06 DIAGNOSIS — Z7984 Long term (current) use of oral hypoglycemic drugs: Secondary | ICD-10-CM | POA: Insufficient documentation

## 2020-06-06 DIAGNOSIS — Z87891 Personal history of nicotine dependence: Secondary | ICD-10-CM | POA: Insufficient documentation

## 2020-06-06 DIAGNOSIS — E119 Type 2 diabetes mellitus without complications: Secondary | ICD-10-CM | POA: Diagnosis not present

## 2020-06-06 DIAGNOSIS — J449 Chronic obstructive pulmonary disease, unspecified: Secondary | ICD-10-CM | POA: Diagnosis not present

## 2020-06-06 DIAGNOSIS — Z7982 Long term (current) use of aspirin: Secondary | ICD-10-CM | POA: Diagnosis not present

## 2020-06-06 DIAGNOSIS — K219 Gastro-esophageal reflux disease without esophagitis: Secondary | ICD-10-CM | POA: Insufficient documentation

## 2020-06-06 DIAGNOSIS — J45909 Unspecified asthma, uncomplicated: Secondary | ICD-10-CM | POA: Insufficient documentation

## 2020-06-06 DIAGNOSIS — R7989 Other specified abnormal findings of blood chemistry: Secondary | ICD-10-CM

## 2020-06-06 LAB — D-DIMER, QUANTITATIVE: D-Dimer, Quant: 2.13 ug/mL-FEU — ABNORMAL HIGH (ref 0.00–0.50)

## 2020-06-06 LAB — SARS CORONAVIRUS 2 BY RT PCR (HOSPITAL ORDER, PERFORMED IN ~~LOC~~ HOSPITAL LAB): SARS Coronavirus 2: NEGATIVE

## 2020-06-06 LAB — HEPATIC FUNCTION PANEL
ALT: 13 U/L (ref 0–44)
AST: 16 U/L (ref 15–41)
Albumin: 3 g/dL — ABNORMAL LOW (ref 3.5–5.0)
Alkaline Phosphatase: 90 U/L (ref 38–126)
Bilirubin, Direct: 0.1 mg/dL (ref 0.0–0.2)
Indirect Bilirubin: 0.6 mg/dL (ref 0.3–0.9)
Total Bilirubin: 0.7 mg/dL (ref 0.3–1.2)
Total Protein: 6.4 g/dL — ABNORMAL LOW (ref 6.5–8.1)

## 2020-06-06 LAB — URINALYSIS, MICROSCOPIC (REFLEX)

## 2020-06-06 LAB — BASIC METABOLIC PANEL
Anion gap: 11 (ref 5–15)
BUN: 14 mg/dL (ref 8–23)
CO2: 30 mmol/L (ref 22–32)
Calcium: 8.5 mg/dL — ABNORMAL LOW (ref 8.9–10.3)
Chloride: 102 mmol/L (ref 98–111)
Creatinine, Ser: 0.73 mg/dL (ref 0.44–1.00)
GFR calc Af Amer: >60 mL/min (ref >60)
GFR calc non Af Amer: >60 mL/min (ref >60)
Glucose, Bld: 150 mg/dL — ABNORMAL HIGH (ref 70–99)
Potassium: 3.2 mmol/L — ABNORMAL LOW (ref 3.5–5.1)
Sodium: 143 mmol/L (ref 135–145)

## 2020-06-06 LAB — CBC
HCT: 39.8 % (ref 36.0–46.0)
Hemoglobin: 11.8 g/dL — ABNORMAL LOW (ref 12.0–15.0)
MCH: 24.1 pg — ABNORMAL LOW (ref 26.0–34.0)
MCHC: 29.6 g/dL — ABNORMAL LOW (ref 30.0–36.0)
MCV: 81.4 fL (ref 80.0–100.0)
Platelets: 222 10*3/uL (ref 150–400)
RBC: 4.89 MIL/uL (ref 3.87–5.11)
RDW: 16.4 % — ABNORMAL HIGH (ref 11.5–15.5)
WBC: 19.3 10*3/uL — ABNORMAL HIGH (ref 4.0–10.5)
nRBC: 0 % (ref 0.0–0.2)

## 2020-06-06 LAB — URINALYSIS, ROUTINE W REFLEX MICROSCOPIC
Bilirubin Urine: NEGATIVE
Glucose, UA: >=500 mg/dL — AB
Hgb urine dipstick: NEGATIVE
Ketones, ur: NEGATIVE mg/dL
Leukocytes,Ua: NEGATIVE
Nitrite: NEGATIVE
Protein, ur: NEGATIVE mg/dL
Specific Gravity, Urine: 1.01 (ref 1.005–1.030)
pH: 7.5 (ref 5.0–8.0)

## 2020-06-06 LAB — TROPONIN I (HIGH SENSITIVITY): Troponin I (High Sensitivity): 3 ng/L (ref <18)

## 2020-06-06 LAB — LIPASE, BLOOD: Lipase: 35 U/L (ref 11–51)

## 2020-06-06 MED ORDER — SUCRALFATE 1 G PO TABS: 1.0000 g | ORAL_TABLET | Freq: Three times a day (TID) | ORAL | 0 refills | Status: DC

## 2020-06-06 MED ORDER — PANTOPRAZOLE SODIUM 20 MG PO TBEC: 20.0000 mg | DELAYED_RELEASE_TABLET | Freq: Two times a day (BID) | ORAL | 0 refills | Status: AC

## 2020-06-06 MED ORDER — IOHEXOL 350 MG/ML SOLN
100.0000 mL | Freq: Once | INTRAVENOUS | Status: AC | PRN
  Administered 2020-06-06: 100 mL via INTRAVENOUS

## 2020-06-06 NOTE — ED Triage Notes (Signed)
Pt arrives with reports of high D-Dimer and being sent from PCP. Pt went to primary r/t abdominal pain/indigestion X1 week. Denies any radiation of pain. Denies SOB.

## 2020-06-06 NOTE — ED Provider Notes (Signed)
[facility] EMERGENCY DEPARTMENT Provider Note   CSN: [id number] Arrival date & time: 06/06/20  1154     History Chief Complaint  Patient presents with  . Elevated d dimer    Kelli Rivera is a 69 y.o. female.  HPI   Patient presents to the emergency room for evaluation of an elevated D-dimer.  Patient states this past week she has had issues with indigestion.  Patient states she has had an aching and sharp discomfort in the center of her chest.  The pain has been intermittent.  She has had burping.  She has tried multiple medications for this but the symptoms persisted.  Symptoms do not increase with activity.  Patient denies any abdominal pain.  No vomiting or diarrhea.  No dysuria.  Patient went to see her primary care doctor's office.  Patient had laboratory test.  She was found to have an elevated D-dimer.  Patient was sent to the ED for evaluation.  Past Medical History:  Diagnosis Date  . Asthma   . Collagen vascular disease (HCC)   . COPD (chronic obstructive pulmonary disease) (HCC)    O2 increased to 3L while ambulating, 2L at rest as of last week  . Diabetes mellitus without complication (HCC)   . Feeling of chest tightness 12/2019  . High cholesterol   . History of leukocytosis    negative bone marrow, followed by heme q9mo  . Morbid obesity [facility])     Patient Active Problem List   Diagnosis Date Noted  . Agatston coronary artery calcium score less than 100   . Type 2 diabetes mellitus without complication, with long-term current use of insulin (HCC)   . Chest pain   . NSTEMI (non-ST elevated myocardial infarction) (HCC) 01/22/2020  . Acute respiratory failure with hypoxia (HCC) 01/01/2019  . Chronic diastolic CHF (congestive heart failure) (HCC) 01/01/2019  . Mild renal insufficiency 01/01/2019  . Chronic dermatitis 07/06/2018  . Venous insufficiency of both lower extremities 07/06/2018  . Palmar erythema 07/06/2018  . RA (rheumatoid arthritis)  (HCC) 07/06/2018  . DJD (degenerative joint disease) 07/06/2018  . OSA (obstructive sleep apnea) 07/06/2018  . Eosinophilia 07/06/2018  . GERD (gastroesophageal reflux disease) 09/24/2017  . Iron deficiency 09/24/2017  . Leukocytosis 09/23/2016  . Morbid obesity (HCC) 09/23/2016  . COPD with acute exacerbation (HCC) 09/23/2016  . Hyperlipidemia 09/23/2016  . Diabetes mellitus type 2 in obese (HCC) 09/23/2016    Past Surgical History:  Procedure Laterality Date  . ABDOMINAL HYSTERECTOMY    . APPENDECTOMY     age 41 yrs  . bladder tuck    . CHOLECYSTECTOMY       OB History   No obstetric history on file.     Family History  Problem Relation Age of Onset  . Heart failure Mother 59  . Lung cancer Father 66    Social History   Tobacco Use  . Smoking status: Former Smoker    Packs/day: 1.00    Years: 42.00    Pack years: 42.00    Quit date: 2007    Years since quitting: 14.6  . Smokeless tobacco: Never Used  Vaping Use  . Vaping Use: Never used  Substance Use Topics  . Alcohol use: No  . Drug use: No    Home Medications Prior to Admission medications   Medication Sig Start Date End Date Taking? Authorizing Provider  albuterol (PROVENTIL HFA;VENTOLIN HFA) 108 (90 Base) MCG/ACT inhaler Inhale 2 puffs into the lungs  every 4 (four) hours as needed for wheezing or shortness of breath. 12/17/15   [provider]  aspirin EC 81 MG tablet Take 81 mg by mouth daily.    [provider]  atorvastatin (LIPITOR) 80 MG tablet Take 1 tablet (80 mg total) by mouth daily at 6 PM. 01/24/20   Arty Baumgartner, NP  ENBREL SURECLICK 50 MG/ML injection Inject 50 mg into the skin once a week. 01/15/20   [provider]  fluticasone (FLONASE) 50 MCG/ACT nasal spray Place 2 sprays into both nostrils daily as needed for allergies. 10/11/18   [provider]  Fluticasone-Salmeterol (ADVAIR DISKUS) 500-50 MCG/DOSE AEPB Inhale 1 puff into the lungs 2 (two)  times daily.     [provider]  furosemide (LASIX) 40 MG tablet Take 1 tablet (40 mg total) by mouth daily. 10/01/17   Ghimire, Werner Lean, MD  ipratropium-albuterol (DUONEB) 0.5-2.5 (3) MG/3ML SOLN Take 3 mLs by nebulization 4 (four) times daily. 10/01/17   Ghimire, Werner Lean, MD  metFORMIN (GLUCOPHAGE) 1000 MG tablet Take 1,000 mg by mouth 2 (two) times daily with a meal.     [provider]  montelukast (SINGULAIR) 10 MG tablet Take 10 mg by mouth every morning.  08/18/15   [provider]  Multiple Vitamins-Minerals (CENTRUM SILVER 50+WOMEN PO) Take 1 tablet by mouth daily.    [provider]  oxybutynin (DITROPAN XL) 15 MG 24 hr tablet Take 15 mg by mouth daily. 06/01/18   [provider]  pantoprazole (PROTONIX) 20 MG tablet Take 1 tablet (20 mg total) by mouth 2 (two) times daily. 06/06/20   Linwood Dibbles, MD  sucralfate (CARAFATE) 1 g tablet Take 1 tablet (1 g total) by mouth 4 (four) times daily -  with meals and at bedtime for 7 days. 06/06/20 06/13/20  Linwood Dibbles, MD  TOUJEO SOLOSTAR 300 UNIT/ML SOPN Inject 25 Units into the skin at bedtime.  08/31/16   [provider]  traMADol (ULTRAM) 50 MG tablet Take 50 mg by mouth every 6 (six) hours as needed for pain. 01/04/20   [provider]  TRULICITY 1.5 MG/0.5ML SOPN Inject 1.5 mg into the skin every Thursday. 09/19/17   [provider]    Allergies    Liraglutide, Pioglitazone, and Amoxicillin-pot clavulanate  Review of Systems   Review of Systems  All other systems reviewed and are negative.   Physical Exam Updated Vital Signs BP 131/77 (BP Location: Right Arm)   Pulse 71   Temp 98.6 F (37 C) (Oral)   Resp 15   Ht 1.6 m (5\' 3" )   Wt 120.2 kg   SpO2 98%   BMI 46.94 kg/m   Physical Exam Vitals and nursing note reviewed.  Constitutional:      General: She is not in acute distress.    Appearance: She is well-developed.  HENT:     Head: Normocephalic and  atraumatic.     Right Ear: External ear normal.     Left Ear: External ear normal.  Eyes:     General: No scleral icterus.       Right eye: No discharge.        Left eye: No discharge.     Conjunctiva/sclera: Conjunctivae normal.  Neck:     Trachea: No tracheal deviation.  Cardiovascular:     Rate and Rhythm: Normal rate and regular rhythm.  Pulmonary:     Effort: Pulmonary effort is normal. No respiratory distress.  Breath sounds: Normal breath sounds. No stridor. No wheezing or rales.  Abdominal:     General: Bowel sounds are normal. There is no distension.     Palpations: Abdomen is soft.     Tenderness: There is no abdominal tenderness. There is no guarding or rebound.  Musculoskeletal:        General: No tenderness.     Cervical back: Neck supple.  Skin:    General: Skin is warm and dry.     Findings: No rash.  Neurological:     Mental Status: She is alert.     Cranial Nerves: No cranial nerve deficit (no facial droop, extraocular movements intact, no slurred speech).     Sensory: No sensory deficit.     Motor: No abnormal muscle tone or seizure activity.     Coordination: Coordination normal.     ED Results / Procedures / Treatments   Labs (all labs ordered are listed, but only abnormal results are displayed) Labs Reviewed  BASIC METABOLIC PANEL - Abnormal; Notable for the following components:      Result Value   Potassium 3.2 (*)    Glucose, Bld 150 (*)    Calcium 8.5 (*)    All other components within normal limits  CBC - Abnormal; Notable for the following components:   WBC 19.3 (*)    Hemoglobin 11.8 (*)    MCH 24.1 (*)    MCHC 29.6 (*)    RDW 16.4 (*)    All other components within normal limits  D-DIMER, QUANTITATIVE (NOT AT [facility]) - Abnormal; Notable for the following components:   D-Dimer, Quant 2.13 (*)    All other components within normal limits  URINALYSIS, ROUTINE W REFLEX MICROSCOPIC - Abnormal; Notable for the following components:    APPearance HAZY (*)    Glucose, UA >=500 (*)    All other components within normal limits  HEPATIC FUNCTION PANEL - Abnormal; Notable for the following components:   Total Protein 6.4 (*)    Albumin 3.0 (*)    All other components within normal limits  URINALYSIS, MICROSCOPIC (REFLEX) - Abnormal; Notable for the following components:   Bacteria, UA FEW (*)    All other components within normal limits  SARS CORONAVIRUS 2 BY RT PCR (HOSPITAL ORDER, PERFORMED IN [facility] LAB)  LIPASE, BLOOD  TROPONIN I (HIGH SENSITIVITY)    EKG EKG Interpretation  Date/Time:  Friday June 06 2020 12:33:02 EDT Ventricular Rate:  79 PR Interval:  162 QRS Duration: 88 QT Interval:  384 QTC Calculation: 440 R Axis:   -21 Text Interpretation: Normal sinus rhythm Low voltage QRS Cannot rule out Anterior infarct , age undetermined Abnormal ECG No significant change since last tracing Confirmed by Linwood Dibbles [phone]) on 06/06/2020 12:46:30 PM   Radiology DG Chest 2 View  Result Date: 06/06/2020 CLINICAL DATA:  Indigestion. Elevated D-dimer. No reported shortness of breath. EXAM: CHEST - 2 VIEW COMPARISON:  01/22/2020 FINDINGS: Cardiac silhouette is normal in size. No mediastinal or hilar masses. No evidence of adenopathy. Clear lungs.  No pleural effusion or pneumothorax. Skeletal structures are intact. IMPRESSION: No active cardiopulmonary disease. Electronically Signed   By: Amie Portland M.D.   On: 06/06/2020 13:32   CT Angio Chest PE W and/or Wo Contrast  Result Date: 06/06/2020 CLINICAL DATA:  Pt with elevated d-dimer sent to ED from PMD's office; pt states she has chronic SOB and leg swelling; diabetic; no h/o PE or blood clots EXAM: CT  ANGIOGRAPHY CHEST WITH CONTRAST TECHNIQUE: Multidetector CT imaging of the chest was performed using the standard protocol during bolus administration of intravenous contrast. Multiplanar CT image reconstructions and MIPs were obtained to evaluate the vascular  anatomy. CONTRAST:  OMNIPAQUE IOHEXOL 350 MG/ML SOLN COMPARISON:  Current chest radiograph. FINDINGS: Cardiovascular: There is satisfactory opacification of the pulmonary arteries to the segmental level. There is no evidence of a pulmonary embolism. Heart is top-normal in size. No pericardial effusion. No coronary artery calcifications. Aorta is normal in caliber. No dissection mild atherosclerosis along the arch. Branch vessels are widely patent. Mediastinum/Nodes: Thyroid nodules, with evidence of a nodule enlarging the left lobe, at least 2 cm in size. No neck base axillary, mediastinal or hilar masses or enlarged lymph nodes. Trachea and esophagus are unremarkable. Lungs/Pleura: No pneumonia or pulmonary edema. No mass or suspicious nodule. Peripheral interstitial thickening mild changes of centrilobular emphysema. No pleural effusion or pneumothorax. Upper Abdomen: No acute abnormality. Musculoskeletal: No fracture or acute finding.  No bone lesion. Review of the MIP images confirms the above findings. IMPRESSION: 1. No evidence of a pulmonary embolism. 2. No acute findings. 3. Chronic appearing interstitial thickening. Mild centrilobular emphysema. 4. Mild aortic atherosclerosis. Aortic Atherosclerosis (ICD10-I70.0) and Emphysema (ICD10-J43.9). Electronically Signed   By: Amie Portland M.D.   On: 06/06/2020 14:45    Procedures Procedures (including critical care time)  Medications Ordered in ED Medications  iohexol (OMNIPAQUE) 350 MG/ML injection 100 mL (100 mLs Intravenous Contrast Given 06/06/20 1415)    ED Course  I have reviewed the triage vital signs and the nursing notes.  Pertinent labs & imaging results that were available during my care of the patient were reviewed by me and considered in my medical decision making (see chart for details).  Clinical Course as of Jun 06 1536  Fri Jun 06, 2020  1403 Labs reviewed.  Patient has an elevated white blood cell count.  D-dimer is also  elevated.  CT scan has been ordered.   [JK]    Clinical Course User Index [JK] Linwood Dibbles, MD   MDM Rules/Calculators/A&P                          Patient presented to the ED for evaluation of an abnormal D-dimer test.  Patient recently had chest pain.  She was at her primary care doctor's office and had blood tests and was noted to have an abnormal D-dimer so she was sent to the ED.  Patient was in no distress and she was not having any symptoms while she was in the ED.  EKG is reassuring.  Troponin is normal.  Patient did have an elevated D-dimer so a CT scan was performed.  Patient was also noted to have an elevated white blood cell count but I discussed this with the patient and she does have a history of this.  No signs of acute infection based on her evaluation today.  Her Covid test was negative.  CT scan does not show evidence of pulmonary embolism.  At this point her symptoms are most suggestive of acid reflux.  Will discharge home with antacids and Carafate.  Discussed outpatient follow-up. Final Clinical Impression(s) / ED Diagnoses Final diagnoses:  Positive D dimer  Gastroesophageal reflux disease, unspecified whether esophagitis present    Rx / DC Orders ED Discharge Orders         Ordered    pantoprazole (PROTONIX) 20 MG tablet  2  times daily     Discontinue  Reprint     06/06/20 1535    sucralfate (CARAFATE) 1 g tablet  3 times daily with meals & bedtime     Discontinue  Reprint     06/06/20 1535           Linwood Dibbles, MD 06/06/20 1537

## 2020-06-06 NOTE — Discharge Instructions (Addendum)
The CAT scan did not show any evidence of blood clot.  Take the antacid medications as prescribed.  Follow-up with your doctor to be rechecked if the symptoms persist

## 2020-06-16 ENCOUNTER — Inpatient Hospital Stay (HOSPITAL_COMMUNITY)
Admission: EM | Admit: 2020-06-16 | Discharge: 2020-06-22 | DRG: 872 | Disposition: A | Discharge status: Home Health | Payer: Medicare Other | Attending: Internal Medicine | Admitting: Internal Medicine

## 2020-06-16 ENCOUNTER — Emergency Department (HOSPITAL_COMMUNITY): Payer: Medicare Other

## 2020-06-16 ENCOUNTER — Other Ambulatory Visit: Payer: Self-pay

## 2020-06-16 ENCOUNTER — Encounter (HOSPITAL_COMMUNITY): Payer: Self-pay

## 2020-06-16 DIAGNOSIS — Z8249 Family history of ischemic heart disease and other diseases of the circulatory system: Secondary | ICD-10-CM

## 2020-06-16 DIAGNOSIS — R7881 Bacteremia: Secondary | ICD-10-CM | POA: Diagnosis present

## 2020-06-16 DIAGNOSIS — D849 Immunodeficiency, unspecified: Secondary | ICD-10-CM | POA: Diagnosis present

## 2020-06-16 DIAGNOSIS — Z7982 Long term (current) use of aspirin: Secondary | ICD-10-CM

## 2020-06-16 DIAGNOSIS — Z7984 Long term (current) use of oral hypoglycemic drugs: Secondary | ICD-10-CM | POA: Diagnosis not present

## 2020-06-16 DIAGNOSIS — E1169 Type 2 diabetes mellitus with other specified complication: Secondary | ICD-10-CM | POA: Diagnosis present

## 2020-06-16 DIAGNOSIS — Z6841 Body Mass Index (BMI) 40.0 and over, adult: Secondary | ICD-10-CM

## 2020-06-16 DIAGNOSIS — A419 Sepsis, unspecified organism: Secondary | ICD-10-CM

## 2020-06-16 DIAGNOSIS — M79609 Pain in unspecified limb: Secondary | ICD-10-CM | POA: Diagnosis not present

## 2020-06-16 DIAGNOSIS — Z79899 Other long term (current) drug therapy: Secondary | ICD-10-CM | POA: Diagnosis not present

## 2020-06-16 DIAGNOSIS — G8929 Other chronic pain: Secondary | ICD-10-CM | POA: Diagnosis present

## 2020-06-16 DIAGNOSIS — Z801 Family history of malignant neoplasm of trachea, bronchus and lung: Secondary | ICD-10-CM

## 2020-06-16 DIAGNOSIS — R6510 Systemic inflammatory response syndrome (SIRS) of non-infectious origin without acute organ dysfunction: Secondary | ICD-10-CM

## 2020-06-16 DIAGNOSIS — R52 Pain, unspecified: Secondary | ICD-10-CM

## 2020-06-16 DIAGNOSIS — Z87891 Personal history of nicotine dependence: Secondary | ICD-10-CM

## 2020-06-16 DIAGNOSIS — A409 Streptococcal sepsis, unspecified: Principal | ICD-10-CM | POA: Diagnosis present

## 2020-06-16 DIAGNOSIS — E669 Obesity, unspecified: Secondary | ICD-10-CM

## 2020-06-16 DIAGNOSIS — J449 Chronic obstructive pulmonary disease, unspecified: Secondary | ICD-10-CM | POA: Diagnosis present

## 2020-06-16 DIAGNOSIS — B951 Streptococcus, group B, as the cause of diseases classified elsewhere: Secondary | ICD-10-CM | POA: Diagnosis not present

## 2020-06-16 DIAGNOSIS — I5022 Chronic systolic (congestive) heart failure: Secondary | ICD-10-CM | POA: Diagnosis present

## 2020-06-16 DIAGNOSIS — L309 Dermatitis, unspecified: Secondary | ICD-10-CM | POA: Diagnosis present

## 2020-06-16 DIAGNOSIS — R931 Abnormal findings on diagnostic imaging of heart and coronary circulation: Secondary | ICD-10-CM

## 2020-06-16 DIAGNOSIS — N390 Urinary tract infection, site not specified: Secondary | ICD-10-CM | POA: Diagnosis present

## 2020-06-16 DIAGNOSIS — M47816 Spondylosis without myelopathy or radiculopathy, lumbar region: Secondary | ICD-10-CM | POA: Diagnosis present

## 2020-06-16 DIAGNOSIS — B9620 Unspecified Escherichia coli [E. coli] as the cause of diseases classified elsewhere: Secondary | ICD-10-CM | POA: Diagnosis present

## 2020-06-16 DIAGNOSIS — M069 Rheumatoid arthritis, unspecified: Secondary | ICD-10-CM | POA: Diagnosis present

## 2020-06-16 DIAGNOSIS — D638 Anemia in other chronic diseases classified elsewhere: Secondary | ICD-10-CM | POA: Diagnosis present

## 2020-06-16 DIAGNOSIS — Z20822 Contact with and (suspected) exposure to covid-19: Secondary | ICD-10-CM | POA: Diagnosis present

## 2020-06-16 DIAGNOSIS — E119 Type 2 diabetes mellitus without complications: Secondary | ICD-10-CM | POA: Diagnosis not present

## 2020-06-16 DIAGNOSIS — M869 Osteomyelitis, unspecified: Secondary | ICD-10-CM | POA: Diagnosis present

## 2020-06-16 DIAGNOSIS — Z794 Long term (current) use of insulin: Secondary | ICD-10-CM | POA: Diagnosis not present

## 2020-06-16 LAB — CBC WITH DIFFERENTIAL/PLATELET
Abs Immature Granulocytes: 0.11 10*3/uL — ABNORMAL HIGH (ref 0.00–0.07)
Basophils Absolute: 0.1 10*3/uL (ref 0.0–0.1)
Basophils Relative: 0 %
Eosinophils Absolute: 0.1 10*3/uL (ref 0.0–0.5)
Eosinophils Relative: 0 %
HCT: 36.2 % (ref 36.0–46.0)
Hemoglobin: 10.9 g/dL — ABNORMAL LOW (ref 12.0–15.0)
Immature Granulocytes: 1 %
Lymphocytes Relative: 5 %
Lymphs Abs: 0.8 10*3/uL (ref 0.7–4.0)
MCH: 24.6 pg — ABNORMAL LOW (ref 26.0–34.0)
MCHC: 30.1 g/dL (ref 30.0–36.0)
MCV: 81.7 fL (ref 80.0–100.0)
Monocytes Absolute: 0.8 10*3/uL (ref 0.1–1.0)
Monocytes Relative: 5 %
Neutro Abs: 14.8 10*3/uL — ABNORMAL HIGH (ref 1.7–7.7)
Neutrophils Relative %: 89 %
Platelets: 192 10*3/uL (ref 150–400)
RBC: 4.43 MIL/uL (ref 3.87–5.11)
RDW: 17 % — ABNORMAL HIGH (ref 11.5–15.5)
WBC: 16.5 10*3/uL — ABNORMAL HIGH (ref 4.0–10.5)
nRBC: 0 % (ref 0.0–0.2)

## 2020-06-16 LAB — I-STAT CHEM 8, ED
BUN: 21 mg/dL (ref 8–23)
Calcium, Ion: 1.22 mmol/L (ref 1.15–1.40)
Chloride: 106 mmol/L (ref 98–111)
Creatinine, Ser: 0.7 mg/dL (ref 0.44–1.00)
Glucose, Bld: 120 mg/dL — ABNORMAL HIGH (ref 70–99)
HCT: 35 % — ABNORMAL LOW (ref 36.0–46.0)
Hemoglobin: 11.9 g/dL — ABNORMAL LOW (ref 12.0–15.0)
Potassium: 5 mmol/L (ref 3.5–5.1)
Sodium: 142 mmol/L (ref 135–145)
TCO2: 28 mmol/L (ref 22–32)

## 2020-06-16 LAB — URINALYSIS, ROUTINE W REFLEX MICROSCOPIC
Bilirubin Urine: NEGATIVE
Glucose, UA: NEGATIVE mg/dL
Hgb urine dipstick: NEGATIVE
Ketones, ur: NEGATIVE mg/dL
Leukocytes,Ua: NEGATIVE
Nitrite: NEGATIVE
Protein, ur: NEGATIVE mg/dL
Specific Gravity, Urine: >1.046 — ABNORMAL HIGH (ref 1.005–1.030)
pH: 5 (ref 5.0–8.0)

## 2020-06-16 LAB — COMPREHENSIVE METABOLIC PANEL
ALT: 24 U/L (ref 0–44)
AST: 38 U/L (ref 15–41)
Albumin: 3 g/dL — ABNORMAL LOW (ref 3.5–5.0)
Alkaline Phosphatase: 69 U/L (ref 38–126)
Anion gap: 5 (ref 5–15)
BUN: 18 mg/dL (ref 8–23)
CO2: 25 mmol/L (ref 22–32)
Calcium: 8.3 mg/dL — ABNORMAL LOW (ref 8.9–10.3)
Chloride: 109 mmol/L (ref 98–111)
Creatinine, Ser: 0.75 mg/dL (ref 0.44–1.00)
GFR calc Af Amer: >60 mL/min (ref >60)
GFR calc non Af Amer: >60 mL/min (ref >60)
Glucose, Bld: 124 mg/dL — ABNORMAL HIGH (ref 70–99)
Potassium: 4.4 mmol/L (ref 3.5–5.1)
Sodium: 139 mmol/L (ref 135–145)
Total Bilirubin: 0.4 mg/dL (ref 0.3–1.2)
Total Protein: 6.2 g/dL — ABNORMAL LOW (ref 6.5–8.1)

## 2020-06-16 LAB — APTT: aPTT: 31 seconds (ref 24–36)

## 2020-06-16 LAB — LACTIC ACID, PLASMA: Lactic Acid, Venous: 1.6 mmol/L (ref 0.5–1.9)

## 2020-06-16 LAB — SARS CORONAVIRUS 2 BY RT PCR (HOSPITAL ORDER, PERFORMED IN ~~LOC~~ HOSPITAL LAB): SARS Coronavirus 2: NEGATIVE

## 2020-06-16 LAB — PROTIME-INR
INR: 1.1 (ref 0.8–1.2)
Prothrombin Time: 13.3 seconds (ref 11.4–15.2)

## 2020-06-16 MED ORDER — ATORVASTATIN CALCIUM 40 MG PO TABS
80.0000 mg | ORAL_TABLET | Freq: Every day | ORAL | Status: DC
  Administered 2020-06-17 – 2020-06-21 (×5): 80 mg via ORAL
  Filled 2020-06-16 (×5): qty 2

## 2020-06-16 MED ORDER — LACTATED RINGERS IV BOLUS (SEPSIS)
1000.0000 mL | Freq: Once | INTRAVENOUS | Status: AC
  Administered 2020-06-16: 1000 mL via INTRAVENOUS

## 2020-06-16 MED ORDER — SODIUM CHLORIDE 0.9 % IV SOLN
1.0000 g | INTRAVENOUS | Status: DC
  Administered 2020-06-16: 1 g via INTRAVENOUS
  Filled 2020-06-16: qty 10

## 2020-06-16 MED ORDER — LACTATED RINGERS IV BOLUS (SEPSIS)
1000.0000 mL | Freq: Once | INTRAVENOUS | Status: AC
  Administered 2020-06-17: 1000 mL via INTRAVENOUS

## 2020-06-16 MED ORDER — TRAMADOL HCL 50 MG PO TABS
50.0000 mg | ORAL_TABLET | Freq: Four times a day (QID) | ORAL | Status: DC | PRN
  Administered 2020-06-17: 50 mg via ORAL
  Filled 2020-06-16: qty 1

## 2020-06-16 MED ORDER — ENOXAPARIN SODIUM 40 MG/0.4ML ~~LOC~~ SOLN
40.0000 mg | SUBCUTANEOUS | Status: DC
  Administered 2020-06-17 – 2020-06-22 (×6): 40 mg via SUBCUTANEOUS
  Filled 2020-06-16 (×6): qty 0.4

## 2020-06-16 MED ORDER — SODIUM CHLORIDE 0.9 % IV SOLN: 2.0000 g | Freq: Once | INTRAVENOUS | Status: DC

## 2020-06-16 MED ORDER — VANCOMYCIN HCL 2000 MG/400ML IV SOLN
2000.0000 mg | Freq: Once | INTRAVENOUS | Status: AC
  Administered 2020-06-17: 2000 mg via INTRAVENOUS
  Filled 2020-06-16: qty 400

## 2020-06-16 MED ORDER — MONTELUKAST SODIUM 10 MG PO TABS
10.0000 mg | ORAL_TABLET | ORAL | Status: DC
  Administered 2020-06-17 – 2020-06-22 (×6): 10 mg via ORAL
  Filled 2020-06-16 (×6): qty 1

## 2020-06-16 MED ORDER — ALBUTEROL SULFATE (2.5 MG/3ML) 0.083% IN NEBU: 3.0000 mL | INHALATION_SOLUTION | RESPIRATORY_TRACT | Status: DC | PRN

## 2020-06-16 MED ORDER — ONDANSETRON HCL 4 MG PO TABS: 4.0000 mg | ORAL_TABLET | Freq: Four times a day (QID) | ORAL | Status: DC | PRN

## 2020-06-16 MED ORDER — ACETAMINOPHEN 500 MG PO TABS
1000.0000 mg | ORAL_TABLET | Freq: Once | ORAL | Status: AC
  Administered 2020-06-16: 1000 mg via ORAL
  Filled 2020-06-16: qty 2

## 2020-06-16 MED ORDER — ASPIRIN EC 81 MG PO TBEC
81.0000 mg | DELAYED_RELEASE_TABLET | Freq: Every day | ORAL | Status: DC
  Administered 2020-06-17 – 2020-06-22 (×6): 81 mg via ORAL
  Filled 2020-06-16 (×6): qty 1

## 2020-06-16 MED ORDER — VANCOMYCIN HCL IN DEXTROSE 1-5 GM/200ML-% IV SOLN
1000.0000 mg | Freq: Two times a day (BID) | INTRAVENOUS | Status: DC
  Filled 2020-06-16 (×2): qty 200

## 2020-06-16 MED ORDER — ONDANSETRON HCL 4 MG/2ML IJ SOLN: 4.0000 mg | Freq: Four times a day (QID) | INTRAMUSCULAR | Status: DC | PRN

## 2020-06-16 MED ORDER — LACTATED RINGERS IV SOLN: INTRAVENOUS | Status: AC

## 2020-06-16 MED ORDER — MOMETASONE FURO-FORMOTEROL FUM 200-5 MCG/ACT IN AERO
2.0000 | INHALATION_SPRAY | Freq: Two times a day (BID) | RESPIRATORY_TRACT | Status: DC
  Administered 2020-06-18 – 2020-06-22 (×8): 2 via RESPIRATORY_TRACT
  Filled 2020-06-16: qty 8.8

## 2020-06-16 MED ORDER — MORPHINE SULFATE (PF) 4 MG/ML IV SOLN
4.0000 mg | Freq: Once | INTRAVENOUS | Status: AC
  Administered 2020-06-16: 4 mg via INTRAVENOUS
  Filled 2020-06-16: qty 1

## 2020-06-16 MED ORDER — PANTOPRAZOLE SODIUM 20 MG PO TBEC
20.0000 mg | DELAYED_RELEASE_TABLET | Freq: Two times a day (BID) | ORAL | Status: DC
  Administered 2020-06-17 – 2020-06-22 (×11): 20 mg via ORAL
  Filled 2020-06-16 (×12): qty 1

## 2020-06-16 MED ORDER — VANCOMYCIN HCL IN DEXTROSE 1-5 GM/200ML-% IV SOLN: 1000.0000 mg | Freq: Once | INTRAVENOUS | Status: DC

## 2020-06-16 MED ORDER — OXYBUTYNIN CHLORIDE ER 5 MG PO TB24
15.0000 mg | ORAL_TABLET | Freq: Every day | ORAL | Status: DC
  Administered 2020-06-17 – 2020-06-22 (×6): 15 mg via ORAL
  Filled 2020-06-16: qty 1
  Filled 2020-06-16 (×6): qty 3

## 2020-06-16 MED ORDER — LACTATED RINGERS IV SOLN: INTRAVENOUS | Status: DC

## 2020-06-16 MED ORDER — METRONIDAZOLE IN NACL 5-0.79 MG/ML-% IV SOLN
500.0000 mg | Freq: Three times a day (TID) | INTRAVENOUS | Status: DC
  Administered 2020-06-17 (×2): 500 mg via INTRAVENOUS
  Filled 2020-06-16 (×2): qty 100

## 2020-06-16 MED ORDER — IOHEXOL 300 MG/ML  SOLN
100.0000 mL | Freq: Once | INTRAMUSCULAR | Status: AC | PRN
  Administered 2020-06-16: 100 mL via INTRAVENOUS

## 2020-06-16 MED ORDER — INSULIN ASPART 100 UNIT/ML ~~LOC~~ SOLN
0.0000 [IU] | Freq: Three times a day (TID) | SUBCUTANEOUS | Status: DC
  Administered 2020-06-17 – 2020-06-19 (×3): 1 [IU] via SUBCUTANEOUS
  Administered 2020-06-19: 2 [IU] via SUBCUTANEOUS
  Administered 2020-06-20: 1 [IU] via SUBCUTANEOUS
  Administered 2020-06-20 – 2020-06-21 (×3): 2 [IU] via SUBCUTANEOUS
  Administered 2020-06-21 – 2020-06-22 (×2): 1 [IU] via SUBCUTANEOUS
  Filled 2020-06-16: qty 0.09

## 2020-06-16 MED ORDER — INSULIN GLARGINE (1 UNIT DIAL) 300 UNIT/ML ~~LOC~~ SOPN: 20.0000 [IU] | PEN_INJECTOR | Freq: Every day | SUBCUTANEOUS | Status: DC

## 2020-06-16 MED ORDER — SODIUM CHLORIDE 0.9 % IV SOLN
2.0000 g | Freq: Three times a day (TID) | INTRAVENOUS | Status: DC
  Administered 2020-06-17 (×2): 2 g via INTRAVENOUS
  Filled 2020-06-16 (×2): qty 2

## 2020-06-16 NOTE — ED Triage Notes (Signed)
Per EMS, Pt woke up this morning feeling SOB, and cold. On arrival Pt found to have rm air 02 sat of 85%, and fever of 105 degrees.  Pt given IV fluids, and 1000mg  of tylenol. HX of COPD, and is fully vaccinated.

## 2020-06-16 NOTE — H&P (Signed)
History and Physical    Kelli Rivera CRF:[id number] DOB: 1951/08/12 DOA: 06/16/2020  PCP: Angelica Chessman, MD  Patient coming from: Home.  Chief Complaint: Fever chills.  HPI: Kelli Rivera is a 69 y.o. female with history of diabetes mellitus type 2, systolic dysfunction, COPD, rheumatoid arthritis presents to the ER because of fever and chills with low back pain.  Patient started having fever chills this morning.  No associated shortness of breath chest pain nausea vomiting diarrhea or dysuria.  Patient has been having low back pain for the last 3 weeks and has been taking over-the-counter medications.  Denies any incontinence of bowel or urine.  Has pain on moving her left lower extremity and movement is decreased because of the pain.  ED Course: In the ER patient was febrile with temperature 103 F tachycardic with leukocytosis lactic acid was normal.  Blood cultures were sent.  Chest x-ray UA unremarkable Covid test was negative.  CT abdomen pelvis done shows left lumbar fat-containing hernia otherwise nothing acute.  Patient started on empiric antibiotics for possible sepsis.  Source not clear.  Review of Systems: As per HPI, rest all negative.   Past Medical History:  Diagnosis Date  . Asthma   . Collagen vascular disease (HCC)   . COPD (chronic obstructive pulmonary disease) (HCC)    O2 increased to 3L while ambulating, 2L at rest as of last week  . Diabetes mellitus without complication (HCC)   . Feeling of chest tightness 12/2019  . High cholesterol   . History of leukocytosis    negative bone marrow, followed by heme q19mo  . Morbid obesity (HCC)     Past Surgical History:  Procedure Laterality Date  . ABDOMINAL HYSTERECTOMY    . APPENDECTOMY     age 49 yrs  . bladder tuck    . CHOLECYSTECTOMY       reports that she quit smoking about 14 years ago. She has a 42.00 pack-year smoking history. She has never used smokeless tobacco. She reports that she does not  drink alcohol and does not use drugs.  Allergies  Allergen Reactions  . Liraglutide Nausea And Vomiting  . Pioglitazone Swelling  . Amoxicillin-Pot Clavulanate Nausea And Vomiting    Did it involve swelling of the face/tongue/throat, SOB, or low BP? No Did it involve sudden or severe rash/hives, skin peeling, or any reaction on the inside of your mouth or nose? No Did you need to seek medical attention at a hospital or doctor's office? No When did it last happen?less than 10 years If all above answers are "NO", may proceed with cephalosporin use.     Family History  Problem Relation Age of Onset  . Heart failure Mother 53  . Lung cancer Father 22    Prior to Admission medications   Medication Sig Start Date End Date Taking? Authorizing Provider  albuterol (PROVENTIL HFA;VENTOLIN HFA) 108 (90 Base) MCG/ACT inhaler Inhale 2 puffs into the lungs every 4 (four) hours as needed for wheezing or shortness of breath. 12/17/15  Yes [provider]  aspirin EC 81 MG tablet Take 81 mg by mouth daily.   Yes [provider]  atorvastatin (LIPITOR) 80 MG tablet Take 1 tablet (80 mg total) by mouth daily at 6 PM. 01/24/20  Yes Arty Baumgartner, NP  FARXIGA 5 MG TABS tablet Take 5 mg by mouth daily. 06/04/20  Yes [provider]  fluticasone (FLONASE) 50 MCG/ACT nasal spray Place 2 sprays  into both nostrils daily as needed for allergies. 10/11/18  Yes [provider]  Fluticasone-Salmeterol (ADVAIR DISKUS) 500-50 MCG/DOSE AEPB Inhale 1 puff into the lungs 2 (two) times daily.    Yes [provider]  furosemide (LASIX) 40 MG tablet Take 1 tablet (40 mg total) by mouth daily. 10/01/17  Yes Ghimire, Werner Lean, MD  ipratropium-albuterol (DUONEB) 0.5-2.5 (3) MG/3ML SOLN Take 3 mLs by nebulization 4 (four) times daily. Patient taking differently: Take 3 mLs by nebulization every 6 (six) hours as needed (sob/wheezing).  10/01/17  Yes Ghimire, Werner Lean, MD    metFORMIN (GLUCOPHAGE) 1000 MG tablet Take 1,000 mg by mouth daily with breakfast.    Yes [provider]  montelukast (SINGULAIR) 10 MG tablet Take 10 mg by mouth every morning.  08/18/15  Yes [provider]  Multiple Vitamins-Minerals (CENTRUM SILVER 50+WOMEN PO) Take 1 tablet by mouth daily.   Yes [provider]  oxybutynin (DITROPAN XL) 15 MG 24 hr tablet Take 15 mg by mouth daily. 06/01/18  Yes [provider]  pantoprazole (PROTONIX) 20 MG tablet Take 1 tablet (20 mg total) by mouth 2 (two) times daily. 06/06/20  Yes Linwood Dibbles, MD  TOUJEO SOLOSTAR 300 UNIT/ML SOPN Inject 20 Units into the skin at bedtime.  08/31/16  Yes [provider]  traMADol (ULTRAM) 50 MG tablet Take 50 mg by mouth every 6 (six) hours as needed for pain. 01/04/20  Yes [provider]  ENBREL SURECLICK 50 MG/ML injection Inject 50 mg into the skin once a week. 01/15/20   [provider]  sucralfate (CARAFATE) 1 g tablet Take 1 tablet (1 g total) by mouth 4 (four) times daily -  with meals and at bedtime for 7 days. 06/06/20 06/13/20  Linwood Dibbles, MD  TRULICITY 1.5 MG/0.5ML SOPN Inject 1.5 mg into the skin every Thursday. 09/19/17   [provider]    Physical Exam: Constitutional: Moderately built and nourished. Vitals:   06/16/20 2030 06/16/20 2125 06/16/20 2208 06/16/20 2228  BP: (!) 119/57 (!) 106/51  (!) 105/53  Pulse: (!) 104 (!) 111  (!) 105  Resp: (!) 21 (!) 21  (!) 22  Temp:   (!) 102.3 F (39.1 C)   TempSrc:   Oral   SpO2: 90% 98%  97%  Weight:      Height:       Eyes: Anicteric no pallor. ENMT: No discharge from the ears eyes nose or mouth. Neck: No mass felt.  No neck rigidity. Respiratory: No rhonchi or crepitations. Cardiovascular: S1-S2 heard. Abdomen: Soft nontender bowel sounds present. Musculoskeletal: Pain on moving the left lower extremity.  Pain is mostly in the low back. Skin: No obvious rash. Neurologic: Alert awake  oriented to time place and person.  Moves all extremities. Psychiatric: Appears normal.  Normal affect.   Labs on Admission: I have personally reviewed following labs and imaging studies  CBC: Recent Labs  Lab 06/16/20 2012 06/16/20 2026  WBC 16.5*  --   NEUTROABS 14.8*  --   HGB 10.9* 11.9*  HCT 36.2 35.0*  MCV 81.7  --   PLT 192  --    Basic Metabolic Panel: Recent Labs  Lab 06/16/20 2012 06/16/20 2026  NA 139 142  K 4.4 5.0  CL 109 106  CO2 25  --   GLUCOSE 124* 120*  BUN 18 21  CREATININE 0.75 0.70  CALCIUM 8.3*  --    GFR: Estimated Creatinine Clearance: 81.8 mL/min (by  C-G formula based on SCr of 0.7 mg/dL). Liver Function Tests: Recent Labs  Lab 06/16/20 2012  AST 38  ALT 24  ALKPHOS 69  BILITOT 0.4  PROT 6.2*  ALBUMIN 3.0*   No results for input(s): LIPASE, AMYLASE in the last 168 hours. No results for input(s): AMMONIA in the last 168 hours. Coagulation Profile: Recent Labs  Lab 06/16/20 2012  INR 1.1   Cardiac Enzymes: No results for input(s): CKTOTAL, CKMB, CKMBINDEX, TROPONINI in the last 168 hours. BNP (last 3 results) No results for input(s): PROBNP in the last 8760 hours. HbA1C: No results for input(s): HGBA1C in the last 72 hours. CBG: No results for input(s): GLUCAP in the last 168 hours. Lipid Profile: No results for input(s): CHOL, HDL, LDLCALC, TRIG, CHOLHDL, LDLDIRECT in the last 72 hours. Thyroid Function Tests: No results for input(s): TSH, T4TOTAL, FREET4, T3FREE, THYROIDAB in the last 72 hours. Anemia Panel: No results for input(s): VITAMINB12, FOLATE, FERRITIN, TIBC, IRON, RETICCTPCT in the last 72 hours. Urine analysis:    Component Value Date/Time   COLORURINE YELLOW 06/16/2020 2120   APPEARANCEUR CLEAR 06/16/2020 2120   LABSPEC >1.046 (H) 06/16/2020 2120   PHURINE 5.0 06/16/2020 2120   GLUCOSEU NEGATIVE 06/16/2020 2120   HGBUR NEGATIVE 06/16/2020 2120   BILIRUBINUR NEGATIVE 06/16/2020 2120   KETONESUR NEGATIVE  06/16/2020 2120   PROTEINUR NEGATIVE 06/16/2020 2120   NITRITE NEGATIVE 06/16/2020 2120   LEUKOCYTESUR NEGATIVE 06/16/2020 2120   Sepsis Labs: @LABRCNTIP (procalcitonin:4,lacticidven:4) ) Recent Results (from the past 240 hour(s))  SARS Coronavirus 2 by RT PCR (hospital order, performed in [facility] hospital lab) Nasopharyngeal Nasopharyngeal Swab     Status: None   Collection Time: 06/16/20  8:18 PM   Specimen: Nasopharyngeal Swab  Result Value Ref Range Status   SARS Coronavirus 2 NEGATIVE NEGATIVE Final    Comment: (NOTE) SARS-CoV-2 target nucleic acids are NOT DETECTED.  The SARS-CoV-2 RNA is generally detectable in upper and lower respiratory specimens during the acute phase of infection. The lowest concentration of SARS-CoV-2 viral copies this assay can detect is 250 copies / mL. A negative result does not preclude SARS-CoV-2 infection and should not be used as the sole basis for treatment or other patient management decisions.  A negative result may occur with improper specimen collection / handling, submission of specimen other than nasopharyngeal swab, presence of viral mutation(s) within the areas targeted by this assay, and inadequate number of viral copies (<250 copies / mL). A negative result must be combined with clinical observations, patient history, and epidemiological information.  Fact Sheet for Patients:   06/18/20  Fact Sheet for Healthcare Providers: BoilerBrush.com.cy  This test is not yet approved or  cleared by the https://pope.com/ FDA and has been authorized for detection and/or diagnosis of SARS-CoV-2 by FDA under an Emergency Use Authorization (EUA).  This EUA will remain in effect (meaning this test can be used) for the duration of the COVID-19 declaration under Section 564(b)(1) of the Act, 21 U.S.C. section 360bbb-3(b)(1), unless the authorization is terminated or revoked sooner.  Performed  at [facility], [address], [place], [place]      Radiological Exams on Admission: CT ABDOMEN PELVIS W CONTRAST  Result Date: 06/16/2020 CLINICAL DATA:  69 year old female with left flank pain. Concern for kidney stone. EXAM: CT ABDOMEN AND PELVIS WITH CONTRAST TECHNIQUE: Multidetector CT imaging of the abdomen and pelvis was performed using the standard protocol following bolus administration of intravenous contrast. CONTRAST:  OMNIPAQUE IOHEXOL 300 MG/ML  SOLN COMPARISON:  CT abdomen pelvis dated 01/09/2018. FINDINGS: Lower chest: There are minimal bibasilar atelectasis. No intra-abdominal free air or free fluid. Hepatobiliary: The liver is unremarkable. No intrahepatic biliary dilatation. Cholecystectomy. Pancreas: Unremarkable. No pancreatic ductal dilatation or surrounding inflammatory changes. Spleen: Normal in size without focal abnormality. Adrenals/Urinary Tract: The adrenal glands unremarkable. There is no hydronephrosis on either side. There is symmetric enhancement and excretion of contrast by both kidneys. Subcentimeter left renal inferior pole hypodense focus is too small to characterize but appears similar to prior CT. The visualized ureters and urinary bladder appear unremarkable. Stomach/Bowel: There is a small hiatal hernia. There is moderate stool throughout the colon. There is no bowel obstruction or active inflammation. There is a 3.5 cm distal duodenal diverticulum. No active inflammatory changes. Appendectomy. Vascular/Lymphatic: The abdominal aorta and IVC unremarkable. No portal venous gas. There is no adenopathy. Reproductive: Hysterectomy. There is a 2 cm left adnexal cyst, similar to prior CT and likely a benign or indolent process. This can be better evaluated with ultrasound on a nonemergent/outpatient basis if clinically indicated. Other: There is a small fat containing left lumbar hernia similar to prior CT. No associated inflammatory  changes. Musculoskeletal: Degenerative changes of the spine. No acute osseous pathology. IMPRESSION: 1. No acute intra-abdominal or pelvic pathology. No hydronephrosis or nephrolithiasis. 2. Small left lumbar fat containing hernia similar to prior CT. No associated inflammatory changes. 3. Moderate colonic stool burden. No bowel obstruction. 4. A 2 cm left adnexal cyst, similar to prior CT and likely a benign or indolent process. Electronically Signed   By: Elgie Collard M.D.   On: 06/16/2020 20:06   DG Chest Port 1 View  Result Date: 06/16/2020 CLINICAL DATA:  69 year old female with questionable sepsis. EXAM: PORTABLE CHEST 1 VIEW COMPARISON:  Chest radiograph dated 06/06/2020 and CT dated 06/06/2020 FINDINGS: No focal consolidation, pleural effusion, or pneumothorax. Stable cardiac silhouette. Atherosclerotic calcification of the aortic arch. No acute osseous pathology. IMPRESSION: No active disease. Electronically Signed   By: Elgie Collard M.D.   On: 06/16/2020 19:39     Assessment/Plan Principal Problem:   SIRS (systemic inflammatory response syndrome) (HCC) Active Problems:   Diabetes mellitus type 2 in obese (HCC)   Chronic dermatitis   RA (rheumatoid arthritis) (HCC)    1. SIRS with possible sepsis source not clear given the low back pain which has recently presented we will get an MRI of the lumbar spine with and without contrast.  Presently on empiric antibiotics follow cultures. 2. Diabetes mellitus type 2 on Toujeo 20 units at bedtime which will be continued along with sliding scale coverage. 3. History of rheumatoid arthritis on Enbrel injections. 4. Anemia appears to be chronic.  Likely related to rheumatoid arthritis. 5. COPD not actively wheezing. 6. History of systolic dysfunction on Lasix presently on hold due to patient receiving fluids.  Closely monitor respiratory status.  Given the septic nature of patient's presentation will need close monitoring for any further  worsening in inpatient status.   DVT prophylaxis: Lovenox. Code Status: Full code. Family Communication: Discussed with patient. Disposition Plan: Home. Consults called: None. Admission status: Inpatient.   Eduard Clos MD [facility] Pager [phone].  If 7PM-7AM, please contact night-coverage [email or web address] Password [facility]  06/16/2020, 11:41 PM

## 2020-06-16 NOTE — ED Provider Notes (Signed)
[facility]-EMERGENCY DEPT Provider Note   CSN: [id number] Arrival date & time: 06/16/20  1901     History Chief Complaint  Patient presents with  . Shortness of Breath  . Fever    Kelli Rivera is a 69 y.o. female hx of COPD, DM, HL, here presenting with left flank pain and fever.  Patient states that she has been running a fever since this morning.  She states that she has some subjective chills and check her temperature and it was 99.  She states that she has left flank pain as well and some urinary frequency but denies any dysuria.  Patient called EMS apparently she is febrile to 105.  She was given Tylenol 1 g prior to arrival.  Patient has some subjective shortness of breath as well. Patient has a history of COPD and uses oxygen as needed. Patient was noted to be hypoxic 85% per EMS.  Patient was tested negative for Covid about a week ago when she was in the ED.  She also had a CT PE study that showed no PE during that ED visit   The history is provided by the patient.       Past Medical History:  Diagnosis Date  . Asthma   . Collagen vascular disease (HCC)   . COPD (chronic obstructive pulmonary disease) (HCC)    O2 increased to 3L while ambulating, 2L at rest as of last week  . Diabetes mellitus without complication (HCC)   . Feeling of chest tightness 12/2019  . High cholesterol   . History of leukocytosis    negative bone marrow, followed by heme q58mo  . Morbid obesity [facility])     Patient Active Problem List   Diagnosis Date Noted  . SIRS (systemic inflammatory response syndrome) (HCC) 06/16/2020  . Agatston coronary artery calcium score less than 100   . Type 2 diabetes mellitus without complication, with long-term current use of insulin (HCC)   . Chest pain   . NSTEMI (non-ST elevated myocardial infarction) (HCC) 01/22/2020  . Acute respiratory failure with hypoxia (HCC) 01/01/2019  . Chronic diastolic CHF (congestive heart failure) (HCC)  01/01/2019  . Mild renal insufficiency 01/01/2019  . Chronic dermatitis 07/06/2018  . Venous insufficiency of both lower extremities 07/06/2018  . Palmar erythema 07/06/2018  . RA (rheumatoid arthritis) (HCC) 07/06/2018  . DJD (degenerative joint disease) 07/06/2018  . OSA (obstructive sleep apnea) 07/06/2018  . Eosinophilia 07/06/2018  . GERD (gastroesophageal reflux disease) 09/24/2017  . Iron deficiency 09/24/2017  . Leukocytosis 09/23/2016  . Morbid obesity (HCC) 09/23/2016  . COPD with acute exacerbation (HCC) 09/23/2016  . Hyperlipidemia 09/23/2016  . Diabetes mellitus type 2 in obese (HCC) 09/23/2016    Past Surgical History:  Procedure Laterality Date  . ABDOMINAL HYSTERECTOMY    . APPENDECTOMY     age 24 yrs  . bladder tuck    . CHOLECYSTECTOMY       OB History   No obstetric history on file.     Family History  Problem Relation Age of Onset  . Heart failure Mother 61  . Lung cancer Father 49    Social History   Tobacco Use  . Smoking status: Former Smoker    Packs/day: 1.00    Years: 42.00    Pack years: 42.00    Quit date: 2007    Years since quitting: 14.6  . Smokeless tobacco: Never Used  Vaping Use  . Vaping Use: Never used  Substance Use Topics  . Alcohol use: No  . Drug use: No    Home Medications Prior to Admission medications   Medication Sig Start Date End Date Taking? Authorizing Provider  albuterol (PROVENTIL HFA;VENTOLIN HFA) 108 (90 Base) MCG/ACT inhaler Inhale 2 puffs into the lungs every 4 (four) hours as needed for wheezing or shortness of breath. 12/17/15  Yes [provider]  aspirin EC 81 MG tablet Take 81 mg by mouth daily.   Yes [provider]  atorvastatin (LIPITOR) 80 MG tablet Take 1 tablet (80 mg total) by mouth daily at 6 PM. 01/24/20  Yes Arty Baumgartner, NP  FARXIGA 5 MG TABS tablet Take 5 mg by mouth daily. 06/04/20  Yes [provider]  fluticasone (FLONASE) 50 MCG/ACT nasal spray Place 2  sprays into both nostrils daily as needed for allergies. 10/11/18  Yes [provider]  Fluticasone-Salmeterol (ADVAIR DISKUS) 500-50 MCG/DOSE AEPB Inhale 1 puff into the lungs 2 (two) times daily.    Yes [provider]  furosemide (LASIX) 40 MG tablet Take 1 tablet (40 mg total) by mouth daily. 10/01/17  Yes Ghimire, Werner Lean, MD  ipratropium-albuterol (DUONEB) 0.5-2.5 (3) MG/3ML SOLN Take 3 mLs by nebulization 4 (four) times daily. Patient taking differently: Take 3 mLs by nebulization every 6 (six) hours as needed (sob/wheezing).  10/01/17  Yes Ghimire, Werner Lean, MD  metFORMIN (GLUCOPHAGE) 1000 MG tablet Take 1,000 mg by mouth daily with breakfast.    Yes [provider]  montelukast (SINGULAIR) 10 MG tablet Take 10 mg by mouth every morning.  08/18/15  Yes [provider]  Multiple Vitamins-Minerals (CENTRUM SILVER 50+WOMEN PO) Take 1 tablet by mouth daily.   Yes [provider]  oxybutynin (DITROPAN XL) 15 MG 24 hr tablet Take 15 mg by mouth daily. 06/01/18  Yes [provider]  pantoprazole (PROTONIX) 20 MG tablet Take 1 tablet (20 mg total) by mouth 2 (two) times daily. 06/06/20  Yes Linwood Dibbles, MD  TOUJEO SOLOSTAR 300 UNIT/ML SOPN Inject 20 Units into the skin at bedtime.  08/31/16  Yes [provider]  traMADol (ULTRAM) 50 MG tablet Take 50 mg by mouth every 6 (six) hours as needed for pain. 01/04/20  Yes [provider]  ENBREL SURECLICK 50 MG/ML injection Inject 50 mg into the skin once a week. 01/15/20   [provider]  sucralfate (CARAFATE) 1 g tablet Take 1 tablet (1 g total) by mouth 4 (four) times daily -  with meals and at bedtime for 7 days. 06/06/20 06/13/20  Linwood Dibbles, MD  TRULICITY 1.5 MG/0.5ML SOPN Inject 1.5 mg into the skin every Thursday. 09/19/17   [provider]    Allergies    Liraglutide, Pioglitazone, and Amoxicillin-pot clavulanate  Review of Systems   Review of Systems    Constitutional: Positive for fever.  Respiratory: Positive for shortness of breath.   All other systems reviewed and are negative.   Physical Exam Updated Vital Signs BP (!) 105/53   Pulse (!) 105   Temp (!) 102.3 F (39.1 C) (Oral)   Resp (!) 22   Ht  (1.6 m)   Wt 119.3 kg   SpO2 97%   BMI 46.59 kg/m   Physical Exam Vitals and nursing note reviewed.  Constitutional:      Comments: Uncomfortable  HENT:     Head: Normocephalic.     Mouth/Throat:     Pharynx: Oropharynx is clear.  Eyes:  Extraocular Movements: Extraocular movements intact.     Pupils: Pupils are equal, round, and reactive to light.  Cardiovascular:     Rate and Rhythm: Regular rhythm. Tachycardia present.  Pulmonary:     Comments: Slightly tachypneic, diminished in bilateral bases. Abdominal:     Comments: + Left CVA tenderness  Musculoskeletal:        General: Normal range of motion.     Cervical back: Normal range of motion and neck supple.  Skin:    General: Skin is warm.     Capillary Refill: Capillary refill takes less than 2 seconds.  Neurological:     General: No focal deficit present.     Mental Status: She is oriented to person, place, and time.  Psychiatric:        Mood and Affect: Mood normal.        Behavior: Behavior normal.     ED Results / Procedures / Treatments   Labs (all labs ordered are listed, but only abnormal results are displayed) Labs Reviewed  COMPREHENSIVE METABOLIC PANEL - Abnormal; Notable for the following components:      Result Value   Glucose, Bld 124 (*)    Calcium 8.3 (*)    Total Protein 6.2 (*)    Albumin 3.0 (*)    All other components within normal limits  CBC WITH DIFFERENTIAL/PLATELET - Abnormal; Notable for the following components:   WBC 16.5 (*)    Hemoglobin 10.9 (*)    MCH 24.6 (*)    RDW 17.0 (*)    Neutro Abs 14.8 (*)    Abs Immature Granulocytes 0.11 (*)    All other components within normal limits  URINALYSIS, ROUTINE W  REFLEX MICROSCOPIC - Abnormal; Notable for the following components:   Specific Gravity, Urine >1.046 (*)    All other components within normal limits  I-STAT CHEM 8, ED - Abnormal; Notable for the following components:   Glucose, Bld 120 (*)    Hemoglobin 11.9 (*)    HCT 35.0 (*)    All other components within normal limits  SARS CORONAVIRUS 2 BY RT PCR (HOSPITAL ORDER, PERFORMED IN [facility])  CULTURE, BLOOD (ROUTINE X 2)  CULTURE, BLOOD (ROUTINE X 2)  URINE CULTURE  LACTIC ACID, PLASMA  PROTIME-INR  APTT  LACTIC ACID, PLASMA    EKG None  Radiology CT ABDOMEN PELVIS W CONTRAST  Result Date: 06/16/2020 CLINICAL DATA:  69 year old female with left flank pain. Concern for kidney stone. EXAM: CT ABDOMEN AND PELVIS WITH CONTRAST TECHNIQUE: Multidetector CT imaging of the abdomen and pelvis was performed using the standard protocol following bolus administration of intravenous contrast. CONTRAST:  OMNIPAQUE IOHEXOL 300 MG/ML  SOLN COMPARISON:  CT abdomen pelvis dated 01/09/2018. FINDINGS: Lower chest: There are minimal bibasilar atelectasis. No intra-abdominal free air or free fluid. Hepatobiliary: The liver is unremarkable. No intrahepatic biliary dilatation. Cholecystectomy. Pancreas: Unremarkable. No pancreatic ductal dilatation or surrounding inflammatory changes. Spleen: Normal in size without focal abnormality. Adrenals/Urinary Tract: The adrenal glands unremarkable. There is no hydronephrosis on either side. There is symmetric enhancement and excretion of contrast by both kidneys. Subcentimeter left renal inferior pole hypodense focus is too small to characterize but appears similar to prior CT. The visualized ureters and urinary bladder appear unremarkable. Stomach/Bowel: There is a small hiatal hernia. There is moderate stool throughout the colon. There is no bowel obstruction or active inflammation. There is a 3.5 cm distal duodenal diverticulum. No active  inflammatory changes. Appendectomy.  Vascular/Lymphatic: The abdominal aorta and IVC unremarkable. No portal venous gas. There is no adenopathy. Reproductive: Hysterectomy. There is a 2 cm left adnexal cyst, similar to prior CT and likely a benign or indolent process. This can be better evaluated with ultrasound on a nonemergent/outpatient basis if clinically indicated. Other: There is a small fat containing left lumbar hernia similar to prior CT. No associated inflammatory changes. Musculoskeletal: Degenerative changes of the spine. No acute osseous pathology. IMPRESSION: 1. No acute intra-abdominal or pelvic pathology. No hydronephrosis or nephrolithiasis. 2. Small left lumbar fat containing hernia similar to prior CT. No associated inflammatory changes. 3. Moderate colonic stool burden. No bowel obstruction. 4. A 2 cm left adnexal cyst, similar to prior CT and likely a benign or indolent process. Electronically Signed   By: Elgie Collard M.D.   On: 06/16/2020 20:06   DG Chest Port 1 View  Result Date: 06/16/2020 CLINICAL DATA:  69 year old female with questionable sepsis. EXAM: PORTABLE CHEST 1 VIEW COMPARISON:  Chest radiograph dated 06/06/2020 and CT dated 06/06/2020 FINDINGS: No focal consolidation, pleural effusion, or pneumothorax. Stable cardiac silhouette. Atherosclerotic calcification of the aortic arch. No acute osseous pathology. IMPRESSION: No active disease. Electronically Signed   By: Elgie Collard M.D.   On: 06/16/2020 19:39    Procedures Procedures (including critical care time)  Medications Ordered in ED Medications  lactated ringers infusion (has no administration in time range)  lactated ringers bolus 1,000 mL (0 mLs Intravenous Stopped 06/16/20 2209)    And  lactated ringers bolus 1,000 mL (0 mLs Intravenous Stopped 06/16/20 2132)    And  lactated ringers bolus 1,000 mL (1,000 mLs Intravenous New Bag/Given 06/16/20 2209)    And  lactated ringers bolus 1,000 mL (has no  administration in time range)  cefTRIAXone (ROCEPHIN) 1 g in sodium chloride 0.9 % 100 mL IVPB (0 g Intravenous Stopped 06/16/20 2120)  acetaminophen (TYLENOL) tablet 1,000 mg (has no administration in time range)  iohexol (OMNIPAQUE) 300 MG/ML solution 100 mL (100 mLs Intravenous Contrast Given 06/16/20 1939)  morphine 4 MG/ML injection 4 mg (4 mg Intravenous Given 06/16/20 2133)    ED Course  I have reviewed the triage vital signs and the nursing notes.  Pertinent labs & imaging results that were available during my care of the patient were reviewed by me and considered in my medical decision making (see chart for details).    MDM Rules/Calculators/A&P                          ARRION BURRUEL is a 69 y.o. female presenting with left flank pain and fever and shortness of breath.  Patient's oxygen level was slightly low.  EMS but she wears oxygen as needed.  She definitely has left CVA tenderness on exam.  She is also febrile per EMS.  Will do sepsis work-up and likely source is pyelonephritis.  Will get CBC, CMP, lactate, cultures, urinalysis.  Will give 30 cc/kg bolus and give empiric antibiotics for UTI.  10:28 PM UA nl.  White blood cell count is elevated at 17.  Patient is still febrile and tachycardic.  Her chest x-ray is clear and CT did not show any Pyelo or stone.  Will admit for sepsis of unclear etiology   Final Clinical Impression(s) / ED Diagnoses Final diagnoses:  None    Rx / DC Orders ED Discharge Orders    None       Charlynne Pander,  MD 06/16/20 2229

## 2020-06-17 ENCOUNTER — Inpatient Hospital Stay (HOSPITAL_COMMUNITY): Payer: Medicare Other

## 2020-06-17 DIAGNOSIS — R7881 Bacteremia: Secondary | ICD-10-CM | POA: Diagnosis present

## 2020-06-17 LAB — GLUCOSE, CAPILLARY
Glucose-Capillary: 82 mg/dL (ref 70–99)
Glucose-Capillary: 80 mg/dL (ref 70–99)

## 2020-06-17 LAB — CBC WITH DIFFERENTIAL/PLATELET
Abs Immature Granulocytes: 0.09 10*3/uL — ABNORMAL HIGH (ref 0.00–0.07)
Basophils Absolute: 0.1 10*3/uL (ref 0.0–0.1)
Basophils Relative: 0 %
Eosinophils Absolute: 0 10*3/uL (ref 0.0–0.5)
Eosinophils Relative: 0 %
HCT: 32.1 % — ABNORMAL LOW (ref 36.0–46.0)
Hemoglobin: 9.8 g/dL — ABNORMAL LOW (ref 12.0–15.0)
Immature Granulocytes: 1 %
Lymphocytes Relative: 5 %
Lymphs Abs: 0.7 10*3/uL (ref 0.7–4.0)
MCH: 25 pg — ABNORMAL LOW (ref 26.0–34.0)
MCHC: 30.5 g/dL (ref 30.0–36.0)
MCV: 81.9 fL (ref 80.0–100.0)
Monocytes Absolute: 0.4 10*3/uL (ref 0.1–1.0)
Monocytes Relative: 3 %
Neutro Abs: 12.4 10*3/uL — ABNORMAL HIGH (ref 1.7–7.7)
Neutrophils Relative %: 91 %
Platelets: 152 10*3/uL (ref 150–400)
RBC: 3.92 MIL/uL (ref 3.87–5.11)
RDW: 17.2 % — ABNORMAL HIGH (ref 11.5–15.5)
WBC: 13.6 10*3/uL — ABNORMAL HIGH (ref 4.0–10.5)
nRBC: 0 % (ref 0.0–0.2)

## 2020-06-17 LAB — BLOOD CULTURE ID PANEL (REFLEXED) - BCID2

## 2020-06-17 LAB — COMPREHENSIVE METABOLIC PANEL
ALT: 36 U/L (ref 0–44)
AST: 58 U/L — ABNORMAL HIGH (ref 15–41)
Albumin: 2.6 g/dL — ABNORMAL LOW (ref 3.5–5.0)
Alkaline Phosphatase: 66 U/L (ref 38–126)
Anion gap: 6 (ref 5–15)
BUN: 16 mg/dL (ref 8–23)
CO2: 23 mmol/L (ref 22–32)
Calcium: 7.8 mg/dL — ABNORMAL LOW (ref 8.9–10.3)
Chloride: 106 mmol/L (ref 98–111)
Creatinine, Ser: 0.62 mg/dL (ref 0.44–1.00)
GFR calc Af Amer: >60 mL/min (ref >60)
GFR calc non Af Amer: >60 mL/min (ref >60)
Glucose, Bld: 154 mg/dL — ABNORMAL HIGH (ref 70–99)
Potassium: 4 mmol/L (ref 3.5–5.1)
Sodium: 135 mmol/L (ref 135–145)
Total Bilirubin: 0.7 mg/dL (ref 0.3–1.2)
Total Protein: 5.3 g/dL — ABNORMAL LOW (ref 6.5–8.1)

## 2020-06-17 LAB — CBC
HCT: 35 % — ABNORMAL LOW (ref 36.0–46.0)
Hemoglobin: 10.4 g/dL — ABNORMAL LOW (ref 12.0–15.0)
MCH: 24.8 pg — ABNORMAL LOW (ref 26.0–34.0)
MCHC: 29.7 g/dL — ABNORMAL LOW (ref 30.0–36.0)
MCV: 83.5 fL (ref 80.0–100.0)
Platelets: 146 10*3/uL — ABNORMAL LOW (ref 150–400)
RBC: 4.19 MIL/uL (ref 3.87–5.11)
RDW: 17.2 % — ABNORMAL HIGH (ref 11.5–15.5)
WBC: 13.1 10*3/uL — ABNORMAL HIGH (ref 4.0–10.5)
nRBC: 0 % (ref 0.0–0.2)

## 2020-06-17 LAB — CREATININE, SERUM
Creatinine, Ser: 0.77 mg/dL (ref 0.44–1.00)
GFR calc Af Amer: >60 mL/min (ref >60)
GFR calc non Af Amer: >60 mL/min (ref >60)

## 2020-06-17 LAB — CBG MONITORING, ED
Glucose-Capillary: 113 mg/dL — ABNORMAL HIGH (ref 70–99)
Glucose-Capillary: 138 mg/dL — ABNORMAL HIGH (ref 70–99)
Glucose-Capillary: 93 mg/dL (ref 70–99)

## 2020-06-17 LAB — LACTIC ACID, PLASMA
Lactic Acid, Venous: 1 mmol/L (ref 0.5–1.9)
Lactic Acid, Venous: 1.6 mmol/L (ref 0.5–1.9)

## 2020-06-17 LAB — PROCALCITONIN: Procalcitonin: 21.72 ng/mL

## 2020-06-17 MED ORDER — IBUPROFEN 100 MG/5ML PO SUSP
100.0000 mg | Freq: Three times a day (TID) | ORAL | Status: DC | PRN
  Administered 2020-06-17 – 2020-06-19 (×2): 100 mg via ORAL
  Filled 2020-06-17 (×3): qty 5

## 2020-06-17 MED ORDER — ACETAMINOPHEN 325 MG PO TABS
650.0000 mg | ORAL_TABLET | Freq: Four times a day (QID) | ORAL | Status: DC | PRN
  Administered 2020-06-17 – 2020-06-18 (×3): 650 mg via ORAL
  Filled 2020-06-17 (×3): qty 2

## 2020-06-17 MED ORDER — INSULIN GLARGINE 100 UNIT/ML ~~LOC~~ SOLN
20.0000 [IU] | Freq: Every day | SUBCUTANEOUS | Status: DC
  Administered 2020-06-17 – 2020-06-21 (×5): 20 [IU] via SUBCUTANEOUS
  Filled 2020-06-17 (×7): qty 0.2

## 2020-06-17 MED ORDER — HYDROCODONE-ACETAMINOPHEN 7.5-325 MG PO TABS
1.0000 | ORAL_TABLET | Freq: Four times a day (QID) | ORAL | Status: DC | PRN
  Administered 2020-06-17 – 2020-06-21 (×6): 2 via ORAL
  Filled 2020-06-17 (×7): qty 2

## 2020-06-17 MED ORDER — LIDOCAINE 5 % EX PTCH
2.0000 | MEDICATED_PATCH | CUTANEOUS | Status: DC
  Administered 2020-06-17 – 2020-06-22 (×6): 2 via TRANSDERMAL
  Filled 2020-06-17 (×6): qty 2

## 2020-06-17 MED ORDER — SODIUM CHLORIDE 0.9 % IV SOLN
2.0000 g | INTRAVENOUS | Status: DC
  Administered 2020-06-17 – 2020-06-18 (×2): 2 g via INTRAVENOUS
  Filled 2020-06-17: qty 2
  Filled 2020-06-17: qty 20

## 2020-06-17 MED ORDER — GADOBUTROL 1 MMOL/ML IV SOLN
10.0000 mL | Freq: Once | INTRAVENOUS | Status: AC | PRN
  Administered 2020-06-17: 10 mL via INTRAVENOUS

## 2020-06-17 MED ORDER — LORAZEPAM 1 MG PO TABS: 1.0000 mg | ORAL_TABLET | ORAL | Status: DC | PRN

## 2020-06-17 MED ORDER — IBUPROFEN 100 MG PO CHEW
100.0000 mg | CHEWABLE_TABLET | Freq: Three times a day (TID) | ORAL | Status: DC | PRN
  Filled 2020-06-17: qty 1

## 2020-06-17 NOTE — Progress Notes (Signed)
PHARMACY - PHYSICIAN COMMUNICATION CRITICAL VALUE ALERT - BLOOD CULTURE IDENTIFICATION (BCID)  Kelli Rivera is an 69 y.o. female who presented to [facility] on 06/16/2020 with a chief complaint of fever and chills.  She was started on broad abx with cefepime, vancomycin and flagyl for sepsis.  Name of physician (or Provider) Contacted: Dr. Gerri Lins  Current antibiotics: vancomycin, cefepime and flagyl  Changes to prescribed antibiotics recommended:  - change abx to ceftriaxone 2gm IV q24h  Results for orders placed or performed during the hospital encounter of 06/16/20  Blood Culture ID Panel (Reflexed) (Collected: 06/16/2020  8:00 PM)  Result Value Ref Range   Enterococcus faecalis NOT DETECTED NOT DETECTED   Enterococcus Faecium NOT DETECTED NOT DETECTED   Listeria monocytogenes NOT DETECTED NOT DETECTED   Staphylococcus species NOT DETECTED NOT DETECTED   Staphylococcus aureus (BCID) NOT DETECTED NOT DETECTED   Staphylococcus epidermidis NOT DETECTED NOT DETECTED   Staphylococcus lugdunensis NOT DETECTED NOT DETECTED   Streptococcus species DETECTED (A) NOT DETECTED   Streptococcus agalactiae DETECTED (A) NOT DETECTED   Streptococcus pneumoniae NOT DETECTED NOT DETECTED   Streptococcus pyogenes NOT DETECTED NOT DETECTED   A.calcoaceticus-baumannii NOT DETECTED NOT DETECTED   Bacteroides fragilis NOT DETECTED NOT DETECTED   Enterobacterales NOT DETECTED NOT DETECTED   Enterobacter cloacae complex NOT DETECTED NOT DETECTED   Escherichia coli NOT DETECTED NOT DETECTED   Klebsiella aerogenes NOT DETECTED NOT DETECTED   Klebsiella oxytoca NOT DETECTED NOT DETECTED   Klebsiella pneumoniae NOT DETECTED NOT DETECTED   Proteus species NOT DETECTED NOT DETECTED   Salmonella species NOT DETECTED NOT DETECTED   Serratia marcescens NOT DETECTED NOT DETECTED   Haemophilus influenzae NOT DETECTED NOT DETECTED   Neisseria meningitidis NOT DETECTED NOT DETECTED   Pseudomonas aeruginosa NOT  DETECTED NOT DETECTED   Stenotrophomonas maltophilia NOT DETECTED NOT DETECTED   Candida albicans NOT DETECTED NOT DETECTED   Candida auris NOT DETECTED NOT DETECTED   Candida glabrata NOT DETECTED NOT DETECTED   Candida krusei NOT DETECTED NOT DETECTED   Candida parapsilosis NOT DETECTED NOT DETECTED   Candida tropicalis NOT DETECTED NOT DETECTED   Cryptococcus neoformans/gattii NOT DETECTED NOT DETECTED    Dorna Leitz P 06/17/2020  9:24 AM

## 2020-06-17 NOTE — Progress Notes (Signed)
This note also relates to the following rows which could not be included: ECG Heart Rate - Cannot attach notes to unvalidated device data Resp - Cannot attach notes to unvalidated device data    06/17/20 2015  Assess: MEWS Score  Temp (!) 103.1 F (39.5 C)  BP (!) 102/47  Pulse Rate 86  Level of Consciousness Alert  SpO2 98 %  O2 Device Nasal Cannula  Patient Activity (if Appropriate) In bed  O2 Flow Rate (L/min) 2 L/min  Assess: MEWS Score  MEWS Temp 2  MEWS Systolic 0  MEWS Pulse 0  MEWS RR 0  MEWS LOC 0  MEWS Score 2  MEWS Score Color Yellow  Assess: if the MEWS score is Yellow or Red  Were vital signs taken at a resting state? Yes  Focused Assessment No change from prior assessment  Early Detection of Sepsis Score *See Row Information* High  MEWS guidelines implemented *See Row Information* Yes  Treat  MEWS Interventions Other (Comment) (ice packs applied under both arms, and between legs)  Take Vital Signs  Increase Vital Sign Frequency  Yellow: Q 2hr X 2 then Q 4hr X 2, if remains yellow, continue Q 4hrs  Escalate  MEWS: Escalate Yellow: discuss with charge nurse/RN and consider discussing with provider and RRT  Notify: Charge Nurse/RN  Name of Charge Nurse/RN Notified Vera  Date Charge Nurse/RN Notified 06/17/20  Time Charge Nurse/RN Notified 2030  Notify: Provider  Provider Name/Title M. Katherina Right  Date Provider Notified 06/17/20  Time Provider Notified 2023  Notification Type Page  Notification Reason Other (Comment) (elevated temp, low BP)  Response See new orders  Date of Provider Response 06/17/20  Time of Provider Response 2133 (on 2nd attempt)  Document  Patient Outcome Other (Comment)  Progress note created (see row info) Yes   Patient temp is 103.1 and BP 102/47. No changes in the patients assessment. Patient deny pain and showing no signs and symptoms of discomfort or distress. MD notified at 2023 with 2nd attempt at 2129. Interventions was placing  2 ice packs under both arms and 2 ice packs between the legs until a response from the on-call attending. Dwana Curd, charge nurse notified. Yellow MEWS protocol initiated and will continue to monitor the patient.

## 2020-06-17 NOTE — ED Notes (Signed)
Meal provided to patient

## 2020-06-17 NOTE — Progress Notes (Signed)
Patient arrives to room 1523 at this time via stretcher from ED.  Patient required max assist from stretcher to bed.  Patient oriented to callbell use with stated understanding.

## 2020-06-17 NOTE — Progress Notes (Signed)
   06/17/20 2207  Assess: MEWS Score  Temp (!) 103 F (39.4 C)  BP (!) 104/49  Pulse Rate 86  Resp 20  Level of Consciousness Alert  SpO2 97 %  Assess: MEWS Score  MEWS Temp 2  MEWS Systolic 0  MEWS Pulse 0  MEWS RR 0  MEWS LOC 0  MEWS Score 2  MEWS Score Color Yellow  Assess: if the MEWS score is Yellow or Red  Were vital signs taken at a resting state? Yes  Focused Assessment No change from prior assessment  Early Detection of Sepsis Score *See Row Information* High  MEWS guidelines implemented *See Row Information*  (previously yellow MEWS, next VS will be in 2 hours)  Treat  MEWS Interventions Administered prn meds/treatments  Pain Scale 0-10  Pain Score 0  Take Vital Signs  Increase Vital Sign Frequency  Yellow: Q 2hr X 2 then Q 4hr X 2, if remains yellow, continue Q 4hrs  Escalate  MEWS: Escalate  Dwana Curd, charge nurse updated)  Notify: Provider  Response See new orders   Patient's temp remain elevated, spoke with on-call attending M. Denny via telephone. Patients temp came down to 101 with the use of ice packs. Temp is now back to 103. Ibuprofen suspension administered by mouth. Continue to monitor BP, no interventions at this time. Yellow MEWS protocol will continue and will continue to monitor the patient.

## 2020-06-17 NOTE — ED Notes (Signed)
Returned from MRI 

## 2020-06-17 NOTE — Progress Notes (Signed)
PROGRESS NOTE  Kelli Rivera EYC:[id number] DOB: 1951-08-31 DOA: 06/16/2020 PCP: Angelica Chessman, MD  Brief History   Kelli Rivera is a 69 y.o. female with history of diabetes mellitus type 2, systolic dysfunction, COPD, rheumatoid arthritis presents to the ER because of fever and chills with low back pain.  Patient started having fever chills this morning.  No associated shortness of breath chest pain nausea vomiting diarrhea or dysuria.  Patient has been having low back pain for the last 3 weeks and has been taking over-the-counter medications.  Denies any incontinence of bowel or urine.  Has pain on moving her left lower extremity and movement is decreased because of the pain.  ED Course: In the ER patient was febrile with temperature 103 F tachycardic with leukocytosis lactic acid was normal.  Blood cultures were sent.  Chest x-ray UA unremarkable Covid test was negative.  CT abdomen pelvis done shows left lumbar fat-containing hernia otherwise nothing acute.  Patient started on empiric antibiotics for possible sepsis.  Source not clear.  Blood culture x 2 positive for strep agalactiae.   MRI of the Lumbar Spine:  1. Lumbar spondylosis and degenerative disc disease causing mild bilateral impingement at L4-5, mildly worsened from 03/29/2017. 2. Left facet and perifacet edema and low-grade enhancement at the L3-4 level is notably increased from 03/29/2017, but probably due to degenerative facet arthropathy given the morphology of the facets. There is no overt facet joint effusion and hence I am skeptical of infected left facet joint at L3-4 as a cause for the appearance. There is no drainable collection in this vicinity. 3. The broad left transverse process of L5 appears to mildly pseudo articulate with the sacrum, but there is no obvious edema signal to suggest Bertolotti syndrome.  The patient is awaiting an inpatient bed. She is receiving IV Rocephin, pain control, and ID has been  consulted.  Consultants  . Infectious disease  Procedures  . None  Antibiotics   Anti-infectives (From admission, onward)   Start     Dose/Rate Route Frequency Ordered Stop   06/17/20 1000  vancomycin (VANCOCIN) IVPB 1000 mg/200 mL premix  Status:  Discontinued        1,000 mg 200 mL/hr over 60 Minutes Intravenous Every 12 hours 06/16/20 2352 06/17/20 0927   06/17/20 1000  cefTRIAXone (ROCEPHIN) 2 g in sodium chloride 0.9 % 100 mL IVPB        2 g 200 mL/hr over 30 Minutes Intravenous Every 24 hours 06/17/20 0927     06/17/20 0000  metroNIDAZOLE (FLAGYL) IVPB 500 mg  Status:  Discontinued        500 mg 100 mL/hr over 60 Minutes Intravenous Every 8 hours 06/16/20 2340 06/17/20 0927   06/17/20 0000  vancomycin (VANCOREADY) IVPB 2000 mg/400 mL        2,000 mg 200 mL/hr over 120 Minutes Intravenous  Once 06/16/20 2352 06/17/20 0325   06/17/20 0000  ceFEPIme (MAXIPIME) 2 g in sodium chloride 0.9 % 100 mL IVPB  Status:  Discontinued        2 g 200 mL/hr over 30 Minutes Intravenous Every 8 hours 06/16/20 2352 06/17/20 0927   06/16/20 2345  ceFEPIme (MAXIPIME) 2 g in sodium chloride 0.9 % 100 mL IVPB  Status:  Discontinued        2 g 200 mL/hr over 30 Minutes Intravenous  Once 06/16/20 2340 06/16/20 2347   06/16/20 2345  vancomycin (VANCOCIN) IVPB 1000 mg/200 mL premix  Status:  Discontinued        1,000 mg 200 mL/hr over 60 Minutes Intravenous  Once 06/16/20 2340 06/16/20 2347   06/16/20 1930  cefTRIAXone (ROCEPHIN) 1 g in sodium chloride 0.9 % 100 mL IVPB  Status:  Discontinued        1 g 200 mL/hr over 30 Minutes Intravenous Every 24 hours 06/16/20 1925 06/16/20 2340    .  Subjective  The patient is sitting up in bed. She is complaining of back pain.  Objective   Vitals:  Vitals:   06/17/20 1530 06/17/20 1600  BP: (!) 110/49 (!) 108/53  Pulse: 83 81  Resp:  15  Temp: (!) 102 F (38.9 C)   SpO2: 96% 93%   Exam:  Constitutional:  . The patient is awake, alert, and  oriented x 3. Moderate distress from low back pain. Respiratory:  . No increased work of breathing. . No wheezes, rales, or rhonchi . No tactile fremitus Cardiovascular:  . Regular rate and rhythm . No murmurs, ectopy, or gallups. . No lateral PMI. No thrills. Abdomen:  . Abdomen is Morbidly obese . It is soft, non-tender, non-distended . I am unable to evaluate the abdomen for hernias, masses, or organomegaly due to the patient's body habitus.  . Bowel sounds are distant.  Musculoskeletal:  . No cyanosis, clubbing, or edema Skin:  . No rashes, lesions, ulcers . palpation of skin: no induration or nodules Neurologic:  . CN 2-12 intact . Sensation all 4 extremities intact Psychiatric:  . Mental status o Mood, affect appropriate o Orientation to person, place, time  . judgment and insight appear intact  I have personally reviewed the following:   Today's Data  . Vitals, CMP, CBC, lactic acid, procalcitonin  Micro Data  . Blood cultures x 2 positive for strep agalactiae.  Imaging  . MRI Lumbar spine.  Scheduled Meds: . aspirin EC  81 mg Oral Daily  . atorvastatin  80 mg Oral q1800  . enoxaparin (LOVENOX) injection  40 mg Subcutaneous Q24H  . insulin aspart  0-9 Units Subcutaneous TID WC  . insulin glargine  20 Units Subcutaneous QHS  . lidocaine  2 patch Transdermal Q24H  . mometasone-formoterol  2 puff Inhalation BID  . montelukast  10 mg Oral BH-[id number]  . oxybutynin  15 mg Oral Daily  . pantoprazole  20 mg Oral BID   Continuous Infusions: . cefTRIAXone (ROCEPHIN)  IV Stopped (06/17/20 1612)    Principal Problem:   SIRS (systemic inflammatory response syndrome) (HCC) Active Problems:   Diabetes mellitus type 2 in obese (HCC)   Chronic dermatitis   RA (rheumatoid arthritis) (HCC)   LOS: 1 day   A & P  Sepsis: Pt presented with fever 102.1, hypotension, and tachypnea with leukocytosis of 16.5 and 2/2 blood cultures positive for strep agalactiae. Source of  bacteremia not clear. MRI of lumbar spine not clearly positive for osteomyelitis, although not normal. Pt denies dental issues. Urinalysis is negative for UTI. The patient is receiving Rocephin 2 gm IV daily. Infectious disease has been consulted.   Diabetes mellitus type 2 on Toujeo 20 units at bedtime which will be continued along with sliding scale coverage. Glucoses have been 93 - 138 over the past 24 hours.  Immunosuppressed State: History of rheumatoid arthritis on Enbrel injections.  Anemia appears to be chronic. Likely related to rheumatoid arthritis. Will check anemia panel.  COPD: Noted and stable. The patient is not actively wheezing. Beta agonists available on an as  needed basis.  HFrEF: The patient's most recent echocardiogram was on 01/23/2020. EF at that time was 40-45%. With global hypokinesis of the left ventricle. Currently her Lasix is held due to low blood pressures. Monitor volume stasis, creatinine, and respiratory status.  I have seen and examined this patient myself. I have spent 35 minutes in her evaluation and care.  DVT prophylaxis: Lovenox. Code Status: Full code. Family Communication: Discussed with patient. Disposition Plan: Home. Status is: Inpatient  Remains inpatient appropriate because:IV treatments appropriate due to intensity of illness or inability to take PO   Dispo: The patient is from: Home              Anticipated d/c is to: Home              Anticipated d/c date is: 3 days              Patient currently is not medically stable to d/c.   Quintavius Niebuhr, DO [facility] Direct contact: see [email or web address]  7PM-7AM contact night coverage as above 06/17/2020, 4:46 PM  LOS: 1 day

## 2020-06-17 NOTE — Progress Notes (Signed)
Pharmacy Antibiotic Note  Kelli Rivera is a 69 y.o. female admitted on 06/16/2020 with sepsis.  Pharmacy has been consulted for vancomycin and cefepime dosing.  Plan:  Cefepime 2 gr IV q8h    Vancomycin 2000 mg IV x1, then 1000 mg IV q12h   Metronidazole 500 mg IV q8h   Height: 5\' 3"  (160 cm) Weight: 119.3 kg (263 lb) IBW/kg (Calculated) : 52.4  Temp (24hrs), Avg:101.2 F (38.4 C), Min:99 F (37.2 C), Max:102.3 F (39.1 C)  Recent Labs  Lab 06/16/20 2012 06/16/20 2026 06/17/20 0030  WBC 16.5*  --  13.6*  CREATININE 0.75 0.70 0.62  LATICACIDVEN 1.6  --  1.0    Estimated Creatinine Clearance: 81.8 mL/min (by C-G formula based on SCr of 0.62 mg/dL).    Allergies  Allergen Reactions  . Liraglutide Nausea And Vomiting  . Pioglitazone Swelling  . Amoxicillin-Pot Clavulanate Nausea And Vomiting    Did it involve swelling of the face/tongue/throat, SOB, or low BP? No Did it involve sudden or severe rash/hives, skin peeling, or any reaction on the inside of your mouth or nose? No Did you need to seek medical attention at a hospital or doctor's office? No When did it last happen?less than 10 years If all above answers are "NO", may proceed with cephalosporin use.     Antimicrobials this admission: 8/24 cefepime >>  8/24 vancomycin >>  8/24 metronidazole >>   Dose adjustments this admission:   Microbiology results: 8/23 BCx:  8/23 COVID-19: negative 8/23 UCx:      Thank you for allowing pharmacy to be a part of this patient's care.   9/23, PharmD, BCPS 06/17/2020 2:09 AM

## 2020-06-18 ENCOUNTER — Inpatient Hospital Stay (HOSPITAL_COMMUNITY): Payer: Medicare Other

## 2020-06-18 DIAGNOSIS — R7881 Bacteremia: Secondary | ICD-10-CM

## 2020-06-18 DIAGNOSIS — B951 Streptococcus, group B, as the cause of diseases classified elsewhere: Secondary | ICD-10-CM

## 2020-06-18 DIAGNOSIS — R52 Pain, unspecified: Secondary | ICD-10-CM

## 2020-06-18 DIAGNOSIS — L309 Dermatitis, unspecified: Secondary | ICD-10-CM

## 2020-06-18 DIAGNOSIS — E119 Type 2 diabetes mellitus without complications: Secondary | ICD-10-CM

## 2020-06-18 DIAGNOSIS — M069 Rheumatoid arthritis, unspecified: Secondary | ICD-10-CM

## 2020-06-18 DIAGNOSIS — M79609 Pain in unspecified limb: Secondary | ICD-10-CM

## 2020-06-18 DIAGNOSIS — Z794 Long term (current) use of insulin: Secondary | ICD-10-CM

## 2020-06-18 LAB — BASIC METABOLIC PANEL
Anion gap: 6 (ref 5–15)
BUN: 15 mg/dL (ref 8–23)
CO2: 25 mmol/L (ref 22–32)
Calcium: 8.2 mg/dL — ABNORMAL LOW (ref 8.9–10.3)
Chloride: 106 mmol/L (ref 98–111)
Creatinine, Ser: 0.82 mg/dL (ref 0.44–1.00)
GFR calc Af Amer: >60 mL/min (ref >60)
GFR calc non Af Amer: >60 mL/min (ref >60)
Glucose, Bld: 97 mg/dL (ref 70–99)
Potassium: 4.3 mmol/L (ref 3.5–5.1)
Sodium: 137 mmol/L (ref 135–145)

## 2020-06-18 LAB — CBC WITH DIFFERENTIAL/PLATELET
Abs Immature Granulocytes: 0.07 10*3/uL (ref 0.00–0.07)
Basophils Absolute: 0.1 10*3/uL (ref 0.0–0.1)
Basophils Relative: 1 %
Eosinophils Absolute: 0 10*3/uL (ref 0.0–0.5)
Eosinophils Relative: 0 %
HCT: 33.4 % — ABNORMAL LOW (ref 36.0–46.0)
Hemoglobin: 9.8 g/dL — ABNORMAL LOW (ref 12.0–15.0)
Immature Granulocytes: 1 %
Lymphocytes Relative: 25 %
Lymphs Abs: 3 10*3/uL (ref 0.7–4.0)
MCH: 24.6 pg — ABNORMAL LOW (ref 26.0–34.0)
MCHC: 29.3 g/dL — ABNORMAL LOW (ref 30.0–36.0)
MCV: 83.7 fL (ref 80.0–100.0)
Monocytes Absolute: 0.8 10*3/uL (ref 0.1–1.0)
Monocytes Relative: 7 %
Neutro Abs: 8 10*3/uL — ABNORMAL HIGH (ref 1.7–7.7)
Neutrophils Relative %: 66 %
Platelets: 162 10*3/uL (ref 150–400)
RBC: 3.99 MIL/uL (ref 3.87–5.11)
RDW: 17.3 % — ABNORMAL HIGH (ref 11.5–15.5)
WBC: 11.9 10*3/uL — ABNORMAL HIGH (ref 4.0–10.5)
nRBC: 0 % (ref 0.0–0.2)

## 2020-06-18 LAB — URINE CULTURE: Culture: >=100000 — AB

## 2020-06-18 LAB — GLUCOSE, CAPILLARY
Glucose-Capillary: 92 mg/dL (ref 70–99)
Glucose-Capillary: 110 mg/dL — ABNORMAL HIGH (ref 70–99)
Glucose-Capillary: 142 mg/dL — ABNORMAL HIGH (ref 70–99)
Glucose-Capillary: 110 mg/dL — ABNORMAL HIGH (ref 70–99)

## 2020-06-18 LAB — SEDIMENTATION RATE: Sed Rate: 36 mm/hr — ABNORMAL HIGH (ref 0–22)

## 2020-06-18 LAB — ECHOCARDIOGRAM LIMITED
Height: 63 in
Weight: 4208 oz

## 2020-06-18 LAB — C-REACTIVE PROTEIN: CRP: 10.1 mg/dL — ABNORMAL HIGH (ref <1.0)

## 2020-06-18 LAB — PROCALCITONIN: Procalcitonin: 11.75 ng/mL

## 2020-06-18 MED ORDER — PENICILLIN G POTASSIUM 20000000 UNITS IJ SOLR
12.0000 10*6.[IU] | Freq: Two times a day (BID) | INTRAVENOUS | Status: DC
  Administered 2020-06-19 – 2020-06-21 (×4): 12 10*6.[IU] via INTRAVENOUS
  Filled 2020-06-18 (×8): qty 12

## 2020-06-18 MED ORDER — FUROSEMIDE 40 MG PO TABS
40.0000 mg | ORAL_TABLET | Freq: Every day | ORAL | Status: DC
  Administered 2020-06-18 – 2020-06-22 (×5): 40 mg via ORAL
  Filled 2020-06-18 (×5): qty 1

## 2020-06-18 NOTE — Progress Notes (Signed)
[facility]                                                                              Patient Demographics  Kelli Rivera, is a 69 y.o. female, DOB - Sep 08, 1951, GHW:[id number]  Admit date - 06/16/2020   Admitting Physician Karie Kirks, DO  Outpatient Primary MD for the patient is [name], MD  Outpatient specialists:   LOS - 2  days   Medical records reviewed and are as summarized below:    Chief Complaint  Patient presents with  . Shortness of Breath  . Fever       Brief summary   Kelli P Robertsis a 69 y.o.femalewithhistory of diabetes mellitus type 2, systolic dysfunction, COPD, rheumatoid arthritis presents to the ER because of fever and chills with low back pain. Patient started having fever chills this morning. No associated shortness of breath chest pain nausea vomiting diarrhea or dysuria. Patient has been having low back pain for the last 3 weeks and has been taking over-the-counter medications. Denies any incontinence of bowel or urine. Has pain on moving her left lower extremity and movement is decreased because of the pain.  ED Course:In the ER patient was febrile with temperature 103 F tachycardic with leukocytosis lactic acid was normal. Blood cultures were sent. Chest x-ray UA unremarkable Covid test was negative. CT abdomen pelvis done shows left lumbar fat-containing hernia otherwise nothing acute. Patient started on empiric antibiotics for possible sepsis. Blood culture x 2 positive for strep agalactiae.   Assessment & Plan    Principal Problem: Sepsis with strep agalactiae bacteremia -Patient presented with fever 102.1 F, hypotension, tachypnea, leukocytosis 16.5, blood cultures positive -Patient also complaining of back pain, acute on chronic, MRI lumbar spine negative for acute infection or fluid collection. -2D echo showed no vegetations. -UA negative for UTI.  Patient was placed on IV Rocephin. -Infectious  disease consulted, seen by Dr. Linus Salmons, recommended IV penicillin for bacteremia.   -Patient complaining of left knee pain, x-ray was obtained which did not show severe effusion, enough to tap. -Procalcitonin improving 11.75 (<-21.7), CRP 10.1, ESR mildly elevated at 36 - Repeat blood cultures - Venous doppler LE negative for DVT   Active Problems:   Diabetes mellitus type 2 in obese (HCC) -Continue sliding scale insulin, Lantus, CBGs stable     RA (rheumatoid arthritis) ([place]) -Patient takes Enbrel injections, outpatient follow-up with rheumatology  Anemia of chronic disease -Likely related to rheumatoid arthritis, H&H stable  COPD -Currently stable, no active wheezing   Chronic systolic CHF -2D echo 7/89/3810 showed EF of 40 to 45% -Lasix was held on admission due to low BP, monitor I's and O's, net +4.5 L, restart oral Lasix 40 mg daily -Monitor renal function, currently stable   Obesity Estimated body mass index is 46.59 kg/m as calculated from the following:   Height as of this encounter: $RemoveBeforeD'5\' 3"'abxBeNrTVuGutC$  (1.6 m).   Weight as of this encounter: 119.3 kg.  Code Status: Full code DVT Prophylaxis:  Lovenox Family Communication: Discussed all imaging results, lab results, explained to the patient's daughter   Disposition Plan:  Status is: Inpatient  Remains inpatient appropriate because:Inpatient level of care appropriate due to severity of illness   Dispo: The patient is from: Home              Anticipated d/c is to: Home              Anticipated d/c date is: 2 days              Patient currently is not medically stable to d/c.  Sepsis work-up in progress      Time Spent in minutes 35 minutes  Procedures:  MRI lumbar spine  Consultants:   Infectious disease  Antimicrobials:   Anti-infectives (From admission, onward)   Start     Dose/Rate Route Frequency Ordered Stop   06/19/20 0900  penicillin G potassium 12 Million Units in dextrose 5 % 500 mL continuous  infusion        12 Million Units 41.7 mL/hr over 12 Hours Intravenous Every 12 hours 06/18/20 1428     06/17/20 1000  vancomycin (VANCOCIN) IVPB 1000 mg/200 mL premix  Status:  Discontinued        1,000 mg 200 mL/hr over 60 Minutes Intravenous Every 12 hours 06/16/20 2352 06/17/20 0927   06/17/20 1000  cefTRIAXone (ROCEPHIN) 2 g in sodium chloride 0.9 % 100 mL IVPB  Status:  Discontinued        2 g 200 mL/hr over 30 Minutes Intravenous Every 24 hours 06/17/20 0927 06/18/20 1428   06/17/20 0000  metroNIDAZOLE (FLAGYL) IVPB 500 mg  Status:  Discontinued        500 mg 100 mL/hr over 60 Minutes Intravenous Every 8 hours 06/16/20 2340 06/17/20 0927   06/17/20 0000  vancomycin (VANCOREADY) IVPB 2000 mg/400 mL        2,000 mg 200 mL/hr over 120 Minutes Intravenous  Once 06/16/20 2352 06/17/20 0325   06/17/20 0000  ceFEPIme (MAXIPIME) 2 g in sodium chloride 0.9 % 100 mL IVPB  Status:  Discontinued        2 g 200 mL/hr over 30 Minutes Intravenous Every 8 hours 06/16/20 2352 06/17/20 0927   06/16/20 2345  ceFEPIme (MAXIPIME) 2 g in sodium chloride 0.9 % 100 mL IVPB  Status:  Discontinued        2 g 200 mL/hr over 30 Minutes Intravenous  Once 06/16/20 2340 06/16/20 2347   06/16/20 2345  vancomycin (VANCOCIN) IVPB 1000 mg/200 mL premix  Status:  Discontinued        1,000 mg 200 mL/hr over 60 Minutes Intravenous  Once 06/16/20 2340 06/16/20 2347   06/16/20 1930  cefTRIAXone (ROCEPHIN) 1 g in sodium chloride 0.9 % 100 mL IVPB  Status:  Discontinued        1 g 200 mL/hr over 30 Minutes Intravenous Every 24 hours 06/16/20 1925 06/16/20 2340          Medications  Scheduled Meds: . aspirin EC  81 mg Oral Daily  . atorvastatin  80 mg Oral q1800  . enoxaparin (LOVENOX) injection  40 mg Subcutaneous Q24H  . insulin aspart  0-9 Units Subcutaneous TID WC  . insulin glargine  20 Units Subcutaneous QHS  . lidocaine  2 patch Transdermal Q24H  . mometasone-formoterol  2 puff Inhalation BID  .  montelukast  10 mg Oral BH-q7a  . oxybutynin  15 mg Oral Daily  . pantoprazole  20 mg Oral BID   Continuous Infusions: . [START ON 06/19/2020] penicillin g continuous IV infusion  PRN Meds:.acetaminophen, albuterol, HYDROcodone-acetaminophen, ibuprofen, LORazepam, ondansetron **OR** ondansetron (ZOFRAN) IV      Subjective:   Kelli Rivera was seen and examined today.  Still complaining of pain, in the back worsens on movement.  Also complaining of left knee pain, swelling in the lower legs. Patient denies dizziness, chest pain, shortness of breath, abdominal pain, N/V/D/C.  No new weakness numbness or tingling.  No acute events overnight.    Objective:   Vitals:   06/18/20 0559 06/18/20 0807 06/18/20 0826 06/18/20 1435  BP:   (!) 127/48 (!) 122/56  Pulse:   78 81  Resp:   20 19  Temp: 99.8 F (37.7 C)  98.5 F (36.9 C) 97.9 F (36.6 C)  TempSrc:   Oral Oral  SpO2:  96% 95% 95%  Weight:      Height:        Intake/Output Summary (Last 24 hours) at 06/18/2020 1615 Last data filed at 06/18/2020 2947 Gross per 24 hour  Intake 240 ml  Output 400 ml  Net -160 ml     Wt Readings from Last 3 Encounters:  06/16/20 119.3 kg  06/06/20 120.2 kg  02/26/20 122 kg     Exam  General: Alert and oriented x 3, NAD  Cardiovascular: S1 S2 auscultated, no murmurs, RRR  Respiratory: Clear to auscultation bilaterally, no wheezing, rales or rhonchi  Gastrointestinal: Soft, nontender, nondistended, + bowel sounds  Ext: 1+ pedal edema bilaterally  Neuro: pain on lifting left lower extremity otherwise strength 5/5 in upper and lower extremities bilaterally  Musculoskeletal: No digital cyanosis, clubbing  Skin: No rashes  Psych: Normal affect and demeanor, alert and oriented x3    Data Reviewed:  I have personally reviewed following labs and imaging studies  Micro Results Recent Results (from the past 240 hour(s))  Blood Culture (routine x 2)     Status: Abnormal  (Preliminary result)   Collection Time: 06/16/20  8:00 PM   Specimen: BLOOD  Result Value Ref Range Status   Specimen Description   Final    BLOOD LEFT ANTECUBITAL Performed at [facility], [address], [place], [place] [address]    Special Requests   Final    BOTTLES DRAWN AEROBIC AND ANAEROBIC Blood Culture results may not be optimal due to an excessive volume of blood received in culture bottles Performed at [place] [address] [place], [place] [address]    Culture  Setup Time   Final    GRAM POSITIVE COCCI IN CHAINS IN BOTH AEROBIC AND ANAEROBIC BOTTLES CRITICAL RESULT CALLED TO, READ BACK BY AND VERIFIED WITH: Hughes [phone] FCP    Culture (A)  Final    GROUP B STREP(S.AGALACTIAE)ISOLATED SUSCEPTIBILITIES TO FOLLOW Performed at [facility], [place] [address] [place], [place] [address]    Report Status PENDING  Incomplete  Blood Culture ID Panel (Reflexed)     Status: Abnormal   Collection Time: 06/16/20  8:00 PM  Result Value Ref Range Status   Enterococcus faecalis NOT DETECTED NOT DETECTED Final   Enterococcus Faecium NOT DETECTED NOT DETECTED Final   Listeria monocytogenes NOT DETECTED NOT DETECTED Final   Staphylococcus species NOT DETECTED NOT DETECTED Final   Staphylococcus aureus (BCID) NOT DETECTED NOT DETECTED Final   Staphylococcus epidermidis NOT DETECTED NOT DETECTED Final   Staphylococcus lugdunensis NOT DETECTED NOT DETECTED Final   Streptococcus species DETECTED (A) NOT DETECTED Final    Comment: CRITICAL RESULT CALLED  TO, READ BACK BY AND VERIFIED WITH: PHARMD E. Glennon Mac [phone] FCP    Streptococcus agalactiae DETECTED (A) NOT DETECTED Final    Comment: CRITICAL RESULT CALLED TO, READ BACK BY AND VERIFIED WITH: PHARMD E. Glennon Mac [phone] FCP    Streptococcus pneumoniae NOT DETECTED NOT DETECTED Final   Streptococcus pyogenes NOT DETECTED NOT DETECTED Final    A.calcoaceticus-baumannii NOT DETECTED NOT DETECTED Final   Bacteroides fragilis NOT DETECTED NOT DETECTED Final   Enterobacterales NOT DETECTED NOT DETECTED Final   Enterobacter cloacae complex NOT DETECTED NOT DETECTED Final   Escherichia coli NOT DETECTED NOT DETECTED Final   Klebsiella aerogenes NOT DETECTED NOT DETECTED Final   Klebsiella oxytoca NOT DETECTED NOT DETECTED Final   Klebsiella pneumoniae NOT DETECTED NOT DETECTED Final   Proteus species NOT DETECTED NOT DETECTED Final   Salmonella species NOT DETECTED NOT DETECTED Final   Serratia marcescens NOT DETECTED NOT DETECTED Final   Haemophilus influenzae NOT DETECTED NOT DETECTED Final   Neisseria meningitidis NOT DETECTED NOT DETECTED Final   Pseudomonas aeruginosa NOT DETECTED NOT DETECTED Final   Stenotrophomonas maltophilia NOT DETECTED NOT DETECTED Final   Candida albicans NOT DETECTED NOT DETECTED Final   Candida auris NOT DETECTED NOT DETECTED Final   Candida glabrata NOT DETECTED NOT DETECTED Final   Candida krusei NOT DETECTED NOT DETECTED Final   Candida parapsilosis NOT DETECTED NOT DETECTED Final   Candida tropicalis NOT DETECTED NOT DETECTED Final   Cryptococcus neoformans/gattii NOT DETECTED NOT DETECTED Final    Comment: Performed at [facility], [address] [place], [place] [address]  Blood Culture (routine x 2)     Status: Abnormal (Preliminary result)   Collection Time: 06/16/20  8:10 PM   Specimen: BLOOD RIGHT FOREARM  Result Value Ref Range Status   Specimen Description   Final    BLOOD RIGHT FOREARM Performed at [facility] [address] [place], [place] [address]    Special Requests   Final    BOTTLES DRAWN AEROBIC AND ANAEROBIC Blood Culture adequate volume Performed at [facility] [address] [place], [place] [address]    Culture  Setup Time   Final    GRAM POSITIVE COCCI IN CHAINS IN BOTH AEROBIC AND ANAEROBIC BOTTLES Performed at  [facility], [place] [address] [place], [place] [address]    Culture GROUP B STREP(S.AGALACTIAE)ISOLATED (A)  Final   Report Status PENDING  Incomplete  SARS Coronavirus 2 by RT PCR (hospital order, performed in [facility] hospital lab) Nasopharyngeal Nasopharyngeal Swab     Status: None   Collection Time: 06/16/20  8:18 PM   Specimen: Nasopharyngeal Swab  Result Value Ref Range Status   SARS Coronavirus 2 NEGATIVE NEGATIVE Final    Comment: (NOTE) SARS-CoV-2 target nucleic acids are NOT DETECTED.  The SARS-CoV-2 RNA is generally detectable in upper and lower respiratory specimens during the acute phase of infection. The lowest concentration of SARS-CoV-2 viral copies this assay can detect is 250 copies / mL. A negative result does not preclude SARS-CoV-2 infection and should not be used as the sole basis for treatment or other patient management decisions.  A negative result may occur with improper specimen collection / handling, submission of specimen other than nasopharyngeal swab, presence of viral mutation(s) within the areas targeted by this assay, and inadequate number of viral copies (<250 copies / mL). A negative result must be combined with clinical observations, patient history, and epidemiological  information.  Fact Sheet for Patients:   BoilerBrush.com.cy  Fact Sheet for Healthcare Providers: https://pope.com/  This test is not yet approved or  cleared by the [place] FDA and has been authorized for detection and/or diagnosis of SARS-CoV-2 by FDA under an Emergency Use Authorization (EUA).  This EUA will remain in effect (meaning this test can be used) for the duration of the COVID-19 declaration under Section 564(b)(1) of the Act, 21 U.S.C. section 360bbb-3(b)(1), unless the authorization is terminated or revoked sooner.  Performed at [facility], [address], [place], [place]  [address]   Urine culture     Status: Abnormal   Collection Time: 06/16/20  9:30 PM   Specimen: In/Out Cath Urine  Result Value Ref Range Status   Specimen Description   Final    IN/OUT CATH URINE Performed at [facility], [address], [place], [place] [address]    Special Requests   Final    NONE Performed at [facility], [address] [place], [place] [address]    Culture >=100,000 COLONIES/mL ESCHERICHIA COLI (A)  Final   Report Status 06/18/2020 FINAL  Final   Organism ID, Bacteria ESCHERICHIA COLI (A)  Final      Susceptibility   Escherichia coli - MIC*    AMPICILLIN <=2 SENSITIVE Sensitive     CEFAZOLIN <=4 SENSITIVE Sensitive     CEFTRIAXONE <=0.25 SENSITIVE Sensitive     CIPROFLOXACIN <=0.25 SENSITIVE Sensitive     GENTAMICIN <=1 SENSITIVE Sensitive     IMIPENEM <=0.25 SENSITIVE Sensitive     NITROFURANTOIN <=16 SENSITIVE Sensitive     TRIMETH/SULFA <=20 SENSITIVE Sensitive     AMPICILLIN/SULBACTAM <=2 SENSITIVE Sensitive     PIP/TAZO <=4 SENSITIVE Sensitive     * >=100,000 COLONIES/mL ESCHERICHIA COLI    Radiology Reports DG Chest 2 View  Result Date: 06/06/2020 CLINICAL DATA:  Indigestion. Elevated D-dimer. No reported shortness of breath. EXAM: CHEST - 2 VIEW COMPARISON:  01/22/2020 FINDINGS: Cardiac silhouette is normal in size. No mediastinal or hilar masses. No evidence of adenopathy. Clear lungs.  No pleural effusion or pneumothorax. Skeletal structures are intact. IMPRESSION: No active cardiopulmonary disease. Electronically Signed   By: Amie Portland M.D.   On: 06/06/2020 13:32   CT Angio Chest PE W and/or Wo Contrast  Result Date: 06/06/2020 CLINICAL DATA:  Pt with elevated d-dimer sent to ED from PMD's office; pt states she has chronic SOB and leg swelling; diabetic; no h/o PE or blood clots EXAM: CT ANGIOGRAPHY CHEST WITH CONTRAST TECHNIQUE: Multidetector CT imaging of the chest was performed using the standard  protocol during bolus administration of intravenous contrast. Multiplanar CT image reconstructions and MIPs were obtained to evaluate the vascular anatomy. CONTRAST:  OMNIPAQUE IOHEXOL 350 MG/ML SOLN COMPARISON:  Current chest radiograph. FINDINGS: Cardiovascular: There is satisfactory opacification of the pulmonary arteries to the segmental level. There is no evidence of a pulmonary embolism. Heart is top-normal in size. No pericardial effusion. No coronary artery calcifications. Aorta is normal in caliber. No dissection mild atherosclerosis along the arch. Branch vessels are widely patent. Mediastinum/Nodes: Thyroid nodules, with evidence of a nodule enlarging the left lobe, at least 2 cm in size. No neck base axillary, mediastinal or hilar masses or enlarged lymph nodes. Trachea and esophagus are unremarkable. Lungs/Pleura: No pneumonia or pulmonary edema. No mass or suspicious nodule. Peripheral interstitial thickening mild changes of centrilobular emphysema. No pleural effusion or pneumothorax. Upper Abdomen: No acute  abnormality. Musculoskeletal: No fracture or acute finding.  No bone lesion. Review of the MIP images confirms the above findings. IMPRESSION: 1. No evidence of a pulmonary embolism. 2. No acute findings. 3. Chronic appearing interstitial thickening. Mild centrilobular emphysema. 4. Mild aortic atherosclerosis. Aortic Atherosclerosis (ICD10-I70.0) and Emphysema (ICD10-J43.9). Electronically Signed   By: Lajean Manes M.D.   On: 06/06/2020 14:45   MR Lumbar Spine W Wo Contrast  Result Date: 06/17/2020 CLINICAL DATA:  Low back pain radiating down the left leg with numbness and weakness. EXAM: MRI LUMBAR SPINE WITHOUT AND WITH CONTRAST TECHNIQUE: Multiplanar and multiecho pulse sequences of the lumbar spine were obtained without and with intravenous contrast. CONTRAST:  32mL GADAVIST GADOBUTROL 1 MMOL/ML IV SOLN COMPARISON:  CT scan 06/16/2020 and lumbar MRI from 03/29/2017 FINDINGS:  Segmentation: The lowest lumbar type non-rib-bearing vertebra is labeled as L5. Alignment:  No vertebral subluxation is observed. Vertebrae: Type 3 degenerative endplate findings at K3-5 with disc desiccation and loss of disc height at this level. Left facet and perifacet edema and enhancement at the L3-4 level attributed to degenerative arthropathy. No compelling findings of discitis or osteomyelitis. Conus medullaris and cauda equina: Conus extends to the L1-2 level. Conus and cauda equina appear normal, without abnormal enhancement or clumping of the cauda equina to suggest arachnoiditis. Paraspinal and other soft tissues: There is evidence of a chronic left lumbar hernia, with herniation of left perirenal adipose tissue into the subcutaneous tissues, although review of prior studies indicates this to be a chronic hernia, and review of recent CT reveals no significant degree of abnormal associated inflammation. Disc levels: T11-12: Borderline right foraminal stenosis due to disc bulge and right inferior foraminal disc protrusion. This level is only included on the parasagittal images. T12-L1: No impingement.  Small central disc protrusion. L1-2: No impingement. Mild disc bulge with small central annular tear. L2-3: Unremarkable. L3-4: No impingement. Disc bulge and bilateral degenerative facet arthropathy. L4-5: Mild bilateral foraminal stenosis and mild right subarticular lateral recess stenosis due to disc bulge, intervertebral spurring, and mild facet arthropathy. There is also a conjoined appearance of the left L4 and L5 nerve roots which contributes to low position of the left L4 nerve roots in the neural foramen which is likewise factor mild left foraminal stenosis. Impingement at this level is mildly worsened compared to 03/29/2017. L5-S1: No impingement. Small left paracentral disc protrusion. The broad left transverse process of L5 appears to mildly pseudo articulate with the sacrum, without obvious  edema signal to suggest Bertolotti syndrome. IMPRESSION: 1. Lumbar spondylosis and degenerative disc disease causing mild bilateral impingement at L4-5, mildly worsened from 03/29/2017. 2. Left facet and perifacet edema and low-grade enhancement at the L3-4 level is notably increased from 03/29/2017, but probably due to degenerative facet arthropathy given the morphology of the facets. There is no overt facet joint effusion and hence I am skeptical of infected left facet joint at L3-4 as a cause for the appearance. There is no drainable collection in this vicinity. 3. The broad left transverse process of L5 appears to mildly pseudo articulate with the sacrum, but there is no obvious edema signal to suggest Bertolotti syndrome. Electronically Signed   By: Van Clines M.D.   On: 06/17/2020 07:20   CT ABDOMEN PELVIS W CONTRAST  Result Date: 06/16/2020 CLINICAL DATA:  69 year old female with left flank pain. Concern for kidney stone. EXAM: CT ABDOMEN AND PELVIS WITH CONTRAST TECHNIQUE: Multidetector CT imaging of the abdomen and pelvis was performed using the  standard protocol following bolus administration of intravenous contrast. CONTRAST:  117mL OMNIPAQUE IOHEXOL 300 MG/ML  SOLN COMPARISON:  CT abdomen pelvis dated 01/09/2018. FINDINGS: Lower chest: There are minimal bibasilar atelectasis. No intra-abdominal free air or free fluid. Hepatobiliary: The liver is unremarkable. No intrahepatic biliary dilatation. Cholecystectomy. Pancreas: Unremarkable. No pancreatic ductal dilatation or surrounding inflammatory changes. Spleen: Normal in size without focal abnormality. Adrenals/Urinary Tract: The adrenal glands unremarkable. There is no hydronephrosis on either side. There is symmetric enhancement and excretion of contrast by both kidneys. Subcentimeter left renal inferior pole hypodense focus is too small to characterize but appears similar to prior CT. The visualized ureters and urinary bladder appear  unremarkable. Stomach/Bowel: There is a small hiatal hernia. There is moderate stool throughout the colon. There is no bowel obstruction or active inflammation. There is a 3.5 cm distal duodenal diverticulum. No active inflammatory changes. Appendectomy. Vascular/Lymphatic: The abdominal aorta and IVC unremarkable. No portal venous gas. There is no adenopathy. Reproductive: Hysterectomy. There is a 2 cm left adnexal cyst, similar to prior CT and likely a benign or indolent process. This can be better evaluated with ultrasound on a nonemergent/outpatient basis if clinically indicated. Other: There is a small fat containing left lumbar hernia similar to prior CT. No associated inflammatory changes. Musculoskeletal: Degenerative changes of the spine. No acute osseous pathology. IMPRESSION: 1. No acute intra-abdominal or pelvic pathology. No hydronephrosis or nephrolithiasis. 2. Small left lumbar fat containing hernia similar to prior CT. No associated inflammatory changes. 3. Moderate colonic stool burden. No bowel obstruction. 4. A 2 cm left adnexal cyst, similar to prior CT and likely a benign or indolent process. Electronically Signed   By: Anner Crete M.D.   On: 06/16/2020 20:06   DG Chest Port 1 View  Result Date: 06/16/2020 CLINICAL DATA:  69 year old female with questionable sepsis. EXAM: PORTABLE CHEST 1 VIEW COMPARISON:  Chest radiograph dated 06/06/2020 and CT dated 06/06/2020 FINDINGS: No focal consolidation, pleural effusion, or pneumothorax. Stable cardiac silhouette. Atherosclerotic calcification of the aortic arch. No acute osseous pathology. IMPRESSION: No active disease. Electronically Signed   By: Anner Crete M.D.   On: 06/16/2020 19:39   VAS [place] LOWER EXTREMITY VENOUS (DVT)  Result Date: 06/18/2020  Lower Venous DVT Study Indications: Pain.  Risk Factors: None identified. Limitations: Body habitus and poor ultrasound/tissue interface. Comparison Study: No prior studies. Performing  Technologist: Oliver Hum RVT  Examination Guidelines: A complete evaluation includes B-mode imaging, spectral Doppler, color Doppler, and power Doppler as needed of all accessible portions of each vessel. Bilateral testing is considered an integral part of a complete examination. Limited examinations for reoccurring indications may be performed as noted. The reflux portion of the exam is performed with the patient in reverse Trendelenburg.  +-----+---------------+---------+-----------+----------+--------------+ RIGHTCompressibilityPhasicitySpontaneityPropertiesThrombus Aging +-----+---------------+---------+-----------+----------+--------------+ CFV  Full           Yes      Yes                                 +-----+---------------+---------+-----------+----------+--------------+   +---------+---------------+---------+-----------+----------+--------------+ LEFT     CompressibilityPhasicitySpontaneityPropertiesThrombus Aging +---------+---------------+---------+-----------+----------+--------------+ CFV      Full           Yes      Yes                                 +---------+---------------+---------+-----------+----------+--------------+ SFJ  Full                                                        +---------+---------------+---------+-----------+----------+--------------+ FV Prox  Full                                                        +---------+---------------+---------+-----------+----------+--------------+ FV Mid   Full                                                        +---------+---------------+---------+-----------+----------+--------------+ FV Distal               Yes      Yes                                 +---------+---------------+---------+-----------+----------+--------------+ PFV      Full                                                         +---------+---------------+---------+-----------+----------+--------------+ POP      Full           Yes      Yes                                 +---------+---------------+---------+-----------+----------+--------------+ PTV      Full                                                        +---------+---------------+---------+-----------+----------+--------------+ PERO     Full                                                        +---------+---------------+---------+-----------+----------+--------------+     Summary: RIGHT: - No evidence of common femoral vein obstruction.  LEFT: - There is no evidence of deep vein thrombosis in the lower extremity. However, portions of this examination were limited- see technologist comments above.  - No cystic structure found in the popliteal fossa.  *See table(s) above for measurements and observations.    Preliminary    ECHOCARDIOGRAM LIMITED  Result Date: 06/18/2020    ECHOCARDIOGRAM LIMITED REPORT   Patient Name:   Kelli Rivera Date of Exam: 06/18/2020 Medical Rec #:  [id number]      Height:       63.0 in Accession #:    [id number]     Weight:  263.0 lb Date of Birth:  04-May-1951       BSA:          2.172 m Patient Age:    69 years       BP:           127/48 mmHg Patient Gender: F              HR:           78 bpm. Exam Location:  Inpatient Procedure: Limited Echo Indications:    bacteremia  History:        Patient has prior history of Echocardiogram examinations, most                 recent 01/23/2020. COPD; Risk Factors:Diabetes, Dyslipidemia and                 Former Smoker.  Sonographer:    Celene Skeen RDCS (AE) Referring Phys: [id number] ROBERT W COMER  Sonographer Comments: Technically difficult study due to poor echo windows and Technically challenging study due to limited acoustic windows. IMPRESSIONS  1. Left ventricular ejection fraction, by estimation, is 40 to 45%. The left ventricle has mildly decreased function. The left ventricle  demonstrates global hypokinesis.  2. Right ventricular systolic function is normal. The right ventricular size is normal.  3. The mitral valve is grossly normal. No evidence of mitral valve regurgitation. No evidence of mitral stenosis.  4. The aortic valve is tricuspid. Aortic valve regurgitation is not visualized. No aortic stenosis is present.  5. The inferior vena cava is dilated in size with <50% respiratory variability, suggesting right atrial pressure of 15 mmHg. Comparison(s): No significant change from prior study. Conclusion(s)/Recommendation(s): No evidence of valvular vegetations on this transthoracic echocardiogram. Would recommend a transesophageal echocardiogram to exclude infective endocarditis if clinically indicated. FINDINGS  Left Ventricle: Left ventricular ejection fraction, by estimation, is 40 to 45%. The left ventricle has mildly decreased function. The left ventricle demonstrates global hypokinesis. The left ventricular internal cavity size was normal in size. There is  no left ventricular hypertrophy. Right Ventricle: The right ventricular size is normal. No increase in right ventricular wall thickness. Right ventricular systolic function is normal. Left Atrium: Left atrial size was normal in size. Right Atrium: Right atrial size was normal in size. Pericardium: Trivial pericardial effusion is present. Mitral Valve: The mitral valve is grossly normal. No evidence of mitral valve stenosis. Tricuspid Valve: The tricuspid valve is grossly normal. Tricuspid valve regurgitation is not demonstrated. No evidence of tricuspid stenosis. Aortic Valve: The aortic valve is tricuspid. Aortic valve regurgitation is not visualized. No aortic stenosis is present. Pulmonic Valve: The pulmonic valve was grossly normal. Pulmonic valve regurgitation is not visualized. No evidence of pulmonic stenosis. Venous: The inferior vena cava is dilated in size with less than 50% respiratory variability, suggesting right  atrial pressure of 15 mmHg. IAS/Shunts: The atrial septum is grossly normal. Lennie Odor MD Electronically signed by Lennie Odor MD Signature Date/Time: 06/18/2020/3:16:59 PM    Final    DG Knee 3 Views Left  Result Date: 06/18/2020 CLINICAL DATA:  Pain EXAM: LEFT KNEE - 3 VIEW COMPARISON:  None. FINDINGS: Frontal, tunnel, and lateral views obtained. There is diffuse soft tissue swelling. No fracture or dislocation. There is a small joint effusion. There is generalized joint space narrowing with spurring in all compartments. No erosive changes. IMPRESSION: Generalized osteoarthritic change. Small joint effusion. Soft tissue swelling evident. No fracture or dislocation. No erosion. Electronically Signed  By: Lowella Grip III M.D.   On: 06/18/2020 10:12    Lab Data:  CBC: Recent Labs  Lab 06/16/20 2012 06/16/20 2026 06/17/20 0030 06/17/20 0607 06/18/20 0253  WBC 16.5*  --  13.6* 13.1* 11.9*  NEUTROABS 14.8*  --  12.4*  --  8.0*  HGB 10.9* 11.9* 9.8* 10.4* 9.8*  HCT 36.2 35.0* 32.1* 35.0* 33.4*  MCV 81.7  --  81.9 83.5 83.7  PLT 192  --  152 146* 287   Basic Metabolic Panel: Recent Labs  Lab 06/16/20 2012 06/16/20 2026 06/17/20 0030 06/17/20 0607 06/18/20 0253  NA 139 142 135  --  137  K 4.4 5.0 4.0  --  4.3  CL 109 106 106  --  106  CO2 25  --  23  --  25  GLUCOSE 124* 120* 154*  --  97  BUN $Re'18 21 16  'vFD$ --  15  CREATININE 0.75 0.70 0.62 0.77 0.82  CALCIUM 8.3*  --  7.8*  --  8.2*   GFR: Estimated Creatinine Clearance: 79.8 mL/min (by C-G formula based on SCr of 0.82 mg/dL). Liver Function Tests: Recent Labs  Lab 06/16/20 2012 06/17/20 0030  AST 38 58*  ALT 24 36  ALKPHOS 69 66  BILITOT 0.4 0.7  PROT 6.2* 5.3*  ALBUMIN 3.0* 2.6*   No results for input(s): LIPASE, AMYLASE in the last 168 hours. No results for input(s): AMMONIA in the last 168 hours. Coagulation Profile: Recent Labs  Lab 06/16/20 2012  INR 1.1   Cardiac Enzymes: No results for  input(s): CKTOTAL, CKMB, CKMBINDEX, TROPONINI in the last 168 hours. BNP (last 3 results) No results for input(s): PROBNP in the last 8760 hours. HbA1C: No results for input(s): HGBA1C in the last 72 hours. CBG: Recent Labs  Lab 06/17/20 1622 06/17/20 1730 06/17/20 2120 06/18/20 0722 06/18/20 1207  GLUCAP 93 82 80 92 110*   Lipid Profile: No results for input(s): CHOL, HDL, LDLCALC, TRIG, CHOLHDL, LDLDIRECT in the last 72 hours. Thyroid Function Tests: No results for input(s): TSH, T4TOTAL, FREET4, T3FREE, THYROIDAB in the last 72 hours. Anemia Panel: No results for input(s): VITAMINB12, FOLATE, FERRITIN, TIBC, IRON, RETICCTPCT in the last 72 hours. Urine analysis:    Component Value Date/Time   COLORURINE YELLOW 06/16/2020 2120   APPEARANCEUR CLEAR 06/16/2020 2120   LABSPEC >1.046 (H) 06/16/2020 2120   PHURINE 5.0 06/16/2020 2120   GLUCOSEU NEGATIVE 06/16/2020 2120   HGBUR NEGATIVE 06/16/2020 2120   BILIRUBINUR NEGATIVE 06/16/2020 2120   KETONESUR NEGATIVE 06/16/2020 2120   PROTEINUR NEGATIVE 06/16/2020 2120   NITRITE NEGATIVE 06/16/2020 2120   LEUKOCYTESUR NEGATIVE 06/16/2020 2120     Kelli Rivera M.D. [facility] 06/18/2020, 4:15 PM   Call night coverage person covering after 7pm

## 2020-06-18 NOTE — Progress Notes (Signed)
Left lower extremity venous duplex has been completed. Preliminary results can be found in CV Proc through chart review.   06/18/20 9:43 AM Olen Cordial RVT

## 2020-06-18 NOTE — Consult Note (Signed)
[facility] for Infectious Disease       Reason for Consult: bacteremia    Referring Physician: Dr. Benny Lennert  Principal Problem:   SIRS (systemic inflammatory response syndrome) ([place]) Active Problems:   Diabetes mellitus type 2 in obese (HCC)   Chronic dermatitis   RA (rheumatoid arthritis) ([place])   Streptococcal bacteremia   . aspirin EC  81 mg Oral Daily  . atorvastatin  80 mg Oral q1800  . enoxaparin (LOVENOX) injection  40 mg Subcutaneous Q24H  . insulin aspart  0-9 Units Subcutaneous TID WC  . insulin glargine  20 Units Subcutaneous QHS  . lidocaine  2 patch Transdermal Q24H  . mometasone-formoterol  2 puff Inhalation BID  . montelukast  10 mg Oral BH-q7a  . oxybutynin  15 mg Oral Daily  . pantoprazole  20 mg Oral BID    Recommendations: Penicillin Stop ceftriaxone TTE - done  Assessment: She has bacteremia with GBS with back pain, acute on chronic with MRI negative for infection.  She is improved since admission.  TTE with no vegetation.   Right leg is warm but no cellulitis noted.   Fever curve and leukocytosis trending down.   Antibiotics: Vancomycin, cefepime, metronidazole  HPI: Kelli Rivera is a 69 y.o. female with RA on Embrel came in 8/23 with fever, chills and low back pain.  No sob, no rash, no diarrhea.  Has had back pain previously but noted more pain over the last 3 weeks.  MRI c/w degenerative disc disease.  She feels better since admission.  Tmax 103 but afebrile over last 24 hours.  No dysuria.  ESR sent and only 36, CRP 10.1.   Review of Systems:  Constitutional: negative for malaise and anorexia Gastrointestinal: negative for nausea, vomiting and diarrhea All other systems reviewed and are negative    Past Medical History:  Diagnosis Date  . Asthma   . Collagen vascular disease ([place])   . COPD (chronic obstructive pulmonary disease) (HCC)    O2 increased to 3L while ambulating, 2L at rest as of last week  . Diabetes mellitus without  complication ([place])   . Feeling of chest tightness 12/2019  . High cholesterol   . 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 Morbid obesity ([place])     Social History   Tobacco Use  . Smoking status: Former Smoker    Packs/day: 1.00    Years: 42.00    Pack years: 42.00    Quit date: 2007    Years since quitting: 14.6  . Smokeless tobacco: Never Used  Vaping Use  . Vaping Use: Never used  Substance Use Topics  . Alcohol use: No  . Drug use: No    Family History  Problem Relation Age of Onset  . Heart failure Mother 59  . Lung cancer Father 25    Allergies  Allergen Reactions  . Liraglutide Nausea And Vomiting  . Pioglitazone Swelling  . Amoxicillin-Pot Clavulanate Nausea And Vomiting    Did it involve swelling of the face/tongue/throat, SOB, or low BP? No Did it involve sudden or severe rash/hives, skin peeling, or any reaction on the inside of your mouth or nose? No Did you need to seek medical attention at a hospital or doctor's office? No When did it last happen?less than 10 years If all above answers are "NO", may proceed with cephalosporin use.     Physical Exam: Constitutional: in no apparent distress  Vitals:   06/18/20 0826 06/18/20 1435  BP: (!) 127/48 (!) 122/56  Pulse: 78 81  Resp: 20 19  Temp: 98.5 F (36.9 C) 97.9 F (36.6 C)  SpO2: 95% 95%   EYES: anicteric Cardiovascular: Cor RRR Respiratory: clear; GI: soft Musculoskeletal: left leg with warmth, no erythema, no edema Skin: negatives: no rash Neuro: non-focal  Lab Results  Component Value Date   WBC 11.9 (H) 06/18/2020   HGB 9.8 (L) 06/18/2020   HCT 33.4 (L) 06/18/2020   MCV 83.7 06/18/2020   PLT 162 06/18/2020    Lab Results  Component Value Date   CREATININE 0.82 06/18/2020   BUN 15 06/18/2020   NA 137 06/18/2020   K 4.3 06/18/2020   CL 106 06/18/2020   CO2 25 06/18/2020    Lab Results  Component Value Date   ALT 36 06/17/2020   AST  58 (H) 06/17/2020   ALKPHOS 66 06/17/2020     Microbiology: Recent Results (from the past 240 hour(s))  Blood Culture (routine x 2)     Status: Abnormal (Preliminary result)   Collection Time: 06/16/20  8:00 PM   Specimen: BLOOD  Result Value Ref Range Status   Specimen Description   Final    BLOOD LEFT ANTECUBITAL Performed at [facility], [place] [address], [place], [place] [address]    Special Requests   Final    BOTTLES DRAWN AEROBIC AND ANAEROBIC Blood Culture results may not be optimal due to an excessive volume of blood received in culture bottles Performed at [facility] [address] [place], [place] [address]    Culture  Setup Time   Final    GRAM POSITIVE COCCI IN CHAINS IN BOTH AEROBIC AND ANAEROBIC BOTTLES CRITICAL RESULT CALLED TO, READ BACK BY AND VERIFIED WITH: Melvin [phone] FCP    Culture (A)  Final    GROUP B STREP(S.AGALACTIAE)ISOLATED SUSCEPTIBILITIES TO FOLLOW Performed at [facility], [place] [address] [place], [place] [address]    Report Status PENDING  Incomplete  Blood Culture ID Panel (Reflexed)     Status: Abnormal   Collection Time: 06/16/20  8:00 PM  Result Value Ref Range Status   Enterococcus faecalis NOT DETECTED NOT DETECTED Final   Enterococcus Faecium NOT DETECTED NOT DETECTED Final   Listeria monocytogenes NOT DETECTED NOT DETECTED Final   Staphylococcus species NOT DETECTED NOT DETECTED Final   Staphylococcus aureus (BCID) NOT DETECTED NOT DETECTED Final   Staphylococcus epidermidis NOT DETECTED NOT DETECTED Final   Staphylococcus lugdunensis NOT DETECTED NOT DETECTED Final   Streptococcus species DETECTED (A) NOT DETECTED Final    Comment: CRITICAL RESULT CALLED TO, READ BACK BY AND VERIFIED WITH: PHARMD E. JACKSON [phone] FCP    Streptococcus agalactiae DETECTED (A) NOT DETECTED Final    Comment: CRITICAL RESULT CALLED TO, READ BACK BY AND VERIFIED WITH: PHARMD E.  Glennon Mac [phone] FCP    Streptococcus pneumoniae NOT DETECTED NOT DETECTED Final   Streptococcus pyogenes NOT DETECTED NOT DETECTED Final   A.calcoaceticus-baumannii NOT DETECTED NOT DETECTED Final   Bacteroides fragilis NOT DETECTED NOT DETECTED Final   Enterobacterales NOT DETECTED NOT DETECTED Final   Enterobacter cloacae complex NOT DETECTED NOT DETECTED Final   Escherichia coli NOT DETECTED NOT DETECTED Final   Klebsiella aerogenes NOT DETECTED NOT DETECTED Final   Klebsiella oxytoca NOT DETECTED NOT DETECTED Final   Klebsiella pneumoniae NOT DETECTED NOT DETECTED Final   Proteus species NOT  DETECTED NOT DETECTED Final   Salmonella species NOT DETECTED NOT DETECTED Final   Serratia marcescens NOT DETECTED NOT DETECTED Final   Haemophilus influenzae NOT DETECTED NOT DETECTED Final   Neisseria meningitidis NOT DETECTED NOT DETECTED Final   Pseudomonas aeruginosa NOT DETECTED NOT DETECTED Final   Stenotrophomonas maltophilia NOT DETECTED NOT DETECTED Final   Candida albicans NOT DETECTED NOT DETECTED Final   Candida auris NOT DETECTED NOT DETECTED Final   Candida glabrata NOT DETECTED NOT DETECTED Final   Candida krusei NOT DETECTED NOT DETECTED Final   Candida parapsilosis NOT DETECTED NOT DETECTED Final   Candida tropicalis NOT DETECTED NOT DETECTED Final   Cryptococcus neoformans/gattii NOT DETECTED NOT DETECTED Final    Comment: Performed at [facility], [place] [address] [place], [place] [address]  Blood Culture (routine x 2)     Status: Abnormal (Preliminary result)   Collection Time: 06/16/20  8:10 PM   Specimen: BLOOD RIGHT FOREARM  Result Value Ref Range Status   Specimen Description   Final    BLOOD RIGHT FOREARM Performed at [facility] [address] [place], [place] [address]    Special Requests   Final    BOTTLES DRAWN AEROBIC AND ANAEROBIC Blood Culture adequate volume Performed at [facility] [address], [place], [place] [address]    Culture  Setup Time   Final    GRAM POSITIVE COCCI IN CHAINS IN BOTH AEROBIC AND ANAEROBIC BOTTLES Performed at [facility], [place] [address] [place], [place] [address]    Culture GROUP B STREP(S.AGALACTIAE)ISOLATED (A)  Final   Report Status PENDING  Incomplete  SARS Coronavirus 2 by RT PCR (hospital order, performed in [facility] hospital lab) Nasopharyngeal Nasopharyngeal Swab     Status: None   Collection Time: 06/16/20  8:18 PM   Specimen: Nasopharyngeal Swab  Result Value Ref Range Status   SARS Coronavirus 2 NEGATIVE NEGATIVE Final    Comment: (NOTE) SARS-CoV-2 target nucleic acids are NOT DETECTED.  The SARS-CoV-2 RNA is generally detectable in upper and lower respiratory specimens during the acute phase of infection. The lowest concentration of SARS-CoV-2 viral copies this assay can detect is 250 copies / mL. A negative result does not preclude SARS-CoV-2 infection and should not be used as the sole basis for treatment or other patient management decisions.  A negative result may occur with improper specimen collection / handling, submission of specimen other than nasopharyngeal swab, presence of viral mutation(s) within the areas targeted by this assay, and inadequate number of viral copies (<250 copies / mL). A negative result must be combined with clinical observations, patient history, and epidemiological information.  Fact Sheet for Patients:   StrictlyIdeas.no  Fact Sheet for Healthcare Providers: BankingDealers.co.za  This test is not yet approved or  cleared by the [place] FDA and has been authorized for detection and/or diagnosis of SARS-CoV-2 by FDA under an Emergency Use Authorization (EUA).  This EUA will remain in effect (meaning this test can be used) for the duration of the COVID-19 declaration under Section 564(b)(1) of the Act, 21 U.S.C. section 360bbb-3(b)(1),  unless the authorization is terminated or revoked sooner.  Performed at [facility], [place] [address] [place], [place] [address]   Urine culture     Status: Abnormal   Collection Time: 06/16/20  9:30 PM   Specimen: In/Out Cath Urine  Result Value Ref Range Status   Specimen Description   Final  IN/OUT CATH URINE Performed at [facility], [place] [address], [place], [place] [address]    Special Requests   Final    NONE Performed at [facility], [place] [address], [place], [place] [address]    Culture >=100,000 COLONIES/mL ESCHERICHIA COLI (A)  Final   Report Status 06/18/2020 FINAL  Final   Organism ID, Bacteria ESCHERICHIA COLI (A)  Final      Susceptibility   Escherichia coli - MIC*    AMPICILLIN <=2 SENSITIVE Sensitive     CEFAZOLIN <=4 SENSITIVE Sensitive     CEFTRIAXONE <=0.25 SENSITIVE Sensitive     CIPROFLOXACIN <=0.25 SENSITIVE Sensitive     GENTAMICIN <=1 SENSITIVE Sensitive     IMIPENEM <=0.25 SENSITIVE Sensitive     NITROFURANTOIN <=16 SENSITIVE Sensitive     TRIMETH/SULFA <=20 SENSITIVE Sensitive     AMPICILLIN/SULBACTAM <=2 SENSITIVE Sensitive     PIP/TAZO <=4 SENSITIVE Sensitive     * >=100,000 COLONIES/mL ESCHERICHIA COLI    Thayer Headings, [facility] for Infectious Disease [facility] [email or web address] 06/18/2020, 3:24 PM

## 2020-06-18 NOTE — Progress Notes (Signed)
   06/18/20 0005  Assess: MEWS Score  Temp 99.2 F (37.3 C)  BP (!) 100/48  Pulse Rate 82  Resp 20  SpO2 94 %  Assess: MEWS Score  MEWS Temp 0  MEWS Systolic 1  MEWS Pulse 0  MEWS RR 0  MEWS LOC 0  MEWS Score 1  MEWS Score Color Green  Assess: if the MEWS score is Yellow or Red  Were vital signs taken at a resting state? Yes  Focused Assessment No change from prior assessment  Early Detection of Sepsis Score *See Row Information* High  MEWS guidelines implemented *See Row Information* No, previously yellow, continue vital signs every 4 hours  Treat  Patients response to intervention Relief  Take Vital Signs  Increase Vital Sign Frequency  Yellow: Q 2hr X 2 then Q 4hr X 2, if remains yellow, continue Q 4hrs  Document  Patient Outcome Stabilized after interventions  Progress note created (see row info) Yes   Patient is now a Green MEWS. PRN medication and ice packs successful in bringing elevated temp down. Will continue to follow previous yellow MEWS protocol for VS. Will continue to monitor the patient.

## 2020-06-18 NOTE — Progress Notes (Signed)
Patient is still in the Green [place]. She A&O x4. Complains of some back pain but states that it is fine as long as she lays still. Patient had some tenderness to the left lower leg during assessment. Non pitting edema to bilateral lower extremities. Will continue to monitor patient.  VS  t 98.5 BP 127/48 (68) HR 78 RR 20 O2 95% [place] 2L

## 2020-06-18 NOTE — Progress Notes (Signed)
   06/18/20 0416  Assess: MEWS Score  Temp 99.2 F (37.3 C)  BP (!) 107/46  Pulse Rate 78  Resp 20  SpO2 97 %  O2 Device Nasal Cannula  Patient Activity (if Appropriate) In bed  O2 Flow Rate (L/min) 2 L/min  Assess: MEWS Score  MEWS Temp 0  MEWS Systolic 0  MEWS Pulse 0  MEWS RR 0  MEWS LOC 0  MEWS Score 0  MEWS Score Color Green  Assess: if the MEWS score is Yellow or Red  Were vital signs taken at a resting state? Yes  Focused Assessment No change from prior assessment  Early Detection of Sepsis Score *See Row Information* High  MEWS guidelines implemented *See Row Information* No, previously yellow, continue vital signs every 4 hours  Treat  MEWS Interventions Administered prn meds/treatments  Pain Scale 0-10  Pain Score 3  Pain Type Chronic pain  Pain Location Back  Pain Orientation Left;Lower  Pain Descriptors / Indicators Sharp  Pain Frequency Intermittent  Pain Onset Sudden  Pain Intervention(s) Medication (See eMAR)  Multiple Pain Sites No  Take Vital Signs  Increase Vital Sign Frequency   (continue yellow MEWS protocol with VS q 4 hours)  Document  Patient Outcome Stabilized after interventions  Progress note created (see row info) Yes   Patient remain Green MEWS, PRN tylenol administered for back pain and temp. VS will continue Q 4 hours under yellow MEWS protocol. Will continue to monitor the patient.

## 2020-06-19 LAB — CULTURE, BLOOD (ROUTINE X 2): Special Requests: ADEQUATE

## 2020-06-19 LAB — CBC
HCT: 33 % — ABNORMAL LOW (ref 36.0–46.0)
Hemoglobin: 9.8 g/dL — ABNORMAL LOW (ref 12.0–15.0)
MCH: 24.2 pg — ABNORMAL LOW (ref 26.0–34.0)
MCHC: 29.7 g/dL — ABNORMAL LOW (ref 30.0–36.0)
MCV: 81.5 fL (ref 80.0–100.0)
Platelets: 156 10*3/uL (ref 150–400)
RBC: 4.05 MIL/uL (ref 3.87–5.11)
RDW: 17.3 % — ABNORMAL HIGH (ref 11.5–15.5)
WBC: 9.1 10*3/uL (ref 4.0–10.5)
nRBC: 0 % (ref 0.0–0.2)

## 2020-06-19 LAB — BASIC METABOLIC PANEL
Anion gap: 8 (ref 5–15)
BUN: 14 mg/dL (ref 8–23)
CO2: 28 mmol/L (ref 22–32)
Calcium: 8.2 mg/dL — ABNORMAL LOW (ref 8.9–10.3)
Chloride: 102 mmol/L (ref 98–111)
Creatinine, Ser: 0.65 mg/dL (ref 0.44–1.00)
GFR calc Af Amer: >60 mL/min (ref >60)
GFR calc non Af Amer: >60 mL/min (ref >60)
Glucose, Bld: 118 mg/dL — ABNORMAL HIGH (ref 70–99)
Potassium: 3.5 mmol/L (ref 3.5–5.1)
Sodium: 138 mmol/L (ref 135–145)

## 2020-06-19 LAB — GLUCOSE, CAPILLARY
Glucose-Capillary: 103 mg/dL — ABNORMAL HIGH (ref 70–99)
Glucose-Capillary: 131 mg/dL — ABNORMAL HIGH (ref 70–99)
Glucose-Capillary: 164 mg/dL — ABNORMAL HIGH (ref 70–99)
Glucose-Capillary: 152 mg/dL — ABNORMAL HIGH (ref 70–99)

## 2020-06-19 NOTE — Progress Notes (Signed)
Patients temp 101, ice packs applied under both arms.

## 2020-06-19 NOTE — Progress Notes (Signed)
[facility]                                                                              Patient Demographics  Kelli Rivera, is a 69 y.o. female, DOB - 1951-08-31, WKM:[id number]  Admit date - 06/16/2020   Admitting Physician Karie Kirks, DO  Outpatient Primary MD for the patient is [name], MD  Outpatient specialists:   LOS - 3  days   Medical records reviewed and are as summarized below:    Chief Complaint  Patient presents with  . Shortness of Breath  . Fever       Brief summary   Kelli P Robertsis a 69 y.o.femalewithhistory of diabetes mellitus type 2, systolic dysfunction, COPD, rheumatoid arthritis presents to the ER because of fever and chills with low back pain. Patient started having fever chills this morning. No associated shortness of breath chest pain nausea vomiting diarrhea or dysuria. Patient has been having low back pain for the last 3 weeks and has been taking over-the-counter medications. Denies any incontinence of bowel or urine. Has pain on moving her left lower extremity and movement is decreased because of the pain.  ED Course:In the ER patient was febrile with temperature 103 F tachycardic with leukocytosis lactic acid was normal. Blood cultures were sent. Chest x-ray UA unremarkable Covid test was negative. CT abdomen pelvis done shows left lumbar fat-containing hernia otherwise nothing acute. Patient started on empiric antibiotics for possible sepsis. Blood culture x 2 positive for strep agalactiae.   Assessment & Plan    Principal Problem: Sepsis with strep agalactiae bacteremia - Patient presented with fever 102.1 F, hypotension, tachypnea, leukocytosis 16.5, blood cultures positive - Patient also complaining of back pain, acute on chronic, MRI lumbar spine negative for acute infection or fluid collection. - 2D echo showed no vegetations. - Left knee x-ray showed no severe effusion, venous Dopplers negative  for DVT - Procalcitonin improving 11.75 (<-21.7), CRP 10.1, ESR mildly elevated at 36 - Repeat blood cultures negative so far - Venous doppler LE negative for DVT - Continue IV penicillin, ID, Dr. Linus Salmons following, will follow recommendations regarding duration of antibiotics (? oral substitute) for disposition   Active Problems: E. coli UTI -Urine culture showed E. coli, sensitive to ampicillin and cephalosporins -Patient has received 3 days of IV Rocephin    Diabetes mellitus type 2 in obese (HCC) -CBG stable, continue Lantus, sliding scale insulin     RA (rheumatoid arthritis) ([place]) -Patient takes Enbrel injections, outpatient follow-up with rheumatology  Anemia of chronic disease -Likely related to rheumatoid arthritis, H&H stable  COPD -Currently stable, no active wheezing   Chronic systolic CHF -2D echo 11/09/5788 showed EF of 40 to 45% -Lasix was held on admission due to low BP, monitor I's and O's, net +4.5 L, restart oral Lasix 40 mg daily -Creatinine stable, 0.6, still net 3.1 L positive, improving   Obesity Estimated body mass index is 46.59 kg/m as calculated from the following:   Height as of this encounter: _0  (1.6 m).   Weight as of this encounter: 119.3 kg.  Code Status: Full code DVT Prophylaxis:  Lovenox Family Communication: Discussed all imaging results, lab results, explained to the patient's daughter on the phone today.  Will order PT OT evaluation as well, patient lives by herself, will be staying with her daughter.   Disposition Plan:     Status is: Inpatient  Remains inpatient appropriate because:Inpatient level of care appropriate due to severity of illness   Dispo: The patient is from: Home              Anticipated d/c is to: Home              Anticipated d/c date is: 2 days              Patient currently is not medically stable to d/c.  Sepsis work-up in progress, awaiting PT evaluation      Time Spent in minutes 35  minutes  Procedures:  MRI lumbar spine  Consultants:   Infectious disease  Antimicrobials:   Anti-infectives (From admission, onward)   Start     Dose/Rate Route Frequency Ordered Stop   06/19/20 0900  penicillin G potassium 12 Million Units in dextrose 5 % 500 mL continuous infusion        12 Million Units 41.7 mL/hr over 12 Hours Intravenous Every 12 hours 06/18/20 1428     06/17/20 1000  vancomycin (VANCOCIN) IVPB 1000 mg/200 mL premix  Status:  Discontinued        1,000 mg 200 mL/hr over 60 Minutes Intravenous Every 12 hours 06/16/20 2352 06/17/20 0927   06/17/20 1000  cefTRIAXone (ROCEPHIN) 2 g in sodium chloride 0.9 % 100 mL IVPB  Status:  Discontinued        2 g 200 mL/hr over 30 Minutes Intravenous Every 24 hours 06/17/20 0927 06/18/20 1428   06/17/20 0000  metroNIDAZOLE (FLAGYL) IVPB 500 mg  Status:  Discontinued        500 mg 100 mL/hr over 60 Minutes Intravenous Every 8 hours 06/16/20 2340 06/17/20 0927   06/17/20 0000  vancomycin (VANCOREADY) IVPB 2000 mg/400 mL        2,000 mg 200 mL/hr over 120 Minutes Intravenous  Once 06/16/20 2352 06/17/20 0325   06/17/20 0000  ceFEPIme (MAXIPIME) 2 g in sodium chloride 0.9 % 100 mL IVPB  Status:  Discontinued        2 g 200 mL/hr over 30 Minutes Intravenous Every 8 hours 06/16/20 2352 06/17/20 0927   06/16/20 2345  ceFEPIme (MAXIPIME) 2 g in sodium chloride 0.9 % 100 mL IVPB  Status:  Discontinued        2 g 200 mL/hr over 30 Minutes Intravenous  Once 06/16/20 2340 06/16/20 2347   06/16/20 2345  vancomycin (VANCOCIN) IVPB 1000 mg/200 mL premix  Status:  Discontinued        1,000 mg 200 mL/hr over 60 Minutes Intravenous  Once 06/16/20 2340 06/16/20 2347   06/16/20 1930  cefTRIAXone (ROCEPHIN) 1 g in sodium chloride 0.9 % 100 mL IVPB  Status:  Discontinued        1 g 200 mL/hr over 30 Minutes Intravenous Every 24 hours 06/16/20 1925 06/16/20 2340         Medications  Scheduled Meds: . aspirin EC  81 mg Oral Daily  .  atorvastatin  80 mg Oral q1800  . enoxaparin (LOVENOX) injection  40 mg Subcutaneous Q24H  . furosemide  40 mg Oral Daily  . insulin aspart  0-9 Units Subcutaneous TID WC  . insulin glargine  20 Units Subcutaneous  QHS  . lidocaine  2 patch Transdermal Q24H  . mometasone-formoterol  2 puff Inhalation BID  . montelukast  10 mg Oral BH-q7a  . oxybutynin  15 mg Oral Daily  . pantoprazole  20 mg Oral BID   Continuous Infusions: . penicillin g continuous IV infusion 12 Million Units (06/19/20 1213)   PRN Meds:.acetaminophen, albuterol, HYDROcodone-acetaminophen, ibuprofen, LORazepam, ondansetron **OR** ondansetron (ZOFRAN) IV      Subjective:   Kelli Rivera was seen and examined today.  Still spiking low-grade fevers overnight.  T-max 101 F.  Still has pain in the back, worse on movement.  Left knee pain is better today.  Swelling in the lower extremities improving.   Patient denies dizziness, chest pain, shortness of breath, abdominal pain, N/V/D/C.  No new weakness numbness or tingling.  No acute events overnight.    Objective:   Vitals:   06/19/20 0005 06/19/20 0150 06/19/20 0346 06/19/20 0751  BP:   (!) 108/50   Pulse:   72   Resp:   18   Temp: (!) 100.4 F (38 C) 98.2 F (36.8 C) 98.7 F (37.1 C)   TempSrc: Oral Oral Oral   SpO2:   97% 96%  Weight:      Height:        Intake/Output Summary (Last 24 hours) at 06/19/2020 1232 Last data filed at 06/18/2020 2337 Gross per 24 hour  Intake --  Output 1350 ml  Net -1350 ml     Wt Readings from Last 3 Encounters:  06/16/20 119.3 kg  06/06/20 120.2 kg  02/26/20 122 kg   Physical Exam  General: Alert and oriented x 3, NAD  Cardiovascular: S1 S2 clear, RRR.  1+ pedal edema b/l  Respiratory: CTAB, no wheezing, rales or rhonchi  Gastrointestinal: Soft, nontender, nondistended, NBS  Ext: 1+ pedal edema bilaterally  Neuro: no new deficits, has tenderness in the lower lumbar region L3-L4,  L4-L5.  Musculoskeletal:  No cyanosis, clubbing  Skin: No rashes  Psych: Normal affect and demeanor, alert and oriented x3     Data Reviewed:  I have personally reviewed following labs and imaging studies  Micro Results Recent Results (from the past 240 hour(s))  Blood Culture (routine x 2)     Status: Abnormal   Collection Time: 06/16/20  8:00 PM   Specimen: BLOOD  Result Value Ref Range Status   Specimen Description   Final    BLOOD LEFT ANTECUBITAL Performed at [facility] [address] [place], [place] [address]    Special Requests   Final    BOTTLES DRAWN AEROBIC AND ANAEROBIC Blood Culture results may not be optimal due to an excessive volume of blood received in culture bottles Performed at [facility] [address], [place], [place] [address]    Culture  Setup Time   Final    GRAM POSITIVE COCCI IN CHAINS IN BOTH AEROBIC AND ANAEROBIC BOTTLES CRITICAL RESULT CALLED TO, READ BACK BY AND VERIFIED WITH: [place] [id number] FCP Performed at [facility], [place] [address], [place], [place] [address]    Culture GROUP B STREP(S.AGALACTIAE)ISOLATED (A)  Final   Report Status 06/19/2020 FINAL  Final   Organism ID, Bacteria GROUP B STREP(S.AGALACTIAE)ISOLATED  Final      Susceptibility   Group b strep(s.agalactiae)isolated - MIC*    CLINDAMYCIN <=0.25 SENSITIVE Sensitive     AMPICILLIN <=0.25 SENSITIVE Sensitive     ERYTHROMYCIN 2 RESISTANT Resistant  VANCOMYCIN 0.5 SENSITIVE Sensitive     CEFTRIAXONE <=0.12 SENSITIVE Sensitive     LEVOFLOXACIN 1 SENSITIVE Sensitive     * GROUP B STREP(S.AGALACTIAE)ISOLATED  Blood Culture ID Panel (Reflexed)     Status: Abnormal   Collection Time: 06/16/20  8:00 PM  Result Value Ref Range Status   Enterococcus faecalis NOT DETECTED NOT DETECTED Final   Enterococcus Faecium NOT DETECTED NOT DETECTED Final   Listeria monocytogenes NOT DETECTED NOT DETECTED Final   Staphylococcus species NOT DETECTED NOT  DETECTED Final   Staphylococcus aureus (BCID) NOT DETECTED NOT DETECTED Final   Staphylococcus epidermidis NOT DETECTED NOT DETECTED Final   Staphylococcus lugdunensis NOT DETECTED NOT DETECTED Final   Streptococcus species DETECTED (A) NOT DETECTED Final    Comment: CRITICAL RESULT CALLED TO, READ BACK BY AND VERIFIED WITH: PHARMD E. Glennon Mac [phone] FCP    Streptococcus agalactiae DETECTED (A) NOT DETECTED Final    Comment: CRITICAL RESULT CALLED TO, READ BACK BY AND VERIFIED WITH: PHARMD E. Glennon Mac [phone] FCP    Streptococcus pneumoniae NOT DETECTED NOT DETECTED Final   Streptococcus pyogenes NOT DETECTED NOT DETECTED Final   A.calcoaceticus-baumannii NOT DETECTED NOT DETECTED Final   Bacteroides fragilis NOT DETECTED NOT DETECTED Final   Enterobacterales NOT DETECTED NOT DETECTED Final   Enterobacter cloacae complex NOT DETECTED NOT DETECTED Final   Escherichia coli NOT DETECTED NOT DETECTED Final   Klebsiella aerogenes NOT DETECTED NOT DETECTED Final   Klebsiella oxytoca NOT DETECTED NOT DETECTED Final   Klebsiella pneumoniae NOT DETECTED NOT DETECTED Final   Proteus species NOT DETECTED NOT DETECTED Final   Salmonella species NOT DETECTED NOT DETECTED Final   Serratia marcescens NOT DETECTED NOT DETECTED Final   Haemophilus influenzae NOT DETECTED NOT DETECTED Final   Neisseria meningitidis NOT DETECTED NOT DETECTED Final   Pseudomonas aeruginosa NOT DETECTED NOT DETECTED Final   Stenotrophomonas maltophilia NOT DETECTED NOT DETECTED Final   Candida albicans NOT DETECTED NOT DETECTED Final   Candida auris NOT DETECTED NOT DETECTED Final   Candida glabrata NOT DETECTED NOT DETECTED Final   Candida krusei NOT DETECTED NOT DETECTED Final   Candida parapsilosis NOT DETECTED NOT DETECTED Final   Candida tropicalis NOT DETECTED NOT DETECTED Final   Cryptococcus neoformans/gattii NOT DETECTED NOT DETECTED Final    Comment: Performed at [facility], [place] [address], [place], [place] [address]  Blood Culture (routine x 2)     Status: Abnormal   Collection Time: 06/16/20  8:10 PM   Specimen: BLOOD RIGHT FOREARM  Result Value Ref Range Status   Specimen Description   Final    BLOOD RIGHT FOREARM Performed at [facility] [address] [place], [place] [address]    Special Requests   Final    BOTTLES DRAWN AEROBIC AND ANAEROBIC Blood Culture adequate volume Performed at [facility] [address], [place], [place] [address]    Culture  Setup Time   Final    GRAM POSITIVE COCCI IN CHAINS IN BOTH AEROBIC AND ANAEROBIC BOTTLES    Culture (A)  Final    GROUP B STREP(S.AGALACTIAE)ISOLATED SUSCEPTIBILITIES PERFORMED ON PREVIOUS CULTURE WITHIN THE LAST 5 DAYS. Performed at [facility], [place] [address] [place], [place] [address]    Report Status 06/19/2020 FINAL  Final  SARS Coronavirus 2 by RT PCR (hospital order, performed in [facility] hospital lab) Nasopharyngeal Nasopharyngeal Swab     Status: None   Collection Time:  06/16/20  8:18 PM   Specimen: Nasopharyngeal Swab  Result Value Ref Range Status   SARS Coronavirus 2 NEGATIVE NEGATIVE Final    Comment: (NOTE) SARS-CoV-2 target nucleic acids are NOT DETECTED.  The SARS-CoV-2 RNA is generally detectable in upper and lower respiratory specimens during the acute phase of infection. The lowest concentration of SARS-CoV-2 viral copies this assay can detect is 250 copies / mL. A negative result does not preclude SARS-CoV-2 infection and should not be used as the sole basis for treatment or other patient management decisions.  A negative result may occur with improper specimen collection / handling, submission of specimen other than nasopharyngeal swab, presence of viral mutation(s) within the areas targeted by this assay, and inadequate number of viral copies (<250 copies / mL). A negative result must be combined with clinical observations, patient  history, and epidemiological information.  Fact Sheet for Patients:   StrictlyIdeas.no  Fact Sheet for Healthcare Providers: BankingDealers.co.za  This test is not yet approved or  cleared by the [place] FDA and has been authorized for detection and/or diagnosis of SARS-CoV-2 by FDA under an Emergency Use Authorization (EUA).  This EUA will remain in effect (meaning this test can be used) for the duration of the COVID-19 declaration under Section 564(b)(1) of the Act, 21 U.S.C. section 360bbb-3(b)(1), unless the authorization is terminated or revoked sooner.  Performed at [facility], [place] [address], [place], [place] [address]   Urine culture     Status: Abnormal   Collection Time: 06/16/20  9:30 PM   Specimen: In/Out Cath Urine  Result Value Ref Range Status   Specimen Description   Final    IN/OUT CATH URINE Performed at [facility] [address], [place], [place] [address]    Special Requests   Final    NONE Performed at [facility], [place] [address], [place], [place] [address]    Culture >=100,000 COLONIES/mL ESCHERICHIA COLI (A)  Final   Report Status 06/18/2020 FINAL  Final   Organism ID, Bacteria ESCHERICHIA COLI (A)  Final      Susceptibility   Escherichia coli - MIC*    AMPICILLIN <=2 SENSITIVE Sensitive     CEFAZOLIN <=4 SENSITIVE Sensitive     CEFTRIAXONE <=0.25 SENSITIVE Sensitive     CIPROFLOXACIN <=0.25 SENSITIVE Sensitive     GENTAMICIN <=1 SENSITIVE Sensitive     IMIPENEM <=0.25 SENSITIVE Sensitive     NITROFURANTOIN <=16 SENSITIVE Sensitive     TRIMETH/SULFA <=20 SENSITIVE Sensitive     AMPICILLIN/SULBACTAM <=2 SENSITIVE Sensitive     PIP/TAZO <=4 SENSITIVE Sensitive     * >=100,000 COLONIES/mL ESCHERICHIA COLI  Culture, blood (Routine X 2) w Reflex to ID Panel     Status: None (Preliminary result)   Collection Time: 06/18/20  9:34 AM    Specimen: BLOOD  Result Value Ref Range Status   Specimen Description   Final    BLOOD RIGHT ANTECUBITAL Performed at [facility] [address] [place], [place] [address]    Special Requests   Final    BOTTLES DRAWN AEROBIC AND ANAEROBIC Blood Culture adequate volume Performed at [facility] [address] [place], [place] [address]    Culture   Final    NO GROWTH < 24 HOURS Performed at [facility] [address], [place], [place] [address]    Report Status PENDING  Incomplete  Culture, blood (Routine X 2) w Reflex  to ID Panel     Status: None (Preliminary result)   Collection Time: 06/18/20  9:36 AM   Specimen: BLOOD LEFT HAND  Result Value Ref Range Status   Specimen Description   Final    BLOOD LEFT HAND Performed at [facility] [address] [place] [address], [place], [place] [address]    Special Requests   Final    BOTTLES DRAWN AEROBIC AND ANAEROBIC Blood Culture adequate volume Performed at [facility] [address] [place], [place] [address]    Culture   Final    NO GROWTH < 24 HOURS Performed at [facility] [address] [place], [place] [address]    Report Status PENDING  Incomplete    Radiology Reports DG Chest 2 View  Result Date: 06/06/2020 CLINICAL DATA:  Indigestion. Elevated D-dimer. No reported shortness of breath. EXAM: CHEST - 2 VIEW COMPARISON:  01/22/2020 FINDINGS: Cardiac silhouette is normal in size. No mediastinal or hilar masses. No evidence of adenopathy. Clear lungs.  No pleural effusion or pneumothorax. Skeletal structures are intact. IMPRESSION: No active cardiopulmonary disease. Electronically Signed   By: Lajean Manes M.D.   On: 06/06/2020 13:32   CT Angio Chest PE W and/or Wo Contrast  Result Date: 06/06/2020 CLINICAL DATA:  Pt with elevated d-dimer sent to ED from PMD's office; pt states she has chronic SOB and leg swelling; diabetic; no h/o PE or  blood clots EXAM: CT ANGIOGRAPHY CHEST WITH CONTRAST TECHNIQUE: Multidetector CT imaging of the chest was performed using the standard protocol during bolus administration of intravenous contrast. Multiplanar CT image reconstructions and MIPs were obtained to evaluate the vascular anatomy. CONTRAST:  148m OMNIPAQUE IOHEXOL 350 MG/ML SOLN COMPARISON:  Current chest radiograph. FINDINGS: Cardiovascular: There is satisfactory opacification of the pulmonary arteries to the segmental level. There is no evidence of a pulmonary embolism. Heart is top-normal in size. No pericardial effusion. No coronary artery calcifications. Aorta is normal in caliber. No dissection mild atherosclerosis along the arch. Branch vessels are widely patent. Mediastinum/Nodes: Thyroid nodules, with evidence of a nodule enlarging the left lobe, at least 2 cm in size. No neck base axillary, mediastinal or hilar masses or enlarged lymph nodes. Trachea and esophagus are unremarkable. Lungs/Pleura: No pneumonia or pulmonary edema. No mass or suspicious nodule. Peripheral interstitial thickening mild changes of centrilobular emphysema. No pleural effusion or pneumothorax. Upper Abdomen: No acute abnormality. Musculoskeletal: No fracture or acute finding.  No bone lesion. Review of the MIP images confirms the above findings. IMPRESSION: 1. No evidence of a pulmonary embolism. 2. No acute findings. 3. Chronic appearing interstitial thickening. Mild centrilobular emphysema. 4. Mild aortic atherosclerosis. Aortic Atherosclerosis (ICD10-I70.0) and Emphysema (ICD10-J43.9). Electronically Signed   By: DLajean ManesM.D.   On: 06/06/2020 14:45   MR Lumbar Spine W Wo Contrast  Result Date: 06/17/2020 CLINICAL DATA:  Low back pain radiating down the left leg with numbness and weakness. EXAM: MRI LUMBAR SPINE WITHOUT AND WITH CONTRAST TECHNIQUE: Multiplanar and multiecho pulse sequences of the lumbar spine were obtained without and with intravenous  contrast. CONTRAST:  119mGADAVIST GADOBUTROL 1 MMOL/ML IV SOLN COMPARISON:  CT scan 06/16/2020 and lumbar MRI from 03/29/2017 FINDINGS: Segmentation: The lowest lumbar type non-rib-bearing vertebra is labeled as L5. Alignment:  No vertebral subluxation is observed. Vertebrae: Type 3 degenerative endplate findings at L4B1-4ith disc desiccation and loss of disc height at this level. Left facet and perifacet edema and enhancement at the L3-4 level attributed  to degenerative arthropathy. No compelling findings of discitis or osteomyelitis. Conus medullaris and cauda equina: Conus extends to the L1-2 level. Conus and cauda equina appear normal, without abnormal enhancement or clumping of the cauda equina to suggest arachnoiditis. Paraspinal and other soft tissues: There is evidence of a chronic left lumbar hernia, with herniation of left perirenal adipose tissue into the subcutaneous tissues, although review of prior studies indicates this to be a chronic hernia, and review of recent CT reveals no significant degree of abnormal associated inflammation. Disc levels: T11-12: Borderline right foraminal stenosis due to disc bulge and right inferior foraminal disc protrusion. This level is only included on the parasagittal images. T12-L1: No impingement.  Small central disc protrusion. L1-2: No impingement. Mild disc bulge with small central annular tear. L2-3: Unremarkable. L3-4: No impingement. Disc bulge and bilateral degenerative facet arthropathy. L4-5: Mild bilateral foraminal stenosis and mild right subarticular lateral recess stenosis due to disc bulge, intervertebral spurring, and mild facet arthropathy. There is also a conjoined appearance of the left L4 and L5 nerve roots which contributes to low position of the left L4 nerve roots in the neural foramen which is likewise factor mild left foraminal stenosis. Impingement at this level is mildly worsened compared to 03/29/2017. L5-S1: No impingement. Small left  paracentral disc protrusion. The broad left transverse process of L5 appears to mildly pseudo articulate with the sacrum, without obvious edema signal to suggest Bertolotti syndrome. IMPRESSION: 1. Lumbar spondylosis and degenerative disc disease causing mild bilateral impingement at L4-5, mildly worsened from 03/29/2017. 2. Left facet and perifacet edema and low-grade enhancement at the L3-4 level is notably increased from 03/29/2017, but probably due to degenerative facet arthropathy given the morphology of the facets. There is no overt facet joint effusion and hence I am skeptical of infected left facet joint at L3-4 as a cause for the appearance. There is no drainable collection in this vicinity. 3. The broad left transverse process of L5 appears to mildly pseudo articulate with the sacrum, but there is no obvious edema signal to suggest Bertolotti syndrome. Electronically Signed   By: Van Clines M.D.   On: 06/17/2020 07:20   CT ABDOMEN PELVIS W CONTRAST  Result Date: 06/16/2020 CLINICAL DATA:  68 year old female with left flank pain. Concern for kidney stone. EXAM: CT ABDOMEN AND PELVIS WITH CONTRAST TECHNIQUE: Multidetector CT imaging of the abdomen and pelvis was performed using the standard protocol following bolus administration of intravenous contrast. CONTRAST:  149m OMNIPAQUE IOHEXOL 300 MG/ML  SOLN COMPARISON:  CT abdomen pelvis dated 01/09/2018. FINDINGS: Lower chest: There are minimal bibasilar atelectasis. No intra-abdominal free air or free fluid. Hepatobiliary: The liver is unremarkable. No intrahepatic biliary dilatation. Cholecystectomy. Pancreas: Unremarkable. No pancreatic ductal dilatation or surrounding inflammatory changes. Spleen: Normal in size without focal abnormality. Adrenals/Urinary Tract: The adrenal glands unremarkable. There is no hydronephrosis on either side. There is symmetric enhancement and excretion of contrast by both kidneys. Subcentimeter left renal inferior  pole hypodense focus is too small to characterize but appears similar to prior CT. The visualized ureters and urinary bladder appear unremarkable. Stomach/Bowel: There is a small hiatal hernia. There is moderate stool throughout the colon. There is no bowel obstruction or active inflammation. There is a 3.5 cm distal duodenal diverticulum. No active inflammatory changes. Appendectomy. Vascular/Lymphatic: The abdominal aorta and IVC unremarkable. No portal venous gas. There is no adenopathy. Reproductive: Hysterectomy. There is a 2 cm left adnexal cyst, similar to prior CT and likely a benign or  indolent process. This can be better evaluated with ultrasound on a nonemergent/outpatient basis if clinically indicated. Other: There is a small fat containing left lumbar hernia similar to prior CT. No associated inflammatory changes. Musculoskeletal: Degenerative changes of the spine. No acute osseous pathology. IMPRESSION: 1. No acute intra-abdominal or pelvic pathology. No hydronephrosis or nephrolithiasis. 2. Small left lumbar fat containing hernia similar to prior CT. No associated inflammatory changes. 3. Moderate colonic stool burden. No bowel obstruction. 4. A 2 cm left adnexal cyst, similar to prior CT and likely a benign or indolent process. Electronically Signed   By: Anner Crete M.D.   On: 06/16/2020 20:06   DG Chest Port 1 View  Result Date: 06/16/2020 CLINICAL DATA:  69 year old female with questionable sepsis. EXAM: PORTABLE CHEST 1 VIEW COMPARISON:  Chest radiograph dated 06/06/2020 and CT dated 06/06/2020 FINDINGS: No focal consolidation, pleural effusion, or pneumothorax. Stable cardiac silhouette. Atherosclerotic calcification of the aortic arch. No acute osseous pathology. IMPRESSION: No active disease. Electronically Signed   By: Anner Crete M.D.   On: 06/16/2020 19:39   VAS [place] LOWER EXTREMITY VENOUS (DVT)  Result Date: 06/18/2020  Lower Venous DVT Study Indications: Pain.  Risk  Factors: None identified. Limitations: Body habitus and poor ultrasound/tissue interface. Comparison Study: No prior studies. Performing Technologist: Oliver Hum RVT  Examination Guidelines: A complete evaluation includes B-mode imaging, spectral Doppler, color Doppler, and power Doppler as needed of all accessible portions of each vessel. Bilateral testing is considered an integral part of a complete examination. Limited examinations for reoccurring indications may be performed as noted. The reflux portion of the exam is performed with the patient in reverse Trendelenburg.  +-----+---------------+---------+-----------+----------+--------------+ RIGHTCompressibilityPhasicitySpontaneityPropertiesThrombus Aging +-----+---------------+---------+-----------+----------+--------------+ CFV  Full           Yes      Yes                                 +-----+---------------+---------+-----------+----------+--------------+   +---------+---------------+---------+-----------+----------+--------------+ LEFT     CompressibilityPhasicitySpontaneityPropertiesThrombus Aging +---------+---------------+---------+-----------+----------+--------------+ CFV      Full           Yes      Yes                                 +---------+---------------+---------+-----------+----------+--------------+ SFJ      Full                                                        +---------+---------------+---------+-----------+----------+--------------+ FV Prox  Full                                                        +---------+---------------+---------+-----------+----------+--------------+ FV Mid   Full                                                        +---------+---------------+---------+-----------+----------+--------------+ FV Distal  Yes      Yes                                 +---------+---------------+---------+-----------+----------+--------------+ PFV       Full                                                        +---------+---------------+---------+-----------+----------+--------------+ POP      Full           Yes      Yes                                 +---------+---------------+---------+-----------+----------+--------------+ PTV      Full                                                        +---------+---------------+---------+-----------+----------+--------------+ PERO     Full                                                        +---------+---------------+---------+-----------+----------+--------------+     Summary: RIGHT: - No evidence of common femoral vein obstruction.  LEFT: - There is no evidence of deep vein thrombosis in the lower extremity. However, portions of this examination were limited- see technologist comments above.  - No cystic structure found in the popliteal fossa.  *See table(s) above for measurements and observations.    Preliminary    ECHOCARDIOGRAM LIMITED  Result Date: 06/18/2020    ECHOCARDIOGRAM LIMITED REPORT   Patient Name:   Kelli Rivera Date of Exam: 06/18/2020 Medical Rec #:  [id number]      Height:       63.0 in Accession #:    [id number]     Weight:       263.0 lb Date of Birth:  December 21, 1950       BSA:          2.172 m Patient Age:    69 years       BP:           127/48 mmHg Patient Gender: F              HR:           78 bpm. Exam Location:  Inpatient Procedure: Limited Echo Indications:    bacteremia  History:        Patient has prior history of Echocardiogram examinations, most                 recent 01/23/2020. COPD; Risk Factors:Diabetes, Dyslipidemia and                 Former Smoker.  Sonographer:    Celene Skeen RDCS (AE) Referring Phys: [id number] ROBERT W COMER  Sonographer Comments: Technically difficult study due to poor echo windows and Technically challenging study due to limited acoustic windows. IMPRESSIONS  1.  Left ventricular ejection fraction, by estimation, is 40 to 45%. The left  ventricle has mildly decreased function. The left ventricle demonstrates global hypokinesis.  2. Right ventricular systolic function is normal. The right ventricular size is normal.  3. The mitral valve is grossly normal. No evidence of mitral valve regurgitation. No evidence of mitral stenosis.  4. The aortic valve is tricuspid. Aortic valve regurgitation is not visualized. No aortic stenosis is present.  5. The inferior vena cava is dilated in size with <50% respiratory variability, suggesting right atrial pressure of 15 mmHg. Comparison(s): No significant change from prior study. Conclusion(s)/Recommendation(s): No evidence of valvular vegetations on this transthoracic echocardiogram. Would recommend a transesophageal echocardiogram to exclude infective endocarditis if clinically indicated. FINDINGS  Left Ventricle: Left ventricular ejection fraction, by estimation, is 40 to 45%. The left ventricle has mildly decreased function. The left ventricle demonstrates global hypokinesis. The left ventricular internal cavity size was normal in size. There is  no left ventricular hypertrophy. Right Ventricle: The right ventricular size is normal. No increase in right ventricular wall thickness. Right ventricular systolic function is normal. Left Atrium: Left atrial size was normal in size. Right Atrium: Right atrial size was normal in size. Pericardium: Trivial pericardial effusion is present. Mitral Valve: The mitral valve is grossly normal. No evidence of mitral valve stenosis. Tricuspid Valve: The tricuspid valve is grossly normal. Tricuspid valve regurgitation is not demonstrated. No evidence of tricuspid stenosis. Aortic Valve: The aortic valve is tricuspid. Aortic valve regurgitation is not visualized. No aortic stenosis is present. Pulmonic Valve: The pulmonic valve was grossly normal. Pulmonic valve regurgitation is not visualized. No evidence of pulmonic stenosis. Venous: The inferior vena cava is dilated in size  with less than 50% respiratory variability, suggesting right atrial pressure of 15 mmHg. IAS/Shunts: The atrial septum is grossly normal. Eleonore Chiquito MD Electronically signed by Eleonore Chiquito MD Signature Date/Time: 06/18/2020/3:16:59 PM    Final    DG Knee 3 Views Left  Result Date: 06/18/2020 CLINICAL DATA:  Pain EXAM: LEFT KNEE - 3 VIEW COMPARISON:  None. FINDINGS: Frontal, tunnel, and lateral views obtained. There is diffuse soft tissue swelling. No fracture or dislocation. There is a small joint effusion. There is generalized joint space narrowing with spurring in all compartments. No erosive changes. IMPRESSION: Generalized osteoarthritic change. Small joint effusion. Soft tissue swelling evident. No fracture or dislocation. No erosion. Electronically Signed   By: Lowella Grip III M.D.   On: 06/18/2020 10:12    Lab Data:  CBC: Recent Labs  Lab 06/16/20 2012 06/16/20 2012 06/16/20 2026 06/17/20 0030 06/17/20 1700 06/18/20 0253 06/19/20 0821  WBC 16.5*  --   --  13.6* 13.1* 11.9* 9.1  NEUTROABS 14.8*  --   --  12.4*  --  8.0*  --   HGB 10.9*   < > 11.9* 9.8* 10.4* 9.8* 9.8*  HCT 36.2   < > 35.0* 32.1* 35.0* 33.4* 33.0*  MCV 81.7  --   --  81.9 83.5 83.7 81.5  PLT 192  --   --  152 146* 162 156   < > = values in this interval not displayed.   Basic Metabolic Panel: Recent Labs  Lab 06/16/20 2012 06/16/20 2012 06/16/20 2026 06/17/20 0030 06/17/20 0607 06/18/20 0253 06/19/20 0821  NA 139  --  142 135  --  137 138  K 4.4  --  5.0 4.0  --  4.3 3.5  CL 109  --  106 106  --  106 102  CO2 25  --   --  23  --  25 28  GLUCOSE 124*  --  120* 154*  --  97 118*  BUN 18  --  21 16  --  15 14  CREATININE 0.75   < > 0.70 0.62 0.77 0.82 0.65  CALCIUM 8.3*  --   --  7.8*  --  8.2* 8.2*   < > = values in this interval not displayed.   GFR: Estimated Creatinine Clearance: 81.8 mL/min (by C-G formula based on SCr of 0.65 mg/dL). Liver Function Tests: Recent Labs  Lab  06/16/20 2012 06/17/20 0030  AST 38 58*  ALT 24 36  ALKPHOS 69 66  BILITOT 0.4 0.7  PROT 6.2* 5.3*  ALBUMIN 3.0* 2.6*   No results for input(s): LIPASE, AMYLASE in the last 168 hours. No results for input(s): AMMONIA in the last 168 hours. Coagulation Profile: Recent Labs  Lab 06/16/20 2012  INR 1.1   Cardiac Enzymes: No results for input(s): CKTOTAL, CKMB, CKMBINDEX, TROPONINI in the last 168 hours. BNP (last 3 results) No results for input(s): PROBNP in the last 8760 hours. HbA1C: No results for input(s): HGBA1C in the last 72 hours. CBG: Recent Labs  Lab 06/18/20 1207 06/18/20 1617 06/18/20 2147 06/19/20 0744 06/19/20 1208  GLUCAP 110* 142* 110* 103* 131*   Lipid Profile: No results for input(s): CHOL, HDL, LDLCALC, TRIG, CHOLHDL, LDLDIRECT in the last 72 hours. Thyroid Function Tests: No results for input(s): TSH, T4TOTAL, FREET4, T3FREE, THYROIDAB in the last 72 hours. Anemia Panel: No results for input(s): VITAMINB12, FOLATE, FERRITIN, TIBC, IRON, RETICCTPCT in the last 72 hours. Urine analysis:    Component Value Date/Time   COLORURINE YELLOW 06/16/2020 2120   APPEARANCEUR CLEAR 06/16/2020 2120   LABSPEC >1.046 (H) 06/16/2020 2120   PHURINE 5.0 06/16/2020 2120   GLUCOSEU NEGATIVE 06/16/2020 2120   HGBUR NEGATIVE 06/16/2020 2120   BILIRUBINUR NEGATIVE 06/16/2020 2120   KETONESUR NEGATIVE 06/16/2020 2120   PROTEINUR NEGATIVE 06/16/2020 2120   NITRITE NEGATIVE 06/16/2020 2120   LEUKOCYTESUR NEGATIVE 06/16/2020 2120     Avenell Sellers M.D. [facility] 06/19/2020, 12:32 PM   Call night coverage person covering after 7pm

## 2020-06-19 NOTE — Care Management Important Message (Signed)
Important Message  Patient Details IM Letter given to the Patient Name: Kelli Rivera MRN: [id number] Date of Birth: November 17, 1950   Medicare Important Message Given:  Yes     Caren Macadam 06/19/2020, 11:15 AM

## 2020-06-19 NOTE — Care Management Important Message (Signed)
Important Message  Patient Details IM Letter given to the Patient Name: Kelli Rivera MRN: [id number] Date of Birth: October 06, 1951   Medicare Important Message Given:  Yes     Caren Macadam 06/19/2020, 11:19 AM

## 2020-06-19 NOTE — Plan of Care (Signed)

## 2020-06-19 NOTE — Progress Notes (Signed)
[facility]   Reason for visit: Follow up on bacteremia  Interval History: no positive findings.  Fever curve trended down and afebrile over 24 hours.  WBC wnl. Back feels better.   Physical Exam: Constitutional:  Vitals:   06/19/20 0751 06/19/20 1354  BP:  (!) 120/51  Pulse:  68  Resp:  18  Temp:  98.4 F (36.9 C)  SpO2: 96% 98%   patient appears in NAD Respiratory: Normal respiratory effort; CTA B Cardiovascular: RRR GI: soft, nt, nd  Review of Systems: Constitutional: negative for fevers and chills Integument/breast: negative for rash  Lab Results  Component Value Date   WBC 9.1 06/19/2020   HGB 9.8 (L) 06/19/2020   HCT 33.0 (L) 06/19/2020   MCV 81.5 06/19/2020   PLT 156 06/19/2020    Lab Results  Component Value Date   CREATININE 0.65 06/19/2020   BUN 14 06/19/2020   NA 138 06/19/2020   K 3.5 06/19/2020   CL 102 06/19/2020   CO2 28 06/19/2020    Lab Results  Component Value Date   ALT 36 06/17/2020   AST 58 (H) 06/17/2020   ALKPHOS 66 06/17/2020     Microbiology: Recent Results (from the past 240 hour(s))  Blood Culture (routine x 2)     Status: Abnormal   Collection Time: 06/16/20  8:00 PM   Specimen: BLOOD  Result Value Ref Range Status   Specimen Description   Final    BLOOD LEFT ANTECUBITAL Performed at [facility], [address], [place], [place] [address]    Special Requests   Final    BOTTLES DRAWN AEROBIC AND ANAEROBIC Blood Culture results may not be optimal due to an excessive volume of blood received in culture bottles Performed at [facility], [address] [place], [place] [address]    Culture  Setup Time   Final    GRAM POSITIVE COCCI IN CHAINS IN BOTH AEROBIC AND ANAEROBIC BOTTLES CRITICAL RESULT CALLED TO, READ BACK BY AND VERIFIED WITH: Spero Curb [phone] Performed at [facility], [address], [place], [place] [address]     Culture GROUP B STREP(S.AGALACTIAE)ISOLATED (A)  Final   Report Status 06/19/2020 FINAL  Final   Organism ID, Bacteria GROUP B STREP(S.AGALACTIAE)ISOLATED  Final      Susceptibility   Group b strep(s.agalactiae)isolated - MIC*    CLINDAMYCIN <=0.25 SENSITIVE Sensitive     AMPICILLIN <=0.25 SENSITIVE Sensitive     ERYTHROMYCIN 2 RESISTANT Resistant     VANCOMYCIN 0.5 SENSITIVE Sensitive     CEFTRIAXONE <=0.12 SENSITIVE Sensitive     LEVOFLOXACIN 1 SENSITIVE Sensitive     * GROUP B STREP(S.AGALACTIAE)ISOLATED  Blood Culture ID Panel (Reflexed)     Status: Abnormal   Collection Time: 06/16/20  8:00 PM  Result Value Ref Range Status   Enterococcus faecalis NOT DETECTED NOT DETECTED Final   Enterococcus Faecium NOT DETECTED NOT DETECTED Final   Listeria monocytogenes NOT DETECTED NOT DETECTED Final   Staphylococcus species NOT DETECTED NOT DETECTED Final   Staphylococcus aureus (BCID) NOT DETECTED NOT DETECTED Final   Staphylococcus epidermidis NOT DETECTED NOT DETECTED Final   Staphylococcus lugdunensis NOT DETECTED NOT DETECTED Final   Streptococcus species DETECTED (A) NOT DETECTED Final    Comment: CRITICAL RESULT CALLED TO, READ BACK BY AND VERIFIED WITH: PHARMD EJean Rosenthal [phone] FCP    Streptococcus agalactiae DETECTED (A) NOT DETECTED Final  Comment: CRITICAL RESULT CALLED TO, READ BACK BY AND VERIFIED WITH: PHARMD E. Jean Rosenthal [phone] FCP    Streptococcus pneumoniae NOT DETECTED NOT DETECTED Final   Streptococcus pyogenes NOT DETECTED NOT DETECTED Final   A.calcoaceticus-baumannii NOT DETECTED NOT DETECTED Final   Bacteroides fragilis NOT DETECTED NOT DETECTED Final   Enterobacterales NOT DETECTED NOT DETECTED Final   Enterobacter cloacae complex NOT DETECTED NOT DETECTED Final   Escherichia coli NOT DETECTED NOT DETECTED Final   Klebsiella aerogenes NOT DETECTED NOT DETECTED Final   Klebsiella oxytoca NOT DETECTED NOT DETECTED Final   Klebsiella pneumoniae NOT  DETECTED NOT DETECTED Final   Proteus species NOT DETECTED NOT DETECTED Final   Salmonella species NOT DETECTED NOT DETECTED Final   Serratia marcescens NOT DETECTED NOT DETECTED Final   Haemophilus influenzae NOT DETECTED NOT DETECTED Final   Neisseria meningitidis NOT DETECTED NOT DETECTED Final   Pseudomonas aeruginosa NOT DETECTED NOT DETECTED Final   Stenotrophomonas maltophilia NOT DETECTED NOT DETECTED Final   Candida albicans NOT DETECTED NOT DETECTED Final   Candida auris NOT DETECTED NOT DETECTED Final   Candida glabrata NOT DETECTED NOT DETECTED Final   Candida krusei NOT DETECTED NOT DETECTED Final   Candida parapsilosis NOT DETECTED NOT DETECTED Final   Candida tropicalis NOT DETECTED NOT DETECTED Final   Cryptococcus neoformans/gattii NOT DETECTED NOT DETECTED Final    Comment: Performed at [facility], [address] [place], [place] [address]  Blood Culture (routine x 2)     Status: Abnormal   Collection Time: 06/16/20  8:10 PM   Specimen: BLOOD RIGHT FOREARM  Result Value Ref Range Status   Specimen Description   Final    BLOOD RIGHT FOREARM Performed at [facility], [address] [place], [place] [address]    Special Requests   Final    BOTTLES DRAWN AEROBIC AND ANAEROBIC Blood Culture adequate volume Performed at [facility], [address], [place], [place] [address]    Culture  Setup Time   Final    GRAM POSITIVE COCCI IN CHAINS IN BOTH AEROBIC AND ANAEROBIC BOTTLES    Culture (A)  Final    GROUP B STREP(S.AGALACTIAE)ISOLATED SUSCEPTIBILITIES PERFORMED ON PREVIOUS CULTURE WITHIN THE LAST 5 DAYS. Performed at [facility], [address], [place], [place] [address]    Report Status 06/19/2020 FINAL  Final  SARS Coronavirus 2 by RT PCR (hospital order, performed in [facility] hospital lab) Nasopharyngeal Nasopharyngeal Swab     Status: None   Collection Time: 06/16/20  8:18 PM   Specimen:  Nasopharyngeal Swab  Result Value Ref Range Status   SARS Coronavirus 2 NEGATIVE NEGATIVE Final    Comment: (NOTE) SARS-CoV-2 target nucleic acids are NOT DETECTED.  The SARS-CoV-2 RNA is generally detectable in upper and lower respiratory specimens during the acute phase of infection. The lowest concentration of SARS-CoV-2 viral copies this assay can detect is 250 copies / mL. A negative result does not preclude SARS-CoV-2 infection and should not be used as the sole basis for treatment or other patient management decisions.  A negative result may occur with improper specimen collection / handling, submission of specimen other than nasopharyngeal swab, presence of viral mutation(s) within the areas targeted by this assay, and inadequate number of viral copies (<250 copies / mL). A negative result must be combined with clinical observations, patient history, and epidemiological information.  Fact Sheet for Patients:   BoilerBrush.com.cy  Fact Sheet for  Healthcare Providers: https://pope.com/  This test is not yet approved or  cleared by the [place] and has been authorized for detection and/or diagnosis of SARS-CoV-2 by FDA under an Emergency Use Authorization (EUA).  This EUA will remain in effect (meaning this test can be used) for the duration of the COVID-19 declaration under Section 564(b)(1) of the Act, 21 U.S.C. section 360bbb-3(b)(1), unless the authorization is terminated or revoked sooner.  Performed at [facility], [address] [place], [place] [address]   Urine culture     Status: Abnormal   Collection Time: 06/16/20  9:30 PM   Specimen: In/Out Cath Urine  Result Value Ref Range Status   Specimen Description   Final    IN/OUT CATH URINE Performed at [facility], [address] [place], [place] [address]    Special Requests   Final    NONE Performed at [facility], [address] [place], [place] [address]    Culture >=100,000 COLONIES/mL ESCHERICHIA COLI (A)  Final   Report Status 06/18/2020 FINAL  Final   Organism ID, Bacteria ESCHERICHIA COLI (A)  Final      Susceptibility   Escherichia coli - MIC*    AMPICILLIN <=2 SENSITIVE Sensitive     CEFAZOLIN <=4 SENSITIVE Sensitive     CEFTRIAXONE <=0.25 SENSITIVE Sensitive     CIPROFLOXACIN <=0.25 SENSITIVE Sensitive     GENTAMICIN <=1 SENSITIVE Sensitive     IMIPENEM <=0.25 SENSITIVE Sensitive     NITROFURANTOIN <=16 SENSITIVE Sensitive     TRIMETH/SULFA <=20 SENSITIVE Sensitive     AMPICILLIN/SULBACTAM <=2 SENSITIVE Sensitive     PIP/TAZO <=4 SENSITIVE Sensitive     * >=100,000 COLONIES/mL ESCHERICHIA COLI  Culture, blood (Routine X 2) w Reflex to ID Panel     Status: None (Preliminary result)   Collection Time: 06/18/20  9:34 AM   Specimen: BLOOD  Result Value Ref Range Status   Specimen Description   Final    BLOOD RIGHT ANTECUBITAL Performed at [facility], [address], [place], [place] [address]    Special Requests   Final    BOTTLES DRAWN AEROBIC AND ANAEROBIC Blood Culture adequate volume Performed at [facility], [address] [place], [place] [address]    Culture   Final    NO GROWTH < 24 HOURS Performed at [facility], [address], [place], [place] [address]    Report Status PENDING  Incomplete  Culture, blood (Routine X 2) w Reflex to ID Panel     Status: None (Preliminary result)   Collection Time: 06/18/20  9:36 AM   Specimen: BLOOD LEFT HAND  Result Value Ref Range Status   Specimen Description   Final    BLOOD LEFT HAND Performed at [facility], [address], [place], [place] [address]    Special Requests   Final    BOTTLES DRAWN AEROBIC AND ANAEROBIC Blood Culture adequate volume Performed at [facility], [address], [place], [place] [address]     Culture   Final    NO GROWTH < 24 HOURS Performed at [facility], [address], [place], [place] [address]    Report Status PENDING  Incomplete    Impression/Plan:  1. Bacteremia with GBS - repeat blood cultures remain ngtd.  Feels well.  No obvious source. Continue penicillin IV and at discharge can use oral amoxicillin 500 mg  three times a day.  2.  Back pain - improving which you would not expect with acute infection and MRI c/w non-infectious process.  Continue with supportive care.  If worsens after discharge off of antibiotics, consider rescan with MRI again.   I will sign off, call with questions

## 2020-06-20 ENCOUNTER — Telehealth: Payer: Self-pay | Admitting: Orthopaedic Surgery

## 2020-06-20 DIAGNOSIS — R6510 Systemic inflammatory response syndrome (SIRS) of non-infectious origin without acute organ dysfunction: Secondary | ICD-10-CM

## 2020-06-20 LAB — GLUCOSE, CAPILLARY
Glucose-Capillary: 151 mg/dL — ABNORMAL HIGH (ref 70–99)
Glucose-Capillary: 155 mg/dL — ABNORMAL HIGH (ref 70–99)
Glucose-Capillary: 144 mg/dL — ABNORMAL HIGH (ref 70–99)
Glucose-Capillary: 161 mg/dL — ABNORMAL HIGH (ref 70–99)

## 2020-06-20 LAB — BASIC METABOLIC PANEL
Anion gap: 8 (ref 5–15)
BUN: 13 mg/dL (ref 8–23)
CO2: 31 mmol/L (ref 22–32)
Calcium: 8.4 mg/dL — ABNORMAL LOW (ref 8.9–10.3)
Chloride: 102 mmol/L (ref 98–111)
Creatinine, Ser: 0.73 mg/dL (ref 0.44–1.00)
GFR calc Af Amer: >60 mL/min (ref >60)
GFR calc non Af Amer: >60 mL/min (ref >60)
Glucose, Bld: 141 mg/dL — ABNORMAL HIGH (ref 70–99)
Potassium: 3.9 mmol/L (ref 3.5–5.1)
Sodium: 141 mmol/L (ref 135–145)

## 2020-06-20 LAB — CBC
HCT: 33.4 % — ABNORMAL LOW (ref 36.0–46.0)
Hemoglobin: 10 g/dL — ABNORMAL LOW (ref 12.0–15.0)
MCH: 24.4 pg — ABNORMAL LOW (ref 26.0–34.0)
MCHC: 29.9 g/dL — ABNORMAL LOW (ref 30.0–36.0)
MCV: 81.5 fL (ref 80.0–100.0)
Platelets: 210 10*3/uL (ref 150–400)
RBC: 4.1 MIL/uL (ref 3.87–5.11)
RDW: 17 % — ABNORMAL HIGH (ref 11.5–15.5)
WBC: 9.7 10*3/uL (ref 4.0–10.5)
nRBC: 0 % (ref 0.0–0.2)

## 2020-06-20 MED ORDER — KETOROLAC TROMETHAMINE 30 MG/ML IJ SOLN
30.0000 mg | Freq: Once | INTRAMUSCULAR | Status: AC
  Administered 2020-06-20: 30 mg via INTRAVENOUS
  Filled 2020-06-20: qty 1

## 2020-06-20 MED ORDER — METHOCARBAMOL 1000 MG/10ML IJ SOLN
500.0000 mg | Freq: Four times a day (QID) | INTRAVENOUS | Status: DC
  Administered 2020-06-20 – 2020-06-22 (×8): 500 mg via INTRAVENOUS
  Filled 2020-06-20: qty 5
  Filled 2020-06-20 (×8): qty 500
  Filled 2020-06-20: qty 5

## 2020-06-20 MED ORDER — KETOROLAC TROMETHAMINE 15 MG/ML IJ SOLN
15.0000 mg | Freq: Four times a day (QID) | INTRAMUSCULAR | Status: DC
  Administered 2020-06-20 – 2020-06-22 (×6): 15 mg via INTRAVENOUS
  Filled 2020-06-20 (×6): qty 1

## 2020-06-20 MED ORDER — ACETAMINOPHEN 500 MG PO TABS
1000.0000 mg | ORAL_TABLET | Freq: Three times a day (TID) | ORAL | Status: DC
  Administered 2020-06-20 – 2020-06-22 (×6): 1000 mg via ORAL
  Filled 2020-06-20 (×6): qty 2

## 2020-06-20 NOTE — Evaluation (Signed)
Physical Therapy Evaluation Patient Details Name: Kelli Rivera MRN: [id number] DOB: Jan 15, 1951 Today's Date: 06/20/2020   History of Present Illness  69 y.o. female with history of diabetes mellitus type 2, systolic dysfunction, COPD, rheumatoid arthritis presents to the ER because of fever and chills with low back pain.  Clinical Impression  Pt admitted with above diagnosis.  PT requiring mod assist +2 to sit EOB, min assist for transfers. Pt is limited by pain and globally deconditioned as well.  May need SNF placement vs HHPT and 24 hr assist. will Continue to follow in acute setting   Pt currently with functional limitations due to the deficits listed below (see PT Problem List). Pt will benefit from skilled PT to increase their independence and safety with mobility to allow discharge to the venue listed below.       Follow Up Recommendations Home health PT;Supervision/Assistance - 24 hour;SNF (pending progress)    Equipment Recommendations  None recommended by PT    Recommendations for Other Services       Precautions / Restrictions Precautions Precautions: Fall Restrictions Weight Bearing Restrictions: No      Mobility  Bed Mobility Overal bed mobility: Needs Assistance Bed Mobility: Rolling;Sidelying to Sit Rolling: Mod assist;+2 for safety/equipment Sidelying to sit: Mod assist;+2 for safety/equipment;+2 for physical assistance       General bed mobility comments: multi-modal cues for log roll technique, assist to complete turn to pt right side, use of bedrail, mod A +2 to elevate trunk and bring bil LEs off bed  Transfers Overall transfer level: Needs assistance Equipment used: Rolling walker (2 wheeled) Transfers: Sit to/from [employer] Sit to Stand: Min assist;+2 physical assistance;+2 safety/equipment Stand pivot transfers: +2 physical assistance;+2 safety/equipment;Min assist       General transfer comment: incr time, assist to maneuver  RW. cues for breathing and posture  Ambulation/Gait             General Gait Details: NT/unable d/t pain  Stairs            Wheelchair Mobility    Modified Rankin (Stroke Patients Only)       Balance Overall balance assessment: Needs assistance   Sitting balance-Leahy Scale: Fair Sitting balance - Comments: difficulty wt shifting d/t pain and body habitus     Standing balance-Leahy Scale: Poor Standing balance comment: reliant on UEs                             Pertinent Vitals/Pain Pain Assessment: 0-10 Pain Score: 7  Pain Location: back and LLE Pain Descriptors / Indicators: Sore;Grimacing Pain Intervention(s): Limited activity within patient's tolerance;Monitored during session;Premedicated before session;Repositioned    Home Living Family/patient expects to be discharged to:: Private residence Living Arrangements: Alone Available Help at Discharge: Family;Available 24 hours/day Type of Home: Apartment Home Access: Level entry   Entrance Stairs-Number of Steps: threshold   Home Equipment: Walker - 2 wheels;Cane - single point;Shower seat;Bedside commode Additional Comments: dtr states she can stay with pt at her home,  dtr's home is 2 levels. dtr works from home    Prior Function Level of Independence: Independent;Independent with assistive device(s)         Comments: cane or RW as needed     Hand Dominance        Extremity/Trunk Assessment   Upper Extremity Assessment Upper Extremity Assessment: Defer to OT evaluation    Lower Extremity Assessment Lower Extremity Assessment:  Generalized weakness;RLE deficits/detail RLE Deficits / Details: bil LEs limited AROM d/t pain       Communication   Communication: No difficulties  Cognition Arousal/Alertness: Awake/alert Behavior During Therapy: WFL for tasks assessed/performed Overall Cognitive Status: Within Functional Limits for tasks assessed                                         General Comments      Exercises     Assessment/Plan    PT Assessment Patient needs continued PT services  PT Problem List Decreased strength;Decreased range of motion;Decreased activity tolerance;Decreased balance;Pain;Decreased mobility       PT Treatment Interventions DME instruction;Therapeutic exercise;Gait training;Functional mobility training;Therapeutic activities;Patient/family education    PT Goals (Current goals can be found in the Care Plan section)  Acute Rehab PT Goals Patient Stated Goal: less pain PT Goal Formulation: With patient Time For Goal Achievement: 06/27/20 Potential to Achieve Goals: Good    Frequency Min 3X/week   Barriers to discharge        Co-evaluation               AM-PAC PT "6 Clicks" Mobility  Outcome Measure Help needed turning from your back to your side while in a flat bed without using bedrails?: A Lot Help needed moving from lying on your back to sitting on the side of a flat bed without using bedrails?: A Lot Help needed moving to and from a bed to a chair (including a wheelchair)?: A Little Help needed standing up from a chair using your arms (e.g., wheelchair or bedside chair)?: A Little Help needed to walk in hospital room?: A Lot Help needed climbing 3-5 steps with a railing? : Total 6 Click Score: 13    End of Session Equipment Utilized During Treatment: Gait belt Activity Tolerance: Patient limited by fatigue;Patient limited by pain Patient left: in chair;with call bell/phone within reach;with chair alarm set;with family/visitor present   PT Visit Diagnosis: Other abnormalities of gait and mobility (R26.89);Difficulty in walking, not elsewhere classified (R26.2)    Time: 1000-1035 PT Time Calculation (min) (ACUTE ONLY): 35 min   Charges:   PT Evaluation $PT Eval Low Complexity: 1 Low PT Treatments $Therapeutic Activity: 8-22 mins        Delice Bison, PT  Acute Rehab Dept ([facility])  [phone] Pager [phone]  06/20/2020   [facility] 06/20/2020, 10:47 AM

## 2020-06-20 NOTE — Telephone Encounter (Signed)
Sent text to Dr Magnus Ivan

## 2020-06-20 NOTE — Consult Note (Signed)
Reason for Consult:  Low back pain Referring Physician: Isidoro Donning, MD - [facility]  Kelli Rivera is an 69 y.o. female.  HPI: The patient is a pleasant 69 year old female that I am seeing for the first time as a consultation due to quite significant low back pain.  She has had back pain in the past but it really flared up the last few days.  She was admitted to the hospital with fever and chills and was found to have bacteremia.  A MRI was obtained of the lumbar spine and I was able to do the lumbar spine from the CT that she had of her abdomen and pelvis as well.  She reports the pain is the lower lumbar spine into the left.  She does have several comorbidities and is a diabetic.  She is also morbidly obese with a BMI of almost 57.  Family is with her at the bedside.  She has never had any spine surgery and has not had any type of interventions on her spine.  Past Medical History:  Diagnosis Date  . Asthma   . Collagen vascular disease (HCC)   . COPD (chronic obstructive pulmonary disease) (HCC)    O2 increased to 3L while ambulating, 2L at rest as of last week  . Diabetes mellitus without complication (HCC)   . Feeling of chest tightness 12/2019  . High cholesterol   . 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 Morbid obesity (HCC)     Past Surgical History:  Procedure Laterality Date  . ABDOMINAL HYSTERECTOMY    . APPENDECTOMY     age 89 yrs  . bladder tuck    . CHOLECYSTECTOMY      Family History  Problem Relation Age of Onset  . Heart failure Mother 72  . Lung cancer Father 76    Social History:  reports that she quit smoking about 14 years ago. She has a 42.00 pack-year smoking history. She has never used smokeless tobacco. She reports that she does not drink alcohol and does not use drugs.  Allergies:  Allergies  Allergen Reactions  . Liraglutide Nausea And Vomiting  . Pioglitazone Swelling  . Amoxicillin-Pot Clavulanate Nausea And  Vomiting    Did it involve swelling of the face/tongue/throat, SOB, or low BP? No Did it involve sudden or severe rash/hives, skin peeling, or any reaction on the inside of your mouth or nose? No Did you need to seek medical attention at a hospital or doctor's office? No When did it last happen?less than 10 years If all above answers are "NO", may proceed with cephalosporin use.     Medications: I have reviewed the patient's current medications.  Results for orders placed or performed during the hospital encounter of 06/16/20 (from the past 48 hour(s))  Glucose, capillary     Status: Abnormal   Collection Time: 06/18/20  9:47 PM  Result Value Ref Range   Glucose-Capillary 110 (H) 70 - 99 mg/dL    Comment: Glucose reference range applies only to samples taken after fasting for at least 8 hours.   Comment 1 Notify RN    Comment 2 Document in Chart   Glucose, capillary     Status: Abnormal   Collection Time: 06/19/20  7:44 AM  Result Value Ref Range   Glucose-Capillary 103 (H) 70 - 99 mg/dL    Comment: Glucose reference range applies only to samples taken after fasting for at least 8  hours.   Comment 1 Notify RN    Comment 2 Document in Chart   CBC     Status: Abnormal   Collection Time: 06/19/20  8:21 AM  Result Value Ref Range   WBC 9.1 4.0 - 10.5 K/uL   RBC 4.05 3.87 - 5.11 MIL/uL   Hemoglobin 9.8 (L) 12.0 - 15.0 g/dL   HCT 49.4 (L) 36 - 46 %   MCV 81.5 80.0 - 100.0 fL   MCH 24.2 (L) 26.0 - 34.0 pg   MCHC 29.7 (L) 30.0 - 36.0 g/dL   RDW 49.6 (H) 75.9 - 16.3 %   Platelets 156 150 - 400 K/uL   nRBC 0.0 0.0 - 0.2 %    Comment: Performed at [facility], [address], [place], [place] [address]  Basic metabolic panel     Status: Abnormal   Collection Time: 06/19/20  8:21 AM  Result Value Ref Range   Sodium 138 135 - 145 mmol/L   Potassium 3.5 3.5 - 5.1 mmol/L    Comment: DELTA CHECK NOTED   Chloride 102 98 - 111 mmol/L   CO2 28 22 - 32 mmol/L    Glucose, Bld 118 (H) 70 - 99 mg/dL    Comment: Glucose reference range applies only to samples taken after fasting for at least 8 hours.   BUN 14 8 - 23 mg/dL   Creatinine, Ser 9.93 0.44 - 1.00 mg/dL   Calcium 8.2 (L) 8.9 - 10.3 mg/dL   GFR calc non Af Amer >60 >60 mL/min   GFR calc Af Amer >60 >60 mL/min   Anion gap 8 5 - 15    Comment: Performed at [facility], [address], [place], [place] [address]  Glucose, capillary     Status: Abnormal   Collection Time: 06/19/20 12:08 PM  Result Value Ref Range   Glucose-Capillary 131 (H) 70 - 99 mg/dL    Comment: Glucose reference range applies only to samples taken after fasting for at least 8 hours.  Glucose, capillary     Status: Abnormal   Collection Time: 06/19/20  4:55 PM  Result Value Ref Range   Glucose-Capillary 164 (H) 70 - 99 mg/dL    Comment: Glucose reference range applies only to samples taken after fasting for at least 8 hours.  Glucose, capillary     Status: Abnormal   Collection Time: 06/19/20  9:07 PM  Result Value Ref Range   Glucose-Capillary 152 (H) 70 - 99 mg/dL    Comment: Glucose reference range applies only to samples taken after fasting for at least 8 hours.  CBC     Status: Abnormal   Collection Time: 06/20/20  3:25 AM  Result Value Ref Range   WBC 9.7 4.0 - 10.5 K/uL   RBC 4.10 3.87 - 5.11 MIL/uL   Hemoglobin 10.0 (L) 12.0 - 15.0 g/dL   HCT 79.3 (L) 36 - 46 %   MCV 81.5 80.0 - 100.0 fL   MCH 24.4 (L) 26.0 - 34.0 pg   MCHC 29.9 (L) 30.0 - 36.0 g/dL   RDW 90.3 (H) 00.9 - 23.3 %   Platelets 210 150 - 400 K/uL   nRBC 0.0 0.0 - 0.2 %    Comment: Performed at [facility], [address], [place], [place] [address]  Basic metabolic panel     Status: Abnormal   Collection Time: 06/20/20  3:25 AM  Result Value Ref Range   Sodium 141  135 - 145 mmol/L   Potassium 3.9 3.5 - 5.1 mmol/L   Chloride 102 98 - 111 mmol/L   CO2 31 22 - 32 mmol/L   Glucose, Bld 141 (H) 70 - 99  mg/dL    Comment: Glucose reference range applies only to samples taken after fasting for at least 8 hours.   BUN 13 8 - 23 mg/dL   Creatinine, Ser 1.61 0.44 - 1.00 mg/dL   Calcium 8.4 (L) 8.9 - 10.3 mg/dL   GFR calc non Af Amer >60 >60 mL/min   GFR calc Af Amer >60 >60 mL/min   Anion gap 8 5 - 15    Comment: Performed at [facility], [address] [place], [place] [address]  Glucose, capillary     Status: Abnormal   Collection Time: 06/20/20  7:49 AM  Result Value Ref Range   Glucose-Capillary 151 (H) 70 - 99 mg/dL    Comment: Glucose reference range applies only to samples taken after fasting for at least 8 hours.  Glucose, capillary     Status: Abnormal   Collection Time: 06/20/20 11:52 AM  Result Value Ref Range   Glucose-Capillary 155 (H) 70 - 99 mg/dL    Comment: Glucose reference range applies only to samples taken after fasting for at least 8 hours.    No results found.  Review of Systems Blood pressure (!) 115/54, pulse (!) 51, temperature 97.9 F (36.6 C), temperature source Oral, resp. rate 16, height 5\' 3"  (1.6 m), weight 119.3 kg, SpO2 97 %. Physical Exam Vitals reviewed.  Constitutional:      Appearance: She is well-developed.  Cardiovascular:     Pulses: Normal pulses.  Musculoskeletal:     Cervical back: Normal range of motion and neck supple.     Lumbar back: Bony tenderness present. Decreased range of motion. Positive left straight leg raise test.  Neurological:     Mental Status: She is alert and oriented to person, place, and time.  Psychiatric:        Behavior: Behavior normal.   There is tenderness to palpation over the lower lumbar spine and to the left.  There is no skin changes in this area.  The patient is able to move her feet and legs.  There is no gross neurologic deficits in the lower extremities.  Assessment/Plan: Low back pain  At this point, the MRI does not show evidence of a discitis or infection involving the facet  joints of the spine.  She does have quite arthritic changes at L4-L5 and L5-S1.  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  At some point, she would benefit from a facet joint injection under direct fluoroscopy with a steroid when she is able to clear any type of bacteremia.  As of now, steroids are contraindicated in light of an infectious process and bacteremia.  For now, she would benefit from supportive care and pain management.  In the long run, weight loss would benefit her quite a bit.  She would eventually benefit also from outpatient physical therapy for her spine.  There are no further recommendations from her orthopedic standpoint during this hospitalization.  I can see her anytime as an outpatient for this problem.  06/20/2020, 4:42 PM

## 2020-06-20 NOTE — Telephone Encounter (Signed)
Dr.Rai with [facility] asks that Dr.Blackman Call her @ [phone]

## 2020-06-20 NOTE — Evaluation (Signed)
Occupational Therapy Evaluation Patient Details Name: Kelli Rivera MRN: [id number] DOB: 1951/02/28 Today's Date: 06/20/2020    History of Present Illness 69 y.o. female with history of diabetes mellitus type 2, systolic dysfunction, COPD, rheumatoid arthritis presents to the ER because of fever and chills with low back pain.   Clinical Impression   [name] is a 69 year old woman who presents with complaints of lower back pain, generalized weakness and decreased activity tolerance resulting in impaired ability to perform functional mobility and ADLs. On evaluation patient's strength test limited due to deferred pain in back but otherwise functional, inability to bend forward due to pain (needed for ADLs) and limited tolerance to ambulate, shift weight and lean. Patient min assist to stand from recliner with RW but max assist for toileting and LB ADLs. Patient requiring seated position for UB ADLs. Patient will benefit from skilled OT services to improve deficits and learn compensatory strategies in order to improve independence. Patient reports she does not want to go to facility at discharge and her daughter will stay with and assist. Patient reports having all needed DME.     Follow Up Recommendations  Home health OT    Equipment Recommendations  None recommended by OT    Recommendations for Other Services       Precautions / Restrictions Precautions Precautions: Fall Precaution Comments: Left sided back pain Restrictions Weight Bearing Restrictions: No      Mobility Bed Mobility               General bed mobility comments: Patient seated in recliner when therapist entered the room.  Transfers Overall transfer level: Needs assistance Equipment used: Rolling walker (2 wheeled) Transfers: Sit to/from [employer] Sit to Stand: Min assist         General transfer comment: Min assist to stand from recliner with RW.    Balance Overall balance  assessment: Needs assistance Sitting-balance support: No upper extremity supported;Feet supported Sitting balance-Leahy Scale: Fair     Standing balance support: During functional activity Standing balance-Leahy Scale: Fair Standing balance comment: Patient can take hands off of walker to manage hospital gown but for limited amount of time.                           ADL either performed or assessed with clinical judgement   ADL Overall ADL's : Needs assistance/impaired Eating/Feeding: Independent   Grooming: Set up;Sitting   Upper Body Bathing: Set up;Sitting   Lower Body Bathing: Maximal assistance;Sit to/from stand;Set up   Upper Body Dressing : Set up;Sitting   Lower Body Dressing: Maximal assistance;Sit to/from stand;Set up   Toilet Transfer: Minimal assistance;BSC;RW;Stand-pivot   Toileting- Clothing Manipulation and Hygiene: Maximal assistance;Sit to/from stand       Functional mobility during ADLs: Minimal assistance;Rolling walker       Vision Patient Visual Report: No change from baseline Vision Assessment?: No apparent visual deficits     Perception     Praxis      Pertinent Vitals/Pain Pain Assessment: Faces Faces Pain Scale: Hurts little more Pain Location: Left lower back Pain Descriptors / Indicators: Sore;Grimacing Pain Intervention(s): Limited activity within patient's tolerance     Hand Dominance     Extremity/Trunk Assessment Upper Extremity Assessment Upper Extremity Assessment: Overall WFL for tasks assessed (Strength testing limited with MMT due to referred pain down back.)   Lower Extremity Assessment Lower Extremity Assessment: Defer to PT evaluation  Communication Communication Communication: No difficulties   Cognition Arousal/Alertness: Awake/alert Behavior During Therapy: WFL for tasks assessed/performed Overall Cognitive Status: Within Functional Limits for tasks assessed                                      General Comments       Exercises     Shoulder Instructions      Home Living Family/patient expects to be discharged to:: Private residence Living Arrangements: Alone Available Help at Discharge: Family;Available 24 hours/day Type of Home: Apartment Home Access: Level entry Entrance Stairs-Number of Steps: threshold                   Home Equipment: Walker - 2 wheels;Cane - single point;Shower seat;Bedside commode   Additional Comments: dtr states she can stay with pt at her home,  dtr's home is 2 levels. dtr works from home      Prior Functioning/Environment Level of Independence: Independent;Independent with assistive device(s)        Comments: caen or RW as needed        OT Problem List: Decreased activity tolerance;Impaired balance (sitting and/or standing);Pain;Obesity;Decreased strength;Decreased knowledge of use of DME or AE      OT Treatment/Interventions: Self-care/ADL training;Therapeutic exercise;DME and/or AE instruction;Therapeutic activities;Balance training;Patient/family education    OT Goals(Current goals can be found in the care plan section) Acute Rehab OT Goals Patient Stated Goal: Decrease pain and return home OT Goal Formulation: With patient Time For Goal Achievement: 07/04/20 Potential to Achieve Goals: Good  OT Frequency: Min 2X/week   Barriers to D/C:            Co-evaluation              AM-PAC OT "6 Clicks" Daily Activity     Outcome Measure Help from another person eating meals?: None Help from another person taking care of personal grooming?: A Little Help from another person toileting, which includes using toliet, bedpan, or urinal?: A Lot Help from another person bathing (including washing, rinsing, drying)?: A Lot Help from another person to put on and taking off regular upper body clothing?: A Little Help from another person to put on and taking off regular lower body clothing?: A Lot 6 Click  Score: 16   End of Session Equipment Utilized During Treatment: Gait belt;Rolling walker Nurse Communication:  (okay to see per RN)  Activity Tolerance: Patient tolerated treatment well Patient left: in chair;with call bell/phone within reach  OT Visit Diagnosis: Unsteadiness on feet (R26.81);Pain;Muscle weakness (generalized) (M62.81) Pain - Right/Left: Left Pain - part of body:  (lower back)                Time: 2956-2130 OT Time Calculation (min): 21 min Charges:  OT General Charges $OT Visit: 1 Visit OT Evaluation $OT Eval Moderate Complexity: 1 Mod  Issiah Huffaker, OTR/L Acute Care Rehab Services  Office [phone] Pager: [phone]   Kelli Churn 06/20/2020, 4:44 PM

## 2020-06-20 NOTE — TOC Initial Note (Addendum)
Transition of Care [facility]) - Initial/Assessment Note    Patient Details  Name: Kelli Rivera MRN: [id number] Date of Birth: [birth year]  [facility] CM/SW Contact:    Kelli Mage, LCSW Phone Number: 06/20/2020, 1:22 PM  Clinical Narrative:   Met with patient in follow up to PT recommendation of SNF/HH PT.  Kelli Rivera states she lives alone in [facility], has a daughter and son who will be involved in her care when she leaves the hospital, has no desire to go to rehab, and has all needed DME at home. States she uses CPAP QHS at home, O2 as needed during the day, and it is provided through [employer].  She gave me permission to call her daughter, who confirmed that she and her brother would trade off being in the home with their mother while she convalesces. She asked me about PCS services, and after confirming that the patient has MCD, I encouraged her to call PCP ASAP to get the ball rolling on getting that application in so that she could be approved.  In the meantime, I called Kelli Rivera with Kelli Rivera who accepted patient for [facility] services. TOC will continue to follow during the course of hospitalization.                Expected Discharge Plan: [place] Barriers to Discharge: No Barriers Identified   Patient Goals and CMS Choice     Choice offered to / list presented to : Patient, Adult Children  Expected Discharge Plan and Services Expected Discharge Plan: [place]   Discharge Planning Services: CM Consult Post Acute Care Choice: [place] arrangements for the past 2 months: Apartment                                      Prior Living Arrangements/Services Living arrangements for the past 2 months: Apartment Lives with:: Self Patient language and need for interpreter reviewed:: Yes Do you feel safe going back to the place where you live?: Yes      Need for Family Participation in Patient Care: Yes (Comment) Care giver  support system in place?: Yes (comment) Current home services: DME Criminal Activity/Legal Involvement Pertinent to Current Situation/Hospitalization: No - Comment as needed  Activities of Daily Living Home Assistive Devices/Equipment: Cane (specify quad or straight), Walker (specify type), Nebulizer, CBG Meter (single point cane, front wheeled walker) ADL Screening (condition at time of admission) Patient's cognitive ability adequate to safely complete daily activities?: Yes Is the patient deaf or have difficulty hearing?: No Does the patient have difficulty seeing, even when wearing glasses/contacts?: No Does the patient have difficulty concentrating, remembering, or making decisions?: No Patient able to express need for assistance with ADLs?: Yes Does the patient have difficulty dressing or bathing?: No Independently performs ADLs?: Yes (appropriate for developmental age) Does the patient have difficulty walking or climbing stairs?: Yes (secondary to shortness of breath) Weakness of Legs: Both Weakness of Arms/Hands: None  Permission Sought/Granted Permission sought to share information with : Family Supports Permission granted to share information with : Yes, Verbal Permission Granted  Share Information with NAME: daughter           Emotional Assessment Appearance:: Appears stated age Attitude/Demeanor/Rapport: Engaged Affect (typically observed): Appropriate Orientation: : Oriented to Self, Oriented to Place, Oriented to  Time, Oriented to Situation Alcohol / Substance Use:  Not Applicable Psych Involvement: No (comment)  Admission diagnosis:  SIRS (systemic inflammatory response syndrome) (HCC) [R65.10] Sepsis, due to unspecified organism, unspecified whether acute organ dysfunction present ([place]) [A41.9] Streptococcal bacteremia [R78.81, B95.5] Patient Active Problem List   Diagnosis Date Noted  . Streptococcal bacteremia 06/17/2020  . SIRS (systemic inflammatory response  syndrome) ([place]) 06/16/2020  . Agatston coronary artery calcium score less than 100   . Type 2 diabetes mellitus without complication, with long-term current use of insulin ([place])   . Chest pain   . NSTEMI (non-ST elevated myocardial infarction) ([place]) 01/22/2020  . Acute respiratory failure with hypoxia ([place]) 01/01/2019  . Chronic diastolic CHF (congestive heart failure) ([place]) 01/01/2019  . Mild renal insufficiency 01/01/2019  . Chronic dermatitis 07/06/2018  . Venous insufficiency of both lower extremities 07/06/2018  . Palmar erythema 07/06/2018  . RA (rheumatoid arthritis) ([place]) 07/06/2018  . DJD (degenerative joint disease) 07/06/2018  . OSA (obstructive sleep apnea) 07/06/2018  . Eosinophilia 07/06/2018  . GERD (gastroesophageal reflux disease) 09/24/2017  . Iron deficiency 09/24/2017  . Leukocytosis 09/23/2016  . Morbid obesity ([place]) 09/23/2016  . COPD with acute exacerbation ([place]) 09/23/2016  . Hyperlipidemia 09/23/2016  . Diabetes mellitus type 2 in obese ([place]) 09/23/2016   PCP:  Kelli Peers, MD Pharmacy:   [facility] [place], [place] [address] Phone: [phone] Fax: [phone]     Social Determinants of Health (SDOH) Interventions    Readmission Risk Interventions No flowsheet data found.

## 2020-06-20 NOTE — Progress Notes (Signed)
[facility]                                                                              Patient Demographics  Kelli Rivera, is a 69 y.o. female, DOB - [birth year], QQV:[id number]  Admit date - 06/16/2020   Admitting Physician Karie Kirks, DO  Outpatient Primary MD for the patient is [name], MD  Outpatient specialists:   LOS - 4  days   Medical records reviewed and are as summarized below:    Chief Complaint  Patient presents with  . Shortness of Breath  . Fever       Brief summary   Kelli P Robertsis a 69 y.o.femalewithhistory of diabetes mellitus type 2, systolic dysfunction, COPD, rheumatoid arthritis presents to the ER because of fever and chills with low back pain. Patient started having fever chills this morning. No associated shortness of breath chest pain nausea vomiting diarrhea or dysuria. Patient has been having low back pain for the last 3 weeks and has been taking over-the-counter medications. Denies any incontinence of bowel or urine. Has pain on moving her left lower extremity and movement is decreased because of the pain.  ED Course:In the ER patient was febrile with temperature 103 F tachycardic with leukocytosis lactic acid was normal. Blood cultures were sent. Chest x-ray UA unremarkable Covid test was negative. CT abdomen pelvis done shows left lumbar fat-containing hernia otherwise nothing acute. Patient started on empiric antibiotics for possible sepsis. Blood culture x 2 positive for strep agalactiae.   Assessment & Plan    Principal Problem: Sepsis with strep agalactiae bacteremia - Patient presented with fever 102.1 F, hypotension, tachypnea, leukocytosis 16.5, blood cultures positive - Patient also complaining of back pain, acute on chronic, MRI lumbar spine negative for acute infection or fluid collection. - 2D echo showed no vegetations. - Left knee x-ray showed no severe effusion, venous Dopplers negative  for DVT - Procalcitonin improving 11.75 (<-21.7), CRP 10.1, ESR mildly elevated at 36 - Repeat blood cultures negative so far - Venous doppler LE negative for DVT -Discussed with ID, Dr. Linus Salmons, continue IV penicillin and discharged on oral amoxicillin 500 mg 3 times daily for 7 more days   Active Problems: Acute on chronic back pain -Per patient back pain was improving yesterday however walked with PT, was sitting up in the chair and today it is worsened flared up.  She has underlying history of rheumatoid arthritis. -Please start on scheduled Tylenol, Toradol 30 mg IV x1, Lidoderm patch, Robaxin, continue Norco as needed for pain -Discussed with ID, Dr Linus Salmons, repeating MRI of the spine is not needed as no worsening in the clinical status, no leukocytosis or fevers, no FND's, will likely benefit repeating MRI in a week after completing antibiotics. -Requested orthopedics consult, d/w Dr. Ardyth Gal. coli UTI -Urine culture showed E. coli, sensitive to ampicillin and cephalosporins -Patient has received 3 days of IV Rocephin    Diabetes mellitus type 2 in obese ([place]) -CBGs in 140s- 150s, continue Lantus, sliding scale insulin     RA (rheumatoid arthritis) ([place]) -Patient takes Enbrel injections, outpatient follow-up with rheumatology outpatient  Anemia of chronic disease -Likely related to rheumatoid arthritis, stable  COPD -Currently stable, no active wheezing   Chronic systolic CHF -2D echo 7/91/5056 showed EF of 40 to 45% -Lasix was held on admission due to low BP, monitor I's and O's, net +4.5 L, restart oral Lasix 40 mg daily -Creatinine stable, 0.6, still net 3.1 L positive, improving   Obesity Estimated body mass index is 46.59 kg/m as calculated from the following:   Height as of this encounter: $RemoveBeforeD'5\' 3"'ltlxrjCdlYaoFl$  (1.6 m).   Weight as of this encounter: 119.3 kg.  Code Status: Full code DVT Prophylaxis:  Lovenox Family Communication: Discussed all imaging results, lab  results, explained to the patient's daughter on the phone on 8/26   Disposition Plan:     Status is: Inpatient  Remains inpatient appropriate because:Inpatient level of care appropriate due to severity of illness   Dispo: The patient is from: Home              Anticipated d/c is to: Home              Anticipated d/c date is: 2 days              Patient currently is not medically stable to d/c.  Back pain uncontrolled today, await orthopedics recommendation      Time Spent in minutes 35 minutes  Procedures:  MRI lumbar spine  Consultants:   Infectious disease  Antimicrobials:   Anti-infectives (From admission, onward)   Start     Dose/Rate Route Frequency Ordered Stop   06/19/20 0900  penicillin G potassium 12 Million Units in dextrose 5 % 500 mL continuous infusion        12 Million Units 41.7 mL/hr over 12 Hours Intravenous Every 12 hours 06/18/20 1428     06/17/20 1000  vancomycin (VANCOCIN) IVPB 1000 mg/200 mL premix  Status:  Discontinued        1,000 mg 200 mL/hr over 60 Minutes Intravenous Every 12 hours 06/16/20 2352 06/17/20 0927   06/17/20 1000  cefTRIAXone (ROCEPHIN) 2 g in sodium chloride 0.9 % 100 mL IVPB  Status:  Discontinued        2 g 200 mL/hr over 30 Minutes Intravenous Every 24 hours 06/17/20 0927 06/18/20 1428   06/17/20 0000  metroNIDAZOLE (FLAGYL) IVPB 500 mg  Status:  Discontinued        500 mg 100 mL/hr over 60 Minutes Intravenous Every 8 hours 06/16/20 2340 06/17/20 0927   06/17/20 0000  vancomycin (VANCOREADY) IVPB 2000 mg/400 mL        2,000 mg 200 mL/hr over 120 Minutes Intravenous  Once 06/16/20 2352 06/17/20 0325   06/17/20 0000  ceFEPIme (MAXIPIME) 2 g in sodium chloride 0.9 % 100 mL IVPB  Status:  Discontinued        2 g 200 mL/hr over 30 Minutes Intravenous Every 8 hours 06/16/20 2352 06/17/20 0927   06/16/20 2345  ceFEPIme (MAXIPIME) 2 g in sodium chloride 0.9 % 100 mL IVPB  Status:  Discontinued        2 g 200 mL/hr over 30 Minutes  Intravenous  Once 06/16/20 2340 06/16/20 2347   06/16/20 2345  vancomycin (VANCOCIN) IVPB 1000 mg/200 mL premix  Status:  Discontinued        1,000 mg 200 mL/hr over 60 Minutes Intravenous  Once 06/16/20 2340 06/16/20 2347   06/16/20 1930  cefTRIAXone (ROCEPHIN) 1 g in sodium chloride 0.9 % 100 mL IVPB  Status:  Discontinued        1 g 200 mL/hr over 30 Minutes Intravenous Every 24 hours 06/16/20 1925 06/16/20 2340         Medications  Scheduled Meds: . acetaminophen  1,000 mg Oral Q8H  . aspirin EC  81 mg Oral Daily  . atorvastatin  80 mg Oral q1800  . enoxaparin (LOVENOX) injection  40 mg Subcutaneous Q24H  . furosemide  40 mg Oral Daily  . insulin aspart  0-9 Units Subcutaneous TID WC  . insulin glargine  20 Units Subcutaneous QHS  . lidocaine  2 patch Transdermal Q24H  . mometasone-formoterol  2 puff Inhalation BID  . montelukast  10 mg Oral BH-q7a  . oxybutynin  15 mg Oral Daily  . pantoprazole  20 mg Oral BID   Continuous Infusions: . methocarbamol (ROBAXIN) IV 500 mg (06/20/20 1326)  . penicillin g continuous IV infusion 12 Million Units (06/20/20 0257)   PRN Meds:.acetaminophen, albuterol, HYDROcodone-acetaminophen, ibuprofen, LORazepam, ondansetron **OR** ondansetron (ZOFRAN) IV      Subjective:   Kelli Rivera was seen and examined today.  Afebrile, reports back pain worse today on moving, worked with PT yesterday and sitting up in the chair.  Left knee pain is better.    Patient denies dizziness, chest pain, shortness of breath, abdominal pain, N/V/D/C.  No new FND's, no numbness or tingling, no urinary or bowel incontinence.     Objective:   Vitals:   06/19/20 1354 06/19/20 1953 06/19/20 2106 06/20/20 0500  BP: (!) 120/51  (!) 116/58 (!) 112/54  Pulse: 68  71 64  Resp: $Remo'18  16 16  'vrjEW$ Temp: 98.4 F (36.9 C)  98.9 F (37.2 C) 98 F (36.7 C)  TempSrc: Oral     SpO2: 98% 95% 94% 95%  Weight:      Height:        Intake/Output Summary (Last 24 hours) at  06/20/2020 1505 Last data filed at 06/20/2020 0759 Gross per 24 hour  Intake 856.46 ml  Output 2150 ml  Net -1293.54 ml     Wt Readings from Last 3 Encounters:  06/16/20 119.3 kg  06/06/20 120.2 kg  02/26/20 122 kg   Physical Exam  General: Alert and oriented x 3, NAD  Cardiovascular: S1 S2 clear, RRR.  1+ pedal edema b/l  Respiratory: CTAB  Gastrointestinal: Soft, nontender, nondistended, NBS  Ext: 1+ pedal edema bilaterally  Neuro: no new deficits, has tenderness in lower lumbar region  Musculoskeletal: No cyanosis, clubbing  Skin: No rashes  Psych: Normal affect and demeanor, alert and oriented x3      Data Reviewed:  I have personally reviewed following labs and imaging studies  Micro Results Recent Results (from the past 240 hour(s))  Blood Culture (routine x 2)     Status: Abnormal   Collection Time: 06/16/20  8:00 PM   Specimen: BLOOD  Result Value Ref Range Status   Specimen Description   Final    BLOOD LEFT ANTECUBITAL Performed at [facility] [address] [place], [place] [address]    Special Requests   Final    BOTTLES DRAWN AEROBIC AND ANAEROBIC Blood Culture results may not be optimal due to an excessive volume of blood received in culture bottles Performed at [facility] [address] [place], [place] [address]    Culture  Setup Time   Final    GRAM POSITIVE COCCI IN CHAINS IN BOTH AEROBIC AND ANAEROBIC BOTTLES CRITICAL RESULT CALLED TO,  READ BACK BY AND VERIFIED WITH: Daniels [id number] FCP Performed at [facility], [place] [address] [place], [place] [address]    Culture GROUP B STREP(S.AGALACTIAE)ISOLATED (A)  Final   Report Status 06/19/2020 FINAL  Final   Organism ID, Bacteria GROUP B STREP(S.AGALACTIAE)ISOLATED  Final      Susceptibility   Group b strep(s.agalactiae)isolated - MIC*    CLINDAMYCIN <=0.25 SENSITIVE Sensitive     AMPICILLIN <=0.25 SENSITIVE Sensitive      ERYTHROMYCIN 2 RESISTANT Resistant     VANCOMYCIN 0.5 SENSITIVE Sensitive     CEFTRIAXONE <=0.12 SENSITIVE Sensitive     LEVOFLOXACIN 1 SENSITIVE Sensitive     * GROUP B STREP(S.AGALACTIAE)ISOLATED  Blood Culture ID Panel (Reflexed)     Status: Abnormal   Collection Time: 06/16/20  8:00 PM  Result Value Ref Range Status   Enterococcus faecalis NOT DETECTED NOT DETECTED Final   Enterococcus Faecium NOT DETECTED NOT DETECTED Final   Listeria monocytogenes NOT DETECTED NOT DETECTED Final   Staphylococcus species NOT DETECTED NOT DETECTED Final   Staphylococcus aureus (BCID) NOT DETECTED NOT DETECTED Final   Staphylococcus epidermidis NOT DETECTED NOT DETECTED Final   Staphylococcus lugdunensis NOT DETECTED NOT DETECTED Final   Streptococcus species DETECTED (A) NOT DETECTED Final    Comment: CRITICAL RESULT CALLED TO, READ BACK BY AND VERIFIED WITH: PHARMD E. [place] Mac [phone] FCP    Streptococcus agalactiae DETECTED (A) NOT DETECTED Final    Comment: CRITICAL RESULT CALLED TO, READ BACK BY AND VERIFIED WITH: PHARMD E. [place] Mac [phone] [id number] FCP    Streptococcus pneumoniae NOT DETECTED NOT DETECTED Final   Streptococcus pyogenes NOT DETECTED NOT DETECTED Final   A.calcoaceticus-baumannii NOT DETECTED NOT DETECTED Final   Bacteroides fragilis NOT DETECTED NOT DETECTED Final   Enterobacterales NOT DETECTED NOT DETECTED Final   Enterobacter cloacae complex NOT DETECTED NOT DETECTED Final   Escherichia coli NOT DETECTED NOT DETECTED Final   Klebsiella aerogenes NOT DETECTED NOT DETECTED Final   Klebsiella oxytoca NOT DETECTED NOT DETECTED Final   Klebsiella pneumoniae NOT DETECTED NOT DETECTED Final   Proteus species NOT DETECTED NOT DETECTED Final   Salmonella species NOT DETECTED NOT DETECTED Final   Serratia marcescens NOT DETECTED NOT DETECTED Final   Haemophilus influenzae NOT DETECTED NOT DETECTED Final   Neisseria meningitidis NOT DETECTED NOT DETECTED Final   Pseudomonas  aeruginosa NOT DETECTED NOT DETECTED Final   Stenotrophomonas maltophilia NOT DETECTED NOT DETECTED Final   Candida albicans NOT DETECTED NOT DETECTED Final   Candida auris NOT DETECTED NOT DETECTED Final   Candida glabrata NOT DETECTED NOT DETECTED Final   Candida krusei NOT DETECTED NOT DETECTED Final   Candida parapsilosis NOT DETECTED NOT DETECTED Final   Candida tropicalis NOT DETECTED NOT DETECTED Final   Cryptococcus neoformans/gattii NOT DETECTED NOT DETECTED Final    Comment: Performed at [facility], [place] [address], [place], [place] [address]  Blood Culture (routine x 2)     Status: Abnormal   Collection Time: 06/16/20  8:10 PM   Specimen: BLOOD RIGHT FOREARM  Result Value Ref Range Status   Specimen Description   Final    BLOOD RIGHT FOREARM Performed at [facility] [address] [place], [place] [address]    Special Requests   Final    BOTTLES DRAWN AEROBIC AND ANAEROBIC Blood Culture adequate volume Performed at [facility] [address], [place], [place] [address]    Culture  Setup Time   Final    GRAM POSITIVE COCCI IN CHAINS IN BOTH AEROBIC AND ANAEROBIC BOTTLES    Culture (A)  Final    GROUP B STREP(S.AGALACTIAE)ISOLATED SUSCEPTIBILITIES PERFORMED ON PREVIOUS CULTURE WITHIN THE LAST 5 DAYS. Performed at [facility], [place] [address] [place], [place] [address]    Report Status 06/19/2020 FINAL  Final  SARS Coronavirus 2 by RT PCR (hospital order, performed in [facility] hospital lab) Nasopharyngeal Nasopharyngeal Swab     Status: None   Collection Time: 06/16/20  8:18 PM   Specimen: Nasopharyngeal Swab  Result Value Ref Range Status   SARS Coronavirus 2 NEGATIVE NEGATIVE Final    Comment: (NOTE) SARS-CoV-2 target nucleic acids are NOT DETECTED.  The SARS-CoV-2 RNA is generally detectable in upper and lower respiratory specimens during the acute phase of infection. The lowest concentration of  SARS-CoV-2 viral copies this assay can detect is 250 copies / mL. A negative result does not preclude SARS-CoV-2 infection and should not be used as the sole basis for treatment or other patient management decisions.  A negative result may occur with improper specimen collection / handling, submission of specimen other than nasopharyngeal swab, presence of viral mutation(s) within the areas targeted by this assay, and inadequate number of viral copies (<250 copies / mL). A negative result must be combined with clinical observations, patient history, and epidemiological information.  Fact Sheet for Patients:   StrictlyIdeas.no  Fact Sheet for Healthcare Providers: BankingDealers.co.za  This test is not yet approved or  cleared by the [place] FDA and has been authorized for detection and/or diagnosis of SARS-CoV-2 by FDA under an Emergency Use Authorization (EUA).  This EUA will remain in effect (meaning this test can be used) for the duration of the COVID-19 declaration under Section 564(b)(1) of the Act, 21 U.S.C. section 360bbb-3(b)(1), unless the authorization is terminated or revoked sooner.  Performed at [facility], [place] [address], [place], [place] [address]   Urine culture     Status: Abnormal   Collection Time: 06/16/20  9:30 PM   Specimen: In/Out Cath Urine  Result Value Ref Range Status   Specimen Description   Final    IN/OUT CATH URINE Performed at [facility] [address], [place], [place] [address]    Special Requests   Final    NONE Performed at [facility], [place] [address], [place], [place] [address]    Culture >=100,000 COLONIES/mL ESCHERICHIA COLI (A)  Final   Report Status 06/18/2020 FINAL  Final   Organism ID, Bacteria ESCHERICHIA COLI (A)  Final      Susceptibility   Escherichia coli - MIC*    AMPICILLIN <=2 SENSITIVE Sensitive      CEFAZOLIN <=4 SENSITIVE Sensitive     CEFTRIAXONE <=0.25 SENSITIVE Sensitive     CIPROFLOXACIN <=0.25 SENSITIVE Sensitive     GENTAMICIN <=1 SENSITIVE Sensitive     IMIPENEM <=0.25 SENSITIVE Sensitive     NITROFURANTOIN <=16 SENSITIVE Sensitive     TRIMETH/SULFA <=20 SENSITIVE Sensitive     AMPICILLIN/SULBACTAM <=2 SENSITIVE Sensitive     PIP/TAZO <=4 SENSITIVE Sensitive     * >=100,000 COLONIES/mL ESCHERICHIA COLI  Culture, blood (Routine X 2) w Reflex to ID Panel     Status: None (Preliminary result)   Collection Time: 06/18/20  9:34 AM   Specimen: BLOOD  Result Value Ref Range Status   Specimen Description   Final    BLOOD  RIGHT ANTECUBITAL Performed at [facility], [address] [place], [place] [address]    Special Requests   Final    BOTTLES DRAWN AEROBIC AND ANAEROBIC Blood Culture adequate volume Performed at [facility], [address] [place], [place] [address]    Culture   Final    NO GROWTH 2 DAYS Performed at [facility], [address] [place], [place] [address]    Report Status PENDING  Incomplete  Culture, blood (Routine X 2) w Reflex to ID Panel     Status: None (Preliminary result)   Collection Time: 06/18/20  9:36 AM   Specimen: BLOOD LEFT HAND  Result Value Ref Range Status   Specimen Description   Final    BLOOD LEFT HAND Performed at [facility], [address], [place], [place] [address]    Special Requests   Final    BOTTLES DRAWN AEROBIC AND ANAEROBIC Blood Culture adequate volume Performed at [facility], [address], [place], [place] [address]    Culture   Final    NO GROWTH 2 DAYS Performed at [facility], [address] [place], [place] [address]    Report Status PENDING  Incomplete    Radiology Reports DG Chest 2 View  Result Date: 06/06/2020 CLINICAL DATA:  Indigestion. Elevated D-dimer. No reported shortness of breath. EXAM: CHEST - 2  VIEW COMPARISON:  01/22/2020 FINDINGS: Cardiac silhouette is normal in size. No mediastinal or hilar masses. No evidence of adenopathy. Clear lungs.  No pleural effusion or pneumothorax. Skeletal structures are intact. IMPRESSION: No active cardiopulmonary disease. Electronically Signed   By: Amie Portland M.D.   On: 06/06/2020 13:32   CT Angio Chest PE W and/or Wo Contrast  Result Date: 06/06/2020 CLINICAL DATA:  Pt with elevated d-dimer sent to ED from PMD's office; pt states she has chronic SOB and leg swelling; diabetic; no h/o PE or blood clots EXAM: CT ANGIOGRAPHY CHEST WITH CONTRAST TECHNIQUE: Multidetector CT imaging of the chest was performed using the standard protocol during bolus administration of intravenous contrast. Multiplanar CT image reconstructions and MIPs were obtained to evaluate the vascular anatomy. CONTRAST:  OMNIPAQUE IOHEXOL 350 MG/ML SOLN COMPARISON:  Current chest radiograph. FINDINGS: Cardiovascular: There is satisfactory opacification of the pulmonary arteries to the segmental level. There is no evidence of a pulmonary embolism. Heart is top-normal in size. No pericardial effusion. No coronary artery calcifications. Aorta is normal in caliber. No dissection mild atherosclerosis along the arch. Branch vessels are widely patent. Mediastinum/Nodes: Thyroid nodules, with evidence of a nodule enlarging the left lobe, at least 2 cm in size. No neck base axillary, mediastinal or hilar masses or enlarged lymph nodes. Trachea and esophagus are unremarkable. Lungs/Pleura: No pneumonia or pulmonary edema. No mass or suspicious nodule. Peripheral interstitial thickening mild changes of centrilobular emphysema. No pleural effusion or pneumothorax. Upper Abdomen: No acute abnormality. Musculoskeletal: No fracture or acute finding.  No bone lesion. Review of the MIP images confirms the above findings. IMPRESSION: 1. No evidence of a pulmonary embolism. 2. No acute findings. 3. Chronic  appearing interstitial thickening. Mild centrilobular emphysema. 4. Mild aortic atherosclerosis. Aortic Atherosclerosis (ICD10-I70.0) and Emphysema (ICD10-J43.9). Electronically Signed   By: Amie Portland M.D.   On: 06/06/2020 14:45   MR Lumbar Spine W Wo Contrast  Result Date: 06/17/2020 CLINICAL DATA:  Low back pain radiating down the left leg with numbness and weakness. EXAM: MRI LUMBAR SPINE  WITHOUT AND WITH CONTRAST TECHNIQUE: Multiplanar and multiecho pulse sequences of the lumbar spine were obtained without and with intravenous contrast. CONTRAST:  61mL GADAVIST GADOBUTROL 1 MMOL/ML IV SOLN COMPARISON:  CT scan 06/16/2020 and lumbar MRI from 03/29/2017 FINDINGS: Segmentation: The lowest lumbar type non-rib-bearing vertebra is labeled as L5. Alignment:  No vertebral subluxation is observed. Vertebrae: Type 3 degenerative endplate findings at X3-2 with disc desiccation and loss of disc height at this level. Left facet and perifacet edema and enhancement at the L3-4 level attributed to degenerative arthropathy. No compelling findings of discitis or osteomyelitis. Conus medullaris and cauda equina: Conus extends to the L1-2 level. Conus and cauda equina appear normal, without abnormal enhancement or clumping of the cauda equina to suggest arachnoiditis. Paraspinal and other soft tissues: There is evidence of a chronic left lumbar hernia, with herniation of left perirenal adipose tissue into the subcutaneous tissues, although review of prior studies indicates this to be a chronic hernia, and review of recent CT reveals no significant degree of abnormal associated inflammation. Disc levels: T11-12: Borderline right foraminal stenosis due to disc bulge and right inferior foraminal disc protrusion. This level is only included on the parasagittal images. T12-L1: No impingement.  Small central disc protrusion. L1-2: No impingement. Mild disc bulge with small central annular tear. L2-3: Unremarkable. L3-4: No  impingement. Disc bulge and bilateral degenerative facet arthropathy. L4-5: Mild bilateral foraminal stenosis and mild right subarticular lateral recess stenosis due to disc bulge, intervertebral spurring, and mild facet arthropathy. There is also a conjoined appearance of the left L4 and L5 nerve roots which contributes to low position of the left L4 nerve roots in the neural foramen which is likewise factor mild left foraminal stenosis. Impingement at this level is mildly worsened compared to 03/29/2017. L5-S1: No impingement. Small left paracentral disc protrusion. The broad left transverse process of L5 appears to mildly pseudo articulate with the sacrum, without obvious edema signal to suggest Bertolotti syndrome. IMPRESSION: 1. Lumbar spondylosis and degenerative disc disease causing mild bilateral impingement at L4-5, mildly worsened from 03/29/2017. 2. Left facet and perifacet edema and low-grade enhancement at the L3-4 level is notably increased from 03/29/2017, but probably due to degenerative facet arthropathy given the morphology of the facets. There is no overt facet joint effusion and hence I am skeptical of infected left facet joint at L3-4 as a cause for the appearance. There is no drainable collection in this vicinity. 3. The broad left transverse process of L5 appears to mildly pseudo articulate with the sacrum, but there is no obvious edema signal to suggest Bertolotti syndrome. Electronically Signed   By: Van Clines M.D.   On: 06/17/2020 07:20   CT ABDOMEN PELVIS W CONTRAST  Result Date: 06/16/2020 CLINICAL DATA:  69 year old female with left flank pain. Concern for kidney stone. EXAM: CT ABDOMEN AND PELVIS WITH CONTRAST TECHNIQUE: Multidetector CT imaging of the abdomen and pelvis was performed using the standard protocol following bolus administration of intravenous contrast. CONTRAST:  153mL OMNIPAQUE IOHEXOL 300 MG/ML  SOLN COMPARISON:  CT abdomen pelvis dated 01/09/2018.  FINDINGS: Lower chest: There are minimal bibasilar atelectasis. No intra-abdominal free air or free fluid. Hepatobiliary: The liver is unremarkable. No intrahepatic biliary dilatation. Cholecystectomy. Pancreas: Unremarkable. No pancreatic ductal dilatation or surrounding inflammatory changes. Spleen: Normal in size without focal abnormality. Adrenals/Urinary Tract: The adrenal glands unremarkable. There is no hydronephrosis on either side. There is symmetric enhancement and excretion of contrast by both kidneys. Subcentimeter left renal inferior pole  hypodense focus is too small to characterize but appears similar to prior CT. The visualized ureters and urinary bladder appear unremarkable. Stomach/Bowel: There is a small hiatal hernia. There is moderate stool throughout the colon. There is no bowel obstruction or active inflammation. There is a 3.5 cm distal duodenal diverticulum. No active inflammatory changes. Appendectomy. Vascular/Lymphatic: The abdominal aorta and IVC unremarkable. No portal venous gas. There is no adenopathy. Reproductive: Hysterectomy. There is a 2 cm left adnexal cyst, similar to prior CT and likely a benign or indolent process. This can be better evaluated with ultrasound on a nonemergent/outpatient basis if clinically indicated. Other: There is a small fat containing left lumbar hernia similar to prior CT. No associated inflammatory changes. Musculoskeletal: Degenerative changes of the spine. No acute osseous pathology. IMPRESSION: 1. No acute intra-abdominal or pelvic pathology. No hydronephrosis or nephrolithiasis. 2. Small left lumbar fat containing hernia similar to prior CT. No associated inflammatory changes. 3. Moderate colonic stool burden. No bowel obstruction. 4. A 2 cm left adnexal cyst, similar to prior CT and likely a benign or indolent process. Electronically Signed   By: Anner Crete M.D.   On: 06/16/2020 20:06   DG Chest Port 1 View  Result Date:  06/16/2020 CLINICAL DATA:  69 year old female with questionable sepsis. EXAM: PORTABLE CHEST 1 VIEW COMPARISON:  Chest radiograph dated 06/06/2020 and CT dated 06/06/2020 FINDINGS: No focal consolidation, pleural effusion, or pneumothorax. Stable cardiac silhouette. Atherosclerotic calcification of the aortic arch. No acute osseous pathology. IMPRESSION: No active disease. Electronically Signed   By: Anner Crete M.D.   On: 06/16/2020 19:39   VAS [place] LOWER EXTREMITY VENOUS (DVT)  Result Date: 06/19/2020  Lower Venous DVT Study Indications: Pain.  Risk Factors: None identified. Limitations: Body habitus and poor ultrasound/tissue interface. Comparison Study: No prior studies. Performing Technologist: Oliver Hum RVT  Examination Guidelines: A complete evaluation includes B-mode imaging, spectral Doppler, color Doppler, and power Doppler as needed of all accessible portions of each vessel. Bilateral testing is considered an integral part of a complete examination. Limited examinations for reoccurring indications may be performed as noted. The reflux portion of the exam is performed with the patient in reverse Trendelenburg.  +-----+---------------+---------+-----------+----------+--------------+ RIGHTCompressibilityPhasicitySpontaneityPropertiesThrombus Aging +-----+---------------+---------+-----------+----------+--------------+ CFV  Full           Yes      Yes                                 +-----+---------------+---------+-----------+----------+--------------+   +---------+---------------+---------+-----------+----------+--------------+ LEFT     CompressibilityPhasicitySpontaneityPropertiesThrombus Aging +---------+---------------+---------+-----------+----------+--------------+ CFV      Full           Yes      Yes                                 +---------+---------------+---------+-----------+----------+--------------+ SFJ      Full                                                         +---------+---------------+---------+-----------+----------+--------------+ FV Prox  Full                                                        +---------+---------------+---------+-----------+----------+--------------+  FV Mid   Full                                                        +---------+---------------+---------+-----------+----------+--------------+ FV Distal               Yes      Yes                                 +---------+---------------+---------+-----------+----------+--------------+ PFV      Full                                                        +---------+---------------+---------+-----------+----------+--------------+ POP      Full           Yes      Yes                                 +---------+---------------+---------+-----------+----------+--------------+ PTV      Full                                                        +---------+---------------+---------+-----------+----------+--------------+ PERO     Full                                                        +---------+---------------+---------+-----------+----------+--------------+     Summary: RIGHT: - No evidence of common femoral vein obstruction.  LEFT: - There is no evidence of deep vein thrombosis in the lower extremity. However, portions of this examination were limited- see technologist comments above.  - No cystic structure found in the popliteal fossa.  *See table(s) above for measurements and observations. Electronically signed by Monica Martinez MD on 06/19/2020 at 3:07:36 PM.    Final    ECHOCARDIOGRAM LIMITED  Result Date: 06/18/2020    ECHOCARDIOGRAM LIMITED REPORT   Patient Name:   Kelli Rivera Date of Exam: 06/18/2020 Medical Rec #:  [id number]      Height:       63.0 in Accession #:    [id number]     Weight:       263.0 lb Date of Birth:  [birth year]       BSA:          2.172 m Patient Age:    54 years       BP:           127/48 mmHg  Patient Gender: F              HR:           78 bpm. Exam Location:  Inpatient Procedure: Limited Echo Indications:    bacteremia  History:  Patient has prior history of Echocardiogram examinations, most                 recent 01/23/2020. COPD; Risk Factors:Diabetes, Dyslipidemia and                 Former Smoker.  Sonographer:    Celene Skeen RDCS (AE) Referring Phys: [id number] ROBERT W COMER  Sonographer Comments: Technically difficult study due to poor echo windows and Technically challenging study due to limited acoustic windows. IMPRESSIONS  1. Left ventricular ejection fraction, by estimation, is 40 to 45%. The left ventricle has mildly decreased function. The left ventricle demonstrates global hypokinesis.  2. Right ventricular systolic function is normal. The right ventricular size is normal.  3. The mitral valve is grossly normal. No evidence of mitral valve regurgitation. No evidence of mitral stenosis.  4. The aortic valve is tricuspid. Aortic valve regurgitation is not visualized. No aortic stenosis is present.  5. The inferior vena cava is dilated in size with <50% respiratory variability, suggesting right atrial pressure of 15 mmHg. Comparison(s): No significant change from prior study. Conclusion(s)/Recommendation(s): No evidence of valvular vegetations on this transthoracic echocardiogram. Would recommend a transesophageal echocardiogram to exclude infective endocarditis if clinically indicated. FINDINGS  Left Ventricle: Left ventricular ejection fraction, by estimation, is 40 to 45%. The left ventricle has mildly decreased function. The left ventricle demonstrates global hypokinesis. The left ventricular internal cavity size was normal in size. There is  no left ventricular hypertrophy. Right Ventricle: The right ventricular size is normal. No increase in right ventricular wall thickness. Right ventricular systolic function is normal. Left Atrium: Left atrial size was normal in size. Right Atrium:  Right atrial size was normal in size. Pericardium: Trivial pericardial effusion is present. Mitral Valve: The mitral valve is grossly normal. No evidence of mitral valve stenosis. Tricuspid Valve: The tricuspid valve is grossly normal. Tricuspid valve regurgitation is not demonstrated. No evidence of tricuspid stenosis. Aortic Valve: The aortic valve is tricuspid. Aortic valve regurgitation is not visualized. No aortic stenosis is present. Pulmonic Valve: The pulmonic valve was grossly normal. Pulmonic valve regurgitation is not visualized. No evidence of pulmonic stenosis. Venous: The inferior vena cava is dilated in size with less than 50% respiratory variability, suggesting right atrial pressure of 15 mmHg. IAS/Shunts: The atrial septum is grossly normal. Lennie Odor MD Electronically signed by Lennie Odor MD Signature Date/Time: 06/18/2020/3:16:59 PM    Final    DG Knee 3 Views Left  Result Date: 06/18/2020 CLINICAL DATA:  Pain EXAM: LEFT KNEE - 3 VIEW COMPARISON:  None. FINDINGS: Frontal, tunnel, and lateral views obtained. There is diffuse soft tissue swelling. No fracture or dislocation. There is a small joint effusion. There is generalized joint space narrowing with spurring in all compartments. No erosive changes. IMPRESSION: Generalized osteoarthritic change. Small joint effusion. Soft tissue swelling evident. No fracture or dislocation. No erosion. Electronically Signed   By: Bretta Bang III M.D.   On: 06/18/2020 10:12    Lab Data:  CBC: Recent Labs  Lab 06/16/20 2012 06/16/20 2026 06/17/20 0030 06/17/20 0740 06/18/20 0253 06/19/20 0821 06/20/20 0325  WBC 16.5*   < > 13.6* 13.1* 11.9* 9.1 9.7  NEUTROABS 14.8*  --  12.4*  --  8.0*  --   --   HGB 10.9*   < > 9.8* 10.4* 9.8* 9.8* 10.0*  HCT 36.2   < > 32.1* 35.0* 33.4* 33.0* 33.4*  MCV 81.7   < > 81.9 83.5 83.7 81.5  81.5  PLT 192   < > 152 146* 162 156 210   < > = values in this interval not displayed.   Basic Metabolic  Panel: Recent Labs  Lab 06/16/20 2012 06/16/20 2012 06/16/20 2026 06/16/20 2026 06/17/20 0030 06/17/20 2703 06/18/20 0253 06/19/20 0821 06/20/20 0325  NA 139   < > 142  --  135  --  137 138 141  K 4.4   < > 5.0  --  4.0  --  4.3 3.5 3.9  CL 109   < > 106  --  106  --  106 102 102  CO2 25  --   --   --  23  --  $R'25 28 31  'fx$ GLUCOSE 124*   < > 120*  --  154*  --  97 118* 141*  BUN 18   < > 21  --  16  --  $R'15 14 13  'ac$ CREATININE 0.75   < > 0.70   < > 0.62 0.77 0.82 0.65 0.73  CALCIUM 8.3*  --   --   --  7.8*  --  8.2* 8.2* 8.4*   < > = values in this interval not displayed.   GFR: Estimated Creatinine Clearance: 81.8 mL/min (by C-G formula based on SCr of 0.73 mg/dL). Liver Function Tests: Recent Labs  Lab 06/16/20 2012 06/17/20 0030  AST 38 58*  ALT 24 36  ALKPHOS 69 66  BILITOT 0.4 0.7  PROT 6.2* 5.3*  ALBUMIN 3.0* 2.6*   No results for input(s): LIPASE, AMYLASE in the last 168 hours. No results for input(s): AMMONIA in the last 168 hours. Coagulation Profile: Recent Labs  Lab 06/16/20 2012  INR 1.1   Cardiac Enzymes: No results for input(s): CKTOTAL, CKMB, CKMBINDEX, TROPONINI in the last 168 hours. BNP (last 3 results) No results for input(s): PROBNP in the last 8760 hours. HbA1C: No results for input(s): HGBA1C in the last 72 hours. CBG: Recent Labs  Lab 06/19/20 1208 06/19/20 1655 06/19/20 2107 06/20/20 0749 06/20/20 1152  GLUCAP 131* 164* 152* 151* 155*   Lipid Profile: No results for input(s): CHOL, HDL, LDLCALC, TRIG, CHOLHDL, LDLDIRECT in the last 72 hours. Thyroid Function Tests: No results for input(s): TSH, T4TOTAL, FREET4, T3FREE, THYROIDAB in the last 72 hours. Anemia Panel: No results for input(s): VITAMINB12, FOLATE, FERRITIN, TIBC, IRON, RETICCTPCT in the last 72 hours. Urine analysis:    Component Value Date/Time   COLORURINE YELLOW 06/16/2020 2120   APPEARANCEUR CLEAR 06/16/2020 2120   LABSPEC >1.046 (H) 06/16/2020 2120   PHURINE 5.0  06/16/2020 2120   GLUCOSEU NEGATIVE 06/16/2020 2120   HGBUR NEGATIVE 06/16/2020 2120   BILIRUBINUR NEGATIVE 06/16/2020 2120   KETONESUR NEGATIVE 06/16/2020 2120   PROTEINUR NEGATIVE 06/16/2020 2120   NITRITE NEGATIVE 06/16/2020 2120   LEUKOCYTESUR NEGATIVE 06/16/2020 2120     Polk Minor M.D. [facility] 06/20/2020, 3:05 PM   Call night coverage person covering after 7pm

## 2020-06-21 LAB — BASIC METABOLIC PANEL
Anion gap: 12 (ref 5–15)
BUN: 18 mg/dL (ref 8–23)
CO2: 30 mmol/L (ref 22–32)
Calcium: 8.6 mg/dL — ABNORMAL LOW (ref 8.9–10.3)
Chloride: 100 mmol/L (ref 98–111)
Creatinine, Ser: 0.58 mg/dL (ref 0.44–1.00)
GFR calc Af Amer: >60 mL/min (ref >60)
GFR calc non Af Amer: >60 mL/min (ref >60)
Glucose, Bld: 118 mg/dL — ABNORMAL HIGH (ref 70–99)
Potassium: 3.9 mmol/L (ref 3.5–5.1)
Sodium: 142 mmol/L (ref 135–145)

## 2020-06-21 LAB — CBC
HCT: 32.9 % — ABNORMAL LOW (ref 36.0–46.0)
Hemoglobin: 9.9 g/dL — ABNORMAL LOW (ref 12.0–15.0)
MCH: 24.5 pg — ABNORMAL LOW (ref 26.0–34.0)
MCHC: 30.1 g/dL (ref 30.0–36.0)
MCV: 81.4 fL (ref 80.0–100.0)
Platelets: 194 10*3/uL (ref 150–400)
RBC: 4.04 MIL/uL (ref 3.87–5.11)
RDW: 16.7 % — ABNORMAL HIGH (ref 11.5–15.5)
WBC: 10.2 10*3/uL (ref 4.0–10.5)
nRBC: 0 % (ref 0.0–0.2)

## 2020-06-21 LAB — GLUCOSE, CAPILLARY
Glucose-Capillary: 125 mg/dL — ABNORMAL HIGH (ref 70–99)
Glucose-Capillary: 174 mg/dL — ABNORMAL HIGH (ref 70–99)
Glucose-Capillary: 97 mg/dL (ref 70–99)
Glucose-Capillary: 152 mg/dL — ABNORMAL HIGH (ref 70–99)

## 2020-06-21 MED ORDER — IPRATROPIUM-ALBUTEROL 0.5-2.5 (3) MG/3ML IN SOLN
3.0000 mL | Freq: Three times a day (TID) | RESPIRATORY_TRACT | Status: DC
  Administered 2020-06-21 – 2020-06-22 (×3): 3 mL via RESPIRATORY_TRACT
  Filled 2020-06-21 (×3): qty 3

## 2020-06-21 MED ORDER — ALBUTEROL SULFATE (2.5 MG/3ML) 0.083% IN NEBU: 3.0000 mL | INHALATION_SOLUTION | RESPIRATORY_TRACT | Status: DC | PRN

## 2020-06-21 MED ORDER — SENNOSIDES-DOCUSATE SODIUM 8.6-50 MG PO TABS
1.0000 | ORAL_TABLET | Freq: Two times a day (BID) | ORAL | Status: DC
  Administered 2020-06-21 – 2020-06-22 (×3): 1 via ORAL
  Filled 2020-06-21 (×3): qty 1

## 2020-06-21 MED ORDER — GUAIFENESIN-DM 100-10 MG/5ML PO SYRP
5.0000 mL | ORAL_SOLUTION | ORAL | Status: DC | PRN
  Administered 2020-06-21: 5 mL via ORAL
  Filled 2020-06-21: qty 10

## 2020-06-21 MED ORDER — IPRATROPIUM-ALBUTEROL 0.5-2.5 (3) MG/3ML IN SOLN
3.0000 mL | Freq: Four times a day (QID) | RESPIRATORY_TRACT | Status: DC
  Administered 2020-06-21: 3 mL via RESPIRATORY_TRACT
  Filled 2020-06-21: qty 3

## 2020-06-21 MED ORDER — IPRATROPIUM-ALBUTEROL 0.5-2.5 (3) MG/3ML IN SOLN: 3.0000 mL | RESPIRATORY_TRACT | Status: DC | PRN

## 2020-06-21 MED ORDER — ALBUTEROL SULFATE (2.5 MG/3ML) 0.083% IN NEBU: 3.0000 mL | INHALATION_SOLUTION | Freq: Four times a day (QID) | RESPIRATORY_TRACT | Status: DC

## 2020-06-21 MED ORDER — BENZONATATE 100 MG PO CAPS
100.0000 mg | ORAL_CAPSULE | Freq: Three times a day (TID) | ORAL | Status: DC
  Administered 2020-06-21 – 2020-06-22 (×4): 100 mg via ORAL
  Filled 2020-06-21 (×4): qty 1

## 2020-06-21 MED ORDER — POLYETHYLENE GLYCOL 3350 17 G PO PACK
17.0000 g | PACK | Freq: Every day | ORAL | Status: DC
  Administered 2020-06-21 – 2020-06-22 (×2): 17 g via ORAL
  Filled 2020-06-21 (×2): qty 1

## 2020-06-21 NOTE — Progress Notes (Signed)
[facility]                                                                              Patient Demographics  Kelli Rivera, is a 69 y.o. female, DOB - 1951/07/17, VZC:[id number]  Admit date - 06/16/2020   Admitting Physician Karie Kirks, DO  Outpatient Primary MD for the patient is [name], MD  Outpatient specialists:   LOS - 5  days   Medical records reviewed and are as summarized below:    Chief Complaint  Patient presents with  . Shortness of Breath  . Fever       Brief summary   Kelli Rivera a 69 y.o.femalewithhistory of diabetes mellitus type 2, systolic dysfunction, COPD, rheumatoid arthritis presents to the ER because of fever and chills with low back pain. Patient started having fever chills this morning. No associated shortness of breath chest pain nausea vomiting diarrhea or dysuria. Patient has been having low back pain for the last 3 weeks and has been taking over-the-counter medications. Denies any incontinence of bowel or urine. Has pain on moving her left lower extremity and movement is decreased because of the pain.  ED Course:In the ER patient was febrile with temperature 103 F tachycardic with leukocytosis lactic acid was normal. Blood cultures were sent. Chest x-ray UA unremarkable Covid test was negative. CT abdomen pelvis done shows left lumbar fat-containing hernia otherwise nothing acute. Patient started on empiric antibiotics for possible sepsis. Blood culture x 2 positive for strep agalactiae.   Assessment & Plan    Principal Problem: Sepsis with strep agalactiae bacteremia - Patient presented with fever 102.1 F, hypotension, tachypnea, leukocytosis 16.5, blood cultures positive - Patient also complaining of back pain, acute on chronic, MRI lumbar spine negative for acute infection or fluid collection. - 2D echo showed no vegetations. - Left knee x-ray showed no severe effusion, venous Dopplers negative  for DVT - Procalcitonin improving 11.75 (<-21.7), CRP 10.1, ESR mildly elevated at 36 - Repeat blood cultures negative so far - Venous doppler LE negative for DVT -Currently no complaints, will continue IV penicillin today, DC home in a.m. on oral amoxicillin 500 mg 3 times daily for 7 days per ID recommendation.   Active Problems: Acute on chronic back pain -She has underlying history of rheumatoid arthritis. -Discussed with ID, Dr Linus Salmons, repeating MRI of the spine is not needed as no worsening in the clinical status, no leukocytosis or fevers, no FND's, will likely benefit repeating MRI in a week after completing antibiotics. -Appreciate orthopedics recommendations, patient will benefit from [facility] to L4-L5 region however currently contraindicated in the light of bacteremia -Back pain improving, on current regimen.  Continue scheduled Tylenol, Toradol, Lidoderm patch, Robaxin, Norco as needed  E. coli UTI -Urine culture showed E. coli, sensitive to ampicillin and cephalosporins -Patient has received 3 days of IV Rocephin    Diabetes mellitus type 2 in obese (HCC) -Continue Lantus, sliding scale insulin CBG stable    RA (rheumatoid arthritis) ([place]) -Patient takes Enbrel injections, outpatient follow-up with rheumatology outpatient  Anemia of chronic disease -Likely related to rheumatoid arthritis, hemoglobin stable  COPD -  Currently stable, no active wheezing   Chronic systolic CHF -2D echo 01/23/2020 showed EF of 40 to 45% -Lasix was held on admission due to low BP, monitor I's and O's, net +4.5 L, restart oral Lasix 40 mg daily -Creatinine stable, 0.6, still net 3.1 L positive, improving   Obesity Estimated body mass index is 46.59 kg/m as calculated from the following:   Height as of this encounter: 5' 3" (1.6 m).   Weight as of this encounter: 119.3 kg.  Code Status: Full code DVT Prophylaxis:  Lovenox Family Communication: Discussed all imaging results, lab results,  explained to the patient's granddaughter at the bedside and daughter on the phone.    Disposition Plan:     Status is: Inpatient  Remains inpatient appropriate because:Inpatient level of care appropriate due to severity of illness   Dispo: The patient is from: Home              Anticipated d/c is to: Home              Anticipated d/c date is: 1 day              Patient currently is not medically stable to d/c.  Back pain improving, but not currently at baseline.  Discussed in detail with the patient's granddaughter at the bedside and daughter on the phone, requested DC home in a.m.    Time Spent in minutes 35 minutes  Procedures:  MRI lumbar spine  Consultants:   Infectious disease  Antimicrobials:   Anti-infectives (From admission, onward)   Start     Dose/Rate Route Frequency Ordered Stop   06/19/20 0900  penicillin G potassium 12 Million Units in dextrose 5 % 500 mL continuous infusion        12 Million Units 41.7 mL/hr over 12 Hours Intravenous Every 12 hours 06/18/20 1428     06/17/20 1000  vancomycin (VANCOCIN) IVPB 1000 mg/200 mL premix  Status:  Discontinued        1,000 mg 200 mL/hr over 60 Minutes Intravenous Every 12 hours 06/16/20 2352 06/17/20 0927   06/17/20 1000  cefTRIAXone (ROCEPHIN) 2 g in sodium chloride 0.9 % 100 mL IVPB  Status:  Discontinued        2 g 200 mL/hr over 30 Minutes Intravenous Every 24 hours 06/17/20 0927 06/18/20 1428   06/17/20 0000  metroNIDAZOLE (FLAGYL) IVPB 500 mg  Status:  Discontinued        500 mg 100 mL/hr over 60 Minutes Intravenous Every 8 hours 06/16/20 2340 06/17/20 0927   06/17/20 0000  vancomycin (VANCOREADY) IVPB 2000 mg/400 mL        2,000 mg 200 mL/hr over 120 Minutes Intravenous  Once 06/16/20 2352 06/17/20 0325   06/17/20 0000  ceFEPIme (MAXIPIME) 2 g in sodium chloride 0.9 % 100 mL IVPB  Status:  Discontinued        2 g 200 mL/hr over 30 Minutes Intravenous Every 8 hours 06/16/20 2352 06/17/20 0927   06/16/20 2345   ceFEPIme (MAXIPIME) 2 g in sodium chloride 0.9 % 100 mL IVPB  Status:  Discontinued        2 g 200 mL/hr over 30 Minutes Intravenous  Once 06/16/20 2340 06/16/20 2347   06/16/20 2345  vancomycin (VANCOCIN) IVPB 1000 mg/200 mL premix  Status:  Discontinued        1,000 mg 200 mL/hr over 60 Minutes Intravenous  Once 06/16/20 2340 06/16/20 2347   06/16/20 1930  cefTRIAXone (ROCEPHIN)   1 g in sodium chloride 0.9 % 100 mL IVPB  Status:  Discontinued        1 g 200 mL/hr over 30 Minutes Intravenous Every 24 hours 06/16/20 1925 06/16/20 2340         Medications  Scheduled Meds: . acetaminophen  1,000 mg Oral Q8H  . aspirin EC  81 mg Oral Daily  . atorvastatin  80 mg Oral q1800  . benzonatate  100 mg Oral TID  . enoxaparin (LOVENOX) injection  40 mg Subcutaneous Q24H  . furosemide  40 mg Oral Daily  . insulin aspart  0-9 Units Subcutaneous TID WC  . insulin glargine  20 Units Subcutaneous QHS  . ipratropium-albuterol  3 mL Nebulization TID  . ketorolac  15 mg Intravenous Q6H  . lidocaine  2 patch Transdermal Q24H  . mometasone-formoterol  2 puff Inhalation BID  . montelukast  10 mg Oral BH-q7a  . oxybutynin  15 mg Oral Daily  . pantoprazole  20 mg Oral BID  . polyethylene glycol  17 g Oral Daily  . senna-docusate  1 tablet Oral BID   Continuous Infusions: . methocarbamol (ROBAXIN) IV 500 mg (06/21/20 1225)  . penicillin g continuous IV infusion 41.7 mL/hr at 06/21/20 0858   PRN Meds:.acetaminophen, albuterol, guaiFENesin-dextromethorphan, HYDROcodone-acetaminophen, ibuprofen, LORazepam, ondansetron **OR** ondansetron (ZOFRAN) IV      Subjective:   Chayanne Claiborne was seen and examined today.  Back pain improving today, 5/10.  Left knee pain is improving.  No neurological deficits.   Patient denies dizziness, chest pain, shortness of breath, abdominal pain, N/V/D/C.     Objective:   Vitals:   06/21/20 0520 06/21/20 0831 06/21/20 0840 06/21/20 1426  BP: (!) 119/59   (!) 116/49   Pulse: (!) 57   (!) 50  Resp: 16   16  Temp: 97.8 F (36.6 C)   97.9 F (36.6 C)  TempSrc:    Oral  SpO2: 94% 96% 96% 100%  Weight:      Height:        Intake/Output Summary (Last 24 hours) at 06/21/2020 1439 Last data filed at 06/21/2020 1144 Gross per 24 hour  Intake 433.54 ml  Output 1000 ml  Net -566.46 ml     Wt Readings from Last 3 Encounters:  06/16/20 119.3 kg  06/06/20 120.2 kg  02/26/20 122 kg    Physical Exam  General: Alert and oriented x 3, NAD  Cardiovascular: S1 S2 clear, RRR.  Trace  pedal edema b/l  Respiratory: CTAB, no wheezing, rales or rhonchi  Gastrointestinal: Soft, nontender, nondistended, NBS  Ext: trace pedal edema bilaterally  Neuro: no new deficits  Musculoskeletal: No cyanosis, clubbing  Skin: No rashes  Psych: Normal affect and demeanor, alert and oriented x3       Data Reviewed:  I have personally reviewed following labs and imaging studies  Micro Results Recent Results (from the past 240 hour(s))  Blood Culture (routine x 2)     Status: Abnormal   Collection Time: 06/16/20  8:00 PM   Specimen: BLOOD  Result Value Ref Range Status   Specimen Description   Final    BLOOD LEFT ANTECUBITAL Performed at [facility], [address] [place], [place] [address]    Special Requests   Final    BOTTLES DRAWN AEROBIC AND ANAEROBIC Blood Culture results may not be optimal due to an excessive volume of blood received in culture bottles Performed at [facility], [address] [place], [place] [address]    Culture  Setup Time   Final    GRAM POSITIVE COCCI IN CHAINS IN BOTH AEROBIC AND ANAEROBIC BOTTLES CRITICAL RESULT CALLED TO, READ BACK BY AND VERIFIED WITH: PHARMD E. JACKSON [id number] FCP Performed at [facility], [address] [place], [place] [address]    Culture GROUP B STREP(S.AGALACTIAE)ISOLATED (A)  Final   Report Status 06/19/2020 FINAL  Final   Organism ID,  Bacteria GROUP B STREP(S.AGALACTIAE)ISOLATED  Final      Susceptibility   Group b strep(s.agalactiae)isolated - MIC*    CLINDAMYCIN <=0.25 SENSITIVE Sensitive     AMPICILLIN <=0.25 SENSITIVE Sensitive     ERYTHROMYCIN 2 RESISTANT Resistant     VANCOMYCIN 0.5 SENSITIVE Sensitive     CEFTRIAXONE <=0.12 SENSITIVE Sensitive     LEVOFLOXACIN 1 SENSITIVE Sensitive     * GROUP B STREP(S.AGALACTIAE)ISOLATED  Blood Culture ID Panel (Reflexed)     Status: Abnormal   Collection Time: 06/16/20  8:00 PM  Result Value Ref Range Status   Enterococcus faecalis NOT DETECTED NOT DETECTED Final   Enterococcus Faecium NOT DETECTED NOT DETECTED Final   Listeria monocytogenes NOT DETECTED NOT DETECTED Final   Staphylococcus species NOT DETECTED NOT DETECTED Final   Staphylococcus aureus (BCID) NOT DETECTED NOT DETECTED Final   Staphylococcus epidermidis NOT DETECTED NOT DETECTED Final   Staphylococcus lugdunensis NOT DETECTED NOT DETECTED Final   Streptococcus species DETECTED (A) NOT DETECTED Final    Comment: CRITICAL RESULT CALLED TO, READ BACK BY AND VERIFIED WITH: PHARMD E. JACKSON [id number] FCP    Streptococcus agalactiae DETECTED (A) NOT DETECTED Final    Comment: CRITICAL RESULT CALLED TO, READ BACK BY AND VERIFIED WITH: PHARMD E. JACKSON [id number] FCP    Streptococcus pneumoniae NOT DETECTED NOT DETECTED Final   Streptococcus pyogenes NOT DETECTED NOT DETECTED Final   A.calcoaceticus-baumannii NOT DETECTED NOT DETECTED Final   Bacteroides fragilis NOT DETECTED NOT DETECTED Final   Enterobacterales NOT DETECTED NOT DETECTED Final   Enterobacter cloacae complex NOT DETECTED NOT DETECTED Final   Escherichia coli NOT DETECTED NOT DETECTED Final   Klebsiella aerogenes NOT DETECTED NOT DETECTED Final   Klebsiella oxytoca NOT DETECTED NOT DETECTED Final   Klebsiella pneumoniae NOT DETECTED NOT DETECTED Final   Proteus species NOT DETECTED NOT DETECTED Final   Salmonella species NOT DETECTED  NOT DETECTED Final   Serratia marcescens NOT DETECTED NOT DETECTED Final   Haemophilus influenzae NOT DETECTED NOT DETECTED Final   Neisseria meningitidis NOT DETECTED NOT DETECTED Final   Pseudomonas aeruginosa NOT DETECTED NOT DETECTED Final   Stenotrophomonas maltophilia NOT DETECTED NOT DETECTED Final   Candida albicans NOT DETECTED NOT DETECTED Final   Candida auris NOT DETECTED NOT DETECTED Final   Candida glabrata NOT DETECTED NOT DETECTED Final   Candida krusei NOT DETECTED NOT DETECTED Final   Candida parapsilosis NOT DETECTED NOT DETECTED Final   Candida tropicalis NOT DETECTED NOT DETECTED Final   Cryptococcus neoformans/gattii NOT DETECTED NOT DETECTED Final    Comment: Performed at [facility], [address] [place], [place] [address]  Blood Culture (routine x 2)     Status: Abnormal   Collection Time: 06/16/20  8:10 PM   Specimen: BLOOD RIGHT FOREARM  Result Value Ref Range Status   Specimen Description   Final    BLOOD RIGHT FOREARM Performed at [facility], [address] [place], [place] [address]    Special Requests     Final    BOTTLES DRAWN AEROBIC AND ANAEROBIC Blood Culture adequate volume Performed at [facility], [address] [place], [place] [address]    Culture  Setup Time   Final    GRAM POSITIVE COCCI IN CHAINS IN BOTH AEROBIC AND ANAEROBIC BOTTLES    Culture (A)  Final    GROUP B STREP(S.AGALACTIAE)ISOLATED SUSCEPTIBILITIES PERFORMED ON PREVIOUS CULTURE WITHIN THE LAST 5 DAYS. Performed at [facility], [address] [place], [place] [address]    Report Status 06/19/2020 FINAL  Final  SARS Coronavirus 2 by RT PCR (hospital order, performed in [place] hospital lab) Nasopharyngeal Nasopharyngeal Swab     Status: None   Collection Time: 06/16/20  8:18 PM   Specimen: Nasopharyngeal Swab  Result Value Ref Range Status   SARS Coronavirus 2 NEGATIVE NEGATIVE Final    Comment:  (NOTE) SARS-CoV-2 target nucleic acids are NOT DETECTED.  The SARS-CoV-2 RNA is generally detectable in upper and lower respiratory specimens during the acute phase of infection. The lowest concentration of SARS-CoV-2 viral copies this assay can detect is 250 copies / mL. A negative result does not preclude SARS-CoV-2 infection and should not be used as the sole basis for treatment or other patient management decisions.  A negative result may occur with improper specimen collection / handling, submission of specimen other than nasopharyngeal swab, presence of viral mutation(s) within the areas targeted by this assay, and inadequate number of viral copies (<250 copies / mL). A negative result must be combined with clinical observations, patient history, and epidemiological information.  Fact Sheet for Patients:   https://www.fda.gov/media/136312/download  Fact Sheet for Healthcare Providers: https://www.fda.gov/media/136313/download  This test is not yet approved or  cleared by the [place] FDA and has been authorized for detection and/or diagnosis of SARS-CoV-2 by FDA under an Emergency Use Authorization (EUA).  This EUA will remain in effect (meaning this test can be used) for the duration of the COVID-19 declaration under Section 564(b)(1) of the Act, 21 U.S.C. section 360bbb-3(b)(1), unless the authorization is terminated or revoked sooner.  Performed at [facility], [address] [place], [place] [address]   Urine culture     Status: Abnormal   Collection Time: 06/16/20  9:30 PM   Specimen: In/Out Cath Urine  Result Value Ref Range Status   Specimen Description   Final    IN/OUT CATH URINE Performed at [facility], [address] [place], [place] [address]    Special Requests   Final    NONE Performed at [facility], [address] [place], [place] [address]    Culture >=100,000 COLONIES/mL  ESCHERICHIA COLI (A)  Final   Report Status 06/18/2020 FINAL  Final   Organism ID, Bacteria ESCHERICHIA COLI (A)  Final      Susceptibility   Escherichia coli - MIC*    AMPICILLIN <=2 SENSITIVE Sensitive     CEFAZOLIN <=4 SENSITIVE Sensitive     CEFTRIAXONE <=0.25 SENSITIVE Sensitive     CIPROFLOXACIN <=0.25 SENSITIVE Sensitive     GENTAMICIN <=1 SENSITIVE Sensitive     IMIPENEM <=0.25 SENSITIVE Sensitive     NITROFURANTOIN <=16 SENSITIVE Sensitive     TRIMETH/SULFA <=20 SENSITIVE Sensitive     AMPICILLIN/SULBACTAM <=2 SENSITIVE Sensitive     PIP/TAZO <=4 SENSITIVE Sensitive     * >=100,000 COLONIES/mL ESCHERICHIA COLI  Culture, blood (Routine X 2) w Reflex to ID Panel     Status: None (  Preliminary result)   Collection Time: 06/18/20  9:34 AM   Specimen: BLOOD  Result Value Ref Range Status   Specimen Description   Final    BLOOD RIGHT ANTECUBITAL Performed at [facility] [address], [place], [place] [address]    Special Requests   Final    BOTTLES DRAWN AEROBIC AND ANAEROBIC Blood Culture adequate volume Performed at [facility] [address], [place], [place] [address]    Culture   Final    NO GROWTH 3 DAYS Performed at [facility], [place] [address], [place], [place] [address]    Report Status PENDING  Incomplete  Culture, blood (Routine X 2) w Reflex to ID Panel     Status: None (Preliminary result)   Collection Time: 06/18/20  9:36 AM   Specimen: BLOOD LEFT HAND  Result Value Ref Range Status   Specimen Description   Final    BLOOD LEFT HAND Performed at [facility] [address], [place], [place] [address]    Special Requests   Final    BOTTLES DRAWN AEROBIC AND ANAEROBIC Blood Culture adequate volume Performed at [facility] [address] [place], [place] [address]    Culture   Final    NO GROWTH 3 DAYS Performed at [facility], [place] [address], [place],  [place] [address]    Report Status PENDING  Incomplete    Radiology Reports DG Chest 2 View  Result Date: 06/06/2020 CLINICAL DATA:  Indigestion. Elevated D-dimer. No reported shortness of breath. EXAM: CHEST - 2 VIEW COMPARISON:  01/22/2020 FINDINGS: Cardiac silhouette is normal in size. No mediastinal or hilar masses. No evidence of adenopathy. Clear lungs.  No pleural effusion or pneumothorax. Skeletal structures are intact. IMPRESSION: No active cardiopulmonary disease. Electronically Signed   By: Lajean Manes M.D.   On: 06/06/2020 13:32   CT Angio Chest PE W and/or Wo Contrast  Result Date: 06/06/2020 CLINICAL DATA:  Pt with elevated d-dimer sent to ED from PMD's office; pt states she has chronic SOB and leg swelling; diabetic; no h/o PE or blood clots EXAM: CT ANGIOGRAPHY CHEST WITH CONTRAST TECHNIQUE: Multidetector CT imaging of the chest was performed using the standard protocol during bolus administration of intravenous contrast. Multiplanar CT image reconstructions and MIPs were obtained to evaluate the vascular anatomy. CONTRAST:  165m OMNIPAQUE IOHEXOL 350 MG/ML SOLN COMPARISON:  Current chest radiograph. FINDINGS: Cardiovascular: There is satisfactory opacification of the pulmonary arteries to the segmental level. There is no evidence of a pulmonary embolism. Heart is top-normal in size. No pericardial effusion. No coronary artery calcifications. Aorta is normal in caliber. No dissection mild atherosclerosis along the arch. Branch vessels are widely patent. Mediastinum/Nodes: Thyroid nodules, with evidence of a nodule enlarging the left lobe, at least 2 cm in size. No neck base axillary, mediastinal or hilar masses or enlarged lymph nodes. Trachea and esophagus are unremarkable. Lungs/Pleura: No pneumonia or pulmonary edema. No mass or suspicious nodule. Peripheral interstitial thickening mild changes of centrilobular emphysema. No pleural effusion or pneumothorax. Upper Abdomen: No acute  abnormality. Musculoskeletal: No fracture or acute finding.  No bone lesion. Review of the MIP images confirms the above findings. IMPRESSION: 1. No evidence of a pulmonary embolism. 2. No acute findings. 3. Chronic appearing interstitial thickening. Mild centrilobular emphysema. 4. Mild aortic atherosclerosis. Aortic Atherosclerosis (ICD10-I70.0) and Emphysema (ICD10-J43.9). Electronically Signed   By: DLajean ManesM.D.   On: 06/06/2020 14:45  MR Lumbar Spine W Wo Contrast  Result Date: 06/17/2020 CLINICAL DATA:  Low back pain radiating down the left leg with numbness and weakness. EXAM: MRI LUMBAR SPINE WITHOUT AND WITH CONTRAST TECHNIQUE: Multiplanar and multiecho pulse sequences of the lumbar spine were obtained without and with intravenous contrast. CONTRAST:  10mL GADAVIST GADOBUTROL 1 MMOL/ML IV SOLN COMPARISON:  CT scan 06/16/2020 and lumbar MRI from 03/29/2017 FINDINGS: Segmentation: The lowest lumbar type non-rib-bearing vertebra is labeled as L5. Alignment:  No vertebral subluxation is observed. Vertebrae: Type 3 degenerative endplate findings at L4-5 with disc desiccation and loss of disc height at this level. Left facet and perifacet edema and enhancement at the L3-4 level attributed to degenerative arthropathy. No compelling findings of discitis or osteomyelitis. Conus medullaris and cauda equina: Conus extends to the L1-2 level. Conus and cauda equina appear normal, without abnormal enhancement or clumping of the cauda equina to suggest arachnoiditis. Paraspinal and other soft tissues: There is evidence of a chronic left lumbar hernia, with herniation of left perirenal adipose tissue into the subcutaneous tissues, although review of prior studies indicates this to be a chronic hernia, and review of recent CT reveals no significant degree of abnormal associated inflammation. Disc levels: T11-12: Borderline right foraminal stenosis due to disc bulge and right inferior foraminal disc protrusion.  This level is only included on the parasagittal images. T12-L1: No impingement.  Small central disc protrusion. L1-2: No impingement. Mild disc bulge with small central annular tear. L2-3: Unremarkable. L3-4: No impingement. Disc bulge and bilateral degenerative facet arthropathy. L4-5: Mild bilateral foraminal stenosis and mild right subarticular lateral recess stenosis due to disc bulge, intervertebral spurring, and mild facet arthropathy. There is also a conjoined appearance of the left L4 and L5 nerve roots which contributes to low position of the left L4 nerve roots in the neural foramen which is likewise factor mild left foraminal stenosis. Impingement at this level is mildly worsened compared to 03/29/2017. L5-S1: No impingement. Small left paracentral disc protrusion. The broad left transverse process of L5 appears to mildly pseudo articulate with the sacrum, without obvious edema signal to suggest Bertolotti syndrome. IMPRESSION: 1. Lumbar spondylosis and degenerative disc disease causing mild bilateral impingement at L4-5, mildly worsened from 03/29/2017. 2. Left facet and perifacet edema and low-grade enhancement at the L3-4 level is notably increased from 03/29/2017, but probably due to degenerative facet arthropathy given the morphology of the facets. There is no overt facet joint effusion and hence I am skeptical of infected left facet joint at L3-4 as a cause for the appearance. There is no drainable collection in this vicinity. 3. The broad left transverse process of L5 appears to mildly pseudo articulate with the sacrum, but there is no obvious edema signal to suggest Bertolotti syndrome. Electronically Signed   By: Walter  Liebkemann M.D.   On: 06/17/2020 07:20   CT ABDOMEN PELVIS W CONTRAST  Result Date: 06/16/2020 CLINICAL DATA:  69-year-old female with left flank pain. Concern for kidney stone. EXAM: CT ABDOMEN AND PELVIS WITH CONTRAST TECHNIQUE: Multidetector CT imaging of the abdomen and  pelvis was performed using the standard protocol following bolus administration of intravenous contrast. CONTRAST:  100mL OMNIPAQUE IOHEXOL 300 MG/ML  SOLN COMPARISON:  CT abdomen pelvis dated 01/09/2018. FINDINGS: Lower chest: There are minimal bibasilar atelectasis. No intra-abdominal free air or free fluid. Hepatobiliary: The liver is unremarkable. No intrahepatic biliary dilatation. Cholecystectomy. Pancreas: Unremarkable. No pancreatic ductal dilatation or surrounding inflammatory changes. Spleen: Normal in size without focal abnormality.   Adrenals/Urinary Tract: The adrenal glands unremarkable. There is no hydronephrosis on either side. There is symmetric enhancement and excretion of contrast by both kidneys. Subcentimeter left renal inferior pole hypodense focus is too small to characterize but appears similar to prior CT. The visualized ureters and urinary bladder appear unremarkable. Stomach/Bowel: There is a small hiatal hernia. There is moderate stool throughout the colon. There is no bowel obstruction or active inflammation. There is a 3.5 cm distal duodenal diverticulum. No active inflammatory changes. Appendectomy. Vascular/Lymphatic: The abdominal aorta and IVC unremarkable. No portal venous gas. There is no adenopathy. Reproductive: Hysterectomy. There is a 2 cm left adnexal cyst, similar to prior CT and likely a benign or indolent process. This can be better evaluated with ultrasound on a nonemergent/outpatient basis if clinically indicated. Other: There is a small fat containing left lumbar hernia similar to prior CT. No associated inflammatory changes. Musculoskeletal: Degenerative changes of the spine. No acute osseous pathology. IMPRESSION: 1. No acute intra-abdominal or pelvic pathology. No hydronephrosis or nephrolithiasis. 2. Small left lumbar fat containing hernia similar to prior CT. No associated inflammatory changes. 3. Moderate colonic stool burden. No bowel obstruction. 4. A 2 cm left  adnexal cyst, similar to prior CT and likely a benign or indolent process. Electronically Signed   By: Arash  Radparvar M.D.   On: 06/16/2020 20:06   DG Chest Port 1 View  Result Date: 06/16/2020 CLINICAL DATA:  69-year-old female with questionable sepsis. EXAM: PORTABLE CHEST 1 VIEW COMPARISON:  Chest radiograph dated 06/06/2020 and CT dated 06/06/2020 FINDINGS: No focal consolidation, pleural effusion, or pneumothorax. Stable cardiac silhouette. Atherosclerotic calcification of the aortic arch. No acute osseous pathology. IMPRESSION: No active disease. Electronically Signed   By: Arash  Radparvar M.D.   On: 06/16/2020 19:39   VAS US LOWER EXTREMITY VENOUS (DVT)  Result Date: 06/19/2020  Lower Venous DVT Study Indications: Pain.  Risk Factors: None identified. Limitations: Body habitus and poor ultrasound/tissue interface. Comparison Study: No prior studies. Performing Technologist: Gregory Collins RVT  Examination Guidelines: A complete evaluation includes B-mode imaging, spectral Doppler, color Doppler, and power Doppler as needed of all accessible portions of each vessel. Bilateral testing is considered an integral part of a complete examination. Limited examinations for reoccurring indications may be performed as noted. The reflux portion of the exam is performed with the patient in reverse Trendelenburg.  +-----+---------------+---------+-----------+----------+--------------+ RIGHTCompressibilityPhasicitySpontaneityPropertiesThrombus Aging +-----+---------------+---------+-----------+----------+--------------+ CFV  Full           Yes      Yes                                 +-----+---------------+---------+-----------+----------+--------------+   +---------+---------------+---------+-----------+----------+--------------+ LEFT     CompressibilityPhasicitySpontaneityPropertiesThrombus Aging +---------+---------------+---------+-----------+----------+--------------+ CFV      Full            Yes      Yes                                 +---------+---------------+---------+-----------+----------+--------------+ SFJ      Full                                                        +---------+---------------+---------+-----------+----------+--------------+ FV Prox  Full                                                        +---------+---------------+---------+-----------+----------+--------------+   FV Mid   Full                                                        +---------+---------------+---------+-----------+----------+--------------+ FV Distal               Yes      Yes                                 +---------+---------------+---------+-----------+----------+--------------+ PFV      Full                                                        +---------+---------------+---------+-----------+----------+--------------+ POP      Full           Yes      Yes                                 +---------+---------------+---------+-----------+----------+--------------+ PTV      Full                                                        +---------+---------------+---------+-----------+----------+--------------+ PERO     Full                                                        +---------+---------------+---------+-----------+----------+--------------+     Summary: RIGHT: - No evidence of common femoral vein obstruction.  LEFT: - There is no evidence of deep vein thrombosis in the lower extremity. However, portions of this examination were limited- see technologist comments above.  - No cystic structure found in the popliteal fossa.  *See table(s) above for measurements and observations. Electronically signed by Christopher Clark MD on 06/19/2020 at 3:07:36 PM.    Final    ECHOCARDIOGRAM LIMITED  Result Date: 06/18/2020    ECHOCARDIOGRAM LIMITED REPORT   Patient Name:   Laurielle P Klinger Date of Exam: 06/18/2020 Medical Rec #:  [id number]       Height:       63.0 in Accession #:    [id number]     Weight:       263.0 lb Date of Birth:  07/01/1951       BSA:          2.172 m Patient Age:    69 years       BP:           127/48 mmHg Patient Gender: F              HR:           78 bpm. Exam Location:  Inpatient Procedure: Limited Echo Indications:    bacteremia  History:          Patient has prior history of Echocardiogram examinations, most                 recent 01/23/2020. COPD; Risk Factors:Diabetes, Dyslipidemia and                 Former Smoker.  Sonographer:    Vijay Shankar RDCS (AE) Referring Phys: [id number] ROBERT W COMER  Sonographer Comments: Technically difficult study due to poor echo windows and Technically challenging study due to limited acoustic windows. IMPRESSIONS  1. Left ventricular ejection fraction, by estimation, is 40 to 45%. The left ventricle has mildly decreased function. The left ventricle demonstrates global hypokinesis.  2. Right ventricular systolic function is normal. The right ventricular size is normal.  3. The mitral valve is grossly normal. No evidence of mitral valve regurgitation. No evidence of mitral stenosis.  4. The aortic valve is tricuspid. Aortic valve regurgitation is not visualized. No aortic stenosis is present.  5. The inferior vena cava is dilated in size with <50% respiratory variability, suggesting right atrial pressure of 15 mmHg. Comparison(s): No significant change from prior study. Conclusion(s)/Recommendation(s): No evidence of valvular vegetations on this transthoracic echocardiogram. Would recommend a transesophageal echocardiogram to exclude infective endocarditis if clinically indicated. FINDINGS  Left Ventricle: Left ventricular ejection fraction, by estimation, is 40 to 45%. The left ventricle has mildly decreased function. The left ventricle demonstrates global hypokinesis. The left ventricular internal cavity size was normal in size. There is  no left ventricular hypertrophy. Right Ventricle: The right  ventricular size is normal. No increase in right ventricular wall thickness. Right ventricular systolic function is normal. Left Atrium: Left atrial size was normal in size. Right Atrium: Right atrial size was normal in size. Pericardium: Trivial pericardial effusion is present. Mitral Valve: The mitral valve is grossly normal. No evidence of mitral valve stenosis. Tricuspid Valve: The tricuspid valve is grossly normal. Tricuspid valve regurgitation is not demonstrated. No evidence of tricuspid stenosis. Aortic Valve: The aortic valve is tricuspid. Aortic valve regurgitation is not visualized. No aortic stenosis is present. Pulmonic Valve: The pulmonic valve was grossly normal. Pulmonic valve regurgitation is not visualized. No evidence of pulmonic stenosis. Venous: The inferior vena cava is dilated in size with less than 50% respiratory variability, suggesting right atrial pressure of 15 mmHg. IAS/Shunts: The atrial septum is grossly normal. Wilcox O'Neal MD Electronically signed by [place] O'Neal MD Signature Date/Time: 06/18/2020/3:16:59 PM    Final    DG Knee 3 Views Left  Result Date: 06/18/2020 CLINICAL DATA:  Pain EXAM: LEFT KNEE - 3 VIEW COMPARISON:  None. FINDINGS: Frontal, tunnel, and lateral views obtained. There is diffuse soft tissue swelling. No fracture or dislocation. There is a small joint effusion. There is generalized joint space narrowing with spurring in all compartments. No erosive changes. IMPRESSION: Generalized osteoarthritic change. Small joint effusion. Soft tissue swelling evident. No fracture or dislocation. No erosion. Electronically Signed   By: William  Woodruff III M.D.   On: 06/18/2020 10:12    Lab Data:  CBC: Recent Labs  Lab 06/16/20 2012 06/16/20 2026 06/17/20 0030 06/17/20 0030 06/17/20 0607 06/18/20 0253 06/19/20 0821 06/20/20 0325 06/21/20 0510  WBC 16.5*   < > 13.6*   < > 13.1* 11.9* 9.1 9.7 10.2  NEUTROABS 14.8*  --  12.4*  --   --  8.0*  --   --   --    HGB 10.9*   < > 9.8*   < > 10.4* 9.8* 9.8* 10.0* 9.9*  HCT   36.2   < > 32.1*   < > 35.0* 33.4* 33.0* 33.4* 32.9*  MCV 81.7   < > 81.9   < > 83.5 83.7 81.5 81.5 81.4  PLT 192   < > 152   < > 146* 162 156 210 194   < > = values in this interval not displayed.   Basic Metabolic Panel: Recent Labs  Lab 06/17/20 0030 06/17/20 0030 06/17/20 0607 06/18/20 0253 06/19/20 0821 06/20/20 0325 06/21/20 0510  NA 135  --   --  137 138 141 142  K 4.0  --   --  4.3 3.5 3.9 3.9  CL 106  --   --  106 102 102 100  CO2 23  --   --  _0 GLUCOSE 154*  --   --  97 118* 141* 118*  BUN 16  --   --  _1 CREATININE 0.62   < > 0.77 0.82 0.65 0.73 0.58  CALCIUM 7.8*  --   --  8.2* 8.2* 8.4* 8.6*   < > = values in this interval not displayed.   GFR: Estimated Creatinine Clearance: 81.8 mL/min (by C-G formula based on SCr of 0.58 mg/dL). Liver Function Tests: Recent Labs  Lab 06/16/20 2012 06/17/20 0030  AST 38 58*  ALT 24 36  ALKPHOS 69 66  BILITOT 0.4 0.7  PROT 6.2* 5.3*  ALBUMIN 3.0* 2.6*   No results for input(s): LIPASE, AMYLASE in the last 168 hours. No results for input(s): AMMONIA in the last 168 hours. Coagulation Profile: Recent Labs  Lab 06/16/20 2012  INR 1.1   Cardiac Enzymes: No results for input(s): CKTOTAL, CKMB, CKMBINDEX, TROPONINI in the last 168 hours. BNP (last 3 results) No results for input(s): PROBNP in the last 8760 hours. HbA1C: No results for input(s): HGBA1C in the last 72 hours. CBG: Recent Labs  Lab 06/20/20 1152 06/20/20 1641 06/20/20 2033 06/21/20 0742 06/21/20 1147  GLUCAP 155* 144* 161* 125* 174*   Lipid Profile: No results for input(s): CHOL, HDL, LDLCALC, TRIG, CHOLHDL, LDLDIRECT in the last 72 hours. Thyroid Function Tests: No results for input(s): TSH, T4TOTAL, FREET4, T3FREE, THYROIDAB in the last 72 hours. Anemia Panel: No results for input(s): VITAMINB12, FOLATE, FERRITIN, TIBC, IRON, RETICCTPCT in the last 72  hours. Urine analysis:    Component Value Date/Time   COLORURINE YELLOW 06/16/2020 2120   APPEARANCEUR CLEAR 06/16/2020 2120   LABSPEC >1.046 (H) 06/16/2020 2120   PHURINE 5.0 06/16/2020 2120   GLUCOSEU NEGATIVE 06/16/2020 2120   HGBUR NEGATIVE 06/16/2020 2120   BILIRUBINUR NEGATIVE 06/16/2020 2120   KETONESUR NEGATIVE 06/16/2020 2120   PROTEINUR NEGATIVE 06/16/2020 2120   NITRITE NEGATIVE 06/16/2020 2120   LEUKOCYTESUR NEGATIVE 06/16/2020 2120     Veto Macqueen M.D. [facility] 06/21/2020, 2:39 PM   Call night coverage person covering after 7pm

## 2020-06-22 DIAGNOSIS — R931 Abnormal findings on diagnostic imaging of heart and coronary circulation: Secondary | ICD-10-CM

## 2020-06-22 LAB — GLUCOSE, CAPILLARY: Glucose-Capillary: 130 mg/dL — ABNORMAL HIGH (ref 70–99)

## 2020-06-22 LAB — BASIC METABOLIC PANEL
Anion gap: 10 (ref 5–15)
BUN: 17 mg/dL (ref 8–23)
CO2: 30 mmol/L (ref 22–32)
Calcium: 8.4 mg/dL — ABNORMAL LOW (ref 8.9–10.3)
Chloride: 100 mmol/L (ref 98–111)
Creatinine, Ser: 0.68 mg/dL (ref 0.44–1.00)
GFR calc Af Amer: >60 mL/min (ref >60)
GFR calc non Af Amer: >60 mL/min (ref >60)
Glucose, Bld: 152 mg/dL — ABNORMAL HIGH (ref 70–99)
Potassium: 3.7 mmol/L (ref 3.5–5.1)
Sodium: 140 mmol/L (ref 135–145)

## 2020-06-22 MED ORDER — METHOCARBAMOL 500 MG PO TABS: ORAL_TABLET | ORAL | 0 refills | Status: AC

## 2020-06-22 MED ORDER — HYDROCODONE-ACETAMINOPHEN 5-325 MG PO TABS: 1.0000 | ORAL_TABLET | Freq: Four times a day (QID) | ORAL | 0 refills | Status: AC | PRN

## 2020-06-22 MED ORDER — AMOXICILLIN 500 MG PO CAPS
500.0000 mg | ORAL_CAPSULE | Freq: Three times a day (TID) | ORAL | Status: DC
  Filled 2020-06-22 (×2): qty 1

## 2020-06-22 MED ORDER — AMOXICILLIN 500 MG PO CAPS: 500.0000 mg | ORAL_CAPSULE | Freq: Three times a day (TID) | ORAL | 0 refills | Status: AC

## 2020-06-22 MED ORDER — POLYETHYLENE GLYCOL 3350 17 G PO PACK: 17.0000 g | PACK | Freq: Every day | ORAL | 0 refills | Status: AC | PRN

## 2020-06-22 MED ORDER — BENZONATATE 100 MG PO CAPS: 100.0000 mg | ORAL_CAPSULE | Freq: Three times a day (TID) | ORAL | 0 refills | Status: AC | PRN

## 2020-06-22 MED ORDER — LIDOCAINE 5 % EX PTCH: 2.0000 | MEDICATED_PATCH | CUTANEOUS | 0 refills | Status: AC

## 2020-06-22 NOTE — Discharge Summary (Signed)
Physician Discharge Summary   Patient ID: Kelli Rivera MRN: [id number] DOB/AGE: [birth year] 69 y.o.  Admit date: 06/16/2020 Discharge date: 06/22/2020  Primary Care Physician:  [name], MD   Recommendations for Outpatient Follow-up:  1. Follow up with PCP in 1-2 weeks 2. Outpatient follow-up with orthopedics in 2 weeks for possible ESI in L4-L5 area 3. Amoxicillin 500 mg 3 times daily for 7 days  Home Health: Home health PT OT Equipment/Devices:   Discharge Condition: stable  CODE STATUS: FULL  Diet recommendation: Carb modified diet   Discharge Diagnoses:    . Sepsis ([place]) Acute on chronic back pain . Diabetes mellitus type 2 in obese ([place]) . Chronic dermatitis . RA (rheumatoid arthritis) ([place]) . Streptococcal bacteremia E. coli UTI Systolic CHF Obesity  Consults: Infectious disease, Dr. Linus Salmons  Orthopedics, Dr. Ninfa Linden    Allergies:   Allergies  Allergen Reactions  . Liraglutide Nausea And Vomiting  . Pioglitazone Swelling  . Amoxicillin-Pot Clavulanate Nausea And Vomiting    Did it involve swelling of the face/tongue/throat, SOB, or low BP? No Did it involve sudden or severe rash/hives, skin peeling, or any reaction on the inside of your mouth or nose? No Did you need to seek medical attention at a hospital or doctor's office? No When did it last happen?less than 10 years If all above answers are "NO", may proceed with cephalosporin use.      DISCHARGE MEDICATIONS: Allergies as of 06/22/2020      Reactions   Liraglutide Nausea And Vomiting   Pioglitazone Swelling   Amoxicillin-pot Clavulanate Nausea And Vomiting   Did it involve swelling of the face/tongue/throat, SOB, or low BP? No Did it involve sudden or severe rash/hives, skin peeling, or any reaction on the inside of your mouth or nose? No Did you need to seek medical attention at a hospital or doctor's office? No When did it last happen?less than 10 years If all above  answers are "NO", may proceed with cephalosporin use.      Medication List    STOP taking these medications   traMADol 50 MG tablet Commonly known as: ULTRAM     TAKE these medications   Advair Diskus 500-50 MCG/DOSE Aepb Generic drug: Fluticasone-Salmeterol Inhale 1 puff into the lungs 2 (two) times daily.   albuterol 108 (90 Base) MCG/ACT inhaler Commonly known as: VENTOLIN HFA Inhale 2 puffs into the lungs every 4 (four) hours as needed for wheezing or shortness of breath.   amoxicillin 500 MG capsule Commonly known as: AMOXIL Take 1 capsule (500 mg total) by mouth 3 (three) times daily for 7 days.   aspirin EC 81 MG tablet Take 81 mg by mouth daily.   atorvastatin 80 MG tablet Commonly known as: LIPITOR Take 1 tablet (80 mg total) by mouth daily at 6 PM.   benzonatate 100 MG capsule Commonly known as: TESSALON Take 1 capsule (100 mg total) by mouth 3 (three) times daily as needed for cough.   CENTRUM SILVER 50+WOMEN PO Take 1 tablet by mouth daily.   Enbrel SureClick 50 MG/ML injection Generic drug: etanercept Inject 50 mg into the skin once a week.   Farxiga 5 MG Tabs tablet Generic drug: dapagliflozin propanediol Take 5 mg by mouth daily.   fluticasone 50 MCG/ACT nasal spray Commonly known as: FLONASE Place 2 sprays into both nostrils daily as needed for allergies.   furosemide 40 MG tablet Commonly known as: LASIX Take 1 tablet (40 mg total) by mouth daily.  HYDROcodone-acetaminophen 5-325 MG tablet Commonly known as: NORCO/VICODIN Take 1 tablet by mouth every 6 (six) hours as needed for moderate pain or severe pain.   ipratropium-albuterol 0.5-2.5 (3) MG/3ML Soln Commonly known as: DUONEB Take 3 mLs by nebulization 4 (four) times daily. What changed:   when to take this  reasons to take this   lidocaine 5 % Commonly known as: LIDODERM Place 2 patches onto the skin daily. Remove & Discard patch within 12 hours or as directed by MD    metFORMIN 1000 MG tablet Commonly known as: GLUCOPHAGE Take 1,000 mg by mouth daily with breakfast.   methocarbamol 500 MG tablet Commonly known as: Robaxin Please take 3 times daily for 7 days, then 3 times daily as needed for muscle spasms/back pain   montelukast 10 MG tablet Commonly known as: SINGULAIR Take 10 mg by mouth every morning.   oxybutynin 15 MG 24 hr tablet Commonly known as: DITROPAN XL Take 15 mg by mouth daily.   pantoprazole 20 MG tablet Commonly known as: PROTONIX Take 1 tablet (20 mg total) by mouth 2 (two) times daily.   polyethylene glycol 17 g packet Commonly known as: MIRALAX / GLYCOLAX Take 17 g by mouth daily as needed for mild constipation or moderate constipation. Also over the counter   sucralfate 1 g tablet Commonly known as: Carafate Take 1 tablet (1 g total) by mouth 4 (four) times daily -  with meals and at bedtime for 7 days.   Toujeo SoloStar 300 UNIT/ML Solostar Pen Generic drug: insulin glargine (1 Unit Dial) Inject 20 Units into the skin at bedtime.   Trulicity 1.5 FY/9.2KM Sopn Generic drug: Dulaglutide Inject 1.5 mg into the skin every Thursday.        Brief H and P: For complete details please refer to admission H and P, but in brief Kelli P Robertsis a 69 y.o.femalewithhistory of diabetes mellitus type 2, systolic dysfunction, COPD, rheumatoid arthritis presents to the ER because of fever and chills with low back pain. Patient started having fever chills this morning. No associated shortness of breath chest pain nausea vomiting diarrhea or dysuria. Patient has been having low back pain for the last 3 weeks and has been taking over-the-counter medications. Denies any incontinence of bowel or urine. Has pain on moving her left lower extremity and movement is decreased because of the pain.  ED Course:In the ER patient was febrile with temperature 103 F tachycardic with leukocytosis lactic acid was normal. Blood cultures  were sent. Chest x-ray UA unremarkable Covid test was negative. CT abdomen pelvis done shows left lumbar fat-containing hernia otherwise nothing acute. Patient started on empiric antibiotics for possible sepsis. Blood culture x 2 positive for strep agalactiae.   Hospital Course:   Sepsis with strep agalactiae bacteremia - Patient presented with fever 102.1 F, hypotension, tachypnea, leukocytosis 16.5, blood cultures positive for Streptococcus agalactiae.  Patient also complained of back pain, acute on chronic. - MRI lumbar spine was negative for acute infection or fluid collection. - 2D echo showed no vegetations. - Left knee x-ray showed no severe effusion, venous Dopplers negative for DVT - Procalcitonin improving 11.75 (<-21.7), CRP 10.1, ESR mildly elevated at 36 - Repeat blood cultures negative so far - Venous doppler LE negative for DVT -ID was consulted, patient was seen by Dr Linus Salmons.  Patient was placed on IV penicillin.   -Continue amoxicillin 500 mg 3 times daily for 7 days per ID recommendation.   Acute on chronic back pain -  She has underlying history of rheumatoid arthritis. -Discussed with ID, Dr Linus Salmons, repeating MRI of the spine is not needed as no worsening in the clinical status, no leukocytosis or fevers, no FND's, will likely benefit repeating MRI in a week after completing antibiotics. -Orthopedics was consulted, seen by Dr. Ninfa Linden, patient will benefit from [facility] to L4-L5 region however currently contraindicated in the light of bacteremia -Back pain improving, on current regimen.   -Continue Tylenol, Lidoderm patch, Robaxin, Norco as needed   E. coli UTI -Urine culture showed E. coli, sensitive to ampicillin and cephalosporins -Patient has received 3 days of IV Rocephin while inpatient    Diabetes mellitus type 2 in obese ([place]) -Continue outpatient regimen, CBG stable    RA (rheumatoid arthritis) ([place]) -Patient takes Enbrel injections, outpatient follow-up  with rheumatology outpatient  Anemia of chronic disease -Likely related to rheumatoid arthritis, hemoglobin stable  COPD -Currently stable, no active wheezing   Chronic systolic CHF -2D echo 02/27/3975 showed EF of 40 to 45% -Lasix was held on admission due to low BP, resumed home Lasix at the time of discharge   Obesity Estimated body mass index is 46.59 kg/m as calculated from the following:   Height as of this encounter: _0  (1.6 m).   Weight as of this encounter: 119.3 kg.   Day of Discharge S: No acute complaints, back pain improving, looking forward to go home today afebrile  BP (!) 118/54   Pulse 64   Temp 97.9 F (36.6 C)   Resp 16   Ht _1  (1.6 m)   Wt 119.3 kg   SpO2 96%   BMI 46.59 kg/m   Physical Exam: General: Alert and awake oriented x3 not in any acute distress. HEENT: anicteric sclera, pupils reactive to light and accommodation CVS: S1-S2 clear no murmur rubs or gallops Chest: clear to auscultation bilaterally, no wheezing rales or rhonchi Abdomen: soft nontender, nondistended, normal bowel sounds Extremities: no cyanosis, clubbing or edema noted bilaterally Neuro: Cranial nerves II-XII intact, no focal neurological deficits    Get Medicines reviewed and adjusted: Please take all your medications with you for your next visit with your Primary MD  Please request your Primary MD to go over all hospital tests and procedure/radiological results at the follow up. Please ask your Primary MD to get all Hospital records sent to his/her office.  If you experience worsening of your admission symptoms, develop shortness of breath, life threatening emergency, suicidal or homicidal thoughts you must seek medical attention immediately by calling 911 or calling your MD immediately  if symptoms less severe.  You must read complete instructions/literature along with all the possible adverse reactions/side effects for all the Medicines you take and that  have been prescribed to you. Take any new Medicines after you have completely understood and accept all the possible adverse reactions/side effects.   Do not drive when taking pain medications.   Do not take more than prescribed Pain, Sleep and Anxiety Medications  Special Instructions: If you have smoked or chewed Tobacco  in the last 2 yrs please stop smoking, stop any regular Alcohol  and or any Recreational drug use.  Wear Seat belts while driving.  Please note  You were cared for by a hospitalist during your hospital stay. Once you are discharged, your primary care physician will handle any further medical issues. Please note that NO REFILLS for any discharge medications will be authorized once you are discharged, as it is imperative that you return  to your primary care physician (or establish a relationship with a primary care physician if you do not have one) for your aftercare needs so that they can reassess your need for medications and monitor your lab values.   The results of significant diagnostics from this hospitalization (including imaging, microbiology, ancillary and laboratory) are listed below for reference.      Procedures/Studies:  DG Chest 2 View  Result Date: 06/06/2020 CLINICAL DATA:  Indigestion. Elevated D-dimer. No reported shortness of breath. EXAM: CHEST - 2 VIEW COMPARISON:  01/22/2020 FINDINGS: Cardiac silhouette is normal in size. No mediastinal or hilar masses. No evidence of adenopathy. Clear lungs.  No pleural effusion or pneumothorax. Skeletal structures are intact. IMPRESSION: No active cardiopulmonary disease. Electronically Signed   By: Lajean Manes M.D.   On: 06/06/2020 13:32   CT Angio Chest PE W and/or Wo Contrast  Result Date: 06/06/2020 CLINICAL DATA:  Pt with elevated d-dimer sent to ED from PMD's office; pt states she has chronic SOB and leg swelling; diabetic; no h/o PE or blood clots EXAM: CT ANGIOGRAPHY CHEST WITH CONTRAST TECHNIQUE:  Multidetector CT imaging of the chest was performed using the standard protocol during bolus administration of intravenous contrast. Multiplanar CT image reconstructions and MIPs were obtained to evaluate the vascular anatomy. CONTRAST:  121m OMNIPAQUE IOHEXOL 350 MG/ML SOLN COMPARISON:  Current chest radiograph. FINDINGS: Cardiovascular: There is satisfactory opacification of the pulmonary arteries to the segmental level. There is no evidence of a pulmonary embolism. Heart is top-normal in size. No pericardial effusion. No coronary artery calcifications. Aorta is normal in caliber. No dissection mild atherosclerosis along the arch. Branch vessels are widely patent. Mediastinum/Nodes: Thyroid nodules, with evidence of a nodule enlarging the left lobe, at least 2 cm in size. No neck base axillary, mediastinal or hilar masses or enlarged lymph nodes. Trachea and esophagus are unremarkable. Lungs/Pleura: No pneumonia or pulmonary edema. No mass or suspicious nodule. Peripheral interstitial thickening mild changes of centrilobular emphysema. No pleural effusion or pneumothorax. Upper Abdomen: No acute abnormality. Musculoskeletal: No fracture or acute finding.  No bone lesion. Review of the MIP images confirms the above findings. IMPRESSION: 1. No evidence of a pulmonary embolism. 2. No acute findings. 3. Chronic appearing interstitial thickening. Mild centrilobular emphysema. 4. Mild aortic atherosclerosis. Aortic Atherosclerosis (ICD10-I70.0) and Emphysema (ICD10-J43.9). Electronically Signed   By: DLajean ManesM.D.   On: 06/06/2020 14:45   MR Lumbar Spine W Wo Contrast  Result Date: 06/17/2020 CLINICAL DATA:  Low back pain radiating down the left leg with numbness and weakness. EXAM: MRI LUMBAR SPINE WITHOUT AND WITH CONTRAST TECHNIQUE: Multiplanar and multiecho pulse sequences of the lumbar spine were obtained without and with intravenous contrast. CONTRAST:  139mGADAVIST GADOBUTROL 1 MMOL/ML IV SOLN  COMPARISON:  CT scan 06/16/2020 and lumbar MRI from 03/29/2017 FINDINGS: Segmentation: The lowest lumbar type non-rib-bearing vertebra is labeled as L5. Alignment:  No vertebral subluxation is observed. Vertebrae: Type 3 degenerative endplate findings at L4R6-7ith disc desiccation and loss of disc height at this level. Left facet and perifacet edema and enhancement at the L3-4 level attributed to degenerative arthropathy. No compelling findings of discitis or osteomyelitis. Conus medullaris and cauda equina: Conus extends to the L1-2 level. Conus and cauda equina appear normal, without abnormal enhancement or clumping of the cauda equina to suggest arachnoiditis. Paraspinal and other soft tissues: There is evidence of a chronic left lumbar hernia, with herniation of left perirenal adipose tissue into the subcutaneous tissues, although  review of prior studies indicates this to be a chronic hernia, and review of recent CT reveals no significant degree of abnormal associated inflammation. Disc levels: T11-12: Borderline right foraminal stenosis due to disc bulge and right inferior foraminal disc protrusion. This level is only included on the parasagittal images. T12-L1: No impingement.  Small central disc protrusion. L1-2: No impingement. Mild disc bulge with small central annular tear. L2-3: Unremarkable. L3-4: No impingement. Disc bulge and bilateral degenerative facet arthropathy. L4-5: Mild bilateral foraminal stenosis and mild right subarticular lateral recess stenosis due to disc bulge, intervertebral spurring, and mild facet arthropathy. There is also a conjoined appearance of the left L4 and L5 nerve roots which contributes to low position of the left L4 nerve roots in the neural foramen which is likewise factor mild left foraminal stenosis. Impingement at this level is mildly worsened compared to 03/29/2017. L5-S1: No impingement. Small left paracentral disc protrusion. The broad left transverse process of L5  appears to mildly pseudo articulate with the sacrum, without obvious edema signal to suggest Bertolotti syndrome. IMPRESSION: 1. Lumbar spondylosis and degenerative disc disease causing mild bilateral impingement at L4-5, mildly worsened from 03/29/2017. 2. Left facet and perifacet edema and low-grade enhancement at the L3-4 level is notably increased from 03/29/2017, but probably due to degenerative facet arthropathy given the morphology of the facets. There is no overt facet joint effusion and hence I am skeptical of infected left facet joint at L3-4 as a cause for the appearance. There is no drainable collection in this vicinity. 3. The broad left transverse process of L5 appears to mildly pseudo articulate with the sacrum, but there is no obvious edema signal to suggest Bertolotti syndrome. Electronically Signed   By: Van Clines M.D.   On: 06/17/2020 07:20   CT ABDOMEN PELVIS W CONTRAST  Result Date: 06/16/2020 CLINICAL DATA:  69 year old female with left flank pain. Concern for kidney stone. EXAM: CT ABDOMEN AND PELVIS WITH CONTRAST TECHNIQUE: Multidetector CT imaging of the abdomen and pelvis was performed using the standard protocol following bolus administration of intravenous contrast. CONTRAST:  127m OMNIPAQUE IOHEXOL 300 MG/ML  SOLN COMPARISON:  CT abdomen pelvis dated 01/09/2018. FINDINGS: Lower chest: There are minimal bibasilar atelectasis. No intra-abdominal free air or free fluid. Hepatobiliary: The liver is unremarkable. No intrahepatic biliary dilatation. Cholecystectomy. Pancreas: Unremarkable. No pancreatic ductal dilatation or surrounding inflammatory changes. Spleen: Normal in size without focal abnormality. Adrenals/Urinary Tract: The adrenal glands unremarkable. There is no hydronephrosis on either side. There is symmetric enhancement and excretion of contrast by both kidneys. Subcentimeter left renal inferior pole hypodense focus is too small to characterize but appears similar  to prior CT. The visualized ureters and urinary bladder appear unremarkable. Stomach/Bowel: There is a small hiatal hernia. There is moderate stool throughout the colon. There is no bowel obstruction or active inflammation. There is a 3.5 cm distal duodenal diverticulum. No active inflammatory changes. Appendectomy. Vascular/Lymphatic: The abdominal aorta and IVC unremarkable. No portal venous gas. There is no adenopathy. Reproductive: Hysterectomy. There is a 2 cm left adnexal cyst, similar to prior CT and likely a benign or indolent process. This can be better evaluated with ultrasound on a nonemergent/outpatient basis if clinically indicated. Other: There is a small fat containing left lumbar hernia similar to prior CT. No associated inflammatory changes. Musculoskeletal: Degenerative changes of the spine. No acute osseous pathology. IMPRESSION: 1. No acute intra-abdominal or pelvic pathology. No hydronephrosis or nephrolithiasis. 2. Small left lumbar fat containing hernia similar to  prior CT. No associated inflammatory changes. 3. Moderate colonic stool burden. No bowel obstruction. 4. A 2 cm left adnexal cyst, similar to prior CT and likely a benign or indolent process. Electronically Signed   By: Anner Crete M.D.   On: 06/16/2020 20:06   DG Chest Port 1 View  Result Date: 06/16/2020 CLINICAL DATA:  69 year old female with questionable sepsis. EXAM: PORTABLE CHEST 1 VIEW COMPARISON:  Chest radiograph dated 06/06/2020 and CT dated 06/06/2020 FINDINGS: No focal consolidation, pleural effusion, or pneumothorax. Stable cardiac silhouette. Atherosclerotic calcification of the aortic arch. No acute osseous pathology. IMPRESSION: No active disease. Electronically Signed   By: Anner Crete M.D.   On: 06/16/2020 19:39   VAS [place] LOWER EXTREMITY VENOUS (DVT)  Result Date: 06/19/2020  Lower Venous DVT Study Indications: Pain.  Risk Factors: None identified. Limitations: Body habitus and poor  ultrasound/tissue interface. Comparison Study: No prior studies. Performing Technologist: Oliver Hum RVT  Examination Guidelines: A complete evaluation includes B-mode imaging, spectral Doppler, color Doppler, and power Doppler as needed of all accessible portions of each vessel. Bilateral testing is considered an integral part of a complete examination. Limited examinations for reoccurring indications may be performed as noted. The reflux portion of the exam is performed with the patient in reverse Trendelenburg.  +-----+---------------+---------+-----------+----------+--------------+ RIGHTCompressibilityPhasicitySpontaneityPropertiesThrombus Aging +-----+---------------+---------+-----------+----------+--------------+ CFV  Full           Yes      Yes                                 +-----+---------------+---------+-----------+----------+--------------+   +---------+---------------+---------+-----------+----------+--------------+ LEFT     CompressibilityPhasicitySpontaneityPropertiesThrombus Aging +---------+---------------+---------+-----------+----------+--------------+ CFV      Full           Yes      Yes                                 +---------+---------------+---------+-----------+----------+--------------+ SFJ      Full                                                        +---------+---------------+---------+-----------+----------+--------------+ FV Prox  Full                                                        +---------+---------------+---------+-----------+----------+--------------+ FV Mid   Full                                                        +---------+---------------+---------+-----------+----------+--------------+ FV Distal               Yes      Yes                                 +---------+---------------+---------+-----------+----------+--------------+ PFV      Full                                                         +---------+---------------+---------+-----------+----------+--------------+  POP      Full           Yes      Yes                                 +---------+---------------+---------+-----------+----------+--------------+ PTV      Full                                                        +---------+---------------+---------+-----------+----------+--------------+ PERO     Full                                                        +---------+---------------+---------+-----------+----------+--------------+     Summary: RIGHT: - No evidence of common femoral vein obstruction.  LEFT: - There is no evidence of deep vein thrombosis in the lower extremity. However, portions of this examination were limited- see technologist comments above.  - No cystic structure found in the popliteal fossa.  *See table(s) above for measurements and observations. Electronically signed by Monica Martinez MD on 06/19/2020 at 3:07:36 PM.    Final    ECHOCARDIOGRAM LIMITED  Result Date: 06/18/2020    ECHOCARDIOGRAM LIMITED REPORT   Patient Name:   Kelli Rivera Date of Exam: 06/18/2020 Medical Rec #:  [id number]      Height:       63.0 in Accession #:    [id number]     Weight:       263.0 lb Date of Birth:  June 23, 1951       BSA:          2.172 m Patient Age:    27 years       BP:           127/48 mmHg Patient Gender: F              HR:           78 bpm. Exam Location:  Inpatient Procedure: Limited Echo Indications:    bacteremia  History:        Patient has prior history of Echocardiogram examinations, most                 recent 01/23/2020. COPD; Risk Factors:Diabetes, Dyslipidemia and                 Former Smoker.  Sonographer:    Jannett Celestine RDCS (AE) Referring Phys: [place] Comments: Technically difficult study due to poor echo windows and Technically challenging study due to limited acoustic windows. IMPRESSIONS  1. Left ventricular ejection fraction, by estimation, is 40 to 45%. The  left ventricle has mildly decreased function. The left ventricle demonstrates global hypokinesis.  2. Right ventricular systolic function is normal. The right ventricular size is normal.  3. The mitral valve is grossly normal. No evidence of mitral valve regurgitation. No evidence of mitral stenosis.  4. The aortic valve is tricuspid. Aortic valve regurgitation is not visualized. No aortic stenosis is present.  5. The inferior vena cava is dilated in size with <50% respiratory variability, suggesting right atrial pressure  of 15 mmHg. Comparison(s): No significant change from prior study. Conclusion(s)/Recommendation(s): No evidence of valvular vegetations on this transthoracic echocardiogram. Would recommend a transesophageal echocardiogram to exclude infective endocarditis if clinically indicated. FINDINGS  Left Ventricle: Left ventricular ejection fraction, by estimation, is 40 to 45%. The left ventricle has mildly decreased function. The left ventricle demonstrates global hypokinesis. The left ventricular internal cavity size was normal in size. There is  no left ventricular hypertrophy. Right Ventricle: The right ventricular size is normal. No increase in right ventricular wall thickness. Right ventricular systolic function is normal. Left Atrium: Left atrial size was normal in size. Right Atrium: Right atrial size was normal in size. Pericardium: Trivial pericardial effusion is present. Mitral Valve: The mitral valve is grossly normal. No evidence of mitral valve stenosis. Tricuspid Valve: The tricuspid valve is grossly normal. Tricuspid valve regurgitation is not demonstrated. No evidence of tricuspid stenosis. Aortic Valve: The aortic valve is tricuspid. Aortic valve regurgitation is not visualized. No aortic stenosis is present. Pulmonic Valve: The pulmonic valve was grossly normal. Pulmonic valve regurgitation is not visualized. No evidence of pulmonic stenosis. Venous: The inferior vena cava is dilated in  size with less than 50% respiratory variability, suggesting right atrial pressure of 15 mmHg. IAS/Shunts: The atrial septum is grossly normal. Eleonore Chiquito MD Electronically signed by Eleonore Chiquito MD Signature Date/Time: 06/18/2020/3:16:59 PM    Final    DG Knee 3 Views Left  Result Date: 06/18/2020 CLINICAL DATA:  Pain EXAM: LEFT KNEE - 3 VIEW COMPARISON:  None. FINDINGS: Frontal, tunnel, and lateral views obtained. There is diffuse soft tissue swelling. No fracture or dislocation. There is a small joint effusion. There is generalized joint space narrowing with spurring in all compartments. No erosive changes. IMPRESSION: Generalized osteoarthritic change. Small joint effusion. Soft tissue swelling evident. No fracture or dislocation. No erosion. Electronically Signed   By: Lowella Grip III M.D.   On: 06/18/2020 10:12       LAB RESULTS: Basic Metabolic Panel: Recent Labs  Lab 06/21/20 0510 06/22/20 0334  NA 142 140  K 3.9 3.7  CL 100 100  CO2 30 30  GLUCOSE 118* 152*  BUN 18 17  CREATININE 0.58 0.68  CALCIUM 8.6* 8.4*   Liver Function Tests: Recent Labs  Lab 06/16/20 2012 06/17/20 0030  AST 38 58*  ALT 24 36  ALKPHOS 69 66  BILITOT 0.4 0.7  PROT 6.2* 5.3*  ALBUMIN 3.0* 2.6*   No results for input(s): LIPASE, AMYLASE in the last 168 hours. No results for input(s): AMMONIA in the last 168 hours. CBC: Recent Labs  Lab 06/18/20 0253 06/19/20 0821 06/20/20 0325 06/20/20 0325 06/21/20 0510  WBC 11.9*   < > 9.7  --  10.2  NEUTROABS 8.0*  --   --   --   --   HGB 9.8*   < > 10.0*  --  9.9*  HCT 33.4*   < > 33.4*  --  32.9*  MCV 83.7   < > 81.5   < > 81.4  PLT 162   < > 210  --  194   < > = values in this interval not displayed.   Cardiac Enzymes: No results for input(s): CKTOTAL, CKMB, CKMBINDEX, TROPONINI in the last 168 hours. BNP: Invalid input(s): POCBNP CBG: Recent Labs  Lab 06/21/20 2104 06/22/20 0815  GLUCAP 152* 130*       Disposition and  Follow-up: Discharge Instructions    Diet Carb Modified   Complete  by: As directed    Increase activity slowly   Complete by: As directed        DISPOSITION: Home with home health PT OT   [place], [facility] Follow up.   Specialty: [place] Why: This is the agency that will be providing physical therapy in the home Contact information: [place] [address] [phone]        [name], MD. Schedule an appointment as soon as possible for a visit in 2 week(s).   Specialty: Family Medicine Contact information: [address] [place] [address] [phone]        Troy Sine, MD .   Specialty: Cardiology Contact information: [address] [place] [address] [phone]        Mcarthur Rossetti, MD. Schedule an appointment as soon as possible for a visit in 2 week(s).   Specialty: Orthopedic Surgery Contact information: [place] [address] [phone]                Time coordinating discharge:  35 minutes  Signed:   Estill Cotta M.D. [facility] 06/22/2020, 11:52 AM

## 2020-06-22 NOTE — Progress Notes (Signed)
Discussed with patient and daughter discharge instructions, they verbalized agreement and understanding.  Patient to leave in private vehicle with all belongings.   

## 2020-06-23 LAB — CULTURE, BLOOD (ROUTINE X 2)
Culture: NO GROWTH
Culture: NO GROWTH
Special Requests: ADEQUATE
Special Requests: ADEQUATE

## 2020-07-02 ENCOUNTER — Ambulatory Visit (INDEPENDENT_AMBULATORY_CARE_PROVIDER_SITE_OTHER): Payer: Medicare Other | Admitting: Physician Assistant

## 2020-07-02 ENCOUNTER — Encounter: Payer: Self-pay | Admitting: Physician Assistant

## 2020-07-02 DIAGNOSIS — M5416 Radiculopathy, lumbar region: Secondary | ICD-10-CM

## 2020-07-02 NOTE — Progress Notes (Signed)
Office Visit Note   Patient: Kelli Rivera           Date of Birth: November 11, 1950           MRN: [id number] Visit Date: 07/02/2020              Requested by: Angelica Chessman, MD [address] [place],  [place] [address] PCP: Angelica Chessman, MD   Assessment & Plan: Visit Diagnoses:  1. Lumbar radicular pain     Plan: We'll send her to formal physical therapy for back exercises, stretching exercises home exercise program modalities.  Would not recommend any steroid injections or oral steroids at this point given her recent bacteremia and UTI.  See her back in 4 weeks may consider at that point time to have facet injections on the left at L4-5.  Questions were encouraged and answered at length today.  She'll continue her Robaxin and Tylenol.  Follow-Up Instructions: Return in about 4 weeks (around 07/30/2020).   Orders:  No orders of the defined types were placed in this encounter.  No orders of the defined types were placed in this encounter.     Procedures: No procedures performed   Clinical Data: No additional findings.   Subjective: Chief Complaint  Patient presents with  . Lower Back - Pain    HPI  Ms. Kelli Rivera is a 69 year old female who follows up with [place] today for low back pain she is seen by Dr. Magnus Rivera while she was in the hospital with streptococcal bacteremia and E. coli UTI.  At that point time she was having severe back pain and had undergone an MRI which showed significant arthritic changes at L4-5 and mild left foraminal stenosis at L4-5.  She continues to have pain that radiates down her left leg and some numbness.  She notes weakness left leg.  She is ambulating today with a rolling walker.  She denies any fevers or chills.  Review of Systems Please see HPI otherwise negative noncontributory.  Objective: Vital Signs: There were no vitals taken for this visit.  Physical Exam Constitutional:      Appearance: She is not ill-appearing or  diaphoretic.  Pulmonary:     Effort: Pulmonary effort is normal.  Neurological:     Mental Status: She is alert and oriented to person, place, and time.  Psychiatric:        Mood and Affect: Mood normal.     Ortho Exam Bilateral lower extremities 5 out of 5 strength throughout lower extremities against resistance.  Positive straight leg raise on the left negative on the right. Specialty Comments:  No specialty comments available.  Imaging: No results found.   PMFS History: Patient Active Problem List   Diagnosis Date Noted  . Streptococcal bacteremia 06/17/2020  . SIRS (systemic inflammatory response syndrome) (HCC) 06/16/2020  . Agatston coronary artery calcium score less than 100   . Type 2 diabetes mellitus without complication, with long-term current use of insulin (HCC)   . Chest pain   . NSTEMI (non-ST elevated myocardial infarction) (HCC) 01/22/2020  . Acute respiratory failure with hypoxia (HCC) 01/01/2019  . Chronic diastolic CHF (congestive heart failure) (HCC) 01/01/2019  . Mild renal insufficiency 01/01/2019  . Chronic dermatitis 07/06/2018  . Venous insufficiency of both lower extremities 07/06/2018  . Palmar erythema 07/06/2018  . RA (rheumatoid arthritis) (HCC) 07/06/2018  . DJD (degenerative joint disease) 07/06/2018  . OSA (obstructive sleep apnea) 07/06/2018  . Eosinophilia 07/06/2018  .  GERD (gastroesophageal reflux disease) 09/24/2017  . Iron deficiency 09/24/2017  . Leukocytosis 09/23/2016  . Morbid obesity (HCC) 09/23/2016  . COPD with acute exacerbation (HCC) 09/23/2016  . Hyperlipidemia 09/23/2016  . Diabetes mellitus type 2 in obese (HCC) 09/23/2016   Past Medical History:  Diagnosis Date  . Asthma   . Collagen vascular disease (HCC)   . COPD (chronic obstructive pulmonary disease) (HCC)    O2 increased to 3L while ambulating, 2L at rest as of last week  . Diabetes mellitus without complication (HCC)   . Feeling of chest tightness 12/2019    . High cholesterol   . History of leukocytosis    negative bone marrow, followed by heme q83mo  . Morbid obesity (HCC)     Family History  Problem Relation Age of Onset  . Heart failure Mother 46  . Lung cancer Father 4    Past Surgical History:  Procedure Laterality Date  . ABDOMINAL HYSTERECTOMY    . APPENDECTOMY     age 89 yrs  . bladder tuck    . CHOLECYSTECTOMY     Social History   Occupational History  . Not on file  Tobacco Use  . Smoking status: Former Smoker    Packs/day: 1.00    Years: 42.00    Pack years: 42.00    Quit date: 2007    Years since quitting: 14.6  . Smokeless tobacco: Never Used  Vaping Use  . Vaping Use: Never used  Substance and Sexual Activity  . Alcohol use: No  . Drug use: No  . Sexual activity: Not on file

## 2020-07-03 ENCOUNTER — Other Ambulatory Visit: Payer: Self-pay | Admitting: Radiology

## 2020-07-03 DIAGNOSIS — M5416 Radiculopathy, lumbar region: Secondary | ICD-10-CM

## 2020-07-09 ENCOUNTER — Ambulatory Visit: Payer: Medicare Other | Admitting: Physical Therapy

## 2020-07-30 ENCOUNTER — Ambulatory Visit: Payer: Medicare Other | Admitting: Physician Assistant

## 2020-08-01 ENCOUNTER — Other Ambulatory Visit: Payer: Self-pay | Admitting: Cardiology

## 2020-09-11 ENCOUNTER — Encounter (HOSPITAL_BASED_OUTPATIENT_CLINIC_OR_DEPARTMENT_OTHER): Payer: Self-pay | Admitting: *Deleted

## 2020-09-11 ENCOUNTER — Emergency Department (HOSPITAL_BASED_OUTPATIENT_CLINIC_OR_DEPARTMENT_OTHER)
Admission: EM | Admit: 2020-09-11 | Discharge: 2020-09-11 | Disposition: A | Discharge status: Home / Self Care | Payer: Medicare Other | Attending: Emergency Medicine | Admitting: Emergency Medicine

## 2020-09-11 ENCOUNTER — Emergency Department (HOSPITAL_BASED_OUTPATIENT_CLINIC_OR_DEPARTMENT_OTHER): Payer: Medicare Other

## 2020-09-11 ENCOUNTER — Other Ambulatory Visit: Payer: Self-pay

## 2020-09-11 DIAGNOSIS — E119 Type 2 diabetes mellitus without complications: Secondary | ICD-10-CM | POA: Diagnosis not present

## 2020-09-11 DIAGNOSIS — R799 Abnormal finding of blood chemistry, unspecified: Secondary | ICD-10-CM | POA: Diagnosis present

## 2020-09-11 DIAGNOSIS — Z87891 Personal history of nicotine dependence: Secondary | ICD-10-CM | POA: Diagnosis not present

## 2020-09-11 DIAGNOSIS — D72829 Elevated white blood cell count, unspecified: Secondary | ICD-10-CM | POA: Diagnosis not present

## 2020-09-11 DIAGNOSIS — Z7982 Long term (current) use of aspirin: Secondary | ICD-10-CM | POA: Diagnosis not present

## 2020-09-11 DIAGNOSIS — J441 Chronic obstructive pulmonary disease with (acute) exacerbation: Secondary | ICD-10-CM | POA: Insufficient documentation

## 2020-09-11 DIAGNOSIS — J45909 Unspecified asthma, uncomplicated: Secondary | ICD-10-CM | POA: Diagnosis not present

## 2020-09-11 DIAGNOSIS — I5032 Chronic diastolic (congestive) heart failure: Secondary | ICD-10-CM | POA: Insufficient documentation

## 2020-09-11 DIAGNOSIS — Z7984 Long term (current) use of oral hypoglycemic drugs: Secondary | ICD-10-CM | POA: Insufficient documentation

## 2020-09-11 LAB — COMPREHENSIVE METABOLIC PANEL
ALT: 9 U/L (ref 0–44)
AST: 10 U/L — ABNORMAL LOW (ref 15–41)
Albumin: 2.9 g/dL — ABNORMAL LOW (ref 3.5–5.0)
Alkaline Phosphatase: 82 U/L (ref 38–126)
Anion gap: 10 (ref 5–15)
BUN: 17 mg/dL (ref 8–23)
CO2: 28 mmol/L (ref 22–32)
Calcium: 9 mg/dL (ref 8.9–10.3)
Chloride: 103 mmol/L (ref 98–111)
Creatinine, Ser: 0.93 mg/dL (ref 0.44–1.00)
GFR, Estimated: >60 mL/min (ref >60)
Glucose, Bld: 245 mg/dL — ABNORMAL HIGH (ref 70–99)
Potassium: 3.9 mmol/L (ref 3.5–5.1)
Sodium: 141 mmol/L (ref 135–145)
Total Bilirubin: 0.5 mg/dL (ref 0.3–1.2)
Total Protein: 6.4 g/dL — ABNORMAL LOW (ref 6.5–8.1)

## 2020-09-11 LAB — CBC WITH DIFFERENTIAL/PLATELET
Abs Immature Granulocytes: 0.35 10*3/uL — ABNORMAL HIGH (ref 0.00–0.07)
Basophils Absolute: 0.1 10*3/uL (ref 0.0–0.1)
Basophils Relative: 0 %
Eosinophils Absolute: 4.6 10*3/uL — ABNORMAL HIGH (ref 0.0–0.5)
Eosinophils Relative: 24 %
HCT: 38.9 % (ref 36.0–46.0)
Hemoglobin: 11.8 g/dL — ABNORMAL LOW (ref 12.0–15.0)
Immature Granulocytes: 2 %
Lymphocytes Relative: 12 %
Lymphs Abs: 2.3 10*3/uL (ref 0.7–4.0)
MCH: 23.8 pg — ABNORMAL LOW (ref 26.0–34.0)
MCHC: 30.3 g/dL (ref 30.0–36.0)
MCV: 78.6 fL — ABNORMAL LOW (ref 80.0–100.0)
Monocytes Absolute: 0.4 10*3/uL (ref 0.1–1.0)
Monocytes Relative: 2 %
Neutro Abs: 11.2 10*3/uL — ABNORMAL HIGH (ref 1.7–7.7)
Neutrophils Relative %: 60 %
Platelets: 200 10*3/uL (ref 150–400)
RBC: 4.95 MIL/uL (ref 3.87–5.11)
RDW: 16.5 % — ABNORMAL HIGH (ref 11.5–15.5)
WBC: 18.9 10*3/uL — ABNORMAL HIGH (ref 4.0–10.5)
nRBC: 0 % (ref 0.0–0.2)

## 2020-09-11 LAB — URINALYSIS, ROUTINE W REFLEX MICROSCOPIC
Bilirubin Urine: NEGATIVE
Glucose, UA: NEGATIVE mg/dL
Hgb urine dipstick: NEGATIVE
Ketones, ur: NEGATIVE mg/dL
Leukocytes,Ua: NEGATIVE
Nitrite: NEGATIVE
Protein, ur: NEGATIVE mg/dL
Specific Gravity, Urine: 1.025 (ref 1.005–1.030)
pH: 5.5 (ref 5.0–8.0)

## 2020-09-11 LAB — LACTIC ACID, PLASMA: Lactic Acid, Venous: 1.3 mmol/L (ref 0.5–1.9)

## 2020-09-11 MED ORDER — SODIUM CHLORIDE 0.9 % IV SOLN
1.0000 g | Freq: Once | INTRAVENOUS | Status: AC
  Administered 2020-09-11: 1 g via INTRAVENOUS
  Filled 2020-09-11: qty 10

## 2020-09-11 MED ORDER — SODIUM CHLORIDE 0.9 % IV BOLUS
1000.0000 mL | Freq: Once | INTRAVENOUS | Status: AC
  Administered 2020-09-11: 1000 mL via INTRAVENOUS

## 2020-09-11 MED ORDER — CEPHALEXIN 500 MG PO CAPS: 500.0000 mg | ORAL_CAPSULE | Freq: Three times a day (TID) | ORAL | 0 refills | Status: AC

## 2020-09-11 NOTE — ED Notes (Signed)
Pt refused COVID test at this time; sts the doctor told her it would just need to be done if she were admitted; will hold for now.

## 2020-09-11 NOTE — Discharge Instructions (Signed)
Your white blood cell count is elevated.  This can be viral or can be early bacterial infection.  We sent off blood cultures and if your cultures are positive you will be called and you will be admitted at that time.  Please take Keflex 3 times daily since you had previous UTIs causing the symptoms.  See your doctor for follow-up  Return to ER if you have fever, vomiting, abdominal pain, chest pain, cough.

## 2020-09-11 NOTE — ED Triage Notes (Addendum)
Sent by her PCP to come here d/t abnormal labs

## 2020-09-11 NOTE — ED Notes (Signed)
PT sts she was told her WBC was high- denies any sxs

## 2020-09-11 NOTE — ED Provider Notes (Signed)
[facility] EMERGENCY DEPARTMENT Provider Note   CSN: [id number] Arrival date & time: 09/11/20  1731     History Chief Complaint  Patient presents with  . Abnormal labs    Kelli Rivera is a 69 y.o. female hx of COPD, DM, here with abnormal labs. Patient states that she has no energy for the last several days.  She just feels tired all the time.  Patient denies any fevers. Patient has no dysuria or vomiting. Denies any cough or chest pain.  Patient went to see her doctor yesterday and had white blood cell count of 19. Patient states that she was admitted for similar symptoms and turned out to be a UTI back in August.   The history is provided by the patient.       Past Medical History:  Diagnosis Date  . Asthma   . Collagen vascular disease (HCC)   . COPD (chronic obstructive pulmonary disease) (HCC)    O2 increased to 3L while ambulating, 2L at rest as of last week  . Diabetes mellitus without complication (HCC)   . Feeling of chest tightness 12/2019  . High cholesterol   . History of leukocytosis    negative bone marrow, followed by heme q43mo  . Morbid obesity [facility])     Patient Active Problem List   Diagnosis Date Noted  . Streptococcal bacteremia 06/17/2020  . SIRS (systemic inflammatory response syndrome) (HCC) 06/16/2020  . Agatston coronary artery calcium score less than 100   . Type 2 diabetes mellitus without complication, with long-term current use of insulin (HCC)   . Chest pain   . NSTEMI (non-ST elevated myocardial infarction) (HCC) 01/22/2020  . Acute respiratory failure with hypoxia (HCC) 01/01/2019  . Chronic diastolic CHF (congestive heart failure) (HCC) 01/01/2019  . Mild renal insufficiency 01/01/2019  . Chronic dermatitis 07/06/2018  . Venous insufficiency of both lower extremities 07/06/2018  . Palmar erythema 07/06/2018  . RA (rheumatoid arthritis) (HCC) 07/06/2018  . DJD (degenerative joint disease) 07/06/2018  . OSA (obstructive  sleep apnea) 07/06/2018  . Eosinophilia 07/06/2018  . GERD (gastroesophageal reflux disease) 09/24/2017  . Iron deficiency 09/24/2017  . Leukocytosis 09/23/2016  . Morbid obesity (HCC) 09/23/2016  . COPD with acute exacerbation (HCC) 09/23/2016  . Hyperlipidemia 09/23/2016  . Diabetes mellitus type 2 in obese (HCC) 09/23/2016    Past Surgical History:  Procedure Laterality Date  . ABDOMINAL HYSTERECTOMY    . APPENDECTOMY     age 51 yrs  . bladder tuck    . CHOLECYSTECTOMY       OB History    Gravida  2   Para  2   Term      Preterm      AB      Living        SAB      TAB      Ectopic      Multiple      Live Births              Family History  Problem Relation Age of Onset  . Heart failure Mother 33  . Lung cancer Father 30    Social History   Tobacco Use  . Smoking status: Former Smoker    Packs/day: 1.00    Years: 42.00    Pack years: 42.00    Quit date: 2007    Years since quitting: 14.8  . Smokeless tobacco: Never Used  Vaping Use  . Vaping Use:  Never used  Substance Use Topics  . Alcohol use: No  . Drug use: No    Home Medications Prior to Admission medications   Medication Sig Start Date End Date Taking? Authorizing Provider  albuterol (PROVENTIL HFA;VENTOLIN HFA) 108 (90 Base) MCG/ACT inhaler Inhale 2 puffs into the lungs every 4 (four) hours as needed for wheezing or shortness of breath. 12/17/15   [provider]  aspirin EC 81 MG tablet Take 81 mg by mouth daily.    [provider]  atorvastatin (LIPITOR) 80 MG tablet TAKE 1 TABLET(80 MG) BY MOUTH DAILY AT 6 PM 08/01/20   Lennette Bihari, MD  benzonatate (TESSALON) 100 MG capsule Take 1 capsule (100 mg total) by mouth 3 (three) times daily as needed for cough. 06/22/20   Rai, Delene Ruffini, MD  ENBREL SURECLICK 50 MG/ML injection Inject 50 mg into the skin once a week. 01/15/20   [provider]  FARXIGA 5 MG TABS tablet Take 5 mg by mouth daily. 06/04/20    [provider]  fluticasone (FLONASE) 50 MCG/ACT nasal spray Place 2 sprays into both nostrils daily as needed for allergies. 10/11/18   [provider]  Fluticasone-Salmeterol (ADVAIR DISKUS) 500-50 MCG/DOSE AEPB Inhale 1 puff into the lungs 2 (two) times daily.     [provider]  furosemide (LASIX) 40 MG tablet Take 1 tablet (40 mg total) by mouth daily. 10/01/17   Ghimire, Werner Lean, MD  HYDROcodone-acetaminophen (NORCO/VICODIN) 5-325 MG tablet Take 1 tablet by mouth every 6 (six) hours as needed for moderate pain or severe pain. 06/22/20 06/22/21  Rai, Ripudeep K, MD  ipratropium-albuterol (DUONEB) 0.5-2.5 (3) MG/3ML SOLN Take 3 mLs by nebulization 4 (four) times daily. Patient taking differently: Take 3 mLs by nebulization every 6 (six) hours as needed (sob/wheezing).  10/01/17   Ghimire, Werner Lean, MD  lidocaine (LIDODERM) 5 % Place 2 patches onto the skin daily. Remove & Discard patch within 12 hours or as directed by MD 06/22/20   Rai, Delene Ruffini, MD  metFORMIN (GLUCOPHAGE) 1000 MG tablet Take 1,000 mg by mouth daily with breakfast.     [provider]  methocarbamol (ROBAXIN) 500 MG tablet Please take 3 times daily for 7 days, then 3 times daily as needed for muscle spasms/back pain 06/22/20   Rai, Ripudeep K, MD  montelukast (SINGULAIR) 10 MG tablet Take 10 mg by mouth every morning.  08/18/15   [provider]  Multiple Vitamins-Minerals (CENTRUM SILVER 50+WOMEN PO) Take 1 tablet by mouth daily.    [provider]  oxybutynin (DITROPAN XL) 15 MG 24 hr tablet Take 15 mg by mouth daily. 06/01/18   [provider]  pantoprazole (PROTONIX) 20 MG tablet Take 1 tablet (20 mg total) by mouth 2 (two) times daily. 06/06/20   Linwood Dibbles, MD  polyethylene glycol (MIRALAX / GLYCOLAX) 17 g packet Take 17 g by mouth daily as needed for mild constipation or moderate constipation. Also over the counter 06/22/20   Rai, Ripudeep K, MD  sucralfate  (CARAFATE) 1 g tablet Take 1 tablet (1 g total) by mouth 4 (four) times daily -  with meals and at bedtime for 7 days. 06/06/20 06/13/20  Linwood Dibbles, MD  TOUJEO SOLOSTAR 300 UNIT/ML SOPN Inject 20 Units into the skin at bedtime.  08/31/16   [provider]  TRULICITY 1.5 MG/0.5ML SOPN Inject 1.5 mg into the skin every Thursday. 09/19/17   [provider]    Allergies  Liraglutide, Pioglitazone, and Amoxicillin-pot clavulanate  Review of Systems   Review of Systems  Constitutional: Positive for fatigue.  Neurological: Positive for weakness.  All other systems reviewed and are negative.   Physical Exam Updated Vital Signs BP 130/66 (BP Location: Right Arm)   Pulse 97   Temp 98.4 F (36.9 C) (Oral)   Resp 18   Ht 5\' 3"  (1.6 m)   Wt 119.7 kg   SpO2 91%   BMI 46.77 kg/m   Physical Exam Vitals and nursing note reviewed.  Constitutional:      Appearance: Normal appearance.     Comments: Chronically ill  HENT:     Head: Normocephalic.     Mouth/Throat:     Mouth: Mucous membranes are moist.  Eyes:     Extraocular Movements: Extraocular movements intact.     Pupils: Pupils are equal, round, and reactive to light.  Cardiovascular:     Rate and Rhythm: Normal rate and regular rhythm.     Pulses: Normal pulses.  Pulmonary:     Effort: Pulmonary effort is normal.     Breath sounds: Normal breath sounds.  Abdominal:     General: Abdomen is flat.     Palpations: Abdomen is soft.  Musculoskeletal:        General: Normal range of motion.     Cervical back: Normal range of motion and neck supple.  Skin:    General: Skin is warm.     Capillary Refill: Capillary refill takes less than 2 seconds.  Neurological:     General: No focal deficit present.     Mental Status: She is alert and oriented to person, place, and time.  Psychiatric:        Mood and Affect: Mood normal.        Behavior: Behavior normal.     ED Results / Procedures / Treatments    Labs (all labs ordered are listed, but only abnormal results are displayed) Labs Reviewed  CULTURE, BLOOD (ROUTINE X 2)  CULTURE, BLOOD (ROUTINE X 2)  URINE CULTURE  RESP PANEL BY RT-PCR (FLU A&B, COVID) ARPGX2  CBC WITH DIFFERENTIAL/PLATELET  COMPREHENSIVE METABOLIC PANEL  LACTIC ACID, PLASMA  LACTIC ACID, PLASMA  URINALYSIS, ROUTINE W REFLEX MICROSCOPIC    EKG None  Radiology No results found.  Procedures Procedures (including critical care time)  Medications Ordered in ED Medications  sodium chloride 0.9 % bolus 1,000 mL (has no administration in time range)    ED Course  I have reviewed the triage vital signs and the nursing notes.  Pertinent labs & imaging results that were available during my care of the patient were reviewed by me and considered in my medical decision making (see chart for details).    MDM Rules/Calculators/A&P                         Kelli Rivera is a 69 y.o. female here with weakness and leukocytosis.  Patient was admitted for sepsis from UTI previously.  Will repeat white blood cell count here.  Patient has no fever.  Will get CBC and CMP and lactate and cultures and UA and chest x-ray.  We will also get Covid test as well.  Will hydrate and reassess.  9:32 PM WBC is 18.9.  However her lactate is normal and she is afebrile.  Her chest x-ray and urinalysis were unremarkable as well.  She actually had bacteremia last time likely from UTI.  At this point, since her lactate is normal and she is not running a fever and vitals are stable, I discussed discharge home with Keflex after given dose of Rocephin versus admission for observation.  She states that she rather go home.  I told her that if her blood cultures are positive, she will need to be admitted for IV antibiotics.    Final Clinical Impression(s) / ED Diagnoses Final diagnoses:  None    Rx / DC Orders ED Discharge Orders    None       Charlynne Pander, MD 09/11/20 2134

## 2020-09-12 LAB — URINE CULTURE: Culture: <10000 — AB

## 2020-09-15 ENCOUNTER — Ambulatory Visit: Payer: Self-pay

## 2020-09-15 NOTE — Telephone Encounter (Signed)
°   LR   Meagan P. Gargus Female, 69 y.o., 1951-08-13 MRN:  [id number] Phone:  [phone] (H) PCP:  Angelica Chessman, MD Primary Cvg:  [facility] [employer]/[facility] Medicare Message from Geronimo Boot sent at 09/15/2020 10:56 AM EST  Summary: Clinical Advice   Patient daughter wants to speak with nurse in regards to patient after visit summary that suggest she needs to be admitted if labs cam back positive. Please advise         Call History   Type Contact Phone User  09/15/2020 10:53 AM EST Phone (Incoming) Eda Keys (Emergency Contact) [phone] Dalphine Handing A   Pt.'s daughter concerned about lab work done ED and "if my mother needs to be admitted." "They said depending on what her lab work showed, she may need to be admitted." States pt.'s PCP called today but was not clear. Instructed to reach out to ED. Verbalizes understanding.

## 2020-09-16 LAB — CULTURE, BLOOD (ROUTINE X 2)
Culture: NO GROWTH
Culture: NO GROWTH
Specimen Description: ADEQUATE
Specimen Description: ADEQUATE

## 2021-05-26 ENCOUNTER — Other Ambulatory Visit: Payer: Self-pay | Admitting: Cardiovascular Disease

## 2021-09-19 ENCOUNTER — Emergency Department (HOSPITAL_BASED_OUTPATIENT_CLINIC_OR_DEPARTMENT_OTHER)
Admission: EM | Admit: 2021-09-19 | Discharge: 2021-09-19 | Disposition: A | Discharge status: Home / Self Care | Payer: Medicare Other | Attending: Emergency Medicine | Admitting: Emergency Medicine

## 2021-09-19 ENCOUNTER — Emergency Department (HOSPITAL_BASED_OUTPATIENT_CLINIC_OR_DEPARTMENT_OTHER): Payer: Medicare Other

## 2021-09-19 ENCOUNTER — Other Ambulatory Visit: Payer: Self-pay

## 2021-09-19 ENCOUNTER — Encounter (HOSPITAL_BASED_OUTPATIENT_CLINIC_OR_DEPARTMENT_OTHER): Payer: Self-pay | Admitting: *Deleted

## 2021-09-19 DIAGNOSIS — Z794 Long term (current) use of insulin: Secondary | ICD-10-CM | POA: Diagnosis not present

## 2021-09-19 DIAGNOSIS — R059 Cough, unspecified: Secondary | ICD-10-CM | POA: Diagnosis present

## 2021-09-19 DIAGNOSIS — Z7982 Long term (current) use of aspirin: Secondary | ICD-10-CM | POA: Diagnosis not present

## 2021-09-19 DIAGNOSIS — J45909 Unspecified asthma, uncomplicated: Secondary | ICD-10-CM | POA: Diagnosis not present

## 2021-09-19 DIAGNOSIS — J441 Chronic obstructive pulmonary disease with (acute) exacerbation: Secondary | ICD-10-CM

## 2021-09-19 DIAGNOSIS — E119 Type 2 diabetes mellitus without complications: Secondary | ICD-10-CM | POA: Insufficient documentation

## 2021-09-19 DIAGNOSIS — Z79899 Other long term (current) drug therapy: Secondary | ICD-10-CM | POA: Insufficient documentation

## 2021-09-19 DIAGNOSIS — J101 Influenza due to other identified influenza virus with other respiratory manifestations: Secondary | ICD-10-CM | POA: Diagnosis not present

## 2021-09-19 DIAGNOSIS — Z7984 Long term (current) use of oral hypoglycemic drugs: Secondary | ICD-10-CM | POA: Insufficient documentation

## 2021-09-19 DIAGNOSIS — Z20822 Contact with and (suspected) exposure to covid-19: Secondary | ICD-10-CM | POA: Diagnosis not present

## 2021-09-19 DIAGNOSIS — Z87891 Personal history of nicotine dependence: Secondary | ICD-10-CM | POA: Diagnosis not present

## 2021-09-19 DIAGNOSIS — Z7951 Long term (current) use of inhaled steroids: Secondary | ICD-10-CM | POA: Diagnosis not present

## 2021-09-19 DIAGNOSIS — I5032 Chronic diastolic (congestive) heart failure: Secondary | ICD-10-CM | POA: Insufficient documentation

## 2021-09-19 LAB — I-STAT VENOUS BLOOD GAS, ED
Acid-Base Excess: 4 mmol/L — ABNORMAL HIGH (ref 0.0–2.0)
Bicarbonate: 31.8 mmol/L — ABNORMAL HIGH (ref 20.0–28.0)
Calcium, Ion: 1.18 mmol/L (ref 1.15–1.40)
HCT: 37 % (ref 36.0–46.0)
Hemoglobin: 12.6 g/dL (ref 12.0–15.0)
O2 Saturation: 34 %
Potassium: 3.5 mmol/L (ref 3.5–5.1)
Sodium: 143 mmol/L (ref 135–145)
TCO2: 34 mmol/L — ABNORMAL HIGH (ref 22–32)
pCO2, Ven: 63.7 mmHg — ABNORMAL HIGH (ref 44.0–60.0)
pH, Ven: 7.306 (ref 7.250–7.430)
pO2, Ven: 23 mmHg — CL (ref 32.0–45.0)

## 2021-09-19 LAB — CBC WITH DIFFERENTIAL/PLATELET
Abs Immature Granulocytes: 0.13 10*3/uL — ABNORMAL HIGH (ref 0.00–0.07)
Basophils Absolute: 0 10*3/uL (ref 0.0–0.1)
Basophils Relative: 0 %
Eosinophils Absolute: 0 10*3/uL (ref 0.0–0.5)
Eosinophils Relative: 0 %
HCT: 38.1 % (ref 36.0–46.0)
Hemoglobin: 11.2 g/dL — ABNORMAL LOW (ref 12.0–15.0)
Immature Granulocytes: 2 %
Lymphocytes Relative: 28 %
Lymphs Abs: 1.7 10*3/uL (ref 0.7–4.0)
MCH: 24.1 pg — ABNORMAL LOW (ref 26.0–34.0)
MCHC: 29.4 g/dL — ABNORMAL LOW (ref 30.0–36.0)
MCV: 82.1 fL (ref 80.0–100.0)
Monocytes Absolute: 0.6 10*3/uL (ref 0.1–1.0)
Monocytes Relative: 10 %
Neutro Abs: 3.6 10*3/uL (ref 1.7–7.7)
Neutrophils Relative %: 60 %
Platelets: 160 10*3/uL (ref 150–400)
RBC: 4.64 MIL/uL (ref 3.87–5.11)
RDW: 17.5 % — ABNORMAL HIGH (ref 11.5–15.5)
WBC: 6.1 10*3/uL (ref 4.0–10.5)
nRBC: 0 % (ref 0.0–0.2)

## 2021-09-19 LAB — BASIC METABOLIC PANEL
Anion gap: 6 (ref 5–15)
BUN: 15 mg/dL (ref 8–23)
CO2: 29 mmol/L (ref 22–32)
Calcium: 8 mg/dL — ABNORMAL LOW (ref 8.9–10.3)
Chloride: 106 mmol/L (ref 98–111)
Creatinine, Ser: 0.76 mg/dL (ref 0.44–1.00)
GFR, Estimated: >60 mL/min (ref >60)
Glucose, Bld: 108 mg/dL — ABNORMAL HIGH (ref 70–99)
Potassium: 3.6 mmol/L (ref 3.5–5.1)
Sodium: 141 mmol/L (ref 135–145)

## 2021-09-19 LAB — RESP PANEL BY RT-PCR (FLU A&B, COVID) ARPGX2
Influenza A by PCR: POSITIVE — AB
Influenza B by PCR: NEGATIVE
SARS Coronavirus 2 by RT PCR: NEGATIVE

## 2021-09-19 MED ORDER — OSELTAMIVIR PHOSPHATE 75 MG PO CAPS: 75.0000 mg | ORAL_CAPSULE | Freq: Two times a day (BID) | ORAL | 0 refills | Status: DC

## 2021-09-19 MED ORDER — PREDNISONE 10 MG PO TABS: 40.0000 mg | ORAL_TABLET | Freq: Every day | ORAL | 0 refills | Status: AC

## 2021-09-19 MED ORDER — IPRATROPIUM-ALBUTEROL 0.5-2.5 (3) MG/3ML IN SOLN
3.0000 mL | Freq: Once | RESPIRATORY_TRACT | Status: AC
  Administered 2021-09-19: 3 mL via RESPIRATORY_TRACT
  Filled 2021-09-19: qty 3

## 2021-09-19 MED ORDER — PREDNISONE 10 MG PO TABS: 40.0000 mg | ORAL_TABLET | Freq: Every day | ORAL | 0 refills | Status: DC

## 2021-09-19 MED ORDER — DOXYCYCLINE HYCLATE 100 MG PO CAPS: 100.0000 mg | ORAL_CAPSULE | Freq: Two times a day (BID) | ORAL | 0 refills | Status: DC

## 2021-09-19 MED ORDER — ALBUTEROL SULFATE (2.5 MG/3ML) 0.083% IN NEBU
2.5000 mg | INHALATION_SOLUTION | Freq: Once | RESPIRATORY_TRACT | Status: AC
  Administered 2021-09-19: 2.5 mg via RESPIRATORY_TRACT
  Filled 2021-09-19: qty 3

## 2021-09-19 MED ORDER — METHYLPREDNISOLONE SODIUM SUCC 125 MG IJ SOLR
125.0000 mg | Freq: Once | INTRAMUSCULAR | Status: AC
  Administered 2021-09-19: 125 mg via INTRAVENOUS
  Filled 2021-09-19: qty 2

## 2021-09-19 MED ORDER — DOXYCYCLINE HYCLATE 100 MG PO CAPS: 100.0000 mg | ORAL_CAPSULE | Freq: Two times a day (BID) | ORAL | 0 refills | Status: AC

## 2021-09-19 MED ORDER — OSELTAMIVIR PHOSPHATE 75 MG PO CAPS: 75.0000 mg | ORAL_CAPSULE | Freq: Two times a day (BID) | ORAL | 0 refills | Status: AC

## 2021-09-19 NOTE — ED Provider Notes (Signed)
[facility] EMERGENCY DEPARTMENT Provider Note   CSN: [id number] Arrival date & time: 09/19/21  0950     History Chief Complaint  Patient presents with   Cough    Kelli Rivera is a 70 y.o. female.   Cough Associated symptoms: shortness of breath and wheezing   Associated symptoms: no chest pain, no chills, no ear pain, no fever, no rash and no sore throat    70 year old female with a history of COPD on 2 L O2 home oxygen presenting to the emergency department with congestion, worsening shortness of breath, generalized fatigue.  The patient states that she was previously in urgent care earlier this week and tested negative for COVID, strep and flu.  She endorses persistent symptoms.  She denies any fevers or chills.  No other aggravating or alleviating factors.  She is overall tolerating oral intake.  Past Medical History:  Diagnosis Date   Asthma    Collagen vascular disease ([place])    COPD (chronic obstructive pulmonary disease) ([place])    O2 increased to 3L while ambulating, 2L at rest as of last week   Diabetes mellitus without complication (HCC)    Feeling of chest tightness 12/2019   High cholesterol    History of leukocytosis    negative bone marrow, followed by heme q57mo   Morbid obesity ([place])     Patient Active Problem List   Diagnosis Date Noted   Streptococcal bacteremia 06/17/2020   SIRS (systemic inflammatory response syndrome) ([place]) 06/16/2020   Agatston coronary artery calcium score less than 100    Type 2 diabetes mellitus without complication, with long-term current use of insulin (HCC)    Chest pain    NSTEMI (non-ST elevated myocardial infarction) ([place]) 01/22/2020   Acute respiratory failure with hypoxia (HCC) 01/01/2019   Chronic diastolic CHF (congestive heart failure) ([place]) 01/01/2019   Mild renal insufficiency 01/01/2019   Chronic dermatitis 07/06/2018   Venous insufficiency of both lower extremities 07/06/2018   Palmar erythema  07/06/2018   RA (rheumatoid arthritis) ([place]) 07/06/2018   DJD (degenerative joint disease) 07/06/2018   OSA (obstructive sleep apnea) 07/06/2018   Eosinophilia 07/06/2018   GERD (gastroesophageal reflux disease) 09/24/2017   Iron deficiency 09/24/2017   Leukocytosis 09/23/2016   Morbid obesity ([place]) 09/23/2016   COPD with acute exacerbation ([place]) 09/23/2016   Hyperlipidemia 09/23/2016   Diabetes mellitus type 2 in obese ([place]) 09/23/2016    Past Surgical History:  Procedure Laterality Date   ABDOMINAL HYSTERECTOMY     APPENDECTOMY     age 64 yrs   bladder tuck     CHOLECYSTECTOMY       OB History     Gravida  2   Para  2   Term      Preterm      AB      Living         SAB      IAB      Ectopic      Multiple      Live Births              Family History  Problem Relation Age of Onset   Heart failure Mother 34   Lung cancer Father 23    Social History   Tobacco Use   Smoking status: Former    Packs/day: 1.00    Years: 42.00    Pack years: 42.00    Types: Cigarettes    Quit date: 2007  Years since quitting: 15.9   Smokeless tobacco: Never  Vaping Use   Vaping Use: Never used  Substance Use Topics   Alcohol use: No   Drug use: No    Home Medications Prior to Admission medications   Medication Sig Start Date End Date Taking? Authorizing Provider  albuterol (PROVENTIL HFA;VENTOLIN HFA) 108 (90 Base) MCG/ACT inhaler Inhale 2 puffs into the lungs every 4 (four) hours as needed for wheezing or shortness of breath. 12/17/15   [provider]  aspirin EC 81 MG tablet Take 81 mg by mouth daily.    [provider]  atorvastatin (LIPITOR) 80 MG tablet TAKE 1 TABLET(80 MG) BY MOUTH DAILY AT 6 PM 05/26/21   Troy Sine, MD  benzonatate (TESSALON) 100 MG capsule Take 1 capsule (100 mg total) by mouth 3 (three) times daily as needed for cough. 06/22/20   Rai, Vernelle Emerald, MD  cephALEXin (KEFLEX) 500 MG capsule Take 1 capsule (500 mg  total) by mouth 3 (three) times daily. 09/11/20   Drenda Freeze, MD  doxycycline (VIBRAMYCIN) 100 MG capsule Take 1 capsule (100 mg total) by mouth 2 (two) times daily for 5 days. 09/19/21 09/24/21  Regan Lemming, MD  ENBREL SURECLICK 50 MG/ML injection Inject 50 mg into the skin once a week. 01/15/20   [provider]  FARXIGA 5 MG TABS tablet Take 5 mg by mouth daily. 06/04/20   [provider]  fluticasone (FLONASE) 50 MCG/ACT nasal spray Place 2 sprays into both nostrils daily as needed for allergies. 10/11/18   [provider]  Fluticasone-Salmeterol (ADVAIR DISKUS) 500-50 MCG/DOSE AEPB Inhale 1 puff into the lungs 2 (two) times daily.     [provider]  furosemide (LASIX) 40 MG tablet Take 1 tablet (40 mg total) by mouth daily. 10/01/17   Ghimire, Henreitta Leber, MD  ipratropium-albuterol (DUONEB) 0.5-2.5 (3) MG/3ML SOLN Take 3 mLs by nebulization 4 (four) times daily. Patient taking differently: Take 3 mLs by nebulization every 6 (six) hours as needed (sob/wheezing).  10/01/17   Ghimire, Henreitta Leber, MD  lidocaine (LIDODERM) 5 % Place 2 patches onto the skin daily. Remove & Discard patch within 12 hours or as directed by MD 06/22/20   Rai, Vernelle Emerald, MD  metFORMIN (GLUCOPHAGE) 1000 MG tablet Take 1,000 mg by mouth daily with breakfast.     [provider]  methocarbamol (ROBAXIN) 500 MG tablet Please take 3 times daily for 7 days, then 3 times daily as needed for muscle spasms/back pain 06/22/20   Rai, Ripudeep K, MD  montelukast (SINGULAIR) 10 MG tablet Take 10 mg by mouth every morning.  08/18/15   [provider]  Multiple Vitamins-Minerals (CENTRUM SILVER 50+WOMEN PO) Take 1 tablet by mouth daily.    [provider]  oseltamivir (TAMIFLU) 75 MG capsule Take 1 capsule (75 mg total) by mouth every 12 (twelve) hours. 09/19/21   Regan Lemming, MD  oxybutynin (DITROPAN XL) 15 MG 24 hr tablet Take 15 mg by mouth daily. 06/01/18   [provider]  pantoprazole (PROTONIX) 20 MG tablet Take 1 tablet (20 mg total) by mouth 2 (two) times daily. 06/06/20   Dorie Rank, MD  polyethylene glycol (MIRALAX / GLYCOLAX) 17 g packet Take 17 g by mouth daily as needed for mild constipation or moderate constipation. Also over the counter 06/22/20   Rai, Ripudeep K, MD  predniSONE (DELTASONE) 10 MG tablet Take 4 tablets (40 mg total) by mouth daily for 3  days. 09/19/21 09/22/21  Ernie Avena, MD  sucralfate (CARAFATE) 1 g tablet Take 1 tablet (1 g total) by mouth 4 (four) times daily -  with meals and at bedtime for 7 days. 06/06/20 06/13/20  Linwood Dibbles, MD  TOUJEO SOLOSTAR 300 UNIT/ML SOPN Inject 20 Units into the skin at bedtime.  08/31/16   [provider]  TRULICITY 1.5 MG/0.5ML SOPN Inject 1.5 mg into the skin every Thursday. 09/19/17   [provider]    Allergies    Liraglutide, Pioglitazone, and Amoxicillin-pot clavulanate  Review of Systems   Review of Systems  Constitutional:  Positive for fatigue. Negative for chills and fever.  HENT:  Negative for ear pain and sore throat.   Eyes:  Negative for pain and visual disturbance.  Respiratory:  Positive for cough, shortness of breath and wheezing.   Cardiovascular:  Negative for chest pain and palpitations.  Gastrointestinal:  Negative for abdominal pain and vomiting.  Genitourinary:  Negative for dysuria and hematuria.  Musculoskeletal:  Negative for arthralgias and back pain.  Skin:  Negative for color change and rash.  Neurological:  Negative for seizures and syncope.  All other systems reviewed and are negative.  Physical Exam Updated Vital Signs BP (!) 122/47   Pulse 75   Temp 98.7 F (37.1 C) (Oral)   Resp 16   Ht 5\' 2"  (1.575 m)   Wt 117.9 kg   SpO2 100%   BMI 47.55 kg/m   Physical Exam Vitals and nursing note reviewed.  Constitutional:      General: She is not in acute distress.    Appearance: She is well-developed.  HENT:     Head:  Normocephalic and atraumatic.  Eyes:     Conjunctiva/sclera: Conjunctivae normal.     Pupils: Pupils are equal, round, and reactive to light.  Cardiovascular:     Rate and Rhythm: Normal rate and regular rhythm.     Heart sounds: No murmur heard. Pulmonary:     Effort: Pulmonary effort is normal. No respiratory distress.     Breath sounds: Wheezing present.     Comments: Diffuse expiratory wheezing in all lung fields. Abdominal:     General: There is no distension.     Palpations: Abdomen is soft.     Tenderness: There is no abdominal tenderness. There is no guarding.  Musculoskeletal:        General: No swelling, deformity or signs of injury.     Cervical back: Neck supple.  Skin:    General: Skin is warm and dry.     Capillary Refill: Capillary refill takes less than 2 seconds.     Findings: No lesion or rash.  Neurological:     General: No focal deficit present.     Mental Status: She is alert. Mental status is at baseline.  Psychiatric:        Mood and Affect: Mood normal.    ED Results / Procedures / Treatments   Labs (all labs ordered are listed, but only abnormal results are displayed) Labs Reviewed  RESP PANEL BY RT-PCR (FLU A&B, COVID) ARPGX2 - Abnormal; Notable for the following components:      Result Value   Influenza A by PCR POSITIVE (*)    All other components within normal limits  CBC WITH DIFFERENTIAL/PLATELET - Abnormal; Notable for the following components:   Hemoglobin 11.2 (*)    MCH 24.1 (*)    MCHC 29.4 (*)    RDW 17.5 (*)  Abs Immature Granulocytes 0.13 (*)    All other components within normal limits  BASIC METABOLIC PANEL - Abnormal; Notable for the following components:   Glucose, Bld 108 (*)    Calcium 8.0 (*)    All other components within normal limits  I-STAT VENOUS BLOOD GAS, ED - Abnormal; Notable for the following components:   pCO2, Ven 63.7 (*)    pO2, Ven 23.0 (*)    Bicarbonate 31.8 (*)    TCO2 34 (*)    Acid-Base Excess 4.0  (*)    All other components within normal limits    EKG None  Radiology DG Chest 2 View  Result Date: 09/19/2021 CLINICAL DATA:  Cough and congestion. EXAM: CHEST - 2 VIEW COMPARISON:  09/11/2020 prior studies FINDINGS: The cardiomediastinal silhouette is unremarkable. Mild chronic peribronchial thickening again noted. There is no evidence of focal airspace disease, pulmonary edema, suspicious pulmonary nodule/mass, pleural effusion, or pneumothorax. No acute bony abnormalities are identified. IMPRESSION: No active cardiopulmonary disease. Electronically Signed   By: Margarette Canada M.D.   On: 09/19/2021 11:09    Procedures Procedures   Medications Ordered in ED Medications  ipratropium-albuterol (DUONEB) 0.5-2.5 (3) MG/3ML nebulizer solution 3 mL (3 mLs Nebulization Given 09/19/21 1025)  albuterol (PROVENTIL) (2.5 MG/3ML) 0.083% nebulizer solution 2.5 mg (2.5 mg Nebulization Given 09/19/21 1025)  methylPREDNISolone sodium succinate (SOLU-MEDROL) 125 mg/2 mL injection 125 mg (125 mg Intravenous Given 09/19/21 1154)  albuterol (PROVENTIL) (2.5 MG/3ML) 0.083% nebulizer solution 2.5 mg (2.5 mg Nebulization Given 09/19/21 1134)  ipratropium-albuterol (DUONEB) 0.5-2.5 (3) MG/3ML nebulizer solution 3 mL (3 mLs Nebulization Given 09/19/21 1134)    ED Course  I have reviewed the triage vital signs and the nursing notes.  Pertinent labs & imaging results that were available during my care of the patient were reviewed by me and considered in my medical decision making (see chart for details).    MDM Rules/Calculators/A&P                           70 year old female with a history of COPD on 2 L O2 home oxygen presenting to the emergency department with congestion, worsening shortness of breath, generalized fatigue.  The patient states that she was previously in urgent care earlier this week and tested negative for COVID, strep and flu.  She endorses persistent symptoms.  She denies any fevers or  chills.  No other aggravating or alleviating factors.  She is overall tolerating oral intake.  On arrival, the patient was afebrile, hemodynamically stable, saturating well on her home 2 L O2 via nasal cannula.  Patient presents with generalized URI symptoms in the setting of her known COPD.  Concern for upper respiratory infection triggering COPD exacerbation.  Will treat with duo nebs and Solu-Medrol and reassess.  Patient symptomatically improved following DuoNeb treatment.  Will administer additional nebulizer and reassess.  COVID-19 and influenza PCR testing collected and resulted positive for influenza A.  We will treat with Tamiflu given the patient's multiple medical comorbidities and age.  Symptomatically much improved and at her baseline following third nebulizer treatment.  Will send course of doxycycline and prednisone to the patient's pharmacy for continued outpatient management.  Provided return precautions and overall feel patient is stable to continue treatment at home.  Final Clinical Impression(s) / ED Diagnoses Final diagnoses:  Influenza A  COPD exacerbation ([place])    Rx / DC Orders ED Discharge Orders  Ordered    oseltamivir (TAMIFLU) 75 MG capsule  Every 12 hours,   Status:  Discontinued        09/19/21 1233    predniSONE (DELTASONE) 10 MG tablet  Daily,   Status:  Discontinued        09/19/21 1234    predniSONE (DELTASONE) 10 MG tablet  Daily,   Status:  Discontinued        09/19/21 1238    doxycycline (VIBRAMYCIN) 100 MG capsule  2 times daily,   Status:  Discontinued        09/19/21 1238    doxycycline (VIBRAMYCIN) 100 MG capsule  2 times daily        09/19/21 1519    oseltamivir (TAMIFLU) 75 MG capsule  Every 12 hours        09/19/21 1519    predniSONE (DELTASONE) 10 MG tablet  Daily        09/19/21 1519             Regan Lemming, MD 09/19/21 1528

## 2021-09-19 NOTE — Discharge Instructions (Signed)
You were evaluated in the Emergency Department and after careful evaluation, we did not find any emergent condition requiring admission or further testing in the hospital.  Your exam/testing today was overall reassuring.  You tested positive today for influenza which likely has triggered a COPD exacerbation.  We will treat with Tamiflu and steroids.  Please return to the Emergency Department if you experience any worsening of your condition.  Thank you for allowing [place] to be a part of your care.

## 2021-09-19 NOTE — ED Triage Notes (Signed)
Arrives to ED on home oxygen, having congestion, onset in the past week, went to urgent care where Covid Test, Strep and Flu test was performed and informed they were negative. Not feeling any better.

## 2021-10-09 ENCOUNTER — Other Ambulatory Visit: Payer: Self-pay

## 2021-10-09 ENCOUNTER — Encounter (HOSPITAL_BASED_OUTPATIENT_CLINIC_OR_DEPARTMENT_OTHER): Payer: Self-pay | Admitting: Emergency Medicine

## 2021-10-09 ENCOUNTER — Emergency Department (HOSPITAL_BASED_OUTPATIENT_CLINIC_OR_DEPARTMENT_OTHER): Payer: Medicare Other

## 2021-10-09 ENCOUNTER — Emergency Department (HOSPITAL_BASED_OUTPATIENT_CLINIC_OR_DEPARTMENT_OTHER)
Admission: EM | Admit: 2021-10-09 | Discharge: 2021-10-09 | Disposition: A | Discharge status: Home / Self Care | Payer: Medicare Other | Attending: Emergency Medicine | Admitting: Emergency Medicine

## 2021-10-09 DIAGNOSIS — Z7982 Long term (current) use of aspirin: Secondary | ICD-10-CM | POA: Insufficient documentation

## 2021-10-09 DIAGNOSIS — Z7984 Long term (current) use of oral hypoglycemic drugs: Secondary | ICD-10-CM | POA: Diagnosis not present

## 2021-10-09 DIAGNOSIS — I5032 Chronic diastolic (congestive) heart failure: Secondary | ICD-10-CM | POA: Insufficient documentation

## 2021-10-09 DIAGNOSIS — Z87891 Personal history of nicotine dependence: Secondary | ICD-10-CM | POA: Insufficient documentation

## 2021-10-09 DIAGNOSIS — R531 Weakness: Secondary | ICD-10-CM

## 2021-10-09 DIAGNOSIS — Z79899 Other long term (current) drug therapy: Secondary | ICD-10-CM | POA: Insufficient documentation

## 2021-10-09 DIAGNOSIS — J45909 Unspecified asthma, uncomplicated: Secondary | ICD-10-CM | POA: Insufficient documentation

## 2021-10-09 DIAGNOSIS — E119 Type 2 diabetes mellitus without complications: Secondary | ICD-10-CM | POA: Insufficient documentation

## 2021-10-09 DIAGNOSIS — R197 Diarrhea, unspecified: Secondary | ICD-10-CM | POA: Diagnosis not present

## 2021-10-09 DIAGNOSIS — R5383 Other fatigue: Secondary | ICD-10-CM | POA: Diagnosis not present

## 2021-10-09 DIAGNOSIS — J111 Influenza due to unidentified influenza virus with other respiratory manifestations: Secondary | ICD-10-CM

## 2021-10-09 DIAGNOSIS — R1013 Epigastric pain: Secondary | ICD-10-CM

## 2021-10-09 DIAGNOSIS — Z7951 Long term (current) use of inhaled steroids: Secondary | ICD-10-CM | POA: Diagnosis not present

## 2021-10-09 DIAGNOSIS — J441 Chronic obstructive pulmonary disease with (acute) exacerbation: Secondary | ICD-10-CM | POA: Insufficient documentation

## 2021-10-09 DIAGNOSIS — D72829 Elevated white blood cell count, unspecified: Secondary | ICD-10-CM | POA: Diagnosis not present

## 2021-10-09 LAB — CBC WITH DIFFERENTIAL/PLATELET
Abs Immature Granulocytes: 0.38 10*3/uL — ABNORMAL HIGH (ref 0.00–0.07)
Basophils Absolute: 0.1 10*3/uL (ref 0.0–0.1)
Basophils Relative: 0 %
Eosinophils Absolute: 2.2 10*3/uL — ABNORMAL HIGH (ref 0.0–0.5)
Eosinophils Relative: 9 %
HCT: 40.6 % (ref 36.0–46.0)
Hemoglobin: 12.2 g/dL (ref 12.0–15.0)
Immature Granulocytes: 2 %
Lymphocytes Relative: 5 %
Lymphs Abs: 1.4 10*3/uL (ref 0.7–4.0)
MCH: 24.6 pg — ABNORMAL LOW (ref 26.0–34.0)
MCHC: 30 g/dL (ref 30.0–36.0)
MCV: 81.9 fL (ref 80.0–100.0)
Monocytes Absolute: 0.4 10*3/uL (ref 0.1–1.0)
Monocytes Relative: 2 %
Neutro Abs: 20.8 10*3/uL — ABNORMAL HIGH (ref 1.7–7.7)
Neutrophils Relative %: 82 %
Platelets: 190 10*3/uL (ref 150–400)
RBC: 4.96 MIL/uL (ref 3.87–5.11)
RDW: 16.6 % — ABNORMAL HIGH (ref 11.5–15.5)
Smear Review: NORMAL
WBC Morphology: ABNORMAL
WBC: 25.2 10*3/uL — ABNORMAL HIGH (ref 4.0–10.5)
nRBC: 0 % (ref 0.0–0.2)

## 2021-10-09 LAB — COMPREHENSIVE METABOLIC PANEL
ALT: 10 U/L (ref 0–44)
AST: 9 U/L — ABNORMAL LOW (ref 15–41)
Albumin: 2.7 g/dL — ABNORMAL LOW (ref 3.5–5.0)
Alkaline Phosphatase: 75 U/L (ref 38–126)
Anion gap: 7 (ref 5–15)
BUN: 22 mg/dL (ref 8–23)
CO2: 27 mmol/L (ref 22–32)
Calcium: 8.2 mg/dL — ABNORMAL LOW (ref 8.9–10.3)
Chloride: 103 mmol/L (ref 98–111)
Creatinine, Ser: 0.9 mg/dL (ref 0.44–1.00)
GFR, Estimated: >60 mL/min (ref >60)
Glucose, Bld: 255 mg/dL — ABNORMAL HIGH (ref 70–99)
Potassium: 4.8 mmol/L (ref 3.5–5.1)
Sodium: 137 mmol/L (ref 135–145)
Total Bilirubin: 0.6 mg/dL (ref 0.3–1.2)
Total Protein: 5.7 g/dL — ABNORMAL LOW (ref 6.5–8.1)

## 2021-10-09 LAB — URINALYSIS, ROUTINE W REFLEX MICROSCOPIC
Bilirubin Urine: NEGATIVE
Glucose, UA: NEGATIVE mg/dL
Hgb urine dipstick: NEGATIVE
Ketones, ur: NEGATIVE mg/dL
Leukocytes,Ua: NEGATIVE
Nitrite: NEGATIVE
Protein, ur: NEGATIVE mg/dL
Specific Gravity, Urine: 1.015 (ref 1.005–1.030)
pH: 5.5 (ref 5.0–8.0)

## 2021-10-09 LAB — LIPASE, BLOOD: Lipase: 28 U/L (ref 11–51)

## 2021-10-09 LAB — MAGNESIUM: Magnesium: 1.8 mg/dL (ref 1.7–2.4)

## 2021-10-09 MED ORDER — SUCRALFATE 1 GM/10ML PO SUSP: 1.0000 g | Freq: Three times a day (TID) | ORAL | 0 refills | Status: DC

## 2021-10-09 MED ORDER — LOPERAMIDE HCL 2 MG PO CAPS: 2.0000 mg | ORAL_CAPSULE | Freq: Four times a day (QID) | ORAL | 0 refills | Status: DC | PRN

## 2021-10-09 MED ORDER — SUCRALFATE 1 GM/10ML PO SUSP: 1.0000 g | Freq: Three times a day (TID) | ORAL | 0 refills | Status: AC

## 2021-10-09 MED ORDER — FAMOTIDINE 20 MG PO TABS: 20.0000 mg | ORAL_TABLET | Freq: Two times a day (BID) | ORAL | 0 refills | Status: DC

## 2021-10-09 MED ORDER — FAMOTIDINE 20 MG PO TABS: 20.0000 mg | ORAL_TABLET | Freq: Two times a day (BID) | ORAL | 0 refills | Status: AC

## 2021-10-09 MED ORDER — IOHEXOL 300 MG/ML  SOLN
100.0000 mL | Freq: Once | INTRAMUSCULAR | Status: AC | PRN
  Administered 2021-10-09: 100 mL via INTRAVENOUS

## 2021-10-09 MED ORDER — SODIUM CHLORIDE 0.9 % IV BOLUS
1000.0000 mL | Freq: Once | INTRAVENOUS | Status: AC
  Administered 2021-10-09: 1000 mL via INTRAVENOUS

## 2021-10-09 MED ORDER — LOPERAMIDE HCL 2 MG PO CAPS: 2.0000 mg | ORAL_CAPSULE | Freq: Four times a day (QID) | ORAL | 0 refills | Status: AC | PRN

## 2021-10-09 NOTE — ED Notes (Signed)
Placed on 3L and patient getting undressed

## 2021-10-09 NOTE — ED Triage Notes (Signed)
Pt is recovering from the flu.  Pt started having diarrhea this week.  Pt feels like she is not improving.  Pt is on home O2.

## 2021-10-09 NOTE — Progress Notes (Signed)
RT assessed patient in triage. Wears home 02, 2-3L. BBS rhonchi. COmpains of productive cough. Will monitor as needed

## 2021-10-09 NOTE — Discharge Instructions (Signed)
Please read and follow all provided instructions.  Your diagnoses today include:  1. Generalized weakness   2. Flu   3. Weakness   4. Diarrhea, unspecified type   5. Epigastric pain     Tests performed today include: Blood cell counts and platelets: White blood cell count was very high Kidney and liver function tests: Were normal Pancreas function test (called lipase): Was normal Urine test to look for infection: No UTI CT of the abdomen and pelvis: No concerning findings or severe infections Chest x-ray: No signs of pneumonia today Vital signs. See below for your results today.   Medications prescribed:  Pepcid (famotidine) - antihistamine  You can find this medication over-the-counter.   DO NOT exceed:  20mg  Pepcid every 12 hours  Carafate - for stomach upset and to protect your stomach  Imodium: Medication for diarrhea  Take any prescribed medications only as directed.  Home care instructions:  Follow any educational materials contained in this packet.  Follow-up instructions: Please follow-up with your primary care provider in the next 3 days for further evaluation of your symptoms.    Return instructions:  SEEK IMMEDIATE MEDICAL ATTENTION IF: The pain does not go away or becomes severe  A temperature above 101F develops  Repeated vomiting occurs (multiple episodes)  The pain becomes localized to portions of the abdomen. The right side could possibly be appendicitis. In an adult, the left lower portion of the abdomen could be colitis or diverticulitis.  Blood is being passed in stools or vomit (bright red or black tarry stools)  You develop chest pain, difficulty breathing, dizziness or fainting, or become confused, poorly responsive, or inconsolable (young children) If you have any other emergent concerns regarding your health  Additional Information: Abdominal (belly) pain can be caused by many things. Your caregiver performed an examination and possibly  ordered blood/urine tests and imaging (CT scan, x-rays, ultrasound). Many cases can be observed and treated at home after initial evaluation in the emergency department. Even though you are being discharged home, abdominal pain can be unpredictable. Therefore, you need a repeated exam if your pain does not resolve, returns, or worsens. Most patients with abdominal pain don't have to be admitted to the hospital or have surgery, but serious problems like appendicitis and gallbladder attacks can start out as nonspecific pain. Many abdominal conditions cannot be diagnosed in one visit, so follow-up evaluations are very important.  Your vital signs today were: BP (!) 139/56    Pulse 89    Temp 98.2 F (36.8 C) (Oral)    Resp 17    Ht 5\' 2"  (1.575 m)    Wt 119.7 kg    SpO2 98%    BMI 48.29 kg/m  If your blood pressure (bp) was elevated above 135/85 this visit, please have this repeated by your doctor within one month. --------------

## 2021-10-09 NOTE — ED Provider Notes (Signed)
[facility] EMERGENCY DEPARTMENT Provider Note   CSN: [id number] Arrival date & time: 10/09/21  [id number]     History Chief Complaint  Patient presents with   Abdominal Pain    Kelli Rivera is a 70 y.o. female.  Patient with history of COPD on home oxygen (currently 3 L during the day, 2 L at night on CPAP), recent flu diagnosis (09/19/2021), presents with generalized fatigue and generalized weakness.  Patient was on a course of antibiotics and steroids for a COPD exacerbation prior to her ED visit on 11/26.  During this visit she was diagnosed with influenza A.  She was given an additional course of doxycycline, prednisone, and Tamiflu.  Patient took this.  She does report some improvement after treatment.  She had close to normal energy for just several days.  Over the past week patient has developed diarrhea.  This is watery, nonbloody.  It occurs every 3 or 4 hours or so.  No focal abdominal pain however she does have epigastric pain when she eats.  No vomiting.  No chest pain or shortness of breath.  No fevers or URI symptoms.  She is not on any blood thinners.  She is prescribed Enbrel weekly for rheumatoid arthritis but has not taken this since mid November when she became sick.  The onset of this condition was acute. The course is constant. Aggravating factors: none. Alleviating factors: none.        Past Medical History:  Diagnosis Date   Asthma    Collagen vascular disease ([place])    COPD (chronic obstructive pulmonary disease) ([place])    O2 increased to 3L while ambulating, 2L at rest as of last week   Diabetes mellitus without complication (HCC)    Feeling of chest tightness 12/2019   High cholesterol    History of leukocytosis    negative bone marrow, followed by heme q38mo   Morbid obesity ([place])     Patient Active Problem List   Diagnosis Date Noted   Streptococcal bacteremia 06/17/2020   SIRS (systemic inflammatory response syndrome) ([place]) 06/16/2020    Agatston coronary artery calcium score less than 100    Type 2 diabetes mellitus without complication, with long-term current use of insulin (HCC)    Chest pain    NSTEMI (non-ST elevated myocardial infarction) ([place]) 01/22/2020   Acute respiratory failure with hypoxia (HCC) 01/01/2019   Chronic diastolic CHF (congestive heart failure) ([place]) 01/01/2019   Mild renal insufficiency 01/01/2019   Chronic dermatitis 07/06/2018   Venous insufficiency of both lower extremities 07/06/2018   Palmar erythema 07/06/2018   RA (rheumatoid arthritis) ([place]) 07/06/2018   DJD (degenerative joint disease) 07/06/2018   OSA (obstructive sleep apnea) 07/06/2018   Eosinophilia 07/06/2018   GERD (gastroesophageal reflux disease) 09/24/2017   Iron deficiency 09/24/2017   Leukocytosis 09/23/2016   Morbid obesity ([place]) 09/23/2016   COPD with acute exacerbation ([place]) 09/23/2016   Hyperlipidemia 09/23/2016   Diabetes mellitus type 2 in obese ([place]) 09/23/2016    Past Surgical History:  Procedure Laterality Date   ABDOMINAL HYSTERECTOMY     APPENDECTOMY     age 17 yrs   bladder tuck     CHOLECYSTECTOMY       OB History     Gravida  2   Para  2   Term      Preterm      AB      Living         SAB  IAB      Ectopic      Multiple      Live Births              Family History  Problem Relation Age of Onset   Heart failure Mother 74   Lung cancer Father 93    Social History   Tobacco Use   Smoking status: Former    Packs/day: 1.00    Years: 42.00    Pack years: 42.00    Types: Cigarettes    Quit date: 2007    Years since quitting: 15.9   Smokeless tobacco: Never  Vaping Use   Vaping Use: Never used  Substance Use Topics   Alcohol use: No   Drug use: No    Home Medications Prior to Admission medications   Medication Sig Start Date End Date Taking? Authorizing Provider  albuterol (PROVENTIL HFA;VENTOLIN HFA) 108 (90 Base) MCG/ACT inhaler Inhale 2 puffs into the  lungs every 4 (four) hours as needed for wheezing or shortness of breath. 12/17/15   [provider]  aspirin EC 81 MG tablet Take 81 mg by mouth daily.    [provider]  atorvastatin (LIPITOR) 80 MG tablet TAKE 1 TABLET(80 MG) BY MOUTH DAILY AT 6 PM 05/26/21   Troy Sine, MD  benzonatate (TESSALON) 100 MG capsule Take 1 capsule (100 mg total) by mouth 3 (three) times daily as needed for cough. 06/22/20   Rai, Vernelle Emerald, MD  cephALEXin (KEFLEX) 500 MG capsule Take 1 capsule (500 mg total) by mouth 3 (three) times daily. 09/11/20   Drenda Freeze, MD  ENBREL SURECLICK 50 MG/ML injection Inject 50 mg into the skin once a week. 01/15/20   [provider]  FARXIGA 5 MG TABS tablet Take 5 mg by mouth daily. 06/04/20   [provider]  fluticasone (FLONASE) 50 MCG/ACT nasal spray Place 2 sprays into both nostrils daily as needed for allergies. 10/11/18   [provider]  Fluticasone-Salmeterol (ADVAIR DISKUS) 500-50 MCG/DOSE AEPB Inhale 1 puff into the lungs 2 (two) times daily.     [provider]  furosemide (LASIX) 40 MG tablet Take 1 tablet (40 mg total) by mouth daily. 10/01/17   Ghimire, Henreitta Leber, MD  ipratropium-albuterol (DUONEB) 0.5-2.5 (3) MG/3ML SOLN Take 3 mLs by nebulization 4 (four) times daily. Patient taking differently: Take 3 mLs by nebulization every 6 (six) hours as needed (sob/wheezing).  10/01/17   Ghimire, Henreitta Leber, MD  lidocaine (LIDODERM) 5 % Place 2 patches onto the skin daily. Remove & Discard patch within 12 hours or as directed by MD 06/22/20   Rai, Vernelle Emerald, MD  metFORMIN (GLUCOPHAGE) 1000 MG tablet Take 1,000 mg by mouth daily with breakfast.     [provider]  methocarbamol (ROBAXIN) 500 MG tablet Please take 3 times daily for 7 days, then 3 times daily as needed for muscle spasms/back pain 06/22/20   Rai, Ripudeep K, MD  montelukast (SINGULAIR) 10 MG tablet Take 10 mg by mouth every morning.  08/18/15    [provider]  Multiple Vitamins-Minerals (CENTRUM SILVER 50+WOMEN PO) Take 1 tablet by mouth daily.    [provider]  oseltamivir (TAMIFLU) 75 MG capsule Take 1 capsule (75 mg total) by mouth every 12 (twelve) hours. 09/19/21   Regan Lemming, MD  oxybutynin (DITROPAN XL) 15 MG 24 hr tablet Take 15 mg by mouth daily. 06/01/18   [provider]  pantoprazole (PROTONIX) 20 MG  tablet Take 1 tablet (20 mg total) by mouth 2 (two) times daily. 06/06/20   Dorie Rank, MD  polyethylene glycol (MIRALAX / GLYCOLAX) 17 g packet Take 17 g by mouth daily as needed for mild constipation or moderate constipation. Also over the counter 06/22/20   Rai, Ripudeep K, MD  sucralfate (CARAFATE) 1 g tablet Take 1 tablet (1 g total) by mouth 4 (four) times daily -  with meals and at bedtime for 7 days. 06/06/20 06/13/20  Dorie Rank, MD  TOUJEO SOLOSTAR 300 UNIT/ML SOPN Inject 20 Units into the skin at bedtime.  08/31/16   [provider]  TRULICITY 1.5 0000000 SOPN Inject 1.5 mg into the skin every Thursday. 09/19/17   [provider]    Allergies    Liraglutide, Pioglitazone, and Amoxicillin-pot clavulanate  Review of Systems   Review of Systems  Constitutional:  Positive for fatigue. Negative for fever.  HENT:  Negative for rhinorrhea and sore throat.   Eyes:  Negative for redness.  Respiratory:  Negative for cough and shortness of breath.   Cardiovascular:  Negative for chest pain.  Gastrointestinal:  Positive for abdominal pain, diarrhea and nausea. Negative for vomiting.  Genitourinary:  Negative for dysuria, frequency, hematuria and urgency.  Musculoskeletal:  Negative for myalgias.  Skin:  Negative for rash.  Neurological:  Positive for weakness. Negative for headaches.   Physical Exam Updated Vital Signs BP (!) 113/56 (BP Location: Right Arm)    Pulse 90    Temp 98.2 F (36.8 C) (Oral)    Resp (!) 22    Ht 5\' 2"  (1.575 m)    Wt 119.7 kg    SpO2 99%    BMI  48.29 kg/m   Physical Exam Vitals and nursing note reviewed.  Constitutional:      General: She is not in acute distress.    Appearance: She is well-developed.  HENT:     Head: Normocephalic and atraumatic.     Right Ear: External ear normal.     Left Ear: External ear normal.     Nose: Nose normal.  Eyes:     Conjunctiva/sclera: Conjunctivae normal.  Cardiovascular:     Rate and Rhythm: Normal rate and regular rhythm.     Heart sounds: No murmur heard. Pulmonary:     Effort: No respiratory distress.     Breath sounds: No wheezing, rhonchi or rales.  Abdominal:     Palpations: Abdomen is soft.     Tenderness: There is no abdominal tenderness. There is no guarding or rebound. Negative signs include Murphy's sign and McBurney's sign.     Comments: No significant abdominal tenderness at time of my exam.  Musculoskeletal:     Cervical back: Normal range of motion and neck supple.     Right lower leg: No edema.     Left lower leg: No edema.  Skin:    General: Skin is warm and dry.     Findings: No rash.  Neurological:     General: No focal deficit present.     Mental Status: She is alert. Mental status is at baseline.     Motor: No weakness.  Psychiatric:        Mood and Affect: Mood normal.    ED Results / Procedures / Treatments   Labs (all labs ordered are listed, but only abnormal results are displayed) Labs Reviewed  COMPREHENSIVE METABOLIC PANEL - Abnormal; Notable for the following components:      Result Value  Glucose, Bld 255 (*)    Calcium 8.2 (*)    Total Protein 5.7 (*)    Albumin 2.7 (*)    AST 9 (*)    All other components within normal limits  CBC WITH DIFFERENTIAL/PLATELET - Abnormal; Notable for the following components:   WBC 25.2 (*)    MCH 24.6 (*)    RDW 16.6 (*)    Neutro Abs 20.8 (*)    Eosinophils Absolute 2.2 (*)    Abs Immature Granulocytes 0.38 (*)    All other components within normal limits  MAGNESIUM  URINALYSIS, ROUTINE W REFLEX  MICROSCOPIC  LIPASE, BLOOD  PATHOLOGIST SMEAR REVIEW    EKG EKG Interpretation  Date/Time:  Friday October 09 2021 10:43:29 EST Ventricular Rate:  82 PR Interval:  161 QRS Duration: 91 QT Interval:  379 QTC Calculation: 443 R Axis:   -33 Text Interpretation: Sinus rhythm Ventricular premature complex Left axis deviation Low voltage, precordial leads Consider anterior infarct Confirmed by Margarita Grizzle [phone]) on 10/09/2021 10:47:27 AM  Radiology CT ABDOMEN PELVIS W CONTRAST  Result Date: 10/09/2021 CLINICAL DATA:  Diarrhea.  Recent flu. EXAM: CT ABDOMEN AND PELVIS WITH CONTRAST TECHNIQUE: Multidetector CT imaging of the abdomen and pelvis was performed using the standard protocol following bolus administration of intravenous contrast. CONTRAST:  OMNIPAQUE IOHEXOL 300 MG/ML  SOLN COMPARISON:  CT abdomen and pelvis dated June 16, 2020. FINDINGS: Lower chest: No acute abnormality. Left lower lobe subsegmental atelectasis. Hepatobiliary: No focal liver abnormality is seen. Status post cholecystectomy. No biliary dilatation. Pancreas: Unremarkable. No pancreatic ductal dilatation or surrounding inflammatory changes. Spleen: Normal in size without focal abnormality. Adrenals/Urinary Tract: The adrenal glands are unremarkable. Unchanged subcentimeter low-density lesions in both kidneys, too small to characterize, but likely cysts. No renal calculi or hydronephrosis. The bladder is unremarkable. Stomach/Bowel: Stomach is within normal limits. History of prior appendectomy. No evidence of bowel wall thickening, distention, or inflammatory changes. Vascular/Lymphatic: No significant vascular findings are present. No enlarged abdominal or pelvic lymph nodes. Reproductive: Status post hysterectomy. Unchanged 2.3 cm simple cyst in the left ovary. No follow-up imaging recommended. Other: Prior ventral hernia repair. Unchanged small to moderate fat containing left lumbar hernia. No free fluid or  pneumoperitoneum. Musculoskeletal: No acute or significant osseous findings. IMPRESSION: 1. No acute intra-abdominal process. 2. Unchanged small to moderate fat containing left lumbar hernia. Electronically Signed   By: Obie Dredge M.D.   On: 10/09/2021 13:50   DG Chest Port 1 View  Result Date: 10/09/2021 CLINICAL DATA:  Cough, rhonchi EXAM: PORTABLE CHEST 1 VIEW COMPARISON:  Chest radiograph 09/19/2021 FINDINGS: The cardiomediastinal silhouette is stable. There is no focal consolidation or pulmonary edema. There is no pleural effusion or pneumothorax. Overall, aeration is unchanged since 09/19/2021. There is no acute osseous abnormality. IMPRESSION: Stable chest with no radiographic evidence of acute cardiopulmonary process. Electronically Signed   By: Lesia Hausen M.D.   On: 10/09/2021 11:13    Procedures Procedures   Medications Ordered in ED Medications - No data to display  ED Course  I have reviewed the triage vital signs and the nursing notes.  Pertinent labs & imaging results that were available during my care of the patient were reviewed by me and considered in my medical decision making (see chart for details).  Patient seen and examined. Plan discussed with patient.   Labs: CBC, CMP, magnesium, EKG  Imaging: Chest x-ray  Medications/Fluids: None at this time  Vital signs reviewed and are as  follows: BP (!) 113/56 (BP Location: Right Arm)    Pulse 90    Temp 98.2 F (36.8 C) (Oral)    Resp (!) 22    Ht 5\' 2"  (1.575 m)    Wt 119.7 kg    SpO2 99%    BMI 48.29 kg/m   Initial impression: Overall, patient appears deconditioned but in no acute distress.  She does not appear to be toxic.  Abdominal exam is reassuring.  Will check work-up as above and determine if additional imaging is needed.  Patient does not really report a foul odor to her stool and I have lower concern for C. difficile at this time --despite recent antibiotic use.  Also no reported fevers or significant  abdominal pain.  10:51 AM EKG reviewed personally. No appreciable change from 06/18/20.   10:59 AM Asked RN to give 500cc NS instead of full 1000cc bolus. Pt c/o SOB to RN, pt with h/o CHF.   White blood cell count elevated.  We will proceed with CT imaging of the abdomen pelvis to rule out infection as cause.  CT scan without acute findings.  Patient updated.  Awaiting urine.  Patient did urinate but was unable to collect a sample.  Will give remainder of 1 L bolus.  Patient looks well.  4:56 PM On re-exam, prior to discharge, patient appears comfortable.  Discussed pertinent results with patient and family at bedside. This included negative CT imaging. They are comfortable with discharge at this time.   Home treatment: Prescription written for Pepcid, Carafate, Imodium.  Follow-up: Encouraged patient to follow-up with their provider if symptoms persist for 3 days. The patient was urged to return to the Emergency Department immediately with worsening of current symptoms, worsening abdominal pain, persistent vomiting, blood noted in stools, fever, or any other concerns. The patient verbalized understanding.   Patient verbalized understanding and agreed with plan.       MDM Rules/Calculators/A&P                          Patient with generalized weakness after flu.  It appears that she likely has recovered from this.  She was on antibiotics and steroids.  Currently she has vague upper abdominal pain with weakness, recurrent bouts of diarrhea.  Does not really sound like C. difficile.  CT imaging of the abdomen pelvis does not demonstrate colitis or enteritis.  Patient is afebrile.  She looks well, nontoxic.  At this point, she has not had any episodes of diarrhea while in the emergency department.  Her symptoms are relatively controlled.  No indications for admission today.  Will continue to treat symptomatically.  Return instructions as above.       Final Clinical Impression(s) / ED  Diagnoses Final diagnoses:  Generalized weakness  Diarrhea, unspecified type  Epigastric pain    Rx / DC Orders ED Discharge Orders          Ordered    famotidine (PEPCID) 20 MG tablet  2 times daily,   Status:  Discontinued        10/09/21 1642    loperamide (IMODIUM) 2 MG capsule  4 times daily PRN,   Status:  Discontinued        10/09/21 1642    sucralfate (CARAFATE) 1 GM/10ML suspension  3 times daily with meals & bedtime,   Status:  Discontinued        10/09/21 1643    famotidine (PEPCID) 20 MG  tablet  2 times daily        10/09/21 1652    loperamide (IMODIUM) 2 MG capsule  4 times daily PRN        10/09/21 1652    sucralfate (CARAFATE) 1 GM/10ML suspension  3 times daily with meals & bedtime        10/09/21 1652             Carlisle Cater, PA-C 10/09/21 1700    Pattricia Boss, MD 10/12/21 [phone]

## 2021-10-11 ENCOUNTER — Other Ambulatory Visit: Payer: Self-pay | Admitting: Cardiovascular Disease

## 2021-10-13 LAB — PATHOLOGIST SMEAR REVIEW

## 2021-11-23 ENCOUNTER — Other Ambulatory Visit: Payer: Self-pay | Admitting: Cardiovascular Disease

## 2021-12-20 ENCOUNTER — Other Ambulatory Visit: Payer: Self-pay | Admitting: Cardiovascular Disease

## 2021-12-21 ENCOUNTER — Other Ambulatory Visit: Payer: Self-pay | Admitting: Cardiovascular Disease

## 2021-12-24 ENCOUNTER — Other Ambulatory Visit: Payer: Self-pay

## 2021-12-24 IMAGING — CT CT ABD-PELV W/ CM
2 of 5 series · 16 of 46 positions shown, 18 images · IV contrast (Omnipaque)
Comparison: CT abdomen and pelvis dated June 16, 2020.

CLINICAL DATA: Diarrhea.  Recent flu.

EXAM:
CT ABDOMEN AND PELVIS WITH CONTRAST
TECHNIQUE: Multidetector CT imaging of the abdomen and pelvis was performed
using the standard protocol following bolus administration of
intravenous contrast.
CONTRAST:  100mL OMNIPAQUE IOHEXOL 300 MG/ML  SOLN

[Series 2: axial st · axial · 0.98mm/px · z∈[-385,-15]mm · 13 of 84 slices shown, 15 images]
[im 5/84  soft-tissue]
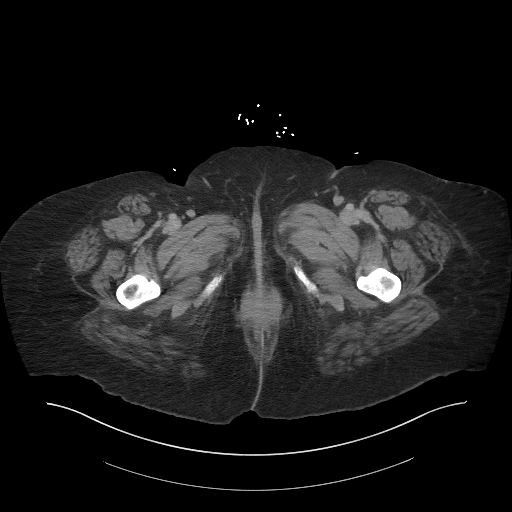
[im 5/84  bone]
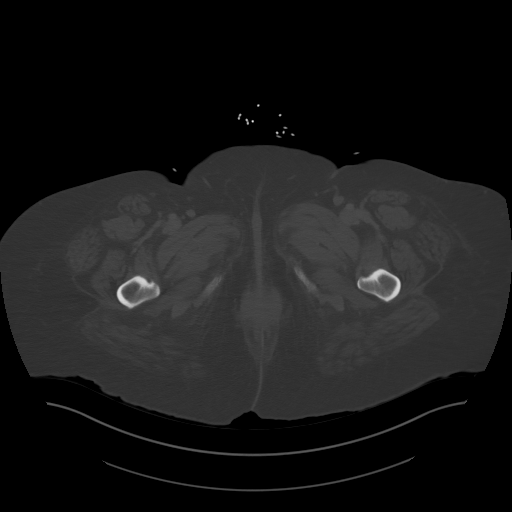
[im 14/84  soft-tissue]
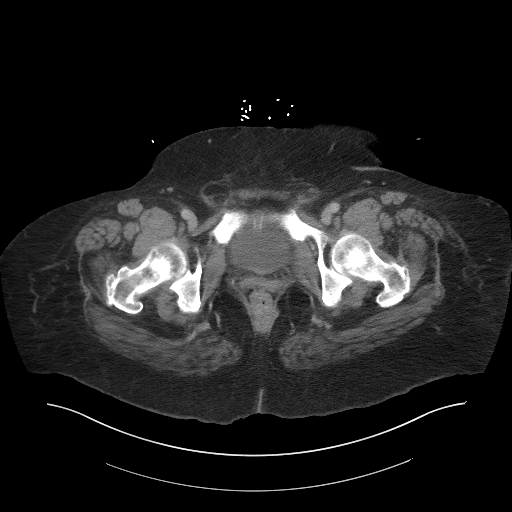
[im 18/84  soft-tissue]
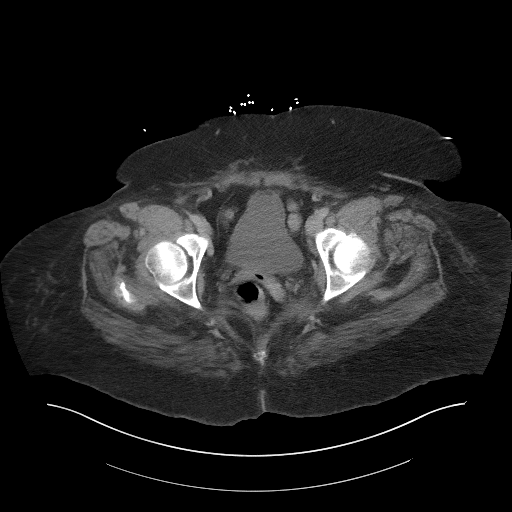
[im 22/84  soft-tissue]
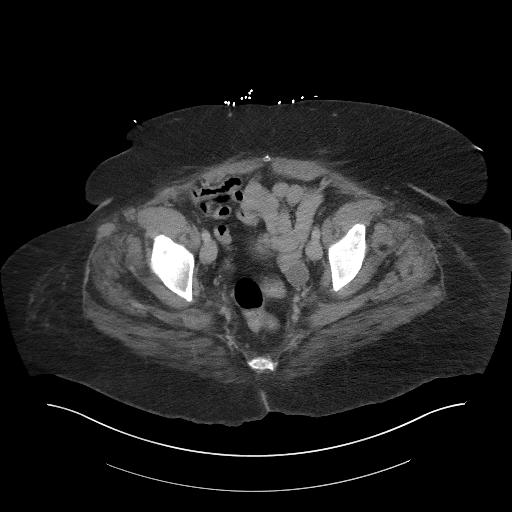
[im 31/84  soft-tissue]
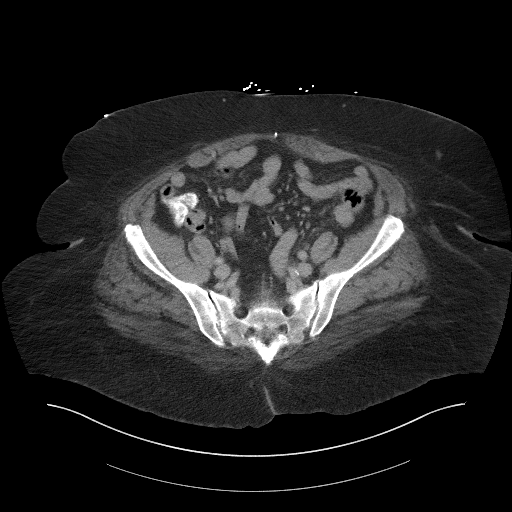
[im 35/84  soft-tissue]
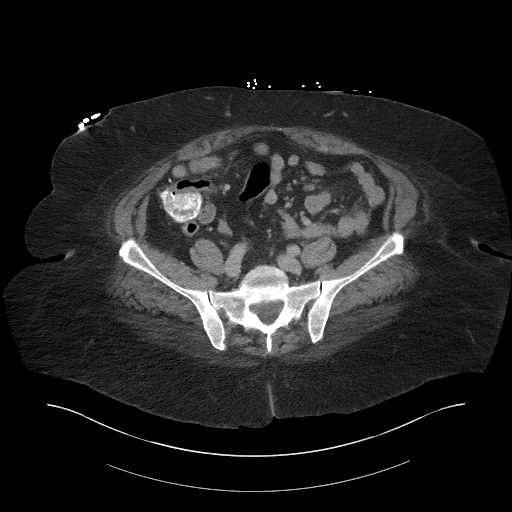
[im 44/84  soft-tissue]
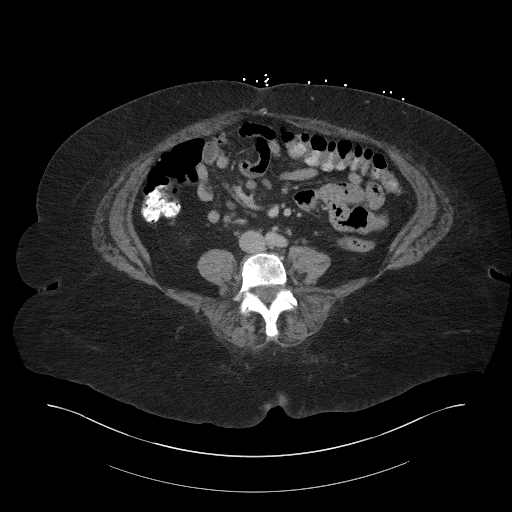
[im 49/84  soft-tissue]
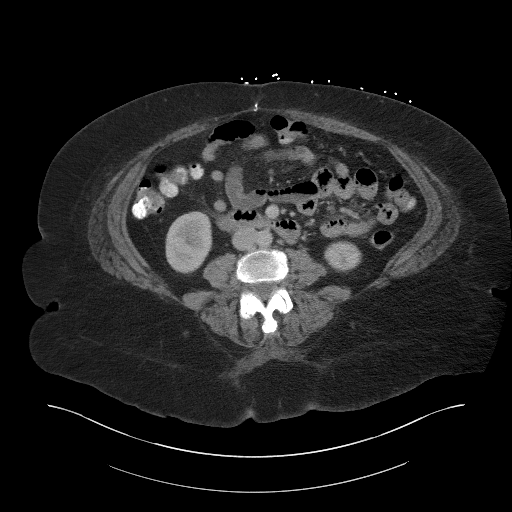
[im 53/84  soft-tissue]
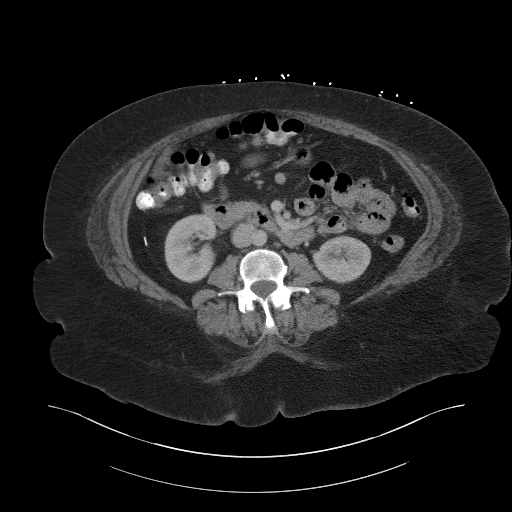
[im 53/84  bone]
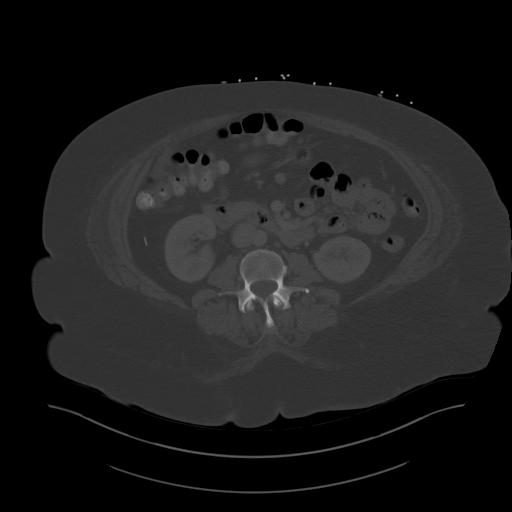
[im 62/84  soft-tissue]
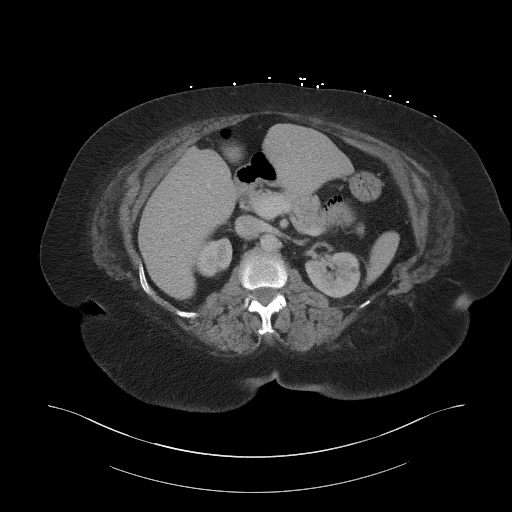
[im 66/84  soft-tissue]
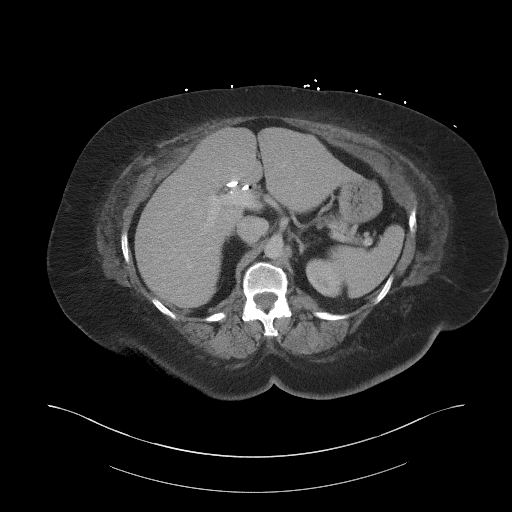
[im 70/84  soft-tissue]
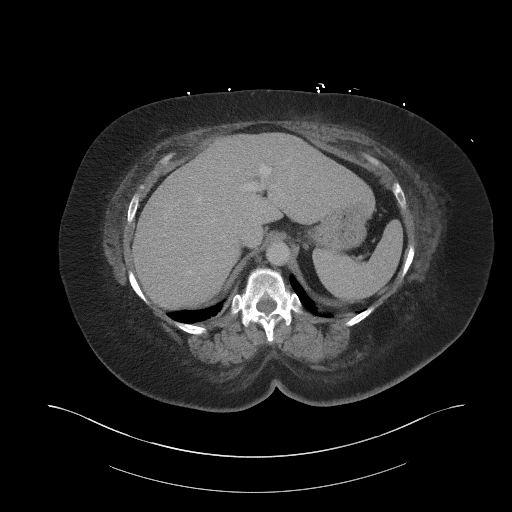
[im 79/84  soft-tissue]
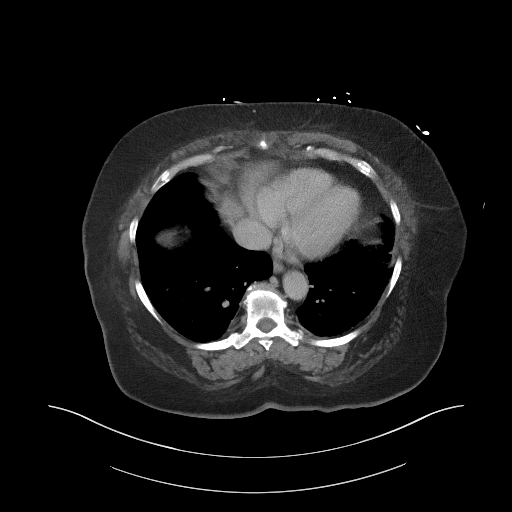

[Series 5: coronal st · coronal · 0.84mm/px · 3 of 119 slices shown]
[im 40/119  soft-tissue]
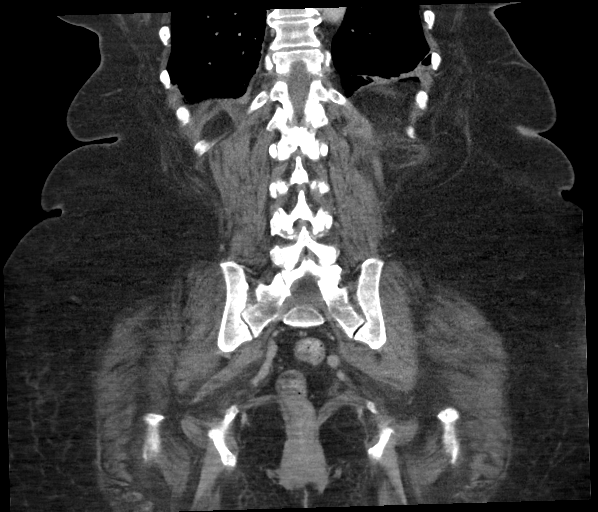
[im 53/119  soft-tissue]
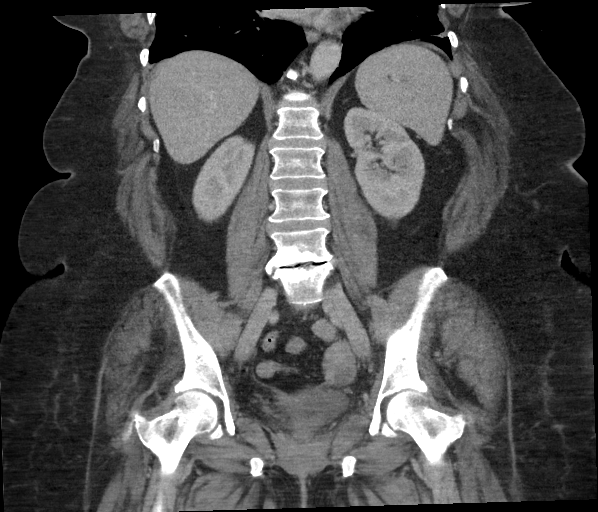
[im 66/119  soft-tissue]
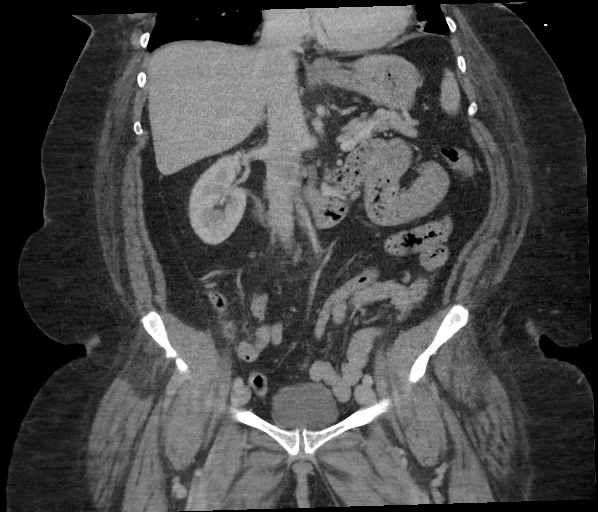

[16 of 46 positions shown; findings below may reference images not displayed]

FINDINGS: Lower chest: No acute abnormality. Left lower lobe subsegmental
atelectasis.

Hepatobiliary: No focal liver abnormality is seen. Status post
cholecystectomy. No biliary dilatation.

Pancreas: Unremarkable. No pancreatic ductal dilatation or
surrounding inflammatory changes.

Spleen: Normal in size without focal abnormality.

Adrenals/Urinary Tract: The adrenal glands are unremarkable.
Unchanged subcentimeter low-density lesions in both kidneys, too
small to characterize, but likely cysts. No renal calculi or
hydronephrosis. The bladder is unremarkable.

Stomach/Bowel: Stomach is within normal limits. History of prior
appendectomy. No evidence of bowel wall thickening, distention, or
inflammatory changes.

Vascular/Lymphatic: No significant vascular findings are present. No
enlarged abdominal or pelvic lymph nodes.

Reproductive: Status post hysterectomy. Unchanged 2.3 cm simple cyst
in the left ovary. No follow-up imaging recommended.

Other: Prior ventral hernia repair. Unchanged small to moderate fat
containing left lumbar hernia. No free fluid or pneumoperitoneum.

Musculoskeletal: No acute or significant osseous findings.
IMPRESSION: 1. No acute intra-abdominal process.
2. Unchanged small to moderate fat containing left lumbar hernia.

## 2022-03-21 ENCOUNTER — Other Ambulatory Visit: Payer: Self-pay | Admitting: Cardiovascular Disease

## 2023-09-30 ENCOUNTER — Encounter (HOSPITAL_BASED_OUTPATIENT_CLINIC_OR_DEPARTMENT_OTHER): Payer: Self-pay

## 2023-09-30 ENCOUNTER — Emergency Department (HOSPITAL_BASED_OUTPATIENT_CLINIC_OR_DEPARTMENT_OTHER): Payer: 59

## 2023-09-30 ENCOUNTER — Emergency Department (HOSPITAL_BASED_OUTPATIENT_CLINIC_OR_DEPARTMENT_OTHER)
Admission: EM | Admit: 2023-09-30 | Discharge: 2023-09-30 | Disposition: A | Discharge status: Home / Self Care | Payer: 59 | Attending: Emergency Medicine | Admitting: Emergency Medicine

## 2023-09-30 ENCOUNTER — Other Ambulatory Visit: Payer: Self-pay

## 2023-09-30 DIAGNOSIS — J449 Chronic obstructive pulmonary disease, unspecified: Secondary | ICD-10-CM | POA: Diagnosis not present

## 2023-09-30 DIAGNOSIS — Z7984 Long term (current) use of oral hypoglycemic drugs: Secondary | ICD-10-CM | POA: Diagnosis not present

## 2023-09-30 DIAGNOSIS — E119 Type 2 diabetes mellitus without complications: Secondary | ICD-10-CM | POA: Diagnosis not present

## 2023-09-30 DIAGNOSIS — Z7982 Long term (current) use of aspirin: Secondary | ICD-10-CM | POA: Diagnosis not present

## 2023-09-30 DIAGNOSIS — Z7951 Long term (current) use of inhaled steroids: Secondary | ICD-10-CM | POA: Diagnosis not present

## 2023-09-30 DIAGNOSIS — Z794 Long term (current) use of insulin: Secondary | ICD-10-CM | POA: Insufficient documentation

## 2023-09-30 DIAGNOSIS — R1084 Generalized abdominal pain: Secondary | ICD-10-CM | POA: Diagnosis not present

## 2023-09-30 DIAGNOSIS — R109 Unspecified abdominal pain: Secondary | ICD-10-CM | POA: Diagnosis present

## 2023-09-30 LAB — CBC WITH DIFFERENTIAL/PLATELET
Abs Immature Granulocytes: 0.24 10*3/uL — ABNORMAL HIGH (ref 0.00–0.07)
Basophils Absolute: 0.1 10*3/uL (ref 0.0–0.1)
Basophils Relative: 0 %
Eosinophils Absolute: 2.8 10*3/uL — ABNORMAL HIGH (ref 0.0–0.5)
Eosinophils Relative: 12 %
HCT: 38.8 % (ref 36.0–46.0)
Hemoglobin: 11.6 g/dL — ABNORMAL LOW (ref 12.0–15.0)
Immature Granulocytes: 1 %
Lymphocytes Relative: 6 %
Lymphs Abs: 1.4 10*3/uL (ref 0.7–4.0)
MCH: 24.4 pg — ABNORMAL LOW (ref 26.0–34.0)
MCHC: 29.9 g/dL — ABNORMAL LOW (ref 30.0–36.0)
MCV: 81.5 fL (ref 80.0–100.0)
Monocytes Absolute: 0.8 10*3/uL (ref 0.1–1.0)
Monocytes Relative: 3 %
Neutro Abs: 18.5 10*3/uL — ABNORMAL HIGH (ref 1.7–7.7)
Neutrophils Relative %: 78 %
Platelets: 192 10*3/uL (ref 150–400)
RBC: 4.76 MIL/uL (ref 3.87–5.11)
RDW: 17.1 % — ABNORMAL HIGH (ref 11.5–15.5)
WBC Morphology: ABNORMAL
WBC: 23.7 10*3/uL — ABNORMAL HIGH (ref 4.0–10.5)
nRBC: 0 % (ref 0.0–0.2)

## 2023-09-30 LAB — COMPREHENSIVE METABOLIC PANEL
ALT: 14 U/L (ref 0–44)
AST: 14 U/L — ABNORMAL LOW (ref 15–41)
Albumin: 2.8 g/dL — ABNORMAL LOW (ref 3.5–5.0)
Alkaline Phosphatase: 83 U/L (ref 38–126)
Anion gap: 8 (ref 5–15)
BUN: 18 mg/dL (ref 8–23)
CO2: 28 mmol/L (ref 22–32)
Calcium: 8.5 mg/dL — ABNORMAL LOW (ref 8.9–10.3)
Chloride: 100 mmol/L (ref 98–111)
Creatinine, Ser: 0.96 mg/dL (ref 0.44–1.00)
GFR, Estimated: >60 mL/min (ref >60)
Glucose, Bld: 249 mg/dL — ABNORMAL HIGH (ref 70–99)
Potassium: 3.9 mmol/L (ref 3.5–5.1)
Sodium: 136 mmol/L (ref 135–145)
Total Bilirubin: 0.9 mg/dL (ref <1.2)
Total Protein: 5.8 g/dL — ABNORMAL LOW (ref 6.5–8.1)

## 2023-09-30 MED ORDER — SODIUM CHLORIDE 0.9 % IV BOLUS
1000.0000 mL | Freq: Once | INTRAVENOUS | Status: AC
  Administered 2023-09-30: 1000 mL via INTRAVENOUS

## 2023-09-30 MED ORDER — ONDANSETRON 4 MG PO TBDP: 4.0000 mg | ORAL_TABLET | Freq: Three times a day (TID) | ORAL | 0 refills | Status: DC | PRN

## 2023-09-30 MED ORDER — IOHEXOL 300 MG/ML  SOLN
100.0000 mL | Freq: Once | INTRAMUSCULAR | Status: AC | PRN
  Administered 2023-09-30: 100 mL via INTRAVENOUS

## 2023-09-30 NOTE — ED Triage Notes (Signed)
The patient is having abd pain and weakness since last night. The dr called and wanted her to be seen due to high white blood cell count and her kidney function. She has had recent oral steroids.

## 2023-09-30 NOTE — ED Provider Notes (Signed)
[place] [facility] Provider Note   CSN: [id number] Arrival date & time: 09/30/23  1756     History  Chief Complaint  Patient presents with   Abnormal Lab   Abdominal Pain    Kelli Rivera is a 72 y.o. female.  Patient here with abdominal pain and weakness since last night.  She went to her doctor today.  Was found to have a high white count told to come for evaluation.  She has a history of diabetes, COPD.  Just finished a course of steroids.  Denies any fevers or chills.  She has had some loose stools and not felt well for a little bit.  Denies any sick contacts.  Nothing makes it worse or better.  The history is provided by the patient.       Home Medications Prior to Admission medications   Medication Sig Start Date End Date Taking? Authorizing Provider  ondansetron (ZOFRAN-ODT) 4 MG disintegrating tablet Take 1 tablet (4 mg total) by mouth every 8 (eight) hours as needed. 09/30/23  Yes Linell Meldrum, DO  albuterol (PROVENTIL HFA;VENTOLIN HFA) 108 (90 Base) MCG/ACT inhaler Inhale 2 puffs into the lungs every 4 (four) hours as needed for wheezing or shortness of breath. 12/17/15   [provider]  aspirin EC 81 MG tablet Take 81 mg by mouth daily.    [provider]  atorvastatin (LIPITOR) 80 MG tablet TAKE 1 TABLET(80 MG) BY MOUTH DAILY AT 6 PM 12/22/21   Lennette Bihari, MD  benzonatate (TESSALON) 100 MG capsule Take 1 capsule (100 mg total) by mouth 3 (three) times daily as needed for cough. 06/22/20   Rai, Delene Ruffini, MD  cephALEXin (KEFLEX) 500 MG capsule Take 1 capsule (500 mg total) by mouth 3 (three) times daily. 09/11/20   Charlynne Pander, MD  ENBREL SURECLICK 50 MG/ML injection Inject 50 mg into the skin once a week. 01/15/20   [provider]  famotidine (PEPCID) 20 MG tablet Take 1 tablet (20 mg total) by mouth 2 (two) times daily. 10/09/21   Renne Crigler, PA-C  FARXIGA 5 MG TABS tablet Take 5 mg by  mouth daily. 06/04/20   [provider]  fluticasone (FLONASE) 50 MCG/ACT nasal spray Place 2 sprays into both nostrils daily as needed for allergies. 10/11/18   [provider]  Fluticasone-Salmeterol (ADVAIR DISKUS) 500-50 MCG/DOSE AEPB Inhale 1 puff into the lungs 2 (two) times daily.     [provider]  furosemide (LASIX) 40 MG tablet Take 1 tablet (40 mg total) by mouth daily. 10/01/17   Ghimire, Werner Lean, MD  ipratropium-albuterol (DUONEB) 0.5-2.5 (3) MG/3ML SOLN Take 3 mLs by nebulization 4 (four) times daily. Patient taking differently: Take 3 mLs by nebulization every 6 (six) hours as needed (sob/wheezing).  10/01/17   Ghimire, Werner Lean, MD  lidocaine (LIDODERM) 5 % Place 2 patches onto the skin daily. Remove & Discard patch within 12 hours or as directed by MD 06/22/20   Rai, Delene Ruffini, MD  loperamide (IMODIUM) 2 MG capsule Take 1 capsule (2 mg total) by mouth 4 (four) times daily as needed for diarrhea or loose stools. 10/09/21   Renne Crigler, PA-C  metFORMIN (GLUCOPHAGE) 1000 MG tablet Take 1,000 mg by mouth daily with breakfast.     [provider]  methocarbamol (ROBAXIN) 500 MG tablet Please take 3 times daily for 7 days, then 3 times daily as needed for muscle spasms/back pain 06/22/20  Rai, Ripudeep K, MD  montelukast (SINGULAIR) 10 MG tablet Take 10 mg by mouth every morning.  08/18/15   [provider]  Multiple Vitamins-Minerals (CENTRUM SILVER 50+WOMEN PO) Take 1 tablet by mouth daily.    [provider]  oseltamivir (TAMIFLU) 75 MG capsule Take 1 capsule (75 mg total) by mouth every 12 (twelve) hours. 09/19/21   Ernie Avena, MD  oxybutynin (DITROPAN XL) 15 MG 24 hr tablet Take 15 mg by mouth daily. 06/01/18   [provider]  pantoprazole (PROTONIX) 20 MG tablet Take 1 tablet (20 mg total) by mouth 2 (two) times daily. 06/06/20   Linwood Dibbles, MD  polyethylene glycol (MIRALAX / GLYCOLAX) 17 g packet Take 17 g by mouth  daily as needed for mild constipation or moderate constipation. Also over the counter 06/22/20   Rai, Ripudeep K, MD  sucralfate (CARAFATE) 1 GM/10ML suspension Take 10 mLs (1 g total) by mouth 4 (four) times daily -  with meals and at bedtime. 10/09/21   Renne Crigler, PA-C  TOUJEO SOLOSTAR 300 UNIT/ML SOPN Inject 20 Units into the skin at bedtime.  08/31/16   [provider]  TRULICITY 1.5 MG/0.5ML SOPN Inject 1.5 mg into the skin every Thursday. 09/19/17   [provider]      Allergies    Liraglutide, Pioglitazone, and Amoxicillin-pot clavulanate    Review of Systems   Review of Systems  Physical Exam Updated Vital Signs BP (!) 105/52 (BP Location: Left Arm)   Pulse (!) 108   Temp 97.9 F (36.6 C) (Oral)   Resp 20   Ht 5\' 2"  (1.575 m)   Wt 117.5 kg   SpO2 92%   BMI 47.37 kg/m  Physical Exam Vitals and nursing note reviewed.  Constitutional:      General: She is not in acute distress.    Appearance: She is well-developed.  HENT:     Head: Normocephalic and atraumatic.  Eyes:     Conjunctiva/sclera: Conjunctivae normal.  Cardiovascular:     Rate and Rhythm: Normal rate and regular rhythm.     Heart sounds: No murmur heard. Pulmonary:     Effort: Pulmonary effort is normal. No respiratory distress.     Breath sounds: Normal breath sounds.  Abdominal:     Palpations: Abdomen is soft.     Tenderness: There is abdominal tenderness.  Musculoskeletal:        General: No swelling.     Cervical back: Neck supple.  Skin:    General: Skin is warm and dry.     Capillary Refill: Capillary refill takes less than 2 seconds.  Neurological:     Mental Status: She is alert.  Psychiatric:        Mood and Affect: Mood normal.     ED Results / Procedures / Treatments   Labs (all labs ordered are listed, but only abnormal results are displayed) Labs Reviewed  COMPREHENSIVE METABOLIC PANEL - Abnormal; Notable for the following components:      Result Value    Glucose, Bld 249 (*)    Calcium 8.5 (*)    Total Protein 5.8 (*)    Albumin 2.8 (*)    AST 14 (*)    All other components within normal limits  CBC WITH DIFFERENTIAL/PLATELET  CBC WITH DIFFERENTIAL/PLATELET    EKG None  Radiology CT ABDOMEN PELVIS W CONTRAST  Result Date: 09/30/2023 CLINICAL DATA:  Abdominal pain and weakness, elevated white blood cell count EXAM: CT ABDOMEN AND  PELVIS WITH CONTRAST TECHNIQUE: Multidetector CT imaging of the abdomen and pelvis was performed using the standard protocol following bolus administration of intravenous contrast. RADIATION DOSE REDUCTION: This exam was performed according to the departmental dose-optimization program which includes automated exposure control, adjustment of the mA and/or kV according to patient size and/or use of iterative reconstruction technique. CONTRAST:  OMNIPAQUE IOHEXOL 300 MG/ML  SOLN COMPARISON:  10/09/2021 FINDINGS: Lower chest: No acute pleural or parenchymal lung disease. Hepatobiliary: No focal liver abnormality is seen. Status post cholecystectomy. No biliary dilatation. Pancreas: Unremarkable. No pancreatic ductal dilatation or surrounding inflammatory changes. Spleen: Normal in size without focal abnormality. Adrenals/Urinary Tract: Adrenals and kidneys are stable. No urinary tract calculi or obstructive uropathy. The bladder is unremarkable. Stomach/Bowel: No bowel obstruction or ileus. Scattered colonic diverticulosis without evidence of acute diverticulitis. No bowel wall thickening or inflammatory change. Prior appendectomy. Vascular/Lymphatic: Aortic atherosclerosis. No enlarged abdominal or pelvic lymph nodes. Reproductive: Status post hysterectomy. No adnexal masses. Other: No free fluid or free intraperitoneal gas. Stable fat containing left lumbar hernia. Prior ventral hernia repair. Musculoskeletal: No acute or destructive bony abnormalities. Severe L4-5 spondylosis unchanged. Reconstructed images demonstrate  no additional findings. IMPRESSION: 1. No acute intra-abdominal or intrapelvic process. 2. Stable fat containing left lumbar hernia. 3.  Aortic Atherosclerosis (ICD10-I70.0). Electronically Signed   By: Sharlet Salina M.D.   On: 09/30/2023 21:12    Procedures Procedures    Medications Ordered in ED Medications  sodium chloride 0.9 % bolus 1,000 mL (0 mLs Intravenous Stopped 09/30/23 2156)  iohexol (OMNIPAQUE) 300 MG/ML solution 100 mL (100 mLs Intravenous Contrast Given 09/30/23 2053)    ED Course/ Medical Decision Making/ A&P                                 Medical Decision Making Amount and/or Complexity of Data Reviewed Labs: ordered. Radiology: ordered.  Risk Prescription drug management.   CHERYSH LUTEN is here with abdominal pain.  History of diabetes COPD.  She has had prior appendectomy and cholecystectomy.  She has been having some diffuse abdominal pain with some diarrhea some nausea for the last 2 days.  She went to go see a provider outpatient and had a white count of 26 and was told to come for evaluation.  Lab work was otherwise unremarkable.  Her kidney function is mildly elevated to 1.1 from a baseline of 0.7.  She is probably little bit dehydrated from the diarrhea and what sounds like may be a colitis or viral illness.  Will get repeat labs and get a CT scan abdomen pelvis to make sure there is no bowel obstruction or diverticulitis or other acute process but I suspect likely a colitis.  Will give IV fluids check labs and get a CT scan and reevaluate.  She is not nauseous at this time.  Overall per review she has a history of leukocytosis and follows with hematology every 4 months.  Seems like she gets leukocytosis with neutrophil predominance at times.  Overall her lab work today is unremarkable.  Kidney functions improved from earlier today.  Leukocytosis likely more of a chronic process in the setting of an acute viral illness which I think is her main issue today.  CT  scan abdomen pelvis was unremarkable per radiology report.  Does not have any pain with urination.  She is feeling much better.  Denies any chest pain shortness of breath or  other acute symptoms.  She will follow-up with her hematologist and primary care doctor outpatient.  Understands return precautions.  Discharged in good condition.  This chart was dictated using voice recognition software.  Despite best efforts to proofread,  errors can occur which can change the documentation meaning.         Final Clinical Impression(s) / ED Diagnoses Final diagnoses:  Generalized abdominal pain    Rx / DC Orders ED Discharge Orders          Ordered    ondansetron (ZOFRAN-ODT) 4 MG disintegrating tablet  Every 8 hours PRN        09/30/23 2215              Virgina Norfolk, DO 09/30/23 2222

## 2023-09-30 NOTE — Discharge Instructions (Addendum)
Continue Zofran as needed for nausea.  Stay well-hydrated.  Follow-up with your primary care doctor.  Might be worth having your white count rechecked again to make sure things are stable.  Overall this seems to be a part of your chronic hematology process however.  Please return if symptoms worsen.

## 2023-09-30 NOTE — ED Notes (Signed)
Attempted ultrasound IV without success. Was able to pull labs. Patient tolerated well.

## 2023-10-03 ENCOUNTER — Emergency Department (HOSPITAL_BASED_OUTPATIENT_CLINIC_OR_DEPARTMENT_OTHER)
Admission: EM | Admit: 2023-10-03 | Discharge: 2023-10-03 | Disposition: A | Discharge status: Home / Self Care | Payer: 59 | Attending: Emergency Medicine | Admitting: Emergency Medicine

## 2023-10-03 ENCOUNTER — Encounter (HOSPITAL_BASED_OUTPATIENT_CLINIC_OR_DEPARTMENT_OTHER): Payer: Self-pay

## 2023-10-03 DIAGNOSIS — Z7982 Long term (current) use of aspirin: Secondary | ICD-10-CM | POA: Diagnosis not present

## 2023-10-03 DIAGNOSIS — Z7951 Long term (current) use of inhaled steroids: Secondary | ICD-10-CM | POA: Insufficient documentation

## 2023-10-03 DIAGNOSIS — K2970 Gastritis, unspecified, without bleeding: Secondary | ICD-10-CM | POA: Diagnosis not present

## 2023-10-03 DIAGNOSIS — J449 Chronic obstructive pulmonary disease, unspecified: Secondary | ICD-10-CM | POA: Insufficient documentation

## 2023-10-03 DIAGNOSIS — R002 Palpitations: Secondary | ICD-10-CM | POA: Diagnosis not present

## 2023-10-03 DIAGNOSIS — Z20822 Contact with and (suspected) exposure to covid-19: Secondary | ICD-10-CM | POA: Insufficient documentation

## 2023-10-03 DIAGNOSIS — R1013 Epigastric pain: Secondary | ICD-10-CM | POA: Diagnosis present

## 2023-10-03 LAB — TROPONIN I (HIGH SENSITIVITY): Troponin I (High Sensitivity): 3 ng/L (ref <18)

## 2023-10-03 LAB — BASIC METABOLIC PANEL
Anion gap: 7 (ref 5–15)
BUN: 33 mg/dL — ABNORMAL HIGH (ref 8–23)
CO2: 26 mmol/L (ref 22–32)
Calcium: 8.2 mg/dL — ABNORMAL LOW (ref 8.9–10.3)
Chloride: 101 mmol/L (ref 98–111)
Creatinine, Ser: 1.21 mg/dL — ABNORMAL HIGH (ref 0.44–1.00)
GFR, Estimated: 47 mL/min — ABNORMAL LOW (ref >60)
Glucose, Bld: 216 mg/dL — ABNORMAL HIGH (ref 70–99)
Potassium: 4.1 mmol/L (ref 3.5–5.1)
Sodium: 134 mmol/L — ABNORMAL LOW (ref 135–145)

## 2023-10-03 LAB — HEPATIC FUNCTION PANEL
ALT: 12 U/L (ref 0–44)
AST: 10 U/L — ABNORMAL LOW (ref 15–41)
Albumin: 2.5 g/dL — ABNORMAL LOW (ref 3.5–5.0)
Alkaline Phosphatase: 76 U/L (ref 38–126)
Bilirubin, Direct: 0.2 mg/dL (ref 0.0–0.2)
Indirect Bilirubin: 0.7 mg/dL (ref 0.3–0.9)
Total Bilirubin: 0.9 mg/dL (ref <1.2)
Total Protein: 5.5 g/dL — ABNORMAL LOW (ref 6.5–8.1)

## 2023-10-03 LAB — RESP PANEL BY RT-PCR (RSV, FLU A&B, COVID)  RVPGX2
Influenza A by PCR: NEGATIVE
Influenza B by PCR: NEGATIVE
Resp Syncytial Virus by PCR: NEGATIVE
SARS Coronavirus 2 by RT PCR: NEGATIVE

## 2023-10-03 LAB — URINALYSIS, ROUTINE W REFLEX MICROSCOPIC
Bilirubin Urine: NEGATIVE
Glucose, UA: NEGATIVE mg/dL
Hgb urine dipstick: NEGATIVE
Ketones, ur: NEGATIVE mg/dL
Leukocytes,Ua: NEGATIVE
Nitrite: NEGATIVE
Protein, ur: NEGATIVE mg/dL
Specific Gravity, Urine: 1.015 (ref 1.005–1.030)
pH: 5.5 (ref 5.0–8.0)

## 2023-10-03 LAB — CBC
HCT: 37.8 % (ref 36.0–46.0)
Hemoglobin: 11.4 g/dL — ABNORMAL LOW (ref 12.0–15.0)
MCH: 24.7 pg — ABNORMAL LOW (ref 26.0–34.0)
MCHC: 30.2 g/dL (ref 30.0–36.0)
MCV: 81.8 fL (ref 80.0–100.0)
Platelets: 185 10*3/uL (ref 150–400)
RBC: 4.62 MIL/uL (ref 3.87–5.11)
RDW: 17.2 % — ABNORMAL HIGH (ref 11.5–15.5)
WBC: 21.2 10*3/uL — ABNORMAL HIGH (ref 4.0–10.5)
nRBC: 0 % (ref 0.0–0.2)

## 2023-10-03 LAB — MAGNESIUM: Magnesium: 2.1 mg/dL (ref 1.7–2.4)

## 2023-10-03 LAB — LIPASE, BLOOD: Lipase: 29 U/L (ref 11–51)

## 2023-10-03 MED ORDER — SUCRALFATE 1 G PO TABS: 1.0000 g | ORAL_TABLET | Freq: Three times a day (TID) | ORAL | 0 refills | Status: AC

## 2023-10-03 MED ORDER — ALUM & MAG HYDROXIDE-SIMETH 200-200-20 MG/5ML PO SUSP
30.0000 mL | Freq: Once | ORAL | Status: AC
  Administered 2023-10-03: 30 mL via ORAL
  Filled 2023-10-03: qty 30

## 2023-10-03 MED ORDER — LIDOCAINE VISCOUS HCL 2 % MT SOLN
15.0000 mL | Freq: Once | OROMUCOSAL | Status: AC
  Administered 2023-10-03: 15 mL via ORAL
  Filled 2023-10-03: qty 15

## 2023-10-03 MED ORDER — FAMOTIDINE 20 MG PO TABS: 20.0000 mg | ORAL_TABLET | Freq: Two times a day (BID) | ORAL | 0 refills | Status: AC

## 2023-10-03 MED ORDER — SODIUM CHLORIDE 0.9 % IV BOLUS
1000.0000 mL | Freq: Once | INTRAVENOUS | Status: AC
  Administered 2023-10-03: 1000 mL via INTRAVENOUS

## 2023-10-03 MED ORDER — FAMOTIDINE IN NACL 20-0.9 MG/50ML-% IV SOLN
20.0000 mg | Freq: Once | INTRAVENOUS | Status: AC
  Administered 2023-10-03: 20 mg via INTRAVENOUS
  Filled 2023-10-03: qty 50

## 2023-10-03 NOTE — ED Notes (Signed)
Pt given gingerrale and cranberry juice for fluid challange

## 2023-10-03 NOTE — ED Triage Notes (Signed)
C/o intermittent heart palpitations since last night. Denies chest pain.   Seen here 3 days ago for abdominal pain & weakness, continues to have symptoms.

## 2023-10-03 NOTE — ED Notes (Signed)
Family at bedside. 

## 2023-10-03 NOTE — ED Notes (Signed)
Pt up to [facility] with assist to obtain urine

## 2023-10-03 NOTE — ED Provider Notes (Signed)
[place] [facility] Provider Note   CSN: [id number] Arrival date & time: 10/03/23  4098     History  Chief Complaint  Patient presents with   Palpitations    Kelli Rivera is a 72 y.o. female with history of COPD on intermittent home oxygen, high cholesterol, presenting to the ED with complaint of feeling unwell and palpitations.  Patient was seen here in the ED 3 days ago with complaint of epigastric abdominal pain and feeling weak.  The symptoms began approximately 5 days ago.  She does she does suffer from severe acid reflux that she is on a PPI for this.  She says she has having pain immediately when she tries to eat or drink, which is epigastric.  She denies nausea or vomiting.  She reports she is moving her bowels regularly without constipation or diarrhea.  She denies dysuria or hematuria but does report she was called and told she had a UTI from urgent care testing last week, and was prescribed Macrobid, which she began taking yesterday.  She has a history of a cholecystectomy, appendectomy, hysterectomy.  She denies any coughing, shortness of breath, fevers.  When in the ED 3 days ago the patient had blood work where she was noted of a chronic leukocytosis, for which she sees a hematologist as an outpatient.  She also had a CT abdomen pelvis which had no emergent findings noted.  Is that she has noted some palpitations since yesterday on and off.  HPI     Home Medications Prior to Admission medications   Medication Sig Start Date End Date Taking? Authorizing Provider  famotidine (PEPCID) 20 MG tablet Take 1 tablet (20 mg total) by mouth 2 (two) times daily. Take 30 minutes before breakfast and dinner 10/03/23 11/02/23 Yes Shilo Philipson, Kermit Balo, MD  sucralfate (CARAFATE) 1 g tablet Take 1 tablet (1 g total) by mouth 4 (four) times daily -  with meals and at bedtime for 60 doses. 10/03/23 10/18/23 Yes Elicia Lui, Kermit Balo, MD  albuterol (PROVENTIL  HFA;VENTOLIN HFA) 108 (90 Base) MCG/ACT inhaler Inhale 2 puffs into the lungs every 4 (four) hours as needed for wheezing or shortness of breath. 12/17/15   [provider]  aspirin EC 81 MG tablet Take 81 mg by mouth daily.    [provider]  atorvastatin (LIPITOR) 80 MG tablet TAKE 1 TABLET(80 MG) BY MOUTH DAILY AT 6 PM 12/22/21   Lennette Bihari, MD  benzonatate (TESSALON) 100 MG capsule Take 1 capsule (100 mg total) by mouth 3 (three) times daily as needed for cough. 06/22/20   Rai, Delene Ruffini, MD  cephALEXin (KEFLEX) 500 MG capsule Take 1 capsule (500 mg total) by mouth 3 (three) times daily. 09/11/20   Charlynne Pander, MD  ENBREL SURECLICK 50 MG/ML injection Inject 50 mg into the skin once a week. 01/15/20   [provider]  famotidine (PEPCID) 20 MG tablet Take 1 tablet (20 mg total) by mouth 2 (two) times daily. 10/09/21   Renne Crigler, PA-C  FARXIGA 5 MG TABS tablet Take 5 mg by mouth daily. 06/04/20   [provider]  fluticasone (FLONASE) 50 MCG/ACT nasal spray Place 2 sprays into both nostrils daily as needed for allergies. 10/11/18   [provider]  Fluticasone-Salmeterol (ADVAIR DISKUS) 500-50 MCG/DOSE AEPB Inhale 1 puff into the lungs 2 (two) times daily.     [provider]  furosemide (LASIX) 40 MG tablet Take 1 tablet (  40 mg total) by mouth daily. 10/01/17   Ghimire, Werner Lean, MD  ipratropium-albuterol (DUONEB) 0.5-2.5 (3) MG/3ML SOLN Take 3 mLs by nebulization 4 (four) times daily. Patient taking differently: Take 3 mLs by nebulization every 6 (six) hours as needed (sob/wheezing).  10/01/17   Ghimire, Werner Lean, MD  lidocaine (LIDODERM) 5 % Place 2 patches onto the skin daily. Remove & Discard patch within 12 hours or as directed by MD 06/22/20   Rai, Delene Ruffini, MD  loperamide (IMODIUM) 2 MG capsule Take 1 capsule (2 mg total) by mouth 4 (four) times daily as needed for diarrhea or loose stools. 10/09/21   Renne Crigler, PA-C   metFORMIN (GLUCOPHAGE) 1000 MG tablet Take 1,000 mg by mouth daily with breakfast.     [provider]  methocarbamol (ROBAXIN) 500 MG tablet Please take 3 times daily for 7 days, then 3 times daily as needed for muscle spasms/back pain 06/22/20   Rai, Ripudeep K, MD  montelukast (SINGULAIR) 10 MG tablet Take 10 mg by mouth every morning.  08/18/15   [provider]  Multiple Vitamins-Minerals (CENTRUM SILVER 50+WOMEN PO) Take 1 tablet by mouth daily.    [provider]  ondansetron (ZOFRAN-ODT) 4 MG disintegrating tablet Take 1 tablet (4 mg total) by mouth every 8 (eight) hours as needed. 09/30/23   Curatolo, Adam, DO  oseltamivir (TAMIFLU) 75 MG capsule Take 1 capsule (75 mg total) by mouth every 12 (twelve) hours. 09/19/21   Ernie Avena, MD  oxybutynin (DITROPAN XL) 15 MG 24 hr tablet Take 15 mg by mouth daily. 06/01/18   [provider]  pantoprazole (PROTONIX) 20 MG tablet Take 1 tablet (20 mg total) by mouth 2 (two) times daily. 06/06/20   Linwood Dibbles, MD  polyethylene glycol (MIRALAX / GLYCOLAX) 17 g packet Take 17 g by mouth daily as needed for mild constipation or moderate constipation. Also over the counter 06/22/20   Rai, Ripudeep K, MD  sucralfate (CARAFATE) 1 GM/10ML suspension Take 10 mLs (1 g total) by mouth 4 (four) times daily -  with meals and at bedtime. 10/09/21   Renne Crigler, PA-C  TOUJEO SOLOSTAR 300 UNIT/ML SOPN Inject 20 Units into the skin at bedtime.  08/31/16   [provider]  TRULICITY 1.5 MG/0.5ML SOPN Inject 1.5 mg into the skin every Thursday. 09/19/17   [provider]      Allergies    Liraglutide, Pioglitazone, and Amoxicillin-pot clavulanate    Review of Systems   Review of Systems  Physical Exam Updated Vital Signs BP (!) 106/51   Pulse 79   Temp 98.6 F (37 C)   Resp 17   Ht 5\' 3"  (1.6 m)   Wt 122 kg   SpO2 100%   BMI 47.65 kg/m  Physical Exam Constitutional:      General: She is not in acute  distress. HENT:     Head: Normocephalic and atraumatic.  Eyes:     Conjunctiva/sclera: Conjunctivae normal.     Pupils: Pupils are equal, round, and reactive to light.  Cardiovascular:     Rate and Rhythm: Normal rate and regular rhythm.  Pulmonary:     Effort: Pulmonary effort is normal. No respiratory distress.  Abdominal:     General: There is no distension.     Tenderness: There is abdominal tenderness in the epigastric area. There is no guarding or rebound.  Skin:    General: Skin is warm and dry.  Neurological:  General: No focal deficit present.     Mental Status: She is alert. Mental status is at baseline.  Psychiatric:        Mood and Affect: Mood normal.        Behavior: Behavior normal.     ED Results / Procedures / Treatments   Labs (all labs ordered are listed, but only abnormal results are displayed) Labs Reviewed  BASIC METABOLIC PANEL - Abnormal; Notable for the following components:      Result Value   Sodium 134 (*)    Glucose, Bld 216 (*)    BUN 33 (*)    Creatinine, Ser 1.21 (*)    Calcium 8.2 (*)    GFR, Estimated 47 (*)    All other components within normal limits  CBC - Abnormal; Notable for the following components:   WBC 21.2 (*)    Hemoglobin 11.4 (*)    MCH 24.7 (*)    RDW 17.2 (*)    All other components within normal limits  HEPATIC FUNCTION PANEL - Abnormal; Notable for the following components:   Total Protein 5.5 (*)    Albumin 2.5 (*)    AST 10 (*)    All other components within normal limits  RESP PANEL BY RT-PCR (RSV, FLU A&B, COVID)  [id number]  URINE CULTURE  LIPASE, BLOOD  MAGNESIUM  URINALYSIS, ROUTINE W REFLEX MICROSCOPIC  TROPONIN I (HIGH SENSITIVITY)    EKG EKG Interpretation Date/Time:  Monday October 03 2023 08:26:36 EST Ventricular Rate:  82 PR Interval:  167 QRS Duration:  111 QT Interval:  362 QTC Calculation: 423 R Axis:   -20  Text Interpretation: Sinus rhythm Paired ventricular premature complexes  Borderline left axis deviation Confirmed by Alvester Chou [id number] on 10/03/2023 8:28:40 AM  Radiology No results found.  Procedures Procedures    Medications Ordered in ED Medications  alum & mag hydroxide-simeth (MAALOX/MYLANTA) 200-200-20 MG/5ML suspension 30 mL (30 mLs Oral Given 10/03/23 0900)    And  lidocaine (XYLOCAINE) 2 % viscous mouth solution 15 mL (15 mLs Oral Given 10/03/23 0900)  famotidine (PEPCID) IVPB 20 mg premix (0 mg Intravenous Stopped 10/03/23 0955)  sodium chloride 0.9 % bolus 1,000 mL (0 mLs Intravenous Stopped 10/03/23 1005)    ED Course/ Medical Decision Making/ A&P Clinical Course as of 10/03/23 1203  Mon Oct 03, 2023  1158 Patient reassessed reports significant improvement of her symptoms with GI medications.  I suspect this likely gastritis.  She was able to tolerate fluid intake.  She is stable at this time for discharge as have not identified any other emergent process to require hospitalization.  Her daughter will be taking her home.  I will prescribe Carafate and Pepcid in addition of the PPI she is already taking, and encouraged her to call and follow-up with her current gastroenterologist.  They verbalized understanding. [MT]  1158 In the meantime they will continue the Macrobid that was prescribed for UTI.  I would like to send a urinalysis and urine culture if possible as this was not done at urgent care - to ensure appropriate antibiotic treatment. Pt will urinate before leaving [MT]    Clinical Course User Index [MT] Ege Muckey, Kermit Balo, MD                                 Medical Decision Making Amount and/or Complexity of Data Reviewed Labs: ordered.  Risk OTC drugs. Prescription drug  management.   This patient presents to the ED with concern for epigastric discomfort, pain after eating, general fatigue. This involves an extensive number of treatment options, and is a complaint that carries with it a high risk of complications and morbidity.   The differential diagnosis includes gastritis versus peptic ulcer versus pancreatitis versus biliary disease versus other.  Of note the patient was also recently begun on treatment yesterday for potential UTI, though she does not have active dysuria or hematuria.  I think it is reasonable to continue with her Macrobid antibiotic which has been prescribed for 7 days.  Co-morbidities that complicate the patient evaluation: History of reported reflux symptoms, patient had endoscopy performed at [facility] in 2023 but I am not able to review the report  Additional history obtained from her daughter at bedside   I ordered and personally interpreted labs.  The pertinent results include: No emergent findings.  There is some minor increase in BUN and creatinine from prior visits to the ED suggestive of perhaps very mild dehydration.  White blood cell count remains within normal limits.  No transaminitis or elevated lipase level.  COVID and flu are negative.  Troponin is negative  The patient was maintained on a cardiac monitor.  I personally viewed and interpreted the cardiac monitored which showed an underlying rhythm of: Sinus rhythm with very sporadic PVC  Per my interpretation the patient's ECG shows sinus rhythm no acute ischemic findings  I ordered medication including GI medications and IV fluids for suspected gastritis and mild dehydration  I have reviewed the patients home medicines and have made adjustments as needed  Test Considered: Low suspicion for AAA, mesenteric ischemia, PE  After the interventions noted above, I reevaluated the patient and found that they have: improved  Dispostion:  After consideration of the diagnostic results and the patients response to treatment, I feel that the patent would benefit from outpatient PCP and GI follow-up.         Final Clinical Impression(s) / ED Diagnoses Final diagnoses:  Gastritis without bleeding, unspecified chronicity,  unspecified gastritis type  Heart palpitations    Rx / DC Orders ED Discharge Orders          Ordered    sucralfate (CARAFATE) 1 g tablet  3 times daily with meals & bedtime        10/03/23 1201    famotidine (PEPCID) 20 MG tablet  2 times daily        10/03/23 1201              Terald Sleeper, MD 10/03/23 1203

## 2023-10-03 NOTE — ED Notes (Signed)
Discharge instructions reviewed with patient. Patient verbalizes understanding, no further questions at this time. Medications/prescriptions and follow up information provided. No acute distress noted at time of departure.  

## 2023-10-03 NOTE — ED Notes (Signed)
 Fall risk armband Fall risk sign Patient wearing shoes

## 2023-10-03 NOTE — ED Notes (Signed)
Patient placed on 2 LNC. Sat was consistently around 89%, and patient just told RN she wears 2L at home, was unaware of this before.

## 2023-10-03 NOTE — Discharge Instructions (Addendum)
Please call to follow-up with your gastroenterologist for your stomach upset.  Continue taking the antibiotic that was prescribed for a urinary tract infection.

## 2023-10-04 LAB — URINE CULTURE: Culture: <10000 — AB

## 2023-10-27 DIAGNOSIS — M0579 Rheumatoid arthritis with rheumatoid factor of multiple sites without organ or systems involvement: Secondary | ICD-10-CM | POA: Diagnosis not present

## 2023-10-27 DIAGNOSIS — M1991 Primary osteoarthritis, unspecified site: Secondary | ICD-10-CM | POA: Diagnosis not present

## 2023-10-27 DIAGNOSIS — R5382 Chronic fatigue, unspecified: Secondary | ICD-10-CM | POA: Diagnosis not present

## 2023-11-17 DIAGNOSIS — I872 Venous insufficiency (chronic) (peripheral): Secondary | ICD-10-CM | POA: Diagnosis not present

## 2023-11-17 DIAGNOSIS — I5022 Chronic systolic (congestive) heart failure: Secondary | ICD-10-CM | POA: Diagnosis not present

## 2023-11-17 DIAGNOSIS — E782 Mixed hyperlipidemia: Secondary | ICD-10-CM | POA: Diagnosis not present

## 2023-11-17 DIAGNOSIS — I2510 Atherosclerotic heart disease of native coronary artery without angina pectoris: Secondary | ICD-10-CM | POA: Diagnosis not present

## 2023-11-24 DIAGNOSIS — M0579 Rheumatoid arthritis with rheumatoid factor of multiple sites without organ or systems involvement: Secondary | ICD-10-CM | POA: Diagnosis not present

## 2023-11-24 DIAGNOSIS — Z1231 Encounter for screening mammogram for malignant neoplasm of breast: Secondary | ICD-10-CM | POA: Diagnosis not present

## 2023-11-24 DIAGNOSIS — R5382 Chronic fatigue, unspecified: Secondary | ICD-10-CM | POA: Diagnosis not present

## 2023-11-24 DIAGNOSIS — M1991 Primary osteoarthritis, unspecified site: Secondary | ICD-10-CM | POA: Diagnosis not present

## 2023-12-08 DIAGNOSIS — E1165 Type 2 diabetes mellitus with hyperglycemia: Secondary | ICD-10-CM | POA: Diagnosis not present

## 2023-12-08 DIAGNOSIS — Z794 Long term (current) use of insulin: Secondary | ICD-10-CM | POA: Diagnosis not present

## 2023-12-08 DIAGNOSIS — J449 Chronic obstructive pulmonary disease, unspecified: Secondary | ICD-10-CM | POA: Diagnosis not present

## 2023-12-08 DIAGNOSIS — I10 Essential (primary) hypertension: Secondary | ICD-10-CM | POA: Diagnosis not present

## 2023-12-08 DIAGNOSIS — I5022 Chronic systolic (congestive) heart failure: Secondary | ICD-10-CM | POA: Diagnosis not present

## 2023-12-08 DIAGNOSIS — E782 Mixed hyperlipidemia: Secondary | ICD-10-CM | POA: Diagnosis not present

## 2023-12-08 DIAGNOSIS — I110 Hypertensive heart disease with heart failure: Secondary | ICD-10-CM | POA: Diagnosis not present

## 2023-12-08 DIAGNOSIS — Z780 Asymptomatic menopausal state: Secondary | ICD-10-CM | POA: Diagnosis not present

## 2023-12-08 DIAGNOSIS — R262 Difficulty in walking, not elsewhere classified: Secondary | ICD-10-CM | POA: Diagnosis not present

## 2023-12-08 DIAGNOSIS — M069 Rheumatoid arthritis, unspecified: Secondary | ICD-10-CM | POA: Diagnosis not present

## 2023-12-20 DIAGNOSIS — K219 Gastro-esophageal reflux disease without esophagitis: Secondary | ICD-10-CM | POA: Diagnosis not present

## 2023-12-20 DIAGNOSIS — R1013 Epigastric pain: Secondary | ICD-10-CM | POA: Diagnosis not present

## 2023-12-20 DIAGNOSIS — K5904 Chronic idiopathic constipation: Secondary | ICD-10-CM | POA: Diagnosis not present

## 2023-12-20 DIAGNOSIS — R1314 Dysphagia, pharyngoesophageal phase: Secondary | ICD-10-CM | POA: Diagnosis not present

## 2023-12-26 DIAGNOSIS — M0579 Rheumatoid arthritis with rheumatoid factor of multiple sites without organ or systems involvement: Secondary | ICD-10-CM | POA: Diagnosis not present

## 2024-01-05 DIAGNOSIS — M1991 Primary osteoarthritis, unspecified site: Secondary | ICD-10-CM | POA: Diagnosis not present

## 2024-01-05 DIAGNOSIS — M0579 Rheumatoid arthritis with rheumatoid factor of multiple sites without organ or systems involvement: Secondary | ICD-10-CM | POA: Diagnosis not present

## 2024-01-05 DIAGNOSIS — R5382 Chronic fatigue, unspecified: Secondary | ICD-10-CM | POA: Diagnosis not present

## 2024-02-06 DIAGNOSIS — M0579 Rheumatoid arthritis with rheumatoid factor of multiple sites without organ or systems involvement: Secondary | ICD-10-CM | POA: Diagnosis not present

## 2024-02-06 DIAGNOSIS — M1991 Primary osteoarthritis, unspecified site: Secondary | ICD-10-CM | POA: Diagnosis not present

## 2024-02-06 DIAGNOSIS — R5382 Chronic fatigue, unspecified: Secondary | ICD-10-CM | POA: Diagnosis not present

## 2024-02-16 DIAGNOSIS — K2100 Gastro-esophageal reflux disease with esophagitis, without bleeding: Secondary | ICD-10-CM | POA: Diagnosis not present

## 2024-02-16 DIAGNOSIS — I5032 Chronic diastolic (congestive) heart failure: Secondary | ICD-10-CM | POA: Diagnosis not present

## 2024-02-16 DIAGNOSIS — I110 Hypertensive heart disease with heart failure: Secondary | ICD-10-CM | POA: Diagnosis not present

## 2024-02-16 DIAGNOSIS — Z7982 Long term (current) use of aspirin: Secondary | ICD-10-CM | POA: Diagnosis not present

## 2024-02-16 DIAGNOSIS — R1013 Epigastric pain: Secondary | ICD-10-CM | POA: Diagnosis not present

## 2024-02-16 DIAGNOSIS — Z7984 Long term (current) use of oral hypoglycemic drugs: Secondary | ICD-10-CM | POA: Diagnosis not present

## 2024-02-16 DIAGNOSIS — Z7985 Long-term (current) use of injectable non-insulin antidiabetic drugs: Secondary | ICD-10-CM | POA: Diagnosis not present

## 2024-02-16 DIAGNOSIS — I872 Venous insufficiency (chronic) (peripheral): Secondary | ICD-10-CM | POA: Diagnosis not present

## 2024-02-16 DIAGNOSIS — R1310 Dysphagia, unspecified: Secondary | ICD-10-CM | POA: Diagnosis not present

## 2024-02-16 DIAGNOSIS — J449 Chronic obstructive pulmonary disease, unspecified: Secondary | ICD-10-CM | POA: Diagnosis not present

## 2024-02-16 DIAGNOSIS — Z7969 Long term (current) use of other immunomodulators and immunosuppressants: Secondary | ICD-10-CM | POA: Diagnosis not present

## 2024-02-16 DIAGNOSIS — R1314 Dysphagia, pharyngoesophageal phase: Secondary | ICD-10-CM | POA: Diagnosis not present

## 2024-02-16 DIAGNOSIS — K219 Gastro-esophageal reflux disease without esophagitis: Secondary | ICD-10-CM | POA: Diagnosis not present

## 2024-02-16 DIAGNOSIS — K222 Esophageal obstruction: Secondary | ICD-10-CM | POA: Diagnosis not present

## 2024-02-16 DIAGNOSIS — K449 Diaphragmatic hernia without obstruction or gangrene: Secondary | ICD-10-CM | POA: Diagnosis not present

## 2024-02-16 DIAGNOSIS — G43709 Chronic migraine without aura, not intractable, without status migrainosus: Secondary | ICD-10-CM | POA: Diagnosis not present

## 2024-02-16 DIAGNOSIS — Z794 Long term (current) use of insulin: Secondary | ICD-10-CM | POA: Diagnosis not present

## 2024-02-16 DIAGNOSIS — G4733 Obstructive sleep apnea (adult) (pediatric): Secondary | ICD-10-CM | POA: Diagnosis not present

## 2024-02-16 DIAGNOSIS — Z87891 Personal history of nicotine dependence: Secondary | ICD-10-CM | POA: Diagnosis not present

## 2024-02-16 DIAGNOSIS — M069 Rheumatoid arthritis, unspecified: Secondary | ICD-10-CM | POA: Diagnosis not present

## 2024-02-16 DIAGNOSIS — I2510 Atherosclerotic heart disease of native coronary artery without angina pectoris: Secondary | ICD-10-CM | POA: Diagnosis not present

## 2024-02-16 DIAGNOSIS — Z79899 Other long term (current) drug therapy: Secondary | ICD-10-CM | POA: Diagnosis not present

## 2024-02-16 DIAGNOSIS — M159 Polyosteoarthritis, unspecified: Secondary | ICD-10-CM | POA: Diagnosis not present

## 2024-02-16 DIAGNOSIS — K2289 Other specified disease of esophagus: Secondary | ICD-10-CM | POA: Diagnosis not present

## 2024-02-16 DIAGNOSIS — E1165 Type 2 diabetes mellitus with hyperglycemia: Secondary | ICD-10-CM | POA: Diagnosis not present

## 2024-03-16 DIAGNOSIS — R21 Rash and other nonspecific skin eruption: Secondary | ICD-10-CM | POA: Diagnosis not present

## 2024-03-16 DIAGNOSIS — R195 Other fecal abnormalities: Secondary | ICD-10-CM | POA: Diagnosis not present

## 2024-03-23 DIAGNOSIS — R609 Edema, unspecified: Secondary | ICD-10-CM | POA: Diagnosis not present

## 2024-03-23 DIAGNOSIS — I872 Venous insufficiency (chronic) (peripheral): Secondary | ICD-10-CM | POA: Diagnosis not present

## 2024-03-28 DIAGNOSIS — J449 Chronic obstructive pulmonary disease, unspecified: Secondary | ICD-10-CM | POA: Diagnosis not present

## 2024-03-28 DIAGNOSIS — E782 Mixed hyperlipidemia: Secondary | ICD-10-CM | POA: Diagnosis not present

## 2024-03-28 DIAGNOSIS — I872 Venous insufficiency (chronic) (peripheral): Secondary | ICD-10-CM | POA: Diagnosis not present

## 2024-03-28 DIAGNOSIS — I2510 Atherosclerotic heart disease of native coronary artery without angina pectoris: Secondary | ICD-10-CM | POA: Diagnosis not present

## 2024-03-28 DIAGNOSIS — E119 Type 2 diabetes mellitus without complications: Secondary | ICD-10-CM | POA: Diagnosis not present

## 2024-05-08 DIAGNOSIS — M1991 Primary osteoarthritis, unspecified site: Secondary | ICD-10-CM | POA: Diagnosis not present

## 2024-05-08 DIAGNOSIS — R5382 Chronic fatigue, unspecified: Secondary | ICD-10-CM | POA: Diagnosis not present

## 2024-05-08 DIAGNOSIS — M0579 Rheumatoid arthritis with rheumatoid factor of multiple sites without organ or systems involvement: Secondary | ICD-10-CM | POA: Diagnosis not present

## 2024-05-09 DIAGNOSIS — L309 Dermatitis, unspecified: Secondary | ICD-10-CM | POA: Diagnosis not present

## 2024-05-09 DIAGNOSIS — I890 Lymphedema, not elsewhere classified: Secondary | ICD-10-CM | POA: Diagnosis not present

## 2024-05-09 DIAGNOSIS — B353 Tinea pedis: Secondary | ICD-10-CM | POA: Diagnosis not present

## 2024-05-09 DIAGNOSIS — I739 Peripheral vascular disease, unspecified: Secondary | ICD-10-CM | POA: Diagnosis not present

## 2024-05-16 ENCOUNTER — Emergency Department (HOSPITAL_BASED_OUTPATIENT_CLINIC_OR_DEPARTMENT_OTHER)
Admission: EM | Admit: 2024-05-16 | Discharge: 2024-05-17 | Disposition: A | Discharge status: Home / Self Care | Attending: Emergency Medicine | Admitting: Emergency Medicine

## 2024-05-16 ENCOUNTER — Emergency Department (HOSPITAL_BASED_OUTPATIENT_CLINIC_OR_DEPARTMENT_OTHER)

## 2024-05-16 ENCOUNTER — Other Ambulatory Visit: Payer: Self-pay

## 2024-05-16 ENCOUNTER — Encounter (HOSPITAL_BASED_OUTPATIENT_CLINIC_OR_DEPARTMENT_OTHER): Payer: Self-pay | Admitting: Emergency Medicine

## 2024-05-16 DIAGNOSIS — N390 Urinary tract infection, site not specified: Secondary | ICD-10-CM | POA: Diagnosis not present

## 2024-05-16 DIAGNOSIS — Z7984 Long term (current) use of oral hypoglycemic drugs: Secondary | ICD-10-CM | POA: Diagnosis not present

## 2024-05-16 DIAGNOSIS — I700 Atherosclerosis of aorta: Secondary | ICD-10-CM | POA: Diagnosis not present

## 2024-05-16 DIAGNOSIS — R600 Localized edema: Secondary | ICD-10-CM | POA: Diagnosis not present

## 2024-05-16 DIAGNOSIS — Z7982 Long term (current) use of aspirin: Secondary | ICD-10-CM | POA: Diagnosis not present

## 2024-05-16 DIAGNOSIS — J449 Chronic obstructive pulmonary disease, unspecified: Secondary | ICD-10-CM | POA: Insufficient documentation

## 2024-05-16 DIAGNOSIS — Z7951 Long term (current) use of inhaled steroids: Secondary | ICD-10-CM | POA: Insufficient documentation

## 2024-05-16 DIAGNOSIS — E119 Type 2 diabetes mellitus without complications: Secondary | ICD-10-CM | POA: Diagnosis not present

## 2024-05-16 DIAGNOSIS — I509 Heart failure, unspecified: Secondary | ICD-10-CM | POA: Diagnosis not present

## 2024-05-16 DIAGNOSIS — M545 Low back pain, unspecified: Secondary | ICD-10-CM | POA: Diagnosis present

## 2024-05-16 DIAGNOSIS — R0602 Shortness of breath: Secondary | ICD-10-CM | POA: Diagnosis not present

## 2024-05-16 LAB — COMPREHENSIVE METABOLIC PANEL WITH GFR
ALT: 6 U/L (ref 0–44)
AST: 16 U/L (ref 15–41)
Albumin: 3.5 g/dL (ref 3.5–5.0)
Alkaline Phosphatase: 81 U/L (ref 38–126)
Anion gap: 9 (ref 5–15)
BUN: 11 mg/dL (ref 8–23)
CO2: 28 mmol/L (ref 22–32)
Calcium: 8.9 mg/dL (ref 8.9–10.3)
Chloride: 103 mmol/L (ref 98–111)
Creatinine, Ser: 0.58 mg/dL (ref 0.44–1.00)
GFR, Estimated: >60 mL/min (ref >60)
Glucose, Bld: 123 mg/dL — ABNORMAL HIGH (ref 70–99)
Potassium: 3.9 mmol/L (ref 3.5–5.1)
Sodium: 140 mmol/L (ref 135–145)
Total Bilirubin: 0.7 mg/dL (ref 0.0–1.2)
Total Protein: 6.6 g/dL (ref 6.5–8.1)

## 2024-05-16 LAB — URINALYSIS, ROUTINE W REFLEX MICROSCOPIC
Bilirubin Urine: NEGATIVE
Glucose, UA: NEGATIVE mg/dL
Hgb urine dipstick: NEGATIVE
Ketones, ur: NEGATIVE mg/dL
Nitrite: POSITIVE — AB
Protein, ur: 100 mg/dL — AB
Specific Gravity, Urine: 1.02 (ref 1.005–1.030)
pH: 7 (ref 5.0–8.0)

## 2024-05-16 LAB — CBC WITH DIFFERENTIAL/PLATELET
Abs Immature Granulocytes: 0.05 K/uL (ref 0.00–0.07)
Basophils Absolute: 0 K/uL (ref 0.0–0.1)
Basophils Relative: 0 %
Eosinophils Absolute: 1.5 K/uL — ABNORMAL HIGH (ref 0.0–0.5)
Eosinophils Relative: 10 %
HCT: 42.1 % (ref 36.0–46.0)
Hemoglobin: 12.8 g/dL (ref 12.0–15.0)
Immature Granulocytes: 0 %
Lymphocytes Relative: 13 %
Lymphs Abs: 1.9 K/uL (ref 0.7–4.0)
MCH: 24.4 pg — ABNORMAL LOW (ref 26.0–34.0)
MCHC: 30.4 g/dL (ref 30.0–36.0)
MCV: 80.3 fL (ref 80.0–100.0)
Monocytes Absolute: 0.8 K/uL (ref 0.1–1.0)
Monocytes Relative: 6 %
Neutro Abs: 10.4 K/uL — ABNORMAL HIGH (ref 1.7–7.7)
Neutrophils Relative %: 71 %
Platelets: 163 K/uL (ref 150–400)
RBC: 5.24 MIL/uL — ABNORMAL HIGH (ref 3.87–5.11)
RDW: 15.9 % — ABNORMAL HIGH (ref 11.5–15.5)
WBC: 14.6 K/uL — ABNORMAL HIGH (ref 4.0–10.5)
nRBC: 0 % (ref 0.0–0.2)

## 2024-05-16 LAB — URINALYSIS, MICROSCOPIC (REFLEX): WBC, UA: >50 WBC/hpf (ref 0–5)

## 2024-05-16 NOTE — ED Notes (Signed)
 Patient transported to X-ray

## 2024-05-16 NOTE — ED Provider Notes (Signed)
 [place] [facility] AT [facility] Provider Note   CSN: [id number] Arrival date & time: 05/16/24  8146     Patient presents with: Leg Swelling   Kelli Rivera is a 73 y.o. female.  {Add pertinent medical, surgical, social history, OB history to YEP:67052} The history is provided by the patient.   Kelli Rivera is a 73 y.o. female who presents to the Emergency Department complaining of *** Kelli Rivera is a 73 y.o. female who presents to the Emergency Department complaining of *** Nausea tonight Bilateral leg swelling, pain - long time. Now red. 3-4 days ago No fever No chest pain.  Sleeps with oxygen .  No dysuria.   Right low back pain started yesterday. No change in urine.   No injuries.  Feels unwell.  Hx/o DM, COPD, CHF, RA.     Prior to Admission medications   Medication Sig Start Date End Date Taking? Authorizing Provider  albuterol  (PROVENTIL  HFA;VENTOLIN  HFA) 108 (90 Base) MCG/ACT inhaler Inhale 2 puffs into the lungs every 4 (four) hours as needed for wheezing or shortness of breath. 12/17/15   [provider]  aspirin  EC 81 MG tablet Take 81 mg by mouth daily.    [provider]  atorvastatin  (LIPITOR ) 80 MG tablet TAKE 1 TABLET(80 MG) BY MOUTH DAILY AT 6 PM 12/22/21   Burnard Debby LABOR, MD  benzonatate  (TESSALON ) 100 MG capsule Take 1 capsule (100 mg total) by mouth 3 (three) times daily as needed for cough. 06/22/20   Rai, Ripudeep MARLA, MD  cephALEXin  (KEFLEX ) 500 MG capsule Take 1 capsule (500 mg total) by mouth 3 (three) times daily. 09/11/20   Patt Alm Macho, MD  ENBREL SURECLICK 50 MG/ML injection Inject 50 mg into the skin once a week. 01/15/20   [provider]  famotidine  (PEPCID ) 20 MG tablet Take 1 tablet (20 mg total) by mouth 2 (two) times daily. 10/09/21   Geiple, Joshua, PA-C  famotidine  (PEPCID ) 20 MG tablet Take 1 tablet (20 mg total) by mouth 2 (two) times daily. Take 30 minutes before breakfast and  dinner 10/03/23 11/02/23  Cottie Donnice PARAS, MD  FARXIGA 5 MG TABS tablet Take 5 mg by mouth daily. 06/04/20   [provider]  fluticasone  (FLONASE ) 50 MCG/ACT nasal spray Place 2 sprays into both nostrils daily as needed for allergies. 10/11/18   [provider]  Fluticasone -Salmeterol (ADVAIR DISKUS) 500-50 MCG/DOSE AEPB Inhale 1 puff into the lungs 2 (two) times daily.     [provider]  furosemide  (LASIX ) 40 MG tablet Take 1 tablet (40 mg total) by mouth daily. 10/01/17   Ghimire, Donalda HERO, MD  ipratropium-albuterol  (DUONEB) 0.5-2.5 (3) MG/3ML SOLN Take 3 mLs by nebulization 4 (four) times daily. Patient taking differently: Take 3 mLs by nebulization every 6 (six) hours as needed (sob/wheezing).  10/01/17   Ghimire, Donalda HERO, MD  lidocaine  (LIDODERM ) 5 % Place 2 patches onto the skin daily. Remove & Discard patch within 12 hours or as directed by MD 06/22/20   Rai, Nydia MARLA, MD  loperamide  (IMODIUM ) 2 MG capsule Take 1 capsule (2 mg total) by mouth 4 (four) times daily as needed for diarrhea or loose stools. 10/09/21   Geiple, Joshua, PA-C  metFORMIN (GLUCOPHAGE) 1000 MG tablet Take 1,000 mg by mouth daily with breakfast.     [provider]  methocarbamol  (ROBAXIN ) 500 MG tablet Please take 3 times daily for 7 days, then 3 times daily as  needed for muscle spasms/back pain 06/22/20   Rai, Nydia POUR, MD  montelukast  (SINGULAIR ) 10 MG tablet Take 10 mg by mouth every morning.  08/18/15   [provider]  Multiple Vitamins-Minerals (CENTRUM SILVER 50+WOMEN PO) Take 1 tablet by mouth daily.    [provider]  ondansetron  (ZOFRAN -ODT) 4 MG disintegrating tablet Take 1 tablet (4 mg total) by mouth every 8 (eight) hours as needed. 09/30/23   Curatolo, Adam, DO  oseltamivir  (TAMIFLU ) 75 MG capsule Take 1 capsule (75 mg total) by mouth every 12 (twelve) hours. 09/19/21   Jerrol Agent, MD  oxybutynin  (DITROPAN  XL) 15 MG 24 hr tablet Take 15 mg by mouth  daily. 06/01/18   [provider]  pantoprazole  (PROTONIX ) 20 MG tablet Take 1 tablet (20 mg total) by mouth 2 (two) times daily. 06/06/20   Randol Simmonds, MD  polyethylene glycol (MIRALAX  / GLYCOLAX ) 17 g packet Take 17 g by mouth daily as needed for mild constipation or moderate constipation. Also over the counter 06/22/20   Rai, Nydia POUR, MD  sucralfate  (CARAFATE ) 1 g tablet Take 1 tablet (1 g total) by mouth 4 (four) times daily -  with meals and at bedtime for 60 doses. 10/03/23 10/18/23  Cottie Donnice PARAS, MD  sucralfate  (CARAFATE ) 1 GM/10ML suspension Take 10 mLs (1 g total) by mouth 4 (four) times daily -  with meals and at bedtime. 10/09/21   Desiderio Chew, PA-C  TOUJEO  SOLOSTAR 300 UNIT/ML SOPN Inject 20 Units into the skin at bedtime.  08/31/16   [provider]  TRULICITY 1.5 MG/0.5ML SOPN Inject 1.5 mg into the skin every Thursday. 09/19/17   [provider]    Allergies: Liraglutide, Pioglitazone, and Amoxicillin -pot clavulanate    Review of Systems  All other systems reviewed and are negative.   Updated Vital Signs BP (!) 132/57   Pulse 76   Temp 98.2 F (36.8 C)   Resp 18   SpO2 95%   Physical Exam Vitals and nursing note reviewed.  Constitutional:      Appearance: She is well-developed.  HENT:     Head: Normocephalic and atraumatic.  Cardiovascular:     Rate and Rhythm: Normal rate and regular rhythm.     Heart sounds: No murmur heard. Pulmonary:     Effort: Pulmonary effort is normal. No respiratory distress.     Breath sounds: Normal breath sounds.  Abdominal:     Palpations: Abdomen is soft.     Tenderness: There is no abdominal tenderness. There is no guarding or rebound.  Musculoskeletal:        General: Swelling present.     Comments: Pitting edema and erythema to BLE, right greater than left.  2+ DP pulses bilaterally.  No vesicles or wounds.   TTP over right SI joint without overlying rash.   Skin:    General: Skin is warm and  dry.  Neurological:     Mental Status: She is alert and oriented to person, place, and time.     Comments: 5/5 strength in BLE with sensation to light touch in bilateral lower extremities.    Psychiatric:        Behavior: Behavior normal.     (all labs ordered are listed, but only abnormal results are displayed) Labs Reviewed  CBC WITH DIFFERENTIAL/PLATELET - Abnormal; Notable for the following components:      Result Value   WBC 14.6 (*)    RBC 5.24 (*)    MCH 24.4 (*)  RDW 15.9 (*)    Neutro Abs 10.4 (*)    Eosinophils Absolute 1.5 (*)    All other components within normal limits  COMPREHENSIVE METABOLIC PANEL WITH GFR - Abnormal; Notable for the following components:   Glucose, Bld 123 (*)    All other components within normal limits  URINALYSIS, ROUTINE W REFLEX MICROSCOPIC    EKG: None  Radiology: No results found.  {Document cardiac monitor, telemetry assessment procedure when appropriate:32947} Procedures   Medications Ordered in the ED - No data to display    {Click here for ABCD2, HEART and other calculators REFRESH Note before signing:1}                              Medical Decision Making Amount and/or Complexity of Data Reviewed Labs: ordered.   ***  {Document critical care time when appropriate  Document review of labs and clinical decision tools ie CHADS2VASC2, etc  Document your independent review of radiology images and any outside records  Document your discussion with family members, caretakers and with consultants  Document social determinants of health affecting pt's care  Document your decision making why or why not admission, treatments were needed:32947:::1}   Final diagnoses:  None    ED Discharge Orders     None

## 2024-05-16 NOTE — ED Triage Notes (Signed)
 Pt c/o nausea, not feeling good, swelling, pain and redness to legs

## 2024-05-17 DIAGNOSIS — N390 Urinary tract infection, site not specified: Secondary | ICD-10-CM | POA: Diagnosis not present

## 2024-05-17 DIAGNOSIS — R0602 Shortness of breath: Secondary | ICD-10-CM | POA: Diagnosis not present

## 2024-05-17 DIAGNOSIS — I110 Hypertensive heart disease with heart failure: Secondary | ICD-10-CM | POA: Diagnosis not present

## 2024-05-17 DIAGNOSIS — R600 Localized edema: Secondary | ICD-10-CM | POA: Diagnosis not present

## 2024-05-17 DIAGNOSIS — I5022 Chronic systolic (congestive) heart failure: Secondary | ICD-10-CM | POA: Diagnosis not present

## 2024-05-17 DIAGNOSIS — I2510 Atherosclerotic heart disease of native coronary artery without angina pectoris: Secondary | ICD-10-CM | POA: Diagnosis not present

## 2024-05-17 DIAGNOSIS — E782 Mixed hyperlipidemia: Secondary | ICD-10-CM | POA: Diagnosis not present

## 2024-05-17 DIAGNOSIS — I700 Atherosclerosis of aorta: Secondary | ICD-10-CM | POA: Diagnosis not present

## 2024-05-17 MED ORDER — CEFDINIR 300 MG PO CAPS: 300.0000 mg | ORAL_CAPSULE | Freq: Two times a day (BID) | ORAL | 0 refills | Status: AC

## 2024-05-17 MED ORDER — ONDANSETRON 4 MG PO TBDP: 4.0000 mg | ORAL_TABLET | Freq: Three times a day (TID) | ORAL | 0 refills | Status: AC | PRN

## 2024-05-17 MED ORDER — LIDOCAINE HCL (PF) 1 % IJ SOLN
INTRAMUSCULAR | Status: AC
  Filled 2024-05-17: qty 5

## 2024-05-17 MED ORDER — CEFTRIAXONE SODIUM 1 G IJ SOLR
1.0000 g | Freq: Once | INTRAMUSCULAR | Status: AC
  Administered 2024-05-17: 1 g via INTRAMUSCULAR
  Filled 2024-05-17: qty 10

## 2024-05-17 NOTE — Progress Notes (Signed)
 Patient Name: Kelli Rivera Patient Date of Birth: [birth year] Patient MR#: [id number]  PCP: Thersia Earnie Pizza, MD Date: 05/17/2024    [facility]   Assessment and Plan:    1. Chronic heart failure with mildly reduced ejection fraction (HFmrEF, 41-49%)    (CMD)  Transthoracic echo (TTE) complete    2. Benign essential HTN  Transthoracic echo (TTE) complete    3. Coronary artery disease involving native coronary artery of native heart without angina pectoris      4. BMI 45.0-49.9, adult (HCC)      5. Mixed hyperlipidemia      6. Bilateral leg edema           ASSESSMENT & PLAN:  AHA stage B class I CHF due to HFmEF with EF 40 to 45% felt due to obesity hypertension diabetes sleep apnea. EF 45-50% 10/2022   On max tol GDMT-  no med changes today, continue metoprolol succinate 25 mg half tablet daily ramipril 1.25 mg once daily dapagliflozin 5 mg daily and most importantly continue with weight loss   Asked her to stay as active as possible throughout the day, and to try water aerobics to increase exercise.   Completed cardiac rehab.  7/25 - tte ordered, cont medications unchanged.   Minimal CAD- 1/24 last LDL 92 on atorvastatin  80 mg daily, adding Zetia 10 mg daily, repeat lipids in 4 months - see below  January 2025-reminded to follow through with previous instructions to add co-Q10 100 mg daily, continue Zetia 10 mg daily after 1 week add back atorvastatin  80 mg daily if her daily muscle aches appear to be worsened with the atorvastatin  she should stop it and we will prescribe Crestor and if she gets myalgias from that would proceed with a PCSK9 inhibitor   Mixed dyslipidemia- discussed dietary changes to help boost her HDL.  Consider adding fish oil or Vascepa.  04/2023 today have added Zetia 10 mg daily co-Q10 200 mg daily and will resume atorvastatin  after 1 week of co-Q10.  If leg pain resumes she will stop the  atorvastatin  and let us  know we will try rosuvastatin and if the rosuvastatin causes symptoms will proceed with PCSK9 inhibitor.  Reviewed with patient and daughter.  Needs to improve diet.   Obesity- Ideally would benefit from significant weight loss- reviewed importance of weight loss to health, and to prevent deterioration of cardiac status.   OSA-strongly recommended compliance with CPAP  Venous insufficiency-> mild leg edema - w clear lungs, nl BNP, CXR NAI consistently, weight unchanged, trace to mild LE edema, prn extra lasix , increase leg movement, try support socks. F/u pcp.  FOLLOW UP: The patient will come back to see me in 6 mo  Thank you for your kind referral. I appreciate being able to take care of your patient.  Erna Ardelia Creighton MD   Subjective:    Reason for Consult/Visit: Follow-up (77mo   c/o leg swelling x 1week)    History of Present Illness: Kelli Rivera is a 73 y.o. female who presents for initial evaluation  03/2021 due to recent finding of HFmEF.  Risk factors include morbid obesity with a BMI greater than 45, associated diabetes, hypertension, dyslipidemia and she also has COPD.  She underwent a coronary artery CT angiogram which showed an ca++ score of 5 in the LAD consistent with minimal nonobstructive LAD disease.  She was treated with the addition of furosemide  40 mg  twice daily at discharge.  Dapagliflozin 5 mg daily was added to her medical regimen and may.  Discharge potassium 3.7 BUN 16 creatinine 0.67   She continues with limiting dyspnea after about 5 to 10 minutes of ADLs and/or walking room to room.  She self limits based on her dyspnea.  She will have orthopnea with 2 pillows but sometimes has to sleep straight up due to orthopnea.  She has intermittent lower extremity edema.  She has been self adjusting both dapagliflozin and furosemide  based on the degree of edema.  Sleeps with oxygen  nightly but struggles with CPAP and uses it  intermittently.  Weights on her home scale are typically 270 but this morning she was 259 on her home scale.   No angina or arrhythmia symptoms.  Reports her COPD is much better she quit smoking 18 years ago.   11/11/2022   She is doing well.  her DOE is improved, however she will still desaturate with activity and will have to stop and use oxygen .  Her activities are limited by knee pain.  Denies orthopnea or PND, no chest discomfort overall improved compared to prior.  05/12/23  Doing well, no chest discomfort orthopnea or PND.  Continues with limiting knee pain and dyspnea.  Reports she had leg pain with atorvastatin  and stopped it hence her lipids were elevated.  Also not using CPAP and complains of being sleepy throughout the day.   11/17/2023  Losing weight, no angina, CHF or arrhythmia symptoms. BP's normotensive.  At last visit she was instructed to hold her atorvastatin  for a week add co-Q10 and then resume the atorvastatin , but instead she has remained off of atorvastatin .  Continues with generalized bodyaches.  05/17/24  No shortness of breath no anginal or heart failure or arrhythmia symptoms.  BPs normal.  Weight stable at 247 pounds blood pressure is 112/71 heart rate 80.  Complains of week of lower extremity edema.  Chest x-ray was NAI, lab work was unremarkable.  Pertinent cardiac testing results:   [facility] March 2022:   Echo: 01/23/20  IMPRESSIONS    1. Left ventricular ejection fraction, by estimation, is 40 to 45%. The  left ventricle has mildly decreased function. The left ventricle  demonstrates global hypokinesis. Left ventricular diastolic parameters are  indeterminate.   2. Right ventricular systolic function is normal. The right ventricular  size is normal.   3. The mitral valve is normal in structure. Trivial mitral valve  regurgitation. No evidence of mitral stenosis.   4. The aortic valve is tricuspid. Aortic valve regurgitation is trivial.  No aortic stenosis  is present.   5. The inferior vena cava is dilated in size with <50% respiratory  variability, suggesting right atrial pressure of 15 mmHg.   Comparison(s): No significant change from prior study.   TTE 11/24/2022  SUMMARY   There is normal left ventricular wall thickness. There is mild global  hypokinesis of the left ventricle. Left ventricular systolic function is  mildly reduced. LV ejection fraction = 45-50%.  There is mild, Grade I diastolic dysfunction, with indeterminate left  atrial  pressure. Left atrial pressure is indeterminate due to  E-e  > 8 and < 14  The right ventricle is normal in size and function.  The atria are normal in size.  There is no significant valvular stenosis or regurgitation.  There was insufficient TR detected to calculate RV systolic pressure.  IVC size was moderately dilated.  The aortic sinus is normal size.  There is no comparison study available. Prior reported EF 40-45% 2021 TTE  outside institution.   _____________  History of Present Illness   Londen Lorge Kendrick is a 73 y.o. female with a history of COPD, type 2 diabetes mellitus, hyperlipidemia, and morbid obesity who presented to the hospital after a single episode of chest tightness while she was shopping at a grocery store. The patient stated that she developed substernal chest pain as she was exerting herself. The sensation lasted for less than 5 minutes. There was no radiation of the chest pain. She had some associated diaphoresis. She denied any nausea, vomiting or dyspnea. For the past few weeks she has noticed similar discomfort on exertion. Greater than 5 years ago she recalled having had a stress test. At that time her cardiac work-up was negative.    In the hospital her blood pressure is 119/67 mmHg with a heart rate of 74 bpm. Her oxygen  saturation is 97%. The electrocardiogram revealed nonspecific ST-T wave abnormalities in the lateral leads. There was also a prolonged QT interval. The  high-sensitivity troponins were 212 and 3.   Her labs were as follows: Potassium 3.7, creatinine 0.67, WBC 12.4, hemoglobin 12.7, hematocrit 42.7 and platelets 205. The chest x-ray was unremarkable. She was admitted for further work up.   Hospital Course   1. Chest pain: initial hsTn was elevated at 212 but repeat were normal. Stated she had several episodes of chest tightness while at rest over the past 2 weeks, but episode the day of admission was the worst. EKG with nonspecific TW changes in lateral leads. Underwent coronary CT with coronary Ca+ score of 5 with mild CAD in the mLAD. Follow up echo showed normal EF with no WMA. No further chest pain during admission. Plan to treat medically.    2. IDDM: on metformin, Toujeo  and Trulicity prior to admission. Hgb A1c 7.0   3. HL: not on statin prior admission. LDL 86. Started on high dose statin.    Noise artifact is: Moderate signal-to-noise due to patient obesity (BMI 46). This prohibited evaluation of the distal LCX and PDA. Coronary Arteries: Normal coronary origin. Left dominance. Left main: The left main is a large caliber, short vessel with a normal take off from the left coronary cusp that bifurcates to form a left anterior descending artery and a left circumflex artery. There is no plaque or stenosis. Left anterior descending artery: The proximal LAD contains minimal calcified plaque. The mid LAD contains mild non-calcified plaque (25-49%). The distal LAD is patent. Left circumflex artery: The LCX is dominant. The LCX contains minimal non-calcified plaque (<25%). The LCX gives off 1 large obtuse marginal branch with minimal non-calcified plaque. The LCX appears to supply the PDA but evaluation of this is limited due to moderate signal to noise artifact. Right coronary artery: The RCA is non-dominant with normal take off from the right coronary cusp. There is minimal non-calcified plaque (<25%). Right Atrium: Right atrial size is within normal  limits. Right Ventricle: The right ventricular cavity is within normal limits. Left Atrium: Left atrial size is normal in size with no left atrial appendage filling defect. Left Ventricle: The ventricular cavity size is within normal limits. There are no stigmata of prior infarction. There is no abnormal filling defect. Pulmonary arteries: Mildly dilated without proximal filling defect. Pulmonary veins: Normal pulmonary venous drainage. Pericardium: Normal thickness with no significant effusion or calcium  present. Cardiac valves: The aortic valve is trileaflet without significant calcification. The mitral valve  is normal structure without significant calcification. Aorta: Normal caliber with no significant disease. Extra-cardiac findings: See attached radiology report for non-cardiac structures. IMPRESSION: 1. Coronary calcium  score of 5. This was 54th percentile for age and sex matched controls. 2. Normal coronary origin with left dominance. 3. Mild CAD in the mid LAD (25-49%). 4. Minimal CAD in the LCX and non-dominant RCA. 5. Limited evaluation of the distal LCX/PDA due to moderate signal to noise artifact due to obesity (BMI 46). RECOMMENDATIONS: 1. Mild non-obstructive CAD (25-49%). Consider non-atherosclerotic causes of chest pain. Consider preventive therapy and risk factor modification. Darryle Decent, MD Electronically Signed By: Darryle Decent On: 01/23/2020 16:55     05/17/24    Pertinent cardiac testing results:  The following portions of the patient's history were reviewed and updated as appropriate:  Medical History: Past Medical History:  Diagnosis Date  . Anemia of chronic disease    Asymptomatic without current iron deficiency  . Benign essential HTN   . Chronic diastolic CHF (congestive heart failure)    (CMD)   . Chronic fatigue syndrome   . Chronic idiopathic constipation   . Chronic low back pain   . Chronic migraine without aura   . Chronic respiratory failure with hypoxia     (CMD)   . Class 3 severe obesity due to excess calories with serious comorbidity in adult   . COPD, moderate    (CMD)   . Dependence on nocturnal oxygen  therapy    2 liters  . Diverticulitis of colon   . Diverticulosis of colon   . Eosinophilia   . Gastric ulcer   . Gastroesophageal reflux disease without esophagitis   . Headache syndrome   . Inflammatory arthritis   . Iron deficiency anemia   . Mixed hyperlipidemia   . Mixed incontinence urge and stress   . NSTEMI (non-ST elevated myocardial infarction)    (CMD)   . Obesity due to excess calories with serious comorbidity   . OSA (obstructive sleep apnea)    CPAP Dependent but not compliant  . Polyarthralgia   . Primary osteoarthritis involving multiple joints   . Primary pulmonary HTN    (CMD)   . Rheumatoid arthritis    (CMD)   . Seasonal allergic rhinitis due to pollen   . Sepsis secondary to UTI    (CMD)   . SIRS (systemic inflammatory response syndrome)    (CMD)   . Streptococcal bacteremia   . Thyroid  nodule    2.5 x 2.7 cm cystic nodule on left lobe. FNAC benign in Sept 2014  . Tinnitus of both ears   . Type 2 diabetes mellitus with hyperglycemia, with long-term current use of insulin     (CMD)   . Venous insufficiency of both lower extremities      Family History: Family History  Problem Relation Name Age of Onset  . Heart disease Mother    . Lung cancer Father    . Hypertension Father    . Breast cancer Sister    . Lung cancer Sister    . Heart disease Sister    . Hypertension Sister    . Diabetes Sister    . Stroke Sister    . Thyroid  disease Sister 1/2   . Cancer Brother    . Hypertension Brother    . Glaucoma Brother         questionable  . Hypertension Daughter    . Colon cancer Neg Hx    . Clotting disorder Neg  Hx    . Allergic rhinitis Neg Hx    . Hearing loss Neg Hx    . Osteoporosis Neg Hx    . Migraines Neg Hx    . Macular degeneration Neg Hx      Medications:  Current Outpatient  Medications  Medication Sig Dispense Refill  . albuterol  HFA (PROVENTIL  HFA;VENTOLIN  HFA;PROAIR  HFA) 90 mcg/actuation inhaler Inhale 2 puffs every 6 (six) hours as needed for wheezing. 6.7 g 2  . Arava 20 mg tablet Take 1 tablet by mouth daily.    . aspirin  81 mg EC tablet Take 81 mg by mouth.    . atorvastatin  (LIPITOR ) 80 mg tablet Take 1 tablet (80 mg total) by mouth nightly. 90 tablet 3  . blood-glucose meter misc Use as directed to check BG.  Dx E11.65 1 each 0  . budesonide -glycopyr-formoterol  (Breztri Aerosphere) 160-9-4.8 mcg/actuation HFAA Inhale 2 puffs 2 (two) times a day. 1 each 11  . clotrimazole-betamethasone (LOTRISONE) 1-0.05 % cream Apply 1 Application topically 2 (two) times a day. 45 g 1  . desloratadine-pseudoephedrine (Clarinex-D 12 HOUR) 2.5-120 mg TM12 Take 1 tablet by mouth 2 (two) times a day for 15 days. 30 tablet 1  . EnbreL SureClick 50 mg/mL (1 mL) pnij injection Inject 50 mg under the skin every 7 days.    SABRA ezetimibe (ZETIA) 10 mg tablet Take 1 tablet (10 mg total) by mouth daily. 90 tablet 3  . Farxiga 5 mg tab tablet Take 1 tablet (5 mg total) by mouth daily. 90 tablet 3  . ferrous sulfate  325 mg (65 mg iron) tablet Take 325 mg by mouth 2 times a day for three days each week for 30 days. 60 tablet 5  . fluticasone  propion-salmeteroL (ADVAIR DISKUS) 500-50 mcg/dose diskus inhaler INHALE 1 PUFF INTO THE LUNGS EVERY 12 HOURS 60 each 5  . fluticasone  propionate (FLONASE ) 50 mcg/spray nasal spray Administer 2 sprays into each nostril daily. 16 g 11  . furosemide  (LASIX ) 40 mg tablet TAKE 1 TABLET BY MOUTH TWICE DAILY 180 tablet 0  . glucose blood (Accu-Chek Guide test strips) test strip USE AS DIRECTED TO CHECK BLOOD SUGAR ONCE DAILY.  Dx E11.65 100 strip 3  . hydroCHLOROthiazide (HYDRODIURIL) 25 mg tablet TAKE 1 TABLET(25 MG) BY MOUTH DAILY 90 tablet 1  . ibuprofen  (MOTRIN ) 800 mg tablet TAKE 1 TABLET BY MOUTH THREE TIMES DAILY, AFTER A MEAL., WITH FOOD OR MILK AS  NEEDED 90 tablet 1  . ipratropium-albuteroL  (DUO-NEB) 0.5-2.5 mg/3 mL nebulizer solution Take 3 mL by nebulization 4 (four) times a day. 90 each 2  . Lancets misc Check BG 3 times/day.  Dx E11.65 100 each 5  . LORazepam  (ATIVAN ) 0.5 mg tablet Take 1 tablet (0.5 mg total) by mouth every 8 (eight) hours as needed for anxiety. 30 tablet 0  . metFORMIN (GLUCOPHAGE) 1,000 mg tablet Take 1 tablet (1,000 mg total) by mouth daily. with food 90 tablet 3  . metoprolol succinate (TOPROL XL) 25 mg 24 hr tablet TAKE 1/2 TABLET(12.5 MG) BY MOUTH AT BEDTIME 15 tablet 11  . montelukast  (SINGULAIR ) 10 mg tablet TAKE 1 TABLET BY MOUTH AT NIGHT 90 tablet 1  . omeprazole (PriLOSEC) 40 mg DR capsule TAKE 1 CAPSULE BY MOUTH DAILY 90 capsule 3  . ondansetron  (ZOFRAN -ODT) 4 mg disintegrating tablet Dissolve 4 mg on tongue.    . oxybutynin  (DITROPAN  XL) 10 mg 24 hr tablet Take 20 mg by mouth Once Daily. 60 tablet 11  .  pen needle, diabetic 31 gauge x 3/16 ndle Use as needed for 1 injection daily 100 each 3  . polyethylene glycol (MIRALAX ) 17 gram powd powder Take  by mouth.    . ramipriL (ALTACE) 1.25 mg capsule Take 1.25 mg by mouth Once Daily. 30 capsule 5  . Toujeo  SoloStar U-300 Insulin  300 unit/mL (1.5 mL) pen pen Inject subcutaneous 20 units once daily with additional as needed. Max daily dose of 45 units 4.5 mL 5  . Trulicity 3 mg/0.5 mL subcutaneous pen injector Inject 3.0mg  subcutaneous once a week.  Dx E11.65 2 mL 5   No current facility-administered medications for this visit.    Allergies: Allergies  Allergen Reactions  . Pioglitazone Swelling and Other (See Comments)    Reaction: Sweating (intolerance)   . Leflunomide Other (See Comments)  . Amoxicillin -Pot Clavulanate GI Intolerance  . Liraglutide GI Intolerance    Social History: Social History   Tobacco Use  Smoking Status Former  . Current packs/day: 0.00  . Average packs/day: 1 pack/day for 41.0 years (41.0 ttl pk-yrs)  . Types:  Cigarettes  . Start date: 10/25/1962  . Quit date: 10/26/2003  . Years since quitting: 20.5  . Passive exposure: Past  Smokeless Tobacco Never   Social History   Substance and Sexual Activity  Alcohol Use No   Social History   Substance and Sexual Activity  Drug Use No    Review of Systems: A complete ROS performed with pertinent positives as per HPI      Objective:      Physical Exam:  VITAL SIGNS:  Blood pressure 112/71, pulse 80, height 1.6 m (5' 3), weight 112 kg (247 lb). Wt Readings from Last 3 Encounters:  05/17/24 112 kg (247 lb)  05/09/24 116 kg (255 lb)  03/23/24 113 kg (249 lb)   Body mass index is 43.75 kg/m.  General: WD WN obese AA woman in NAD   HEENT:   PERRL, EOMI.  Oropharynx is moist. No icterus.   Neck: Carotid pulses are 2+.  No bruits.  No adenopathy, thyromegaly or masses.  Jugular venous pressure is normal. No HJR   Lungs:   Respirations are unlabored. The lungs are clear in all fields bilaterally without rales, rhonchi or scant wheezing. Resolved 11/11/22 and 7/24, 1/25, 7/25 .    Percussion note is normal throughout.   Heart:  Heart sounds are regular rate and rhythm  Normal S1. Normal S2.  No S3 or S4.  No R/M/G   Abdomen:   Soft, nontender and nondistended.  There are normal bowel sounds.  No bruits.  No masses or organomegaly.    Extremities: Warm. No rashes or ulcers.  Trace L > R  lower extremity pitting edema. 7/25 Trace R > L   Pulses: Radial DP and PT pulses are 2+ and symmetrical.   Skin: No lower extremity rashes or ulcers   Neurologic:  The patient is awake, alert, and oriented to time, place, person and situation, and no strength deficits.     EKG: Sinus rhythm at a rate of 73 beats a minute a PVC and nonspecific ST-T wave changes mainly flattening of her ST segments   Lab Review: Lab Results  Component Value Date   WBC 12.50 (H) 12/08/2023   RBC 4.78 12/08/2023   HGB 11.8 (L) 12/08/2023   HCT 38.7 12/08/2023   MCV  80.9 12/08/2023   MCH 24.6 (L) 12/08/2023   MCHC 30.4 (L) 12/08/2023   RDW 16.8 12/08/2023  PLT 214 12/08/2023   MPV 9.7 12/08/2023  . Lab Results  Component Value Date   NA 142 05/09/2024   K 4.4 05/09/2024   CL 104 05/09/2024   CO2 31 05/09/2024   BUN 10 05/09/2024   CREATININE 0.69 05/09/2024   EGFR >90 05/09/2024   GLUCOSE 120 (H) 05/09/2024   Lab Results  Component Value Date   HGBA1C 6.8 (A) 10/07/2022      CVD RISK STRATIFICATION RESULTS: Lab Results  Component Value Date   CHOL 173 12/08/2023   HDL 41 (L) 12/08/2023   TRIG 170 (H) 12/08/2023   BNP 21 12/11/2021   HGBA1C 6.8 (A) 10/07/2022   I spent 35 minutes with patient; more than 50% of the time was spent counseling patient and arranging treatment.   The ASCVD Risk score (Arnett DK, et al., 2019) failed to calculate for the following reasons:   Risk score cannot be calculated because patient has a medical history suggesting prior/existing ASCVD

## 2024-05-19 LAB — URINE CULTURE: Culture: >=100000 — AB

## 2024-05-20 ENCOUNTER — Telehealth (HOSPITAL_BASED_OUTPATIENT_CLINIC_OR_DEPARTMENT_OTHER): Payer: Self-pay | Admitting: *Deleted

## 2024-05-20 NOTE — Progress Notes (Signed)
 ED Antimicrobial Stewardship Positive Culture Follow Up   Kelli Rivera is an 73 y.o. female who presented to [facility] on 05/16/2024 with a chief complaint of  Chief Complaint  Patient presents with   Leg Swelling    Recent Results (from the past 720 hours)  Urine Culture     Status: Abnormal   Collection Time: 05/17/24 12:19 AM   Specimen: Urine, Clean Catch  Result Value Ref Range Status   Specimen Description   Final    URINE, CLEAN CATCH Performed at [facility], [address] [place], [place] [address]    Special Requests   Final    NONE Performed at [facility], [address] [place], [place] [address]    Culture >=100,000 COLONIES/mL ESCHERICHIA COLI (A)  Final   Report Status 05/19/2024 FINAL  Final   Organism ID, Bacteria ESCHERICHIA COLI (A)  Final      Susceptibility   Escherichia coli - MIC*    AMPICILLIN <=2 SENSITIVE Sensitive     CEFAZOLIN <=4 SENSITIVE Sensitive     CEFEPIME  <=0.12 SENSITIVE Sensitive     CEFTRIAXONE  <=0.25 SENSITIVE Sensitive     CIPROFLOXACIN <=0.25 SENSITIVE Sensitive     GENTAMICIN <=1 SENSITIVE Sensitive     IMIPENEM <=0.25 SENSITIVE Sensitive     NITROFURANTOIN 32 SENSITIVE Sensitive     TRIMETH/SULFA <=20 SENSITIVE Sensitive     AMPICILLIN/SULBACTAM <=2 SENSITIVE Sensitive     PIP/TAZO <=4 SENSITIVE Sensitive ug/mL    * >=100,000 COLONIES/mL ESCHERICHIA COLI    Given Omnicef , concern for low concentration in urine so will switch to Keflex  since Cx now sensitive to same  New antibiotic prescription: Keflex   ED Provider: Alan Harari, PA-C   Dorn Poot 05/20/2024, 10:07 AM Clinical Pharmacist Monday - Friday phone -  [phone] Saturday - Sunday phone - [phone]

## 2024-05-20 NOTE — Telephone Encounter (Signed)
 Post ED Visit - Positive Culture Follow-up: Successful Patient Follow-Up  Culture assessed and recommendations reviewed by:  []  Rankin Dee, Pharm.D. []  Venetia Gully, Pharm.D., BCPS AQ-ID []  Garrel Crews, Pharm.D., BCPS []  Almarie Lunger, Pharm.D., BCPS []  [place], [address].D., BCPS, AAHIVP []  Rosaline Bihari, Pharm.D., BCPS, AAHIVP []  Vernell Meier, PharmD, BCPS []  Latanya Hint, PharmD, BCPS []  Donald Medley, PharmD, BCPS [x]  Dorn Poot, PharmD  Positive urine culture  []  Patient discharged without antimicrobial prescription and treatment is now indicated [x]  Organism is resistant to prescribed ED discharge antimicrobial []  Patient with positive blood cultures  Changes discussed with ED provider: Alan Harari, PA-C New antibiotic prescription Keflex  500mg  TID x 7 days Stop Cefdinir   Called to [facility], [place], [place]  Contacted patient, date 05/20/24, time 1029   Albino Alan Novak 05/20/2024, 10:26 AM

## 2024-05-22 DIAGNOSIS — I739 Peripheral vascular disease, unspecified: Secondary | ICD-10-CM | POA: Diagnosis not present

## 2024-05-22 DIAGNOSIS — B353 Tinea pedis: Secondary | ICD-10-CM | POA: Diagnosis not present

## 2024-06-11 DIAGNOSIS — Z794 Long term (current) use of insulin: Secondary | ICD-10-CM | POA: Diagnosis not present

## 2024-06-11 DIAGNOSIS — E1165 Type 2 diabetes mellitus with hyperglycemia: Secondary | ICD-10-CM | POA: Diagnosis not present

## 2024-06-26 DIAGNOSIS — I5189 Other ill-defined heart diseases: Secondary | ICD-10-CM | POA: Diagnosis not present

## 2024-06-26 DIAGNOSIS — I428 Other cardiomyopathies: Secondary | ICD-10-CM | POA: Diagnosis not present

## 2024-07-03 DIAGNOSIS — R609 Edema, unspecified: Secondary | ICD-10-CM | POA: Diagnosis not present

## 2024-07-03 DIAGNOSIS — I5022 Chronic systolic (congestive) heart failure: Secondary | ICD-10-CM | POA: Diagnosis not present

## 2024-07-03 DIAGNOSIS — J301 Allergic rhinitis due to pollen: Secondary | ICD-10-CM | POA: Diagnosis not present

## 2024-07-03 DIAGNOSIS — E782 Mixed hyperlipidemia: Secondary | ICD-10-CM | POA: Diagnosis not present

## 2024-07-25 ENCOUNTER — Emergency Department (HOSPITAL_BASED_OUTPATIENT_CLINIC_OR_DEPARTMENT_OTHER): Admission: EM | Admit: 2024-07-25 | Discharge: 2024-07-25 | Disposition: A | Discharge status: Home / Self Care

## 2024-07-25 ENCOUNTER — Emergency Department (HOSPITAL_BASED_OUTPATIENT_CLINIC_OR_DEPARTMENT_OTHER)

## 2024-07-25 ENCOUNTER — Other Ambulatory Visit: Payer: Self-pay

## 2024-07-25 ENCOUNTER — Encounter (HOSPITAL_BASED_OUTPATIENT_CLINIC_OR_DEPARTMENT_OTHER): Payer: Self-pay

## 2024-07-25 DIAGNOSIS — Y92512 Supermarket, store or market as the place of occurrence of the external cause: Secondary | ICD-10-CM | POA: Insufficient documentation

## 2024-07-25 DIAGNOSIS — S0101XA Laceration without foreign body of scalp, initial encounter: Secondary | ICD-10-CM | POA: Insufficient documentation

## 2024-07-25 DIAGNOSIS — W19XXXA Unspecified fall, initial encounter: Secondary | ICD-10-CM

## 2024-07-25 DIAGNOSIS — S0191XA Laceration without foreign body of unspecified part of head, initial encounter: Secondary | ICD-10-CM | POA: Diagnosis not present

## 2024-07-25 DIAGNOSIS — I10 Essential (primary) hypertension: Secondary | ICD-10-CM | POA: Diagnosis not present

## 2024-07-25 DIAGNOSIS — R220 Localized swelling, mass and lump, head: Secondary | ICD-10-CM | POA: Diagnosis not present

## 2024-07-25 DIAGNOSIS — Z7982 Long term (current) use of aspirin: Secondary | ICD-10-CM | POA: Insufficient documentation

## 2024-07-25 DIAGNOSIS — J439 Emphysema, unspecified: Secondary | ICD-10-CM | POA: Diagnosis not present

## 2024-07-25 DIAGNOSIS — M25552 Pain in left hip: Secondary | ICD-10-CM | POA: Insufficient documentation

## 2024-07-25 DIAGNOSIS — W1809XA Striking against other object with subsequent fall, initial encounter: Secondary | ICD-10-CM | POA: Diagnosis not present

## 2024-07-25 DIAGNOSIS — S0990XA Unspecified injury of head, initial encounter: Secondary | ICD-10-CM | POA: Diagnosis present

## 2024-07-25 DIAGNOSIS — M542 Cervicalgia: Secondary | ICD-10-CM | POA: Diagnosis not present

## 2024-07-25 MED ORDER — ACETAMINOPHEN 325 MG PO TABS
650.0000 mg | ORAL_TABLET | Freq: Once | ORAL | Status: AC
  Administered 2024-07-25: 650 mg via ORAL
  Filled 2024-07-25: qty 2

## 2024-07-25 NOTE — ED Triage Notes (Signed)
 Pt arrives via [facility]. Pt was shopping at [employer] and her right knee gave out. Pt fell and hit her head on the shopping cart. No LOC. Does have a laceration to posterier head on left side. Denies blood thinners. Also states that she has left hip pain. Pt can stand and pivot.Uses a cane when walking

## 2024-07-25 NOTE — Discharge Instructions (Signed)
 Keep your wound clean and dry.  You can wash like normal but do not scrub at the staples.  Call your primary care doctor and follow-up in 7 to 10 days to have your staples removed.  You can use Tylenol  and Motrin  as needed for pain.

## 2024-07-25 NOTE — ED Notes (Signed)
 Assuming pt care at this time.

## 2024-07-25 NOTE — ED Provider Notes (Signed)
 [place] [facility] Provider Note   CSN: [id number] Arrival date & time: 07/25/24  9049     Patient presents with: Kelli Rivera is a 73 y.o. female.   73 year old female presents for evaluation of fall.  She states she lost her balance while at the store and fell and hit the back of her head on a shopping cart.  She did not lose consciousness.  States she is also having some left hip pain but denies any neck pain.  Admits to some headache at this time.  Denies being on any blood thinners.  Denies any other symptoms or concerns.   Fall Associated symptoms include headaches. Pertinent negatives include no chest pain, no abdominal pain and no shortness of breath.       Prior to Admission medications   Medication Sig Start Date End Date Taking? Authorizing Provider  albuterol  (PROVENTIL  HFA;VENTOLIN  HFA) 108 (90 Base) MCG/ACT inhaler Inhale 2 puffs into the lungs every 4 (four) hours as needed for wheezing or shortness of breath. 12/17/15   [provider]  aspirin  EC 81 MG tablet Take 81 mg by mouth daily.    [provider]  atorvastatin  (LIPITOR ) 80 MG tablet TAKE 1 TABLET(80 MG) BY MOUTH DAILY AT 6 PM 12/22/21   Burnard Debby LABOR, MD  benzonatate  (TESSALON ) 100 MG capsule Take 1 capsule (100 mg total) by mouth 3 (three) times daily as needed for cough. 06/22/20   Rai, Ripudeep MARLA, MD  cefdinir  (OMNICEF ) 300 MG capsule Take 1 capsule (300 mg total) by mouth 2 (two) times daily. 05/17/24   Griselda Norris, MD  cephALEXin  (KEFLEX ) 500 MG capsule Take 1 capsule (500 mg total) by mouth 3 (three) times daily. 09/11/20   Patt Alm Macho, MD  ENBREL SURECLICK 50 MG/ML injection Inject 50 mg into the skin once a week. 01/15/20   [provider]  famotidine  (PEPCID ) 20 MG tablet Take 1 tablet (20 mg total) by mouth 2 (two) times daily. 10/09/21   Geiple, Joshua, PA-C  famotidine  (PEPCID ) 20 MG tablet Take 1 tablet (20 mg  total) by mouth 2 (two) times daily. Take 30 minutes before breakfast and dinner 10/03/23 11/02/23  Cottie Donnice PARAS, MD  FARXIGA 5 MG TABS tablet Take 5 mg by mouth daily. 06/04/20   [provider]  fluticasone  (FLONASE ) 50 MCG/ACT nasal spray Place 2 sprays into both nostrils daily as needed for allergies. 10/11/18   [provider]  Fluticasone -Salmeterol (ADVAIR DISKUS) 500-50 MCG/DOSE AEPB Inhale 1 puff into the lungs 2 (two) times daily.     [provider]  furosemide  (LASIX ) 40 MG tablet Take 1 tablet (40 mg total) by mouth daily. 10/01/17   Ghimire, Donalda HERO, MD  ipratropium-albuterol  (DUONEB) 0.5-2.5 (3) MG/3ML SOLN Take 3 mLs by nebulization 4 (four) times daily. Patient taking differently: Take 3 mLs by nebulization every 6 (six) hours as needed (sob/wheezing).  10/01/17   Ghimire, Donalda HERO, MD  lidocaine  (LIDODERM ) 5 % Place 2 patches onto the skin daily. Remove & Discard patch within 12 hours or as directed by MD 06/22/20   Rai, Nydia MARLA, MD  loperamide  (IMODIUM ) 2 MG capsule Take 1 capsule (2 mg total) by mouth 4 (four) times daily as needed for diarrhea or loose stools. 10/09/21   Geiple, Joshua, PA-C  metFORMIN (GLUCOPHAGE) 1000 MG tablet Take 1,000 mg by mouth daily with breakfast.     [provider]  methocarbamol  (  ROBAXIN ) 500 MG tablet Please take 3 times daily for 7 days, then 3 times daily as needed for muscle spasms/back pain 06/22/20   Rai, Ripudeep K, MD  montelukast  (SINGULAIR ) 10 MG tablet Take 10 mg by mouth every morning.  08/18/15   [provider]  Multiple Vitamins-Minerals (CENTRUM SILVER 50+WOMEN PO) Take 1 tablet by mouth daily.    [provider]  ondansetron  (ZOFRAN -ODT) 4 MG disintegrating tablet Take 1 tablet (4 mg total) by mouth every 8 (eight) hours as needed. 05/17/24   Griselda Norris, MD  oseltamivir  (TAMIFLU ) 75 MG capsule Take 1 capsule (75 mg total) by mouth every 12 (twelve) hours. 09/19/21   Jerrol Agent, MD  oxybutynin  (DITROPAN  XL) 15 MG 24 hr tablet Take 15 mg by mouth daily. 06/01/18   [provider]  pantoprazole  (PROTONIX ) 20 MG tablet Take 1 tablet (20 mg total) by mouth 2 (two) times daily. 06/06/20   Randol Simmonds, MD  polyethylene glycol (MIRALAX  / GLYCOLAX ) 17 g packet Take 17 g by mouth daily as needed for mild constipation or moderate constipation. Also over the counter 06/22/20   Rai, Nydia POUR, MD  sucralfate  (CARAFATE ) 1 g tablet Take 1 tablet (1 g total) by mouth 4 (four) times daily -  with meals and at bedtime for 60 doses. 10/03/23 10/18/23  Cottie Donnice PARAS, MD  sucralfate  (CARAFATE ) 1 GM/10ML suspension Take 10 mLs (1 g total) by mouth 4 (four) times daily -  with meals and at bedtime. 10/09/21   Desiderio Chew, PA-C  TOUJEO  SOLOSTAR 300 UNIT/ML SOPN Inject 20 Units into the skin at bedtime.  08/31/16   [provider]  TRULICITY 1.5 MG/0.5ML SOPN Inject 1.5 mg into the skin every Thursday. 09/19/17   [provider]    Allergies: Liraglutide, Pioglitazone, and Amoxicillin -pot clavulanate    Review of Systems  Constitutional:  Negative for chills and fever.  HENT:  Negative for ear pain and sore throat.   Eyes:  Negative for pain and visual disturbance.  Respiratory:  Negative for cough and shortness of breath.   Cardiovascular:  Negative for chest pain and palpitations.  Gastrointestinal:  Negative for abdominal pain and vomiting.  Genitourinary:  Negative for dysuria and hematuria.  Musculoskeletal:  Negative for arthralgias and back pain.  Skin:  Negative for color change and rash.  Neurological:  Positive for headaches. Negative for seizures and syncope.  All other systems reviewed and are negative.   Updated Vital Signs BP (!) 120/55   Pulse 60   Temp 97.8 F (36.6 C) (Oral)   Resp 17   Ht 5' 3 (1.6 m)   Wt 112.9 kg   SpO2 96%   BMI 44.11 kg/m   Physical Exam Vitals and nursing note reviewed.  Constitutional:       General: She is not in acute distress.    Appearance: She is well-developed.  HENT:     Head: Normocephalic.     Comments: 2.6 cm laceration to posterior scalp, bleeding controlled Eyes:     Conjunctiva/sclera: Conjunctivae normal.  Cardiovascular:     Rate and Rhythm: Normal rate and regular rhythm.     Heart sounds: No murmur heard. Pulmonary:     Effort: Pulmonary effort is normal. No respiratory distress.     Breath sounds: Normal breath sounds.  Abdominal:     Palpations: Abdomen is soft.     Tenderness: There is no abdominal tenderness.  Musculoskeletal:  General: No swelling.     Cervical back: Neck supple.  Skin:    General: Skin is warm and dry.     Capillary Refill: Capillary refill takes less than 2 seconds.  Neurological:     Mental Status: She is alert.  Psychiatric:        Mood and Affect: Mood normal.     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 Soft tissues are unremarkable. IMPRESSION: No acute fracture, pelvic bone diastasis, or dislocation. Electronically Signed   By: Rogelia Myers M.D.   On: 07/25/2024 11:01   CT Cervical Spine Wo Contrast Result Date: 07/25/2024 EXAM: CT CERVICAL SPINE WITHOUT CONTRAST 07/25/2024 10:37:00 AM TECHNIQUE: CT of the cervical spine was performed without the administration of intravenous contrast. Multiplanar reformatted images are provided for review. Automated exposure control, iterative reconstruction, and/or weight based adjustment of the mA/kV was utilized to reduce the radiation dose  to as low as reasonably achievable. COMPARISON: None available. CLINICAL HISTORY: Fall, hit head, neck pain. Pt arrives via [employer]. Pt was shopping at [employer] and her right knee gave out. Pt fell and hit her head on the shopping cart. No LOC. Does have a laceration to posterior head on left side. Denies blood thinners. Also states that she has left hip pain. Pt can stand and pivot. Uses a cane when walking. FINDINGS: CERVICAL SPINE: BONES AND ALIGNMENT: Straightening and slight reversal of the normal cervical lordosis. No traumatic malalignment. DEGENERATIVE CHANGES: Disc osteophyte complexes at multiple levels. There is no high grade osseous spinal canal stenosis. Facet arthrosis and uncovertebral hypertrophy at multiple levels. SOFT TISSUES: No prevertebral soft tissue swelling. 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 IMPRESSION: 1. No acute abnormality of the cervical spine related to the reported fall and neck pain. Electronically signed by: Donnice Mania MD 07/25/2024 10:50 AM EDT RP Workstation: [id number]   CT Head Wo Contrast Result Date: 07/25/2024 EXAM: CT HEAD WITHOUT CONTRAST 07/25/2024 10:37:00 AM TECHNIQUE: CT of the head was performed without the administration of intravenous contrast. Automated exposure control, iterative reconstruction, and/or weight based adjustment of the mA/kV was utilized to reduce the radiation dose to as low as reasonably achievable. COMPARISON: CT head 02/08/2023. CLINICAL HISTORY: Fall, hit head. Pt arrives via [employer]. Pt was shopping at [employer] and her right knee gave out. Pt fell and hit her head on the shopping cart. No LOC. Does have a laceration to posterior head on left side. Denies blood thinners. Also states that she has left hip pain. Pt can stand and pivot. Uses a cane when walking. FINDINGS: BRAIN AND VENTRICLES: No acute hemorrhage. No evidence of acute infarct. No hydrocephalus. No extra-axial collection. No mass effect or midline shift.  Atherosclerosis of the carotid siphons. ORBITS: No acute abnormality. SINUSES: Mucosal thickening in the right sphenoid sinus. SOFT TISSUES AND SKULL: Mild soft tissue swelling in the left parietal scalp. No skull fracture. IMPRESSION: 1. No acute intracranial abnormality. 2. Mild soft tissue swelling in the left parietal scalp, likely related to the reported fall. Electronically signed by: Donnice Mania MD  07/25/2024 10:45 AM EDT RP Workstation: [id number]     .Laceration Repair  Date/Time: 07/25/2024 11:30 AM  Performed by: Gennaro Duwaine CROME, DO Authorized by: Gennaro Duwaine CROME, DO   Consent:    Consent obtained:  Verbal Universal protocol:    Patient identity confirmed:  Verbally with patient Anesthesia:    Anesthesia method:  None Laceration details:    Location:  Scalp   Scalp location:  Occipital   Length (cm):  2.6 Exploration:    Limited defect created (wound extended): no     Contaminated: no   Treatment:    Area cleansed with:  Saline and chlorhexidine    Amount of cleaning:  Standard   Irrigation solution:  Sterile saline   Irrigation method:  Syringe   Visualized foreign bodies/material removed: no     Debridement:  None   Undermining:  None   Scar revision: no   Skin repair:    Repair method:  Staples   Number of staples:  2 Approximation:    Approximation:  Close Repair type:    Repair type:  Simple Post-procedure details:    Dressing:  Open (no dressing)   Procedure completion:  Tolerated    Medications Ordered in the ED  acetaminophen  (TYLENOL ) tablet 650 mg (650 mg Oral Given 07/25/24 1021)                                    Medical Decision Making Patient here for scalp laceration after a fall.  CT and imaging is negative.  Laceration repaired as above.  Patient Toller the procedure well.  Advise close follow-up with primary care in 7 to 10 days for staple removal and Tylenol  as needed for pain.  Advised return for any new or worsening symptoms.   Patient and family feel comfortable plan we discharged home.  Problems Addressed: Fall, initial encounter: acute illness or injury Laceration of scalp, initial encounter: acute illness or injury Left hip pain: acute illness or injury  Amount and/or Complexity of Data Reviewed External Data Reviewed: notes.    Details: Prior ED records reviewed and patient last seen 03/13/2024 for UTI Radiology: ordered and independent interpretation performed. Decision-making details documented in ED Course.    Details: Ordered and interpreted me independently radiology CT head: Shows no acute abnormality except for small hematoma on the scalp but no internal abnormality CT C-spine: Shows no bony abnormality Left hip x-ray: Shows no acute bony abnormality  Risk OTC drugs. Prescription drug management.    Final diagnoses:  Fall, initial encounter  Laceration of scalp, initial encounter  Left hip pain    ED Discharge Orders     None          Gennaro Duwaine CROME, DO 07/25/24 1502

## 2024-08-02 DIAGNOSIS — Z23 Encounter for immunization: Secondary | ICD-10-CM | POA: Diagnosis not present

## 2024-08-02 DIAGNOSIS — Z4802 Encounter for removal of sutures: Secondary | ICD-10-CM | POA: Diagnosis not present

## 2024-08-07 DIAGNOSIS — M0579 Rheumatoid arthritis with rheumatoid factor of multiple sites without organ or systems involvement: Secondary | ICD-10-CM | POA: Diagnosis not present

## 2024-08-07 DIAGNOSIS — R5382 Chronic fatigue, unspecified: Secondary | ICD-10-CM | POA: Diagnosis not present

## 2024-08-07 DIAGNOSIS — M1991 Primary osteoarthritis, unspecified site: Secondary | ICD-10-CM | POA: Diagnosis not present

## 2024-08-16 DIAGNOSIS — N3281 Overactive bladder: Secondary | ICD-10-CM | POA: Diagnosis not present

## 2024-08-16 DIAGNOSIS — R202 Paresthesia of skin: Secondary | ICD-10-CM | POA: Diagnosis not present

## 2024-08-16 DIAGNOSIS — R350 Frequency of micturition: Secondary | ICD-10-CM | POA: Diagnosis not present

## 2024-08-16 DIAGNOSIS — R262 Difficulty in walking, not elsewhere classified: Secondary | ICD-10-CM | POA: Diagnosis not present

## 2024-08-16 DIAGNOSIS — R2689 Other abnormalities of gait and mobility: Secondary | ICD-10-CM | POA: Diagnosis not present

## 2024-08-16 DIAGNOSIS — Z9181 History of falling: Secondary | ICD-10-CM | POA: Diagnosis not present

## 2024-11-05 ENCOUNTER — Telehealth: Payer: Self-pay

## 2024-11-05 NOTE — Telephone Encounter (Signed)
 Pt is not seen at [facility]. Left VM for Margean to return my call.

## 2024-11-05 NOTE — Telephone Encounter (Signed)
 Copied from CRM [id number]. Topic: Referral - Question >> Nov 05, 2024 10:48 AM Emylou G wrote: Reason for CRM: Margean w/[facility] .SABRA Looking for referral to be uploaded to the [facility] portal.. so that she can schedule the appt.. Insurance is asking for this - she is not a new patient.. The doctors name: Dr. Mai.  The diagnosis code: M05.79  [phone] if you need her.SABRA
